# Patient Record
Sex: Female | Born: 1960
Health system: Southern US, Community
[De-identification: ages and names within clinical notes are randomized; demographics above are authoritative.]

## PROBLEM LIST (undated history)

## (undated) DIAGNOSIS — R079 Chest pain, unspecified: Secondary | ICD-10-CM

## (undated) DIAGNOSIS — M329 Systemic lupus erythematosus, unspecified: Secondary | ICD-10-CM

## (undated) DIAGNOSIS — R1310 Dysphagia, unspecified: Secondary | ICD-10-CM

## (undated) DIAGNOSIS — R51 Headache: Secondary | ICD-10-CM

## (undated) DIAGNOSIS — I10 Essential (primary) hypertension: Secondary | ICD-10-CM

## (undated) DIAGNOSIS — F419 Anxiety disorder, unspecified: Secondary | ICD-10-CM

## (undated) DIAGNOSIS — M1990 Unspecified osteoarthritis, unspecified site: Secondary | ICD-10-CM

## (undated) DIAGNOSIS — M7989 Other specified soft tissue disorders: Secondary | ICD-10-CM

## (undated) DIAGNOSIS — G459 Transient cerebral ischemic attack, unspecified: Secondary | ICD-10-CM

## (undated) DIAGNOSIS — R7303 Prediabetes: Secondary | ICD-10-CM

## (undated) DIAGNOSIS — F32A Depression, unspecified: Secondary | ICD-10-CM

## (undated) DIAGNOSIS — R0602 Shortness of breath: Secondary | ICD-10-CM

## (undated) DIAGNOSIS — K5792 Diverticulitis of intestine, part unspecified, without perforation or abscess without bleeding: Secondary | ICD-10-CM

## (undated) DIAGNOSIS — K5900 Constipation, unspecified: Secondary | ICD-10-CM

## (undated) DIAGNOSIS — M797 Fibromyalgia: Secondary | ICD-10-CM

## (undated) DIAGNOSIS — Z8719 Personal history of other diseases of the digestive system: Secondary | ICD-10-CM

## (undated) DIAGNOSIS — K0889 Other specified disorders of teeth and supporting structures: Secondary | ICD-10-CM

## (undated) DIAGNOSIS — R519 Headache, unspecified: Secondary | ICD-10-CM

## (undated) DIAGNOSIS — Z860100 Personal history of colon polyps, unspecified: Secondary | ICD-10-CM

## (undated) DIAGNOSIS — E7401 von Gierke disease: Secondary | ICD-10-CM

## (undated) DIAGNOSIS — M2550 Pain in unspecified joint: Secondary | ICD-10-CM

## (undated) DIAGNOSIS — D75A Glucose-6-phosphate dehydrogenase (G6PD) deficiency without anemia: Secondary | ICD-10-CM

## (undated) DIAGNOSIS — M069 Rheumatoid arthritis, unspecified: Secondary | ICD-10-CM

## (undated) DIAGNOSIS — E7800 Pure hypercholesterolemia, unspecified: Secondary | ICD-10-CM

## (undated) HISTORY — DX: Constipation, unspecified: K59.00

## (undated) HISTORY — DX: Other specified soft tissue disorders: M79.89

## (undated) HISTORY — DX: Systemic lupus erythematosus, unspecified: M32.9

## (undated) HISTORY — DX: Dysphagia, unspecified: R13.10

## (undated) HISTORY — DX: Transient cerebral ischemic attack, unspecified: G45.9

## (undated) HISTORY — DX: Pure hypercholesterolemia, unspecified: E78.00

## (undated) HISTORY — DX: Unspecified osteoarthritis, unspecified site: M19.90

## (undated) HISTORY — PX: COLONOSCOPY: SHX174

## (undated) HISTORY — DX: Prediabetes: R73.03

## (undated) HISTORY — DX: Glucose-6-phosphate dehydrogenase (G6PD) deficiency without anemia: D75.A

## (undated) HISTORY — DX: Personal history of other diseases of the digestive system: Z87.19

## (undated) HISTORY — DX: Diverticulitis of intestine, part unspecified, without perforation or abscess without bleeding: K57.92

## (undated) HISTORY — DX: Depression, unspecified: F32.A

## (undated) HISTORY — DX: Von Gierke disease: E74.01

## (undated) HISTORY — DX: Anxiety disorder, unspecified: F41.9

## (undated) HISTORY — DX: Essential (primary) hypertension: I10

## (undated) HISTORY — DX: Chest pain, unspecified: R07.9

## (undated) HISTORY — DX: Pain in unspecified joint: M25.50

## (undated) HISTORY — DX: Other specified disorders of teeth and supporting structures: K08.89

## (undated) HISTORY — DX: Shortness of breath: R06.02

## (undated) HISTORY — DX: Personal history of colon polyps, unspecified: Z86.0100

## (undated) HISTORY — DX: Personal history of colonic polyps: Z86.010

---

## 1993-04-30 HISTORY — PX: TUBAL LIGATION: SHX77

## 1996-04-30 DIAGNOSIS — M329 Systemic lupus erythematosus, unspecified: Secondary | ICD-10-CM

## 1996-04-30 HISTORY — DX: Systemic lupus erythematosus, unspecified: M32.9

## 1997-09-03 ENCOUNTER — Emergency Department (HOSPITAL_COMMUNITY): Admission: EM | Admit: 1997-09-03 | Discharge: 1997-09-03 | Payer: Self-pay | Admitting: *Deleted

## 1997-12-02 ENCOUNTER — Ambulatory Visit (HOSPITAL_COMMUNITY): Admission: RE | Admit: 1997-12-02 | Discharge: 1997-12-02 | Payer: Self-pay | Admitting: *Deleted

## 1998-06-28 ENCOUNTER — Other Ambulatory Visit: Admission: RE | Admit: 1998-06-28 | Discharge: 1998-06-28 | Payer: Self-pay | Admitting: *Deleted

## 1998-07-13 ENCOUNTER — Ambulatory Visit (HOSPITAL_COMMUNITY): Admission: RE | Admit: 1998-07-13 | Discharge: 1998-07-13 | Payer: Self-pay | Admitting: *Deleted

## 1998-11-17 ENCOUNTER — Ambulatory Visit (HOSPITAL_COMMUNITY): Admission: RE | Admit: 1998-11-17 | Discharge: 1998-11-17 | Payer: Self-pay | Admitting: *Deleted

## 1999-06-21 ENCOUNTER — Other Ambulatory Visit: Admission: RE | Admit: 1999-06-21 | Discharge: 1999-06-21 | Payer: Self-pay | Admitting: *Deleted

## 2004-08-16 ENCOUNTER — Other Ambulatory Visit: Admission: RE | Admit: 2004-08-16 | Discharge: 2004-08-16 | Payer: Self-pay | Admitting: Family Medicine

## 2004-09-03 ENCOUNTER — Emergency Department (HOSPITAL_COMMUNITY): Admission: EM | Admit: 2004-09-03 | Discharge: 2004-09-03 | Payer: Self-pay | Admitting: Emergency Medicine

## 2007-12-24 ENCOUNTER — Ambulatory Visit: Payer: Self-pay | Admitting: Internal Medicine

## 2007-12-24 LAB — CONVERTED CEMR LAB
ALT: 9 units/L (ref 0–35)
AST: 13 units/L (ref 0–37)
Albumin: 4.1 g/dL (ref 3.5–5.2)
Alkaline Phosphatase: 39 units/L (ref 39–117)
BUN: 16 mg/dL (ref 6–23)
Basophils Absolute: 0 10*3/uL (ref 0.0–0.1)
Basophils Relative: 0 % (ref 0–1)
CO2: 23 meq/L (ref 19–32)
Calcium: 9.2 mg/dL (ref 8.4–10.5)
Chloride: 104 meq/L (ref 96–112)
Cholesterol: 170 mg/dL (ref 0–200)
Creatinine, Ser: 0.86 mg/dL (ref 0.40–1.20)
Eosinophils Absolute: 0.1 10*3/uL (ref 0.0–0.7)
Eosinophils Relative: 1 % (ref 0–5)
Glucose, Bld: 88 mg/dL (ref 70–99)
HCT: 35.1 % — ABNORMAL LOW (ref 36.0–46.0)
HDL: 44 mg/dL (ref >39)
Hemoglobin: 10.5 g/dL — ABNORMAL LOW (ref 12.0–15.0)
LDL Cholesterol: 108 mg/dL — ABNORMAL HIGH (ref 0–99)
Lymphocytes Relative: 34 % (ref 12–46)
Lymphs Abs: 1.4 10*3/uL (ref 0.7–4.0)
MCHC: 29.9 g/dL — ABNORMAL LOW (ref 30.0–36.0)
MCV: 82.8 fL (ref 78.0–100.0)
Monocytes Absolute: 0.4 10*3/uL (ref 0.1–1.0)
Monocytes Relative: 8 % (ref 3–12)
Neutro Abs: 2.4 10*3/uL (ref 1.7–7.7)
Neutrophils Relative %: 57 % (ref 43–77)
Platelets: 336 10*3/uL (ref 150–400)
Potassium: 3.6 meq/L (ref 3.5–5.3)
RBC: 4.24 M/uL (ref 3.87–5.11)
RDW: 14.6 % (ref 11.5–15.5)
Sodium: 138 meq/L (ref 135–145)
TSH: 0.92 microintl units/mL (ref 0.350–4.50)
Total Bilirubin: 0.3 mg/dL (ref 0.3–1.2)
Total CHOL/HDL Ratio: 3.9
Total Protein: 8.2 g/dL (ref 6.0–8.3)
Triglycerides: 91 mg/dL (ref <150)
VLDL: 18 mg/dL (ref 0–40)
WBC: 4.2 10*3/uL (ref 4.0–10.5)

## 2008-02-03 ENCOUNTER — Ambulatory Visit: Payer: Self-pay | Admitting: Internal Medicine

## 2008-02-03 LAB — CONVERTED CEMR LAB
Ferritin: 21 ng/mL (ref 10–291)
Folate: 7.8 ng/mL
Iron: 22 ug/dL — ABNORMAL LOW (ref 42–145)
Saturation Ratios: 7 % — ABNORMAL LOW (ref 20–55)
TIBC: 301 ug/dL (ref 250–470)
UIBC: 279 ug/dL
Vitamin B-12: 327 pg/mL (ref 211–911)

## 2008-05-12 ENCOUNTER — Emergency Department (HOSPITAL_COMMUNITY): Admission: EM | Admit: 2008-05-12 | Discharge: 2008-05-12 | Payer: Self-pay | Admitting: Emergency Medicine

## 2008-05-17 ENCOUNTER — Observation Stay (HOSPITAL_COMMUNITY): Admission: EM | Admit: 2008-05-17 | Discharge: 2008-05-19 | Payer: Self-pay | Admitting: Emergency Medicine

## 2008-05-20 ENCOUNTER — Encounter: Payer: Self-pay | Admitting: Internal Medicine

## 2008-05-20 ENCOUNTER — Ambulatory Visit: Payer: Self-pay | Admitting: *Deleted

## 2008-05-20 ENCOUNTER — Ambulatory Visit: Payer: Self-pay | Admitting: Internal Medicine

## 2008-05-20 LAB — CONVERTED CEMR LAB
Chlamydia, DNA Probe: NEGATIVE
GC Probe Amp, Genital: NEGATIVE

## 2008-05-27 ENCOUNTER — Ambulatory Visit: Payer: Self-pay | Admitting: Internal Medicine

## 2008-06-02 ENCOUNTER — Ambulatory Visit: Payer: Self-pay | Admitting: Obstetrics and Gynecology

## 2008-06-11 ENCOUNTER — Ambulatory Visit: Payer: Self-pay | Admitting: Obstetrics and Gynecology

## 2008-06-22 ENCOUNTER — Ambulatory Visit: Payer: Self-pay | Admitting: Internal Medicine

## 2008-06-30 ENCOUNTER — Ambulatory Visit: Payer: Self-pay | Admitting: Obstetrics and Gynecology

## 2008-07-15 ENCOUNTER — Ambulatory Visit: Payer: Self-pay | Admitting: Internal Medicine

## 2008-07-21 ENCOUNTER — Ambulatory Visit (HOSPITAL_COMMUNITY): Admission: RE | Admit: 2008-07-21 | Discharge: 2008-07-21 | Payer: Self-pay | Admitting: Internal Medicine

## 2008-07-22 ENCOUNTER — Ambulatory Visit: Payer: Self-pay | Admitting: Obstetrics and Gynecology

## 2008-09-08 ENCOUNTER — Ambulatory Visit: Payer: Self-pay | Admitting: Family Medicine

## 2008-09-22 ENCOUNTER — Ambulatory Visit: Payer: Self-pay | Admitting: Internal Medicine

## 2008-09-28 HISTORY — PX: ABDOMINAL HYSTERECTOMY: SHX81

## 2008-09-29 ENCOUNTER — Ambulatory Visit: Payer: Self-pay | Admitting: Obstetrics & Gynecology

## 2008-10-06 ENCOUNTER — Ambulatory Visit: Payer: Self-pay | Admitting: Internal Medicine

## 2008-10-12 ENCOUNTER — Ambulatory Visit: Payer: Self-pay | Admitting: Obstetrics & Gynecology

## 2008-10-12 ENCOUNTER — Inpatient Hospital Stay (HOSPITAL_COMMUNITY): Admission: RE | Admit: 2008-10-12 | Discharge: 2008-10-14 | Payer: Self-pay | Admitting: Obstetrics & Gynecology

## 2008-10-12 ENCOUNTER — Encounter: Payer: Self-pay | Admitting: Obstetrics & Gynecology

## 2008-10-20 ENCOUNTER — Ambulatory Visit: Payer: Self-pay | Admitting: Obstetrics & Gynecology

## 2008-11-17 ENCOUNTER — Ambulatory Visit: Payer: Self-pay | Admitting: Obstetrics & Gynecology

## 2009-02-09 ENCOUNTER — Ambulatory Visit: Payer: Self-pay | Admitting: Internal Medicine

## 2009-02-09 LAB — CONVERTED CEMR LAB
BUN: 14 mg/dL (ref 6–23)
Basophils Absolute: 0 10*3/uL (ref 0.0–0.1)
Basophils Relative: 0 % (ref 0–1)
CO2: 24 meq/L (ref 19–32)
Calcium: 9.1 mg/dL (ref 8.4–10.5)
Chloride: 99 meq/L (ref 96–112)
Creatinine, Ser: 0.89 mg/dL (ref 0.40–1.20)
Eosinophils Absolute: 0.1 10*3/uL (ref 0.0–0.7)
Eosinophils Relative: 1 % (ref 0–5)
Glucose, Bld: 110 mg/dL — ABNORMAL HIGH (ref 70–99)
HCT: 39.1 % (ref 36.0–46.0)
Hemoglobin: 12.4 g/dL (ref 12.0–15.0)
Lymphocytes Relative: 32 % (ref 12–46)
Lymphs Abs: 1.9 10*3/uL (ref 0.7–4.0)
MCHC: 31.7 g/dL (ref 30.0–36.0)
MCV: 82.5 fL (ref 78.0–100.0)
Monocytes Absolute: 0.6 10*3/uL (ref 0.1–1.0)
Monocytes Relative: 9 % (ref 3–12)
Neutro Abs: 3.5 10*3/uL (ref 1.7–7.7)
Neutrophils Relative %: 58 % (ref 43–77)
Platelets: 292 10*3/uL (ref 150–400)
Potassium: 3.4 meq/L — ABNORMAL LOW (ref 3.5–5.3)
RBC: 4.74 M/uL (ref 3.87–5.11)
RDW: 14.8 % (ref 11.5–15.5)
Sodium: 137 meq/L (ref 135–145)
WBC: 6.1 10*3/uL (ref 4.0–10.5)

## 2009-02-11 ENCOUNTER — Encounter (INDEPENDENT_AMBULATORY_CARE_PROVIDER_SITE_OTHER): Payer: Self-pay | Admitting: Internal Medicine

## 2009-02-11 LAB — CONVERTED CEMR LAB: Hgb A1c MFr Bld: 6.2 % — ABNORMAL HIGH (ref 4.6–6.1)

## 2009-06-29 ENCOUNTER — Ambulatory Visit: Payer: Self-pay | Admitting: Internal Medicine

## 2009-07-26 ENCOUNTER — Ambulatory Visit: Payer: Self-pay | Admitting: Internal Medicine

## 2009-07-29 ENCOUNTER — Ambulatory Visit: Payer: Self-pay | Admitting: Internal Medicine

## 2009-11-28 ENCOUNTER — Encounter (INDEPENDENT_AMBULATORY_CARE_PROVIDER_SITE_OTHER): Payer: Self-pay | Admitting: Family Medicine

## 2009-11-28 ENCOUNTER — Ambulatory Visit: Payer: Self-pay | Admitting: Internal Medicine

## 2009-11-28 LAB — CONVERTED CEMR LAB: Microalb, Ur: 1.27 mg/dL (ref 0.00–1.89)

## 2010-01-30 ENCOUNTER — Encounter (INDEPENDENT_AMBULATORY_CARE_PROVIDER_SITE_OTHER): Payer: Self-pay | Admitting: Internal Medicine

## 2010-01-30 LAB — CONVERTED CEMR LAB
ALT: 12 units/L (ref 0–35)
AST: 14 units/L (ref 0–37)
Albumin: 4.1 g/dL (ref 3.5–5.2)
Alkaline Phosphatase: 36 units/L — ABNORMAL LOW (ref 39–117)
BUN: 13 mg/dL (ref 6–23)
Basophils Absolute: 0 10*3/uL (ref 0.0–0.1)
Basophils Relative: 1 % (ref 0–1)
CO2: 26 meq/L (ref 19–32)
Calcium: 9.4 mg/dL (ref 8.4–10.5)
Chloride: 101 meq/L (ref 96–112)
Creatinine, Ser: 0.86 mg/dL (ref 0.40–1.20)
Eosinophils Absolute: 0 10*3/uL (ref 0.0–0.7)
Eosinophils Relative: 1 % (ref 0–5)
Glucose, Bld: 91 mg/dL (ref 70–99)
HCT: 40 % (ref 36.0–46.0)
Hemoglobin: 13.2 g/dL (ref 12.0–15.0)
Hgb A1c MFr Bld: 5.9 % — ABNORMAL HIGH (ref <5.7)
Lymphocytes Relative: 32 % (ref 12–46)
Lymphs Abs: 1.4 10*3/uL (ref 0.7–4.0)
MCHC: 33 g/dL (ref 30.0–36.0)
MCV: 86.2 fL (ref 78.0–100.0)
Monocytes Absolute: 0.4 10*3/uL (ref 0.1–1.0)
Monocytes Relative: 8 % (ref 3–12)
Neutro Abs: 2.6 10*3/uL (ref 1.7–7.7)
Neutrophils Relative %: 59 % (ref 43–77)
Platelets: 332 10*3/uL (ref 150–400)
Potassium: 3.9 meq/L (ref 3.5–5.3)
RBC: 4.64 M/uL (ref 3.87–5.11)
RDW: 13.6 % (ref 11.5–15.5)
Sodium: 138 meq/L (ref 135–145)
TSH: 0.866 microintl units/mL (ref 0.350–4.500)
Total Bilirubin: 0.5 mg/dL (ref 0.3–1.2)
Total Protein: 7.6 g/dL (ref 6.0–8.3)
WBC: 4.3 10*3/uL (ref 4.0–10.5)

## 2010-04-26 ENCOUNTER — Encounter (INDEPENDENT_AMBULATORY_CARE_PROVIDER_SITE_OTHER): Payer: Self-pay | Admitting: Family Medicine

## 2010-04-26 LAB — CONVERTED CEMR LAB
HCT: 40.5 % (ref 36.0–46.0)
Hemoglobin: 13.4 g/dL (ref 12.0–15.0)
MCHC: 33.1 g/dL (ref 30.0–36.0)
MCV: 84.2 fL (ref 78.0–100.0)
Platelets: 394 10*3/uL (ref 150–400)
RBC: 4.81 M/uL (ref 3.87–5.11)
RDW: 12.8 % (ref 11.5–15.5)
WBC: 5.7 10*3/uL (ref 4.0–10.5)

## 2010-08-07 LAB — CBC
HCT: 23.3 % — ABNORMAL LOW (ref 36.0–46.0)
HCT: 23.4 % — ABNORMAL LOW (ref 36.0–46.0)
HCT: 25.1 % — ABNORMAL LOW (ref 36.0–46.0)
HCT: 36.5 % (ref 36.0–46.0)
Hemoglobin: 8.2 g/dL — ABNORMAL LOW (ref 12.0–15.0)
Hemoglobin: 12.6 g/dL (ref 12.0–15.0)
Hemoglobin: 8.1 g/dL — ABNORMAL LOW (ref 12.0–15.0)
Hemoglobin: 8.6 g/dL — ABNORMAL LOW (ref 12.0–15.0)
MCHC: 35.2 g/dL (ref 30.0–36.0)
MCHC: 34.3 g/dL (ref 30.0–36.0)
MCHC: 34.5 g/dL (ref 30.0–36.0)
MCHC: 34.5 g/dL (ref 30.0–36.0)
MCV: 85.8 fL (ref 78.0–100.0)
MCV: 85.4 fL (ref 78.0–100.0)
MCV: 86 fL (ref 78.0–100.0)
MCV: 86.6 fL (ref 78.0–100.0)
Platelets: 210 10*3/uL (ref 150–400)
Platelets: 188 10*3/uL (ref 150–400)
Platelets: 221 10*3/uL (ref 150–400)
Platelets: 250 10*3/uL (ref 150–400)
RBC: 2.72 MIL/uL — ABNORMAL LOW (ref 3.87–5.11)
RBC: 2.72 MIL/uL — ABNORMAL LOW (ref 3.87–5.11)
RBC: 2.91 MIL/uL — ABNORMAL LOW (ref 3.87–5.11)
RBC: 4.28 MIL/uL (ref 3.87–5.11)
RDW: 14.8 % (ref 11.5–15.5)
RDW: 14.7 % (ref 11.5–15.5)
RDW: 14.8 % (ref 11.5–15.5)
RDW: 14.9 % (ref 11.5–15.5)
WBC: 14.1 10*3/uL — ABNORMAL HIGH (ref 4.0–10.5)
WBC: 21.4 10*3/uL — ABNORMAL HIGH (ref 4.0–10.5)
WBC: 5.1 10*3/uL (ref 4.0–10.5)
WBC: 8.4 10*3/uL (ref 4.0–10.5)

## 2010-08-07 LAB — CROSSMATCH
ABO/RH(D): A POS
Antibody Screen: NEGATIVE

## 2010-08-07 LAB — BASIC METABOLIC PANEL
BUN: 9 mg/dL (ref 6–23)
CO2: 27 mEq/L (ref 19–32)
Calcium: 9.3 mg/dL (ref 8.4–10.5)
Chloride: 103 mEq/L (ref 96–112)
Creatinine, Ser: 0.9 mg/dL (ref 0.4–1.2)
GFR calc Af Amer: >60 mL/min (ref >60)
GFR calc non Af Amer: >60 mL/min (ref >60)
Glucose, Bld: 119 mg/dL — ABNORMAL HIGH (ref 70–99)
Potassium: 3.3 mEq/L — ABNORMAL LOW (ref 3.5–5.1)
Sodium: 136 mEq/L (ref 135–145)

## 2010-08-07 LAB — ABO/RH: ABO/RH(D): A POS

## 2010-08-07 LAB — PREGNANCY, URINE: Preg Test, Ur: NEGATIVE

## 2010-08-14 LAB — IRON AND TIBC
Iron: 114 ug/dL (ref 42–135)
Iron: 89 ug/dL (ref 42–135)
Saturation Ratios: 29 % (ref 20–55)
Saturation Ratios: 38 % (ref 20–55)
TIBC: 300 ug/dL (ref 250–470)
TIBC: 310 ug/dL (ref 250–470)
UIBC: 186 ug/dL
UIBC: 221 ug/dL

## 2010-08-14 LAB — CROSSMATCH
ABO/RH(D): A POS
Antibody Screen: NEGATIVE

## 2010-08-14 LAB — CBC
HCT: 29.5 % — ABNORMAL LOW (ref 36.0–46.0)
HCT: 23.1 % — ABNORMAL LOW (ref 36.0–46.0)
HCT: 25.9 % — ABNORMAL LOW (ref 36.0–46.0)
HCT: 28.6 % — ABNORMAL LOW (ref 36.0–46.0)
HCT: 30.9 % — ABNORMAL LOW (ref 36.0–46.0)
Hemoglobin: 9.5 g/dL — ABNORMAL LOW (ref 12.0–15.0)
Hemoglobin: 10 g/dL — ABNORMAL LOW (ref 12.0–15.0)
Hemoglobin: 7.4 g/dL — CL (ref 12.0–15.0)
Hemoglobin: 8.6 g/dL — ABNORMAL LOW (ref 12.0–15.0)
Hemoglobin: 9.4 g/dL — ABNORMAL LOW (ref 12.0–15.0)
MCHC: 32.1 g/dL (ref 30.0–36.0)
MCHC: 32.2 g/dL (ref 30.0–36.0)
MCHC: 32.5 g/dL (ref 30.0–36.0)
MCHC: 32.7 g/dL (ref 30.0–36.0)
MCHC: 33.3 g/dL (ref 30.0–36.0)
MCV: 84.1 fL (ref 78.0–100.0)
MCV: 85.5 fL (ref 78.0–100.0)
MCV: 86 fL (ref 78.0–100.0)
MCV: 87.3 fL (ref 78.0–100.0)
MCV: 87.7 fL (ref 78.0–100.0)
Platelets: 318 10*3/uL (ref 150–400)
Platelets: 364 10*3/uL (ref 150–400)
Platelets: 364 10*3/uL (ref 150–400)
Platelets: 382 10*3/uL (ref 150–400)
Platelets: 395 10*3/uL (ref 150–400)
RBC: 3.51 MIL/uL — ABNORMAL LOW (ref 3.87–5.11)
RBC: 2.71 MIL/uL — ABNORMAL LOW (ref 3.87–5.11)
RBC: 3.01 MIL/uL — ABNORMAL LOW (ref 3.87–5.11)
RBC: 3.26 MIL/uL — ABNORMAL LOW (ref 3.87–5.11)
RBC: 3.54 MIL/uL — ABNORMAL LOW (ref 3.87–5.11)
RDW: 14.4 % (ref 11.5–15.5)
RDW: 15 % (ref 11.5–15.5)
RDW: 15 % (ref 11.5–15.5)
RDW: 15.3 % (ref 11.5–15.5)
RDW: 15.4 % (ref 11.5–15.5)
WBC: 7.4 10*3/uL (ref 4.0–10.5)
WBC: 6.3 10*3/uL (ref 4.0–10.5)
WBC: 7.4 10*3/uL (ref 4.0–10.5)
WBC: 7.8 10*3/uL (ref 4.0–10.5)
WBC: 7.9 10*3/uL (ref 4.0–10.5)

## 2010-08-14 LAB — BASIC METABOLIC PANEL
BUN: 7 mg/dL (ref 6–23)
BUN: 9 mg/dL (ref 6–23)
CO2: 23 mEq/L (ref 19–32)
CO2: 26 mEq/L (ref 19–32)
Calcium: 8.6 mg/dL (ref 8.4–10.5)
Calcium: 8.8 mg/dL (ref 8.4–10.5)
Chloride: 106 mEq/L (ref 96–112)
Chloride: 108 mEq/L (ref 96–112)
Creatinine, Ser: 0.83 mg/dL (ref 0.4–1.2)
Creatinine, Ser: 0.9 mg/dL (ref 0.4–1.2)
GFR calc Af Amer: >60 mL/min (ref >60)
GFR calc Af Amer: >60 mL/min (ref >60)
GFR calc non Af Amer: >60 mL/min (ref >60)
GFR calc non Af Amer: >60 mL/min (ref >60)
Glucose, Bld: 92 mg/dL (ref 70–99)
Glucose, Bld: 96 mg/dL (ref 70–99)
Potassium: 3.6 mEq/L (ref 3.5–5.1)
Potassium: 3.7 mEq/L (ref 3.5–5.1)
Sodium: 137 mEq/L (ref 135–145)
Sodium: 137 mEq/L (ref 135–145)

## 2010-08-14 LAB — DIFFERENTIAL
Basophils Absolute: 0 10*3/uL (ref 0.0–0.1)
Basophils Relative: 0 % (ref 0–1)
Eosinophils Absolute: 0 10*3/uL (ref 0.0–0.7)
Eosinophils Relative: 0 % (ref 0–5)
Lymphocytes Relative: 13 % (ref 12–46)
Lymphs Abs: 0.9 10*3/uL (ref 0.7–4.0)
Monocytes Absolute: 0.3 10*3/uL (ref 0.1–1.0)
Monocytes Relative: 4 % (ref 3–12)
Neutro Abs: 6.2 10*3/uL (ref 1.7–7.7)
Neutrophils Relative %: 83 % — ABNORMAL HIGH (ref 43–77)

## 2010-08-14 LAB — COMPREHENSIVE METABOLIC PANEL
ALT: 11 U/L (ref 0–35)
AST: 20 U/L (ref 0–37)
Albumin: 3.1 g/dL — ABNORMAL LOW (ref 3.5–5.2)
Alkaline Phosphatase: 33 U/L — ABNORMAL LOW (ref 39–117)
BUN: 8 mg/dL (ref 6–23)
CO2: 25 mEq/L (ref 19–32)
Calcium: 8.5 mg/dL (ref 8.4–10.5)
Chloride: 105 mEq/L (ref 96–112)
Creatinine, Ser: 0.86 mg/dL (ref 0.4–1.2)
GFR calc Af Amer: >60 mL/min (ref >60)
GFR calc non Af Amer: >60 mL/min (ref >60)
Glucose, Bld: 111 mg/dL — ABNORMAL HIGH (ref 70–99)
Potassium: 2.9 mEq/L — ABNORMAL LOW (ref 3.5–5.1)
Sodium: 138 mEq/L (ref 135–145)
Total Bilirubin: 0.7 mg/dL (ref 0.3–1.2)
Total Protein: 6.7 g/dL (ref 6.0–8.3)

## 2010-08-14 LAB — RETICULOCYTES
RBC.: 2.78 MIL/uL — ABNORMAL LOW (ref 3.87–5.11)
RBC.: 3.17 MIL/uL — ABNORMAL LOW (ref 3.87–5.11)
Retic Count, Absolute: 152.2 10*3/uL (ref 19.0–186.0)
Retic Count, Absolute: 158.5 10*3/uL (ref 19.0–186.0)
Retic Ct Pct: 4.8 % — ABNORMAL HIGH (ref 0.4–3.1)
Retic Ct Pct: 5.7 % — ABNORMAL HIGH (ref 0.4–3.1)

## 2010-08-14 LAB — HEMOCCULT GUIAC POC 1CARD (OFFICE): Fecal Occult Bld: NEGATIVE

## 2010-08-14 LAB — FOLATE
Folate: 7.1 ng/mL
Folate: 7.3 ng/mL

## 2010-08-14 LAB — GLUCOSE, CAPILLARY: Glucose-Capillary: 101 mg/dL — ABNORMAL HIGH (ref 70–99)

## 2010-08-14 LAB — FERRITIN
Ferritin: 19 ng/mL (ref 10–291)
Ferritin: 20 ng/mL (ref 10–291)

## 2010-08-14 LAB — VITAMIN B12
Vitamin B-12: 311 pg/mL (ref 211–911)
Vitamin B-12: 339 pg/mL (ref 211–911)

## 2010-08-14 LAB — SAMPLE TO BLOOD BANK

## 2010-08-14 LAB — PROTIME-INR
INR: 1.1 (ref 0.00–1.49)
Prothrombin Time: 14.1 seconds (ref 11.6–15.2)

## 2010-08-14 LAB — APTT: aPTT: 29 seconds (ref 24–37)

## 2010-08-14 LAB — ABO/RH: ABO/RH(D): A POS

## 2010-08-14 LAB — POCT PREGNANCY, URINE: Preg Test, Ur: NEGATIVE

## 2010-08-14 LAB — MAGNESIUM: Magnesium: 2.2 mg/dL (ref 1.5–2.5)

## 2010-08-15 LAB — POCT PREGNANCY, URINE
Preg Test, Ur: NEGATIVE
Preg Test, Ur: NEGATIVE

## 2010-09-12 NOTE — Discharge Summary (Signed)
NAMEYOLTZIN, RANSOM NO.:  [id number]   MEDICAL RECORD NO.:  [id number]          PATIENT TYPE:  INP   LOCATION:  9302                          [facility]:  [facility]   PHYSICIAN:  Scheryl Darter, MD       DATE OF BIRTH:  January 13, 1961   DATE OF ADMISSION:  10/12/2008  DATE OF DISCHARGE:  10/14/2008                               DISCHARGE SUMMARY   DIAGNOSIS:  Symptomatic fibroid uterus.   PROCEDURE:  On October 12, 2008, was total abdominal hysterectomy.   The patient is a 50 year old black female gravida 2, para 2.  Last  menstrual period in January 2010, and history of heavy periods lasting  up to 10 days.  She had an episode of severe anemia and she was admitted  on  May 18, 2008, at [facility] with heavy  bleeding and low hemoglobin.  She was transfused 3 units of packed red  blood cells and was discharged with a hemoglobin of 9.  Ultrasound  confirmed a fibroid uterus.  There is no history of hypertension.   PAST SURGICAL HISTORY:  Two cesarean sections and tubal ligation.   ALLERGIES:  No known drug allergies.   MEDICATIONS:  Lisinopril 20 mg and hydrochlorothiazide 25 mg 1 p.o.  daily, Toprol-XL 25 mg a day, Tiazac 360 mg a day, Provera 5 mg a day.  The patient received Lupron Depot 11.25 mg in February 2010.   FAMILY HISTORY:  Diabetes and hypertension.   SOCIAL HISTORY:  The patient is married, does not smoke, and denies  alcohol, tobacco, or drug use.   REVIEW OF SYSTEMS:  No vaginal bleeding at admission.   PHYSICAL EXAMINATION:  VITAL SIGNS:  Weight 220 pounds, blood pressure  145/95.  CHEST:  Clear.  HEART:  Regular rate and rhythm.  ABDOMEN:  Soft, nontender, no mass.  PELVIC:  About a 12 to 14 week size uterus.  Ultrasound showed large  uterus 15.9 x 7.4 x 9.2 cm with multiple leiomyoma.   The patient underwent total abdominal hysterectomy on October 12, 2008.  She had estimated blood loss of about 1500 mL.  Her hemoglobin postop  was stable at 8.2 and she required no transfusions.  Vital signs were  stable throughout her recovery.  She was afebrile.  She was advancing  her diet without problems.  The patient was discharged home on second  postoperative day.  She was continued with her medications from prior to  admission.  She will take Slow FE  1 p.o. daily.  She had a prescription for Percocet 5/325 30 tablets 1-2  p.o. q.4-6 hours p.r.n. pain.  She was instructed on pelvic rest, no  heavy lifting.  She will follow up a week after discharge for removal of  her staples at the [facility].  The patient was not to drive for  about 2 weeks.      Scheryl Darter, MD  Electronically Signed     JA/MEDQ  D:  10/14/2008  T:  10/14/2008  Job:  [id number]

## 2010-09-12 NOTE — H&P (Signed)
NAMEVESTAL, DELVAL NO.:  [id number]   MEDICAL RECORD NO.:  [id number]          PATIENT TYPE:  INP   LOCATION:  2008                         FACILITY:  [facility]   PHYSICIAN:  Michiel Cowboy, MDDATE OF BIRTH:  [birth year]   DATE OF ADMISSION:  05/17/2008  DATE OF DISCHARGE:                              HISTORY & PHYSICAL   PRIMARY CARE Yuriana Gaal:  Gretta Arab. Valentina Lucks, M.D.   CHIEF COMPLAINT:  Generalized weakness and tiredness.   HISTORY OF THE PRESENT ILLNESS:  The patient is a 50 year old female who  was recently seen in the emergency department secondary syncope about  five days ago.  At that time her hemoglobin was around 9.5.  She  presents again today with generalized weakness and lightheadedness and  her hemoglobin was down to 7.4.  The patient endorses a recent vaginal  heavy bleeding and states this has slowed down.  In the past she  reported heavy blood clots an d stats she needed to wear up to three  pads at a time to prevent the bleeding from soaking through her clothes.  Now she is just spotting; this has been going on for quite some time, I  think maybe months.  She does not have an Chief Financial Officer doctor.  She has been  seeing Dr. Maurice Small for about a year.  She does take an iron  supplement, but is not on medications.   PAST MEDICAL HISTORY:  The past medical history is unremarkable, except  for recent near syncope, which is reportedly secondary to symptomatic  anemia.   SOCIAL HISTORY:  The patient does not smoke or drink alcohol.  She lives  at home.   FAMILY HISTORY:  No history bleeding disorders in the family.   MEDICATIONS:  Ferrous sulfate once a day 325 mg.   ALLERGIES:  No known drug allergies.   PHYSICAL EXAMINATION:  VITAL SIGNS:  Temperature 97.7, blood pressure  149/124, pulse 57, respirations 18, and satting 100% on room air.  GENERAL APPEARANCE:  The appears to be in no acute distress.  HEENT:  Head is atraumatic, except for  a healing scratch on her  forehead.  Moist mucous membranes, but somewhat pale.  LUNGS:  The lungs are clear to auscultation bilaterally.  Somewhat  distant.  HEART:  The heart has a regular rate and rhythm.  No murmurs, rubs or  gallops.  ABD MEN:  The abdomen is obese, but nontender and nondistended.  EXTREMITIES:  The lower extremities are without clubbing, cyanosis or  edema.  NEUROLOGIC EXAMINATION:  The patient is neurologically intact.   LABORATORY DATA:  White blood cell count 7.4, hemoglobin 7.4 and  platelets 395,000.  Coags and CMP not obtained. Hemoccult negative from  below.  CT scan of the head today showed no intracranial process.   ASSESSMENT AND PLAN:  This is a 50 year old female with symptomatic  anemia likely related to her vaginal bleeding.   1. Anemia.  We will transfuse 2 units of packed red blood cells and      repeat complete blood count thereafter.  We will try to  add on an      anemia panel.  The patient will need an OB-Gyn follow up.  We will      obtain a vaginal ultrasound.  We will obtain coagulation studies      and comprehensive metabolic profile to evaluate for any other      potential causes for underlying bleeding disorders, but, I doubt      and just the main cause is just vaginal bleeding.  She is hemoccult      negative times one, but we will cycle times three.  2. Prophylaxes.  Protonix plus sequential compressive devices.      Michiel Cowboy, MD  Electronically Signed     AVD/MEDQ  D:  05/17/2008  T:  05/18/2008  Job:  [id number]   cc:   Gretta Arab. Valentina Lucks, M.D.

## 2010-09-12 NOTE — H&P (Signed)
NAMELINDALOU, MASLOSKI NO.:  [id number]   MEDICAL RECORD NO.:  [id number]          PATIENT TYPE:  INP   LOCATION:  9302                          [facility]   PHYSICIAN:  Scheryl Darter, MD       DATE OF BIRTH:  11-13-1960   DATE OF ADMISSION:  10/12/2008  DATE OF DISCHARGE:                              HISTORY & PHYSICAL   CHIEF COMPLAINT:  Heavy periods.   DIAGNOSIS:  Fibroid uterus.   PLANNED PROCEDURE:  Total abdominal hysterectomy.   HISTORY OF PRESENT ILLNESS:  The patient is a 50 year old black female  gravida 2, para 2, last menstrual period on January 2010 who has had  heavy periods that last about 10 days and her cycle is about 21 days.  On May 18, 2008, she was admitted to [facility] due to being weak  and having heavy menstrual bleeding.  She was transfused 3 units of  blood secondary to anemia.  When she was discharged, her hemoglobin was  about 9.  It had been as low as 7.2.  She was referred to Gynecologic  Clinic.  Ultrasound confirmed a fibroid uterus.   PAST MEDICAL HISTORY:  Hypertension.   PAST SURGICAL HISTORY:  Two cesarean sections and tubal ligation.   ALLERGIES:  No known drug allergies.   MEDICATIONS:  1. Lisinopril/hydrochlorothiazide 20/25 one p.o. daily.  2. Toprol-XL 25 mg a day.  3. Tiazac 360 mg a day.  4. Provera 5 mg a day.  5. The patient received Lupron Depot 11.25 mg in February 2010.   FAMILY HISTORY:  Diabetes and hypertension.   SOCIAL HISTORY:  The patient is married and she is nonsmoker and she  denies alcohol or drug use.   REVIEW OF SYSTEMS:  No bleeding since January 2010.  She has had some  vasomotor symptoms.   PHYSICAL EXAMINATION:  GENERAL:  The patient in no acute distress.  Mood  and affect is normal.  VITAL SIGNS:  Weight is 220 pounds, blood pressure 145/95, afebrile.  CHEST:  Clear.  HEART:  Regular rate and rhythm.  ABDOMEN:  Soft and nontender.  No mass.  PELVIC:  Done on June 02, 2008, showed normal external genitalia and  vagina.  Uterus about 12-14 week size.   Ultrasound on May 18, 2008, showed a large uterus 15.9 x 7.4 x 9.2  cm, multiple intramural uterine leiomyomata.  Two cysts within the left  ovary measuring up to 1.2 cm, right ovary has cyst measuring 1.8 x 1.5 x  2 cm.   IMPRESSION:  Symptomatic fibroid uterus, history of menorrhagia and  anemia.   PLAN:  The patient is scheduled for total abdominal hysterectomy.  Procedure was explained.  The risks of anesthesia, complications,  bleeding, infection, bowel, urinary tract damage discussed.  We also  discussed preservation of her ovaries versus oophorectomy, and she would  like to have her ovaries preserved unless there is indication for  removal at the time of surgery.  Questions were answered.      Scheryl Darter, MD  Electronically Signed     JA/MEDQ  D:  10/12/2008  T:  10/13/2008  Job:  [id number]

## 2010-09-12 NOTE — Group Therapy Note (Signed)
NAMEMarland [name]  CHIANNE, BYRNS NO.:  [id number]   MEDICAL RECORD NO.:  [id number]          PATIENT TYPE:  WOC   LOCATION:  [facility]                   FACILITY:  [facility]   PHYSICIAN:  Scheryl Darter, MD       DATE OF BIRTH:  1960-08-01   DATE OF SERVICE:  07/22/2008                                  CLINIC NOTE   The patient has done well after receiving Lupron Depo due to fibroid  uterus and anemia.  Her last bleeding was at the beginning of February.  She now also takes Provera 5 mg a day and has helped to the decrease  vasomotor symptoms.  She feels ready to schedule total abdominal  hysterectomy.  Discussed the surgery and the risks of bleeding,  infection, bowel or urinary tract damage, anesthesia complications.  Questions were answered.   CURRENT MEDICATIONS:  1. Lisinopril/hydrochlorothiazide 20/25 one p.o. daily.  2. Toprol-XL 25 mg a day.  3. Hydrochlorothiazide 25 mg a day.  4. Feosol 325 mg a day.  5. Stool softener once a day.  6. __________ 120 mg a day.  7. Provera 5 mg a day.   She has no known drug allergies, and she is not allergic to Latex.   REVIEW OF SYSTEMS:  No bleeding.  No complaints of pain.  She had a cold  starting about 2 weeks ago and still has some chest congestion.  No  fevers.   PHYSICAL EXAMINATION:  CHEST:  Is clear.  HEART:  Regular rhythm.  ABDOMEN:  Is moderately obese, soft, nontender, well healed, transverse  lower abdominal incision below her pannus.  PELVIC EXAM:  Was deferred today.   IMPRESSION:  1. Fibroid uterus.  2. Menorrhagia.  3. Anemia.   PLAN:  Total abdominal hysterectomy.     Scheryl Darter, MD    JA/MEDQ  D:  07/22/2008  T:  07/22/2008  Job:  [id number]

## 2010-09-12 NOTE — Group Therapy Note (Signed)
NAME:  Veronica [name], VINER NO.:  [id number]   MEDICAL RECORD NO.:  [id number]          PATIENT TYPE:  WOC   LOCATION:  [facility]                   FACILITY[facility]  [facility]   PHYSICIAN:  Scheryl Darter, MD       DATE OF BIRTH:  1960/10/19   DATE OF SERVICE:                                  CLINIC NOTE   CHIEF COMPLAINT:  Heavy periods.   Patient is a 50 year old black female, gravida 2, para 2.  Last  menstrual period May 10, 2008 which was heavy and lasted 10 days.  She says she has heavy periods for about 5 days of the cycle and then  her periods usually last about 10 days.  She usually has a period every  month but occasionally she has 21-day cycles.  She was admitted at [facility] on May 18, 2008 when she was brought by EMS due to being weak  and having heavy menstrual bleeding.  She was transfused 3 units of  blood secondary to anemia.  She says when she was discharged her  hemoglobin was about 9.  No bleeding now.  She was told she had a  fibroid uterus.   PAST MEDICAL HISTORY:  Hypertension.   PAST SURGICAL HISTORY:  Two  cesarean sections.  Tubal ligation.'   No known drug allergies.   MEDICATIONS:  1. Lisinopril/hydrochlorothiazide 20/25 one p.o. daily.  2. Toprol XL 25 mg a day.  3. Hydrochlorothiazide 25 mg a day.  4. FerrouSul 325 mg a day.  5. Stool softener 1 a day.  6. Tiazole 120 mg a day.   FAMILY HISTORY:  Diabetes and hypertension.  Patient is a nonsmoker.  She denies alcohol and drug use.   PHYSICAL EXAMINATION:  Patient in no acute distress.  Weight is 220  pounds.  Blood pressure 134/84, pulse 80, temperature 98.7.  ABDOMEN:  Obese, soft, nontender.  PELVIC EXAM:  External genitalia, vagina, cervix normal.  Uterus is  about 12- to 14-weeks size with no discrete adnexal masses.  Ultrasound  done May 18, 2008 showed enlarged uterus 15.9 x 7.4 x 9.2 with  multiple intramural uterine leiomyomata, the largest measuring 4.6 x 5.2  x 4.4  cm.  The endometrial density cannot be assessed because of this.  With 2 cysts within the left ovary measuring up to 1.2 cm.  The right  ovary had a cyst measuring 1.8 x 1.5 x 2.0 cm.   IMPRESSION:  Symptomatic uterine fibroids with menorrhagia and anemia.   PLAN:  I discussed hormonal management versus surgical management.  Because of the size of her uterus and nature of the fibroids, I have  recommended total abdominal hysterectomy.  We discussed possible removal  of her ovaries and she would like to spare her ovaries at the time of  surgery.  Discussed the procedure.  Because of her anemia, she will  receive Lupron Depo 11.25 mg IM preop and she will continue with her  iron supplement.  She will return to clinic in 4 weeks.      Scheryl Darter, MD     JA/MEDQ  D:  06/02/2008  T:  06/02/2008  Job:  [id number]

## 2010-09-12 NOTE — Op Note (Signed)
Veronica [name], Veronica [name] NO.:  [id number]   MEDICAL RECORD NO.:  [id number]          PATIENT TYPE:  INP   LOCATION:  9302                          [facility]ILITY[facility]   PHYSICIAN:  Scheryl Darter, MD       DATE OF BIRTH:  [birth year]   DATE OF PROCEDURE:  10/12/2008  DATE OF DISCHARGE:                               OPERATIVE REPORT   PROCEDURE:  Total abdominal hysterectomy.   PREOPERATIVE DIAGNOSIS:  Symptomatic fibroid uterus.   POSTOPERATIVE DIAGNOSIS:  Symptomatic fibroid uterus.   SURGEON:  Scheryl Darter, MD   ASSISTANT:  Javier Glazier. Rose, MD   ANESTHESIA:  General.   ESTIMATED BLOOD LOSS:  1500 mL.   SPECIMEN:  Uterus.   COMPLICATIONS:  Interrupted blood loss.   DRAINS:  Foley catheter.   COUNTS:  Correct.   OPERATIVE COURSE:  The patient gave written consent for total abdominal  hysterectomy due to symptomatic fibroid uterus.  The patient  identification was confirmed.  She was brought to the OR and general  anesthesia was induced.  She was placed in dorsal supine position.  Exam  revealed about a 10-12 weeks' size uterus.  Abdomen was sterilely  prepped and draped.  Foley catheter was placed.  A #10 blade was used to  make a Pfannenstiel incision inside her previous cesarean section scars.  Incision was carried down to the fascia and fascia was incised.  The  incision was extended transversely with curved Mayo scissors.  There was  considerable amount of scarring in the fascial layer.  This was taken  down with blunt sharp dissection.  The peritoneum was entered and  incision was extended vertically.  There was adhesions of the omentum to  anterior abdominal wall.  This was taken down with sharp dissection with  cautery and some larger adhesions were cross-clamped with Kelly clamps,  cut and ligated with 0 Vicryl.  The adhesions also extended into the  pelvis.  Abdominal cavity was then explored and there were no upper  abdominal masses.  Uterus appeared  to be irregular, consistent with  fibroids and there were two subserosal fibroids seen.  Both ovaries  appeared normal.  The bowel was packed back with moist lap pads and a  self-retaining retractor was placed.  Uterus was elevated by grasping  with towel clips.  The round ligaments were identified and clamped with  Kelly clamps and cut and suture ligated.  The anterior leaf of the broad  ligament was opened across the anterior uterus with Metzenbaum scissors.  Adhesions of the bladder to the anterior uterus were taken down with  blunt and sharp dissection.  The posterior leaf of broad ligament was  entered and Kelly clamps were placed distal to the ovaries across the  adnexal pedicles.  Next, pedicles were cut and double-suture ligatures  were placed.  Good hemostasis seen.  Bladder was further dissected off  the uterus and uterine vessels were skeletonized.  The uterine vessels  were double clamped with Heaney clamps and cut and suture ligated.  There was some bleeding on the left side and this  was controlled by  fully taking down the uterine vessels on the left and suture ligating  them with double ligatures.  There was a fair significant blood loss  during this time.  The ureters were palpated and were out of the  surgical field.  Once hemostasis was controlled, hemoglobin was  obtained, it was 8.1.  Estimated blood loss at that point was about 1200  mL.  The patient was typed and crossmatched for 2 units of blood, but  she did not receive this during the procedure.  She remained  hemodynamically stable with fluid resuscitation during the procedure.  The uterus was amputated above the cervix using the knife and scissors  in order to obtain better visualization of the vessels.  The cardinal  ligaments were clamped, cut, and suture ligated.  The bladder was slowly  reflected away from the cervix.  As her cervix was deep in the pelvis,  the cervix was cored out using scissors.  Clamps  were then placed distal  to the remaining cervix and cervix was removed and suture ligatures were  placed at the vaginal angles.  The vagina was closed with running  locking suture with 0 Vicryl.  Hemostasis was obtained at the cuff with  0 Vicryl.  The hemostasis was assured and pelvis was irrigated.  All  packs and retractors were removed.  Anterior peritoneum was closed with  a running suture of 2-0 Vicryl.  Fascia was closed with running suture  with 0 Vicryl.  Hemostasis was assured.  Interrupted 0-Vicryl sutures  were placed in Scarpa fascia to close the subcutaneous layer.  Skin was  closed with staples.  The skin was irrigated prior to closure.  The  hemostasis was seen in the incision.  Sterile dressing was applied.  The  patient tolerated the procedure well.  She was stable at the end of  procedure and she was brought to the recovery room.  A CBC was ordered  in the recovery room which is currently pending.  Estimated blood loss  was 1500 mL.  She had good urine output about 350 mL.      Scheryl Darter, MD  Electronically Signed     JA/MEDQ  D:  10/12/2008  T:  10/13/2008  Job:  [id number]

## 2010-09-12 NOTE — Group Therapy Note (Signed)
NAMEJERNEY, BAKSH NO.:  [id number]   MEDICAL RECORD NO.:  [id number]          PATIENT TYPE:  WOC   LOCATION:  [facility]                   FACILITY[facility]  [facility]   PHYSICIAN:  Allie Bossier, MD        DATE OF BIRTH:  22-Apr-1961   DATE OF SERVICE:  09/29/2008                                  CLINIC NOTE   Ms. Baptista is a 50 year old lady who has come in to discuss her  upcoming abdominal hysterectomy.  Dr. Debroah Loop has been seeing this  patient for heavy periods and she was recently given a Depo-Lupron  injection on Sep 08, 2008.  Her abdominal hysterectomy is scheduled for  October 12, 2008 and she had several questions.  Her first question is that  of will the incision be vertical or transverse.  Since I am not doing  the surgery I an unable to answer that.  Her next question was that  about her ovaries.  She said that she is currently having hot flashes  but is uncertain as to whether she would like to have her ovaries out.  I have discussed the pros and cons of removing ovaries versus leaving  them in situ and she will give Dr. Debroah Loop her preference on the day of  surgery.  All of the questions were answered.  I did explain the risks  of surgery.  Odetta Pink, MD     MCD/MEDQ  D:  09/29/2008  T:  09/29/2008  Job:  [id number]

## 2010-12-12 ENCOUNTER — Ambulatory Visit (HOSPITAL_COMMUNITY)
Admission: RE | Admit: 2010-12-12 | Discharge: 2010-12-12 | Disposition: A | Discharge status: Home / Self Care | Payer: Self-pay | Source: Ambulatory Visit | Attending: Internal Medicine | Admitting: Internal Medicine

## 2010-12-12 ENCOUNTER — Other Ambulatory Visit (HOSPITAL_COMMUNITY): Payer: Self-pay | Admitting: Internal Medicine

## 2010-12-12 DIAGNOSIS — I10 Essential (primary) hypertension: Secondary | ICD-10-CM | POA: Insufficient documentation

## 2010-12-12 DIAGNOSIS — R42 Dizziness and giddiness: Secondary | ICD-10-CM | POA: Insufficient documentation

## 2010-12-12 DIAGNOSIS — I517 Cardiomegaly: Secondary | ICD-10-CM | POA: Insufficient documentation

## 2010-12-12 DIAGNOSIS — R079 Chest pain, unspecified: Secondary | ICD-10-CM | POA: Insufficient documentation

## 2011-04-03 ENCOUNTER — Ambulatory Visit (HOSPITAL_BASED_OUTPATIENT_CLINIC_OR_DEPARTMENT_OTHER): Payer: Self-pay | Attending: Family Medicine | Admitting: Radiology

## 2011-04-03 VITALS — Ht 64.0 in | Wt 234.0 lb

## 2011-04-03 DIAGNOSIS — I4949 Other premature depolarization: Secondary | ICD-10-CM | POA: Insufficient documentation

## 2011-04-03 DIAGNOSIS — G4710 Hypersomnia, unspecified: Secondary | ICD-10-CM | POA: Insufficient documentation

## 2011-04-03 DIAGNOSIS — G4730 Sleep apnea, unspecified: Secondary | ICD-10-CM

## 2011-04-07 DIAGNOSIS — I4949 Other premature depolarization: Secondary | ICD-10-CM | POA: Diagnosis not present

## 2011-04-07 DIAGNOSIS — G4730 Sleep apnea, unspecified: Secondary | ICD-10-CM | POA: Diagnosis not present

## 2011-04-07 DIAGNOSIS — G4710 Hypersomnia, unspecified: Secondary | ICD-10-CM | POA: Diagnosis not present

## 2011-04-07 NOTE — Procedures (Signed)
Veronica [name], Veronica [name] NO.:  [id number]  MEDICAL RECORD NO.:  [id number]          PATIENT TYPE:  OUT  LOCATION:  SLEEP CENTER                 FACILITY[facility]  [facility]  PHYSICIAN:  Leslyn Monda D. Maple Hudson, MD, FCCP, FACPDATE OF BIRTH:  02-[birth year]-63  DATE OF STUDY:  04/03/2011                           NOCTURNAL POLYSOMNOGRAM  REFERRING PHYSICIAN:  Norberto Sorenson, MD  REFERRING PHYSICIAN:  Norberto Sorenson, MD  INDICATION FOR STUDY:  Hypersomnia with sleep apnea.  EPWORTH SLEEPINESS SCORE:  6/24.  BMI 40.2, weight 234 pounds, height 64 inches, neck 15.5 inches.  MEDICATIONS:  Home medications are charted and reviewed.  SLEEP ARCHITECTURE:  Total sleep time 286 minutes with sleep efficiency 79.2%.  Stage I 11%, stage II 64.7%, stage III absent, REM 24.3% of total sleep time.  Sleep latency 25.5 minutes, REM latency 75 minutes, awake after sleep onset 48.5 minutes, arousal index 11.1.  Bedtime medication:  None.  RESPIRATORY DATA:  Apnea/hypopnea index (AHI) 2.5 per hour.  A total of 12 events were scored, all as hypoxemia.  Events were non-positional. REM AHI 7.8 per hour.  There were insufficient numbers of events to qualify for split protocol CPAP titration on this study night.  OXYGEN DATA:  Mild intermittent snoring with oxygen desaturation to a nadir of 90% and a mean oxygen saturation through the study of 95.9% on room air.  CARDIAC DATA:  Sinus rhythm with occasional PVC.  MOVEMENT-PARASOMNIA:  A few limb jerks were noted with insignificant sleep disturbance.  Bathroom x1.  IMPRESSIONS-RECOMMENDATIONS: 1. Sleep architecture was within normal adult limits in an unfamiliar     environment, with some sleep fragmentation and brief waking noted. 2. Occasional respiratory events with sleep disturbance, within normal     limits.  AHI 2.5 per hour (the normal     range for adults is between 0 and 5 events per hour).  Mild     intermittent snoring with oxygen desaturation to a nadir  of 90% and     a mean oxygen saturation through the study of 95.9% on room air.     Savon Bordonaro D. Maple Hudson, MD, [facility], FACP Diplomate, Biomedical engineer of Sleep Medicine Electronically Signed    CDY/MEDQ  D:  04/07/2011 10:36:51  T:  04/07/2011 11:47:51  Job:  [id number]

## 2011-07-10 ENCOUNTER — Other Ambulatory Visit (HOSPITAL_COMMUNITY): Payer: Self-pay | Admitting: Family Medicine

## 2011-07-10 DIAGNOSIS — R519 Headache, unspecified: Secondary | ICD-10-CM

## 2011-07-12 ENCOUNTER — Ambulatory Visit (HOSPITAL_COMMUNITY)
Admission: RE | Admit: 2011-07-12 | Discharge: 2011-07-12 | Disposition: A | Discharge status: Home / Self Care | Payer: Self-pay | Source: Ambulatory Visit | Attending: Family Medicine | Admitting: Family Medicine

## 2011-07-12 DIAGNOSIS — R51 Headache: Secondary | ICD-10-CM | POA: Insufficient documentation

## 2011-07-12 DIAGNOSIS — R519 Headache, unspecified: Secondary | ICD-10-CM

## 2012-04-10 ENCOUNTER — Encounter: Payer: Self-pay | Admitting: Family Medicine

## 2012-04-10 ENCOUNTER — Ambulatory Visit (INDEPENDENT_AMBULATORY_CARE_PROVIDER_SITE_OTHER): Payer: No Typology Code available for payment source | Admitting: Family Medicine

## 2012-04-10 VITALS — BP 136/79 | HR 69 | Temp 98.8°F | Ht 64.0 in | Wt 219.8 lb

## 2012-04-10 DIAGNOSIS — I10 Essential (primary) hypertension: Secondary | ICD-10-CM | POA: Insufficient documentation

## 2012-04-10 DIAGNOSIS — M329 Systemic lupus erythematosus, unspecified: Secondary | ICD-10-CM | POA: Diagnosis not present

## 2012-04-10 MED ORDER — AMLODIPINE BESYLATE 10 MG PO TABS: 10.0000 mg | ORAL_TABLET | Freq: Every day | ORAL | Status: DC

## 2012-04-10 MED ORDER — LISINOPRIL-HYDROCHLOROTHIAZIDE 20-12.5 MG PO TABS: 1.0000 | ORAL_TABLET | Freq: Every day | ORAL | Status: DC

## 2012-04-10 MED ORDER — VERAPAMIL HCL ER 180 MG PO CP24: 180.0000 mg | ORAL_CAPSULE | Freq: Every day | ORAL | Status: DC

## 2012-04-10 MED ORDER — POTASSIUM CHLORIDE ER 10 MEQ PO TBCR: 20.0000 meq | EXTENDED_RELEASE_TABLET | Freq: Two times a day (BID) | ORAL | Status: DC

## 2012-04-10 MED ORDER — TOPIRAMATE 50 MG PO TABS: 50.0000 mg | ORAL_TABLET | Freq: Two times a day (BID) | ORAL | Status: DC

## 2012-04-10 NOTE — Progress Notes (Addendum)
Subjective:    Patient ID: Veronica [name], female    DOB: 07/08/1960, 51 y.o.   MRN: [id number]  HPI  Patient presents to clinic to establish care.  Former Information systems manager patient.  Awaiting orange card.  Hypertension: patient was diagnosed with HTN several years ago and has been taking combination therapy.  She took all medications today and BP at goal.  She is requesting refills on some medications that are about to run out.  Patient denies any HA, numbness/tingling of extremities, weakness, or LE edema.  Denies any CP.  She admits to not exercising very much, but is motivated to start walking more.  She does not adhere to a low sodium diet.   I have reviewed patient's PMH, SH, FH, surgical Hx, Social Hx, Medications, Allergies, and Problem List today.  Review of Systems  Per HPI    Objective:   Physical Exam  Constitutional: She appears well-nourished. No distress.       obese  Pulmonary/Chest: Effort normal.  Musculoskeletal: She exhibits no edema.      Assessment & Plan:

## 2012-04-10 NOTE — Assessment & Plan Note (Addendum)
Will refill Lisinopril-HCTZ today.  Continue current regimen.  BP at goal.  Return to clinic after patient receives orange card to discuss other chronic issues.  Patient agrees with plan.

## 2012-05-02 ENCOUNTER — Telehealth: Payer: Self-pay | Admitting: *Deleted

## 2012-05-02 NOTE — Telephone Encounter (Signed)
Received a fax from pharmacy regarding refill on Verapamil.  Patient told the pharmacist that the sig should be one tablet BID.  Please clarify.

## 2012-05-06 NOTE — Telephone Encounter (Signed)
Verapamil is a 24 hour capsule so it should be taken once per day.  If patient has a cardiologist, then she should ask them for their opinion.  Otherwise, she can schedule an appointment with me at her earliest convenience.  Thanks.

## 2012-05-06 NOTE — Telephone Encounter (Signed)
Called pharmacist and clarified that medication is be be taken QD.  They will notify patient.

## 2012-05-06 NOTE — Telephone Encounter (Signed)
Called pt to ensure that she got the message from pharmacy about the way that she should be taking her meds. ONCE DAILY.Marland KitchenLoralee Pacas Grabill

## 2012-05-20 ENCOUNTER — Other Ambulatory Visit: Payer: Self-pay | Admitting: *Deleted

## 2012-05-20 MED ORDER — TOPIRAMATE 50 MG PO TABS: 50.0000 mg | ORAL_TABLET | Freq: Two times a day (BID) | ORAL | Status: DC

## 2012-06-02 ENCOUNTER — Encounter (HOSPITAL_COMMUNITY): Payer: Self-pay | Admitting: *Deleted

## 2012-06-02 DIAGNOSIS — Z872 Personal history of diseases of the skin and subcutaneous tissue: Secondary | ICD-10-CM | POA: Insufficient documentation

## 2012-06-02 DIAGNOSIS — K8020 Calculus of gallbladder without cholecystitis without obstruction: Secondary | ICD-10-CM | POA: Insufficient documentation

## 2012-06-02 DIAGNOSIS — Z90710 Acquired absence of both cervix and uterus: Secondary | ICD-10-CM | POA: Insufficient documentation

## 2012-06-02 DIAGNOSIS — R6883 Chills (without fever): Secondary | ICD-10-CM | POA: Insufficient documentation

## 2012-06-02 DIAGNOSIS — I10 Essential (primary) hypertension: Secondary | ICD-10-CM | POA: Insufficient documentation

## 2012-06-02 DIAGNOSIS — Z79899 Other long term (current) drug therapy: Secondary | ICD-10-CM | POA: Insufficient documentation

## 2012-06-02 DIAGNOSIS — R112 Nausea with vomiting, unspecified: Secondary | ICD-10-CM | POA: Insufficient documentation

## 2012-06-02 DIAGNOSIS — R197 Diarrhea, unspecified: Secondary | ICD-10-CM | POA: Insufficient documentation

## 2012-06-02 NOTE — ED Notes (Addendum)
C/o abd pain, also chills & nv. Last emesis PTA. Last BM PTA (loose). Last ate 1630. Tried pepto bismol at ~ 2230 w/o relief, (denies: fever, bleeding, back pain, dizziness or other sx). No aggravating factors. No aleviating factors. Pinpoints pain to RUQ.

## 2012-06-03 ENCOUNTER — Encounter (HOSPITAL_COMMUNITY): Payer: Self-pay | Admitting: Emergency Medicine

## 2012-06-03 ENCOUNTER — Emergency Department (HOSPITAL_COMMUNITY)
Admission: EM | Admit: 2012-06-03 | Discharge: 2012-06-03 | Disposition: A | Discharge status: Home / Self Care | Payer: Medicaid Other | Attending: Emergency Medicine | Admitting: Emergency Medicine

## 2012-06-03 ENCOUNTER — Emergency Department (HOSPITAL_COMMUNITY): Payer: Medicaid Other

## 2012-06-03 DIAGNOSIS — R197 Diarrhea, unspecified: Secondary | ICD-10-CM

## 2012-06-03 DIAGNOSIS — K8050 Calculus of bile duct without cholangitis or cholecystitis without obstruction: Secondary | ICD-10-CM

## 2012-06-03 LAB — CBC WITH DIFFERENTIAL/PLATELET
Basophils Absolute: 0 10*3/uL (ref 0.0–0.1)
Basophils Relative: 0 % (ref 0–1)
Eosinophils Absolute: 0.1 10*3/uL (ref 0.0–0.7)
Eosinophils Relative: 1 % (ref 0–5)
HCT: 38.2 % (ref 36.0–46.0)
Hemoglobin: 12.5 g/dL (ref 12.0–15.0)
Lymphocytes Relative: 18 % (ref 12–46)
Lymphs Abs: 1.5 10*3/uL (ref 0.7–4.0)
MCH: 28 pg (ref 26.0–34.0)
MCHC: 32.7 g/dL (ref 30.0–36.0)
MCV: 85.7 fL (ref 78.0–100.0)
Monocytes Absolute: 0.4 10*3/uL (ref 0.1–1.0)
Monocytes Relative: 5 % (ref 3–12)
Neutro Abs: 6.6 10*3/uL (ref 1.7–7.7)
Neutrophils Relative %: 77 % (ref 43–77)
Platelets: 298 10*3/uL (ref 150–400)
RBC: 4.46 MIL/uL (ref 3.87–5.11)
RDW: 13.5 % (ref 11.5–15.5)
WBC: 8.6 10*3/uL (ref 4.0–10.5)

## 2012-06-03 LAB — URINE MICROSCOPIC-ADD ON

## 2012-06-03 LAB — URINALYSIS, ROUTINE W REFLEX MICROSCOPIC
Bilirubin Urine: NEGATIVE
Glucose, UA: NEGATIVE mg/dL
Hgb urine dipstick: NEGATIVE
Ketones, ur: NEGATIVE mg/dL
Leukocytes, UA: NEGATIVE
Nitrite: NEGATIVE
Protein, ur: 30 mg/dL — AB
Specific Gravity, Urine: 1.027 (ref 1.005–1.030)
Urobilinogen, UA: 0.2 mg/dL (ref 0.0–1.0)
pH: 5.5 (ref 5.0–8.0)

## 2012-06-03 LAB — COMPREHENSIVE METABOLIC PANEL WITH GFR
ALT: 11 U/L (ref 0–35)
AST: 14 U/L (ref 0–37)
Albumin: 3.7 g/dL (ref 3.5–5.2)
Alkaline Phosphatase: 44 U/L (ref 39–117)
BUN: 11 mg/dL (ref 6–23)
CO2: 24 meq/L (ref 19–32)
Calcium: 9.3 mg/dL (ref 8.4–10.5)
Chloride: 105 meq/L (ref 96–112)
Creatinine, Ser: 0.85 mg/dL (ref 0.50–1.10)
GFR calc Af Amer: >90 mL/min
GFR calc non Af Amer: 79 mL/min — ABNORMAL LOW
Glucose, Bld: 126 mg/dL — ABNORMAL HIGH (ref 70–99)
Potassium: 3.4 meq/L — ABNORMAL LOW (ref 3.5–5.1)
Sodium: 140 meq/L (ref 135–145)
Total Bilirubin: 0.2 mg/dL — ABNORMAL LOW (ref 0.3–1.2)
Total Protein: 8.3 g/dL (ref 6.0–8.3)

## 2012-06-03 LAB — LIPASE, BLOOD: Lipase: 33 U/L (ref 11–59)

## 2012-06-03 MED ORDER — FENTANYL CITRATE 0.05 MG/ML IJ SOLN
50.0000 ug | Freq: Once | INTRAMUSCULAR | Status: AC
  Administered 2012-06-03: 50 ug via INTRAMUSCULAR

## 2012-06-03 MED ORDER — ONDANSETRON 4 MG PO TBDP
8.0000 mg | ORAL_TABLET | Freq: Once | ORAL | Status: AC
  Administered 2012-06-03: 8 mg via ORAL
  Filled 2012-06-03: qty 2

## 2012-06-03 MED ORDER — FENTANYL CITRATE 0.05 MG/ML IJ SOLN
50.0000 ug | Freq: Once | INTRAMUSCULAR | Status: DC
  Filled 2012-06-03: qty 2

## 2012-06-03 MED ORDER — OXYCODONE-ACETAMINOPHEN 5-325 MG PO TABS: 1.0000 | ORAL_TABLET | Freq: Four times a day (QID) | ORAL | Status: DC | PRN

## 2012-06-03 MED ORDER — FENTANYL CITRATE 0.05 MG/ML IJ SOLN
50.0000 ug | Freq: Once | INTRAMUSCULAR | Status: AC
  Administered 2012-06-03: 50 ug via INTRAMUSCULAR
  Filled 2012-06-03: qty 2

## 2012-06-03 MED ORDER — CIPROFLOXACIN HCL 500 MG PO TABS: 500.0000 mg | ORAL_TABLET | Freq: Two times a day (BID) | ORAL | Status: DC

## 2012-06-03 NOTE — ED Notes (Signed)
Care transferred and report given to [facility], [place]

## 2012-06-03 NOTE — ED Provider Notes (Signed)
History     CSN: [id number]  Arrival date & time 06/02/12  2314   First MD Initiated Contact with Patient 06/03/12 0115      Chief Complaint  Patient presents with  . Abdominal Pain  . Chills  . Emesis    (Consider location/radiation/quality/duration/timing/severity/associated sxs/prior treatment) Patient is a 52 y.o. female presenting with abdominal pain and vomiting. The history is provided by the patient.  Abdominal Pain The primary symptoms of the illness include abdominal pain, nausea, vomiting and diarrhea. The primary symptoms of the illness do not include fever or shortness of breath. The current episode started more than 2 days ago. The onset of the illness was gradual. The problem has not changed since onset. The abdominal pain began more than 2 days ago. The pain came on gradually. The abdominal pain has been unchanged since its onset. The abdominal pain is located in the RUQ. The abdominal pain does not radiate. The abdominal pain is relieved by nothing. The abdominal pain is exacerbated by fatty foods and eating.  The vomiting began more than 2 days ago. Vomiting occurred once. The emesis contains stomach contents.  The diarrhea began 3 to 5 days ago. The diarrhea is watery. The diarrhea occurs 5 to 10 times per day. Risk factors for illness producing diarrhea include suspect food intake.  The patient states that she believes she is currently not pregnant. Symptoms associated with the illness do not include frequency. Significant associated medical issues do not include PUD.  Emesis  This is a new problem. The current episode started more than 2 days ago. Episode frequency: once per day. The problem has not changed since onset.The emesis has an appearance of stomach contents. There has been no fever. Associated symptoms include abdominal pain and diarrhea. Pertinent negatives include no fever.  After eating at at [employer] shrimp on friday  Past Medical History   Diagnosis Date  . Hypertension   . SLE (systemic lupus erythematosus) 1998    Past Surgical History  Procedure Date  . Abdominal hysterectomy 2010  . Cesarean section     1992, 1995    Family History  Problem Relation Age of Onset  . Diabetes Mother   . Hypertension Mother   . Hypertension Father   . Vision loss Sister     History  Substance Use Topics  . Smoking status: Never Smoker   . Smokeless tobacco: Never Used  . Alcohol Use: No    OB History    Grav Para Term Preterm Abortions TAB SAB Ect Mult Living                  Review of Systems  Constitutional: Negative for fever.  Respiratory: Negative for shortness of breath.   Gastrointestinal: Positive for nausea, vomiting, abdominal pain and diarrhea.  Genitourinary: Negative for frequency.  All other systems reviewed and are negative.    Allergies  Review of patient's allergies indicates no known allergies.  Home Medications   Current Outpatient Rx  Name  Route  Sig  Dispense  Refill  . AMLODIPINE BESYLATE 10 MG PO TABS   Oral   Take 1 tablet (10 mg total) by mouth daily.   30 tablet   2   . LISINOPRIL-HYDROCHLOROTHIAZIDE 20-12.5 MG PO TABS   Oral   Take 1 tablet by mouth daily.   30 tablet   3   . POTASSIUM CHLORIDE ER 10 MEQ PO TBCR   Oral   Take 2 tablets (  20 mEq total) by mouth 2 (two) times daily.   60 tablet   2   . TOPIRAMATE 50 MG PO TABS   Oral   Take 1 tablet (50 mg total) by mouth 2 (two) times daily.   30 tablet   1   . VERAPAMIL HCL ER 180 MG PO CP24   Oral   Take 1 capsule (180 mg total) by mouth at bedtime.   30 capsule   2     BP 114/71  Pulse 59  Temp 99.1 F (37.3 C) (Oral)  Resp 22  SpO2 96%  Physical Exam  Constitutional: She is oriented to person, place, and time. She appears well-developed and well-nourished. No distress.  HENT:  Head: Normocephalic and atraumatic.  Mouth/Throat: Oropharynx is clear and moist.  Eyes: Conjunctivae normal are  normal. Pupils are equal, round, and reactive to light.  Neck: Normal range of motion. Neck supple.  Cardiovascular: Normal rate, regular rhythm and intact distal pulses.   Pulmonary/Chest: Effort normal and breath sounds normal. She has no wheezes. She has no rales.  Abdominal: Soft. Bowel sounds are normal. She exhibits no distension. There is no tenderness. There is no rebound and no guarding.  Musculoskeletal: Normal range of motion.  Neurological: She is alert and oriented to person, place, and time.  Skin: Skin is warm and dry.  Psychiatric: She has a normal mood and affect.    ED Course  Procedures (including critical care time)  Labs Reviewed  COMPREHENSIVE METABOLIC PANEL - Abnormal; Notable for the following:    Potassium 3.4 (*)     Glucose, Bld 126 (*)     Total Bilirubin 0.2 (*)     GFR calc non Af Amer 79 (*)     All other components within normal limits  URINALYSIS, ROUTINE W REFLEX MICROSCOPIC - Abnormal; Notable for the following:    APPearance CLOUDY (*)     Protein, ur 30 (*)     All other components within normal limits  URINE MICROSCOPIC-ADD ON - Abnormal; Notable for the following:    Squamous Epithelial / LPF FEW (*)     Crystals CA OXALATE CRYSTALS (*)     All other components within normal limits  CBC WITH DIFFERENTIAL  LIPASE, BLOOD   US Abdomen Complete  06/03/2012  *RADIOLOGY REPORT*  Clinical Data:  Right upper quadrant pain.  COMPLETE ABDOMINAL ULTRASOUND  Comparison:  None.  Findings:  Gallbladder:  Multiple shadowing gallstones present with diffusely sludge filled gallbladder.  No gallbladder wall thickening. Murphy's sign is positive.  Findings are nonspecific but are consistent with acute cholecystitis in the appropriate clinical setting.  Common bile duct:  Extrahepatic bile duct diameter is just over upper limits of normal 88 mm.  The duct is only segmentally visualized and intraductal stones cannot be excluded.  Liver:  Somewhat limited  visualization of the liver due to rib shadowing.  Liver parenchymal pattern appears to be somewhat inhomogeneous which may represent focal areas of fatty infiltration.  No definite focal mass lesion.  IVC:  Appears normal.  Pancreas:  Visualized portions of the head and body of the pancreas are unremarkable.  Spleen:  Spleen length measures 8.1 cm.  Normal parenchymal echotexture.  Right Kidney:  Right kidney measures 10.4 cm in length.  No hydronephrosis.  14 mm hypoechoic focus in the lower pole likely representing a small cyst.  Left Kidney:  The left kidney measures 10.9 cm length.  No hydronephrosis.  Abdominal aorta:  Visualized portions of the abdominal aorta are normal in caliber.  Distal aorta is not well seen.  IMPRESSION: Stones and sludge in the gallbladder with positive Murphy's sign. Findings are nonspecific but are consistent with acute cholecystitis in the appropriate clinical setting.  Mild dilatation of extrahepatic bile ducts.  Bile duct stones are not excluded.   Original Report Authenticated By: Burman Nieves, M.D.      No diagnosis found.    MDM  Pain free post fentanyl  515 case d/w Dr. Donell Beers without lab abnormalities or gall bladder wall thickening there is no need for surgical intervention at this time especially as patient is pain free.  Cipro and meds to go and close follow up   All lab and US findings reviewed with patient and husband at length.  Patient continues to be pain free and is comfortable with outpatient management.  They are instructed to return immediately for worsening symptoms       Britton Perkinson K Taeko Schaffer-Rasch, MD 06/03/12 [phone]

## 2012-06-03 NOTE — ED Notes (Signed)
Pt states understanding of discharge instructions 

## 2012-06-19 ENCOUNTER — Other Ambulatory Visit: Payer: Self-pay | Admitting: *Deleted

## 2012-06-19 MED ORDER — TOPIRAMATE 50 MG PO TABS: 50.0000 mg | ORAL_TABLET | Freq: Two times a day (BID) | ORAL | Status: DC

## 2012-06-25 ENCOUNTER — Ambulatory Visit (INDEPENDENT_AMBULATORY_CARE_PROVIDER_SITE_OTHER): Payer: Self-pay | Admitting: General Surgery

## 2012-06-26 ENCOUNTER — Other Ambulatory Visit: Payer: Self-pay | Admitting: Family Medicine

## 2012-07-11 ENCOUNTER — Ambulatory Visit (INDEPENDENT_AMBULATORY_CARE_PROVIDER_SITE_OTHER): Payer: PRIVATE HEALTH INSURANCE | Admitting: General Surgery

## 2012-07-11 ENCOUNTER — Encounter (INDEPENDENT_AMBULATORY_CARE_PROVIDER_SITE_OTHER): Payer: Self-pay | Admitting: General Surgery

## 2012-07-11 VITALS — BP 112/70 | HR 72 | Resp 16 | Ht 64.0 in | Wt 204.0 lb

## 2012-07-11 DIAGNOSIS — K8020 Calculus of gallbladder without cholecystitis without obstruction: Secondary | ICD-10-CM

## 2012-07-11 NOTE — Patient Instructions (Signed)

## 2012-07-14 ENCOUNTER — Encounter (HOSPITAL_COMMUNITY): Payer: Self-pay | Admitting: Pharmacy Technician

## 2012-07-14 ENCOUNTER — Other Ambulatory Visit: Payer: Self-pay | Admitting: Family Medicine

## 2012-07-14 NOTE — Progress Notes (Signed)
Patient ID: Veronica [name], female   DOB: 1961-02-02, 52 y.o.   MRN: [id number]  Chief Complaint  Patient presents with  . Cholelithiasis    HPI Veronica [name] is a 52 y.o. female.   HPI 52 yo female referred by Dr Nicanor Alcon For evaluation of gallstone disease. The patient was in the emergency department on February 4 with a 1-2 day history of intermittent right upper quadrant pain. The patient states it was also associated with nausea and vomiting. She had an ultrasound which demonstrated cholelithiasis. Her labs were normal. And she was discharged on. She states that she has had some additional episode since that time. She Has noticed a correlation when eating greasy and fried foods as well as foods that contained mayonnaise. She denies any diarrhea or constipation. She denies any heavy NSAID use. She denies any acholic stools. She denies any fevers or chills. She denies any jaundice. She states that she has lost about 30 pounds over the past 6 months which has not been intentional. Past Medical History  Diagnosis Date  . Hypertension   . SLE (systemic lupus erythematosus) 1998    Past Surgical History  Procedure Laterality Date  . Abdominal hysterectomy  09/2008  . Cesarean section  1992, 1995    Family History  Problem Relation Age of Onset  . Diabetes Mother   . Multiple sclerosis Sister   . Hypertension Father   . Hypertension Brother     Social History History  Substance Use Topics  . Smoking status: Never Smoker   . Smokeless tobacco: Never Used  . Alcohol Use: No    No Known Allergies  Current Outpatient Prescriptions  Medication Sig Dispense Refill  . amLODipine (NORVASC) 10 MG tablet Take 1 tablet (10 mg total) by mouth daily.  30 tablet  2  . potassium chloride (K-DUR) 10 MEQ tablet Take 2 tablets (20 mEq total) by mouth 2 (two) times daily.  60 tablet  2  . topiramate (TOPAMAX) 50 MG tablet Take 1 tablet (50 mg total) by mouth 2 (two) times daily.  30 tablet   1  . verapamil (VERELAN PM) 180 MG 24 hr capsule Take 1 capsule (180 mg total) by mouth at bedtime.  30 capsule  2  . lisinopril-hydrochlorothiazide (PRINZIDE,ZESTORETIC) 20-12.5 MG per tablet Take 1 tablet by mouth daily.  31 tablet  0   No current facility-administered medications for this visit.    Review of Systems Review of Systems  Constitutional: Positive for fatigue and unexpected weight change (30 pounds after 6 mo). Negative for fever and chills.  HENT: Negative for hearing loss, congestion, sore throat, trouble swallowing and voice change.   Eyes: Negative for visual disturbance.  Respiratory: Negative for cough and wheezing.   Cardiovascular: Negative for chest pain, palpitations and leg swelling.  Gastrointestinal: Positive for nausea, vomiting and abdominal pain. Negative for diarrhea, constipation, blood in stool, abdominal distention and anal bleeding.  Genitourinary: Negative for hematuria, vaginal bleeding and difficulty urinating.  Musculoskeletal: Negative for arthralgias.       +jt pain  Skin: Negative for rash and wound.  Neurological: Positive for numbness (in hands and feet) and headaches. Negative for seizures and syncope.  Hematological: Negative for adenopathy. Does not bruise/bleed easily.  Psychiatric/Behavioral: Negative for confusion.    Blood pressure 112/70, pulse 72, resp. rate 16, height 5\' 4"  (1.626 m), weight 204 lb (92.534 kg).  Physical Exam Physical Exam  Vitals reviewed. Constitutional: She is oriented to person, place,  and time. She appears well-developed and well-nourished. No distress.  Obese   HENT:  Head: Normocephalic and atraumatic.  Right Ear: External ear normal.  Left Ear: External ear normal.  Eyes: Conjunctivae are normal. No scleral icterus.  Neck: Neck supple. No tracheal deviation present. No thyromegaly present.  Cardiovascular: Normal rate, regular rhythm and normal heart sounds.   Pulmonary/Chest: Effort normal and  breath sounds normal. No respiratory distress. She has no wheezes.  Abdominal: Soft. She exhibits no distension. There is no tenderness. There is no rebound and no guarding.  Musculoskeletal: She exhibits no edema.  Lymphadenopathy:    She has no cervical adenopathy.  Neurological: She is alert and oriented to person, place, and time.  Skin: Skin is warm and dry. No rash noted. She is not diaphoretic. No erythema.  Psychiatric: She has a normal mood and affect. Her behavior is normal. Judgment and thought content normal.    Data Reviewed ED note from 2/4 nml CMET except for K 3.4 nml CBC  COMPLETE ABDOMINAL ULTRASOUND 06/03/12 Comparison: None.  Findings:  Gallbladder: Multiple shadowing gallstones present with diffusely  sludge filled gallbladder. No gallbladder wall thickening.  Murphy's sign is positive. Findings are nonspecific but are  consistent with acute cholecystitis in the appropriate clinical  setting.  Common bile duct: Extrahepatic bile duct diameter is just over  upper limits of normal 88 mm. The duct is only segmentally  visualized and intraductal stones cannot be excluded.  Liver: Somewhat limited visualization of the liver due to rib  shadowing. Liver parenchymal pattern appears to be somewhat  inhomogeneous which may represent focal areas of fatty  infiltration. No definite focal mass lesion.  IVC: Appears normal.  Pancreas: Visualized portions of the head and body of the pancreas  are unremarkable.  Spleen: Spleen length measures 8.1 cm. Normal parenchymal  echotexture.  Right Kidney: Right kidney measures 10.4 cm in length. No  hydronephrosis. 14 mm hypoechoic focus in the lower pole likely  representing a small cyst.  Left Kidney: The left kidney measures 10.9 cm length. No  hydronephrosis.  Abdominal aorta: Visualized portions of the abdominal aorta are  normal in caliber. Distal aorta is not well seen.  IMPRESSION:  Stones and sludge in the gallbladder  with positive Murphy's sign.  Findings are nonspecific but are consistent with acute  cholecystitis in the appropriate clinical setting. Mild dilatation  of extrahepatic bile ducts. Bile duct stones are not excluded.   Assessment    Symptomatic cholelithiasis     Plan    I believe the patient's symptoms are consistent with gallbladder disease.  We discussed gallbladder disease. The patient was given Agricultural engineer. We discussed non-operative and operative management. We discussed the signs & symptoms of acute cholecystitis  I discussed laparoscopic cholecystectomy with IOC in detail.  The patient was given educational material as well as diagrams detailing the procedure.  We discussed the risks and benefits of a laparoscopic cholecystectomy including, but not limited to bleeding, infection, injury to surrounding structures such as the intestine or liver, bile leak, retained gallstones, need to convert to an open procedure, prolonged diarrhea, blood clots such as  DVT, common bile duct injury, anesthesia risks, and possible need for additional procedures.  We discussed the typical post-operative recovery course. I explained that the likelihood of improvement of their symptoms is good.  The patient will be scheduled for laparoscopic cholecystectomy in the near future  Petro Talent M. Andrey Campanile, MD, FACS General, Bariatric, & Minimally [facility] [place]          [facility] M 07/14/2012, 1:28 PM

## 2012-07-16 ENCOUNTER — Encounter (HOSPITAL_COMMUNITY)
Admission: RE | Admit: 2012-07-16 | Discharge: 2012-07-16 | Disposition: A | Discharge status: Home / Self Care | Payer: No Typology Code available for payment source | Source: Ambulatory Visit | Attending: Anesthesiology | Admitting: Anesthesiology

## 2012-07-16 ENCOUNTER — Encounter (HOSPITAL_COMMUNITY): Payer: Self-pay

## 2012-07-16 ENCOUNTER — Encounter (HOSPITAL_COMMUNITY)
Admission: RE | Admit: 2012-07-16 | Discharge: 2012-07-16 | Disposition: A | Discharge status: Home / Self Care | Payer: No Typology Code available for payment source | Source: Ambulatory Visit | Attending: General Surgery | Admitting: General Surgery

## 2012-07-16 LAB — COMPREHENSIVE METABOLIC PANEL
ALT: 10 U/L (ref 0–35)
AST: 19 U/L (ref 0–37)
Albumin: 3.8 g/dL (ref 3.5–5.2)
Alkaline Phosphatase: 45 U/L (ref 39–117)
BUN: 12 mg/dL (ref 6–23)
CO2: 25 mEq/L (ref 19–32)
Calcium: 9.7 mg/dL (ref 8.4–10.5)
Chloride: 101 mEq/L (ref 96–112)
Creatinine, Ser: 0.89 mg/dL (ref 0.50–1.10)
GFR calc Af Amer: 87 mL/min — ABNORMAL LOW (ref >90)
GFR calc non Af Amer: 75 mL/min — ABNORMAL LOW (ref >90)
Glucose, Bld: 98 mg/dL (ref 70–99)
Potassium: 3.4 mEq/L — ABNORMAL LOW (ref 3.5–5.1)
Sodium: 136 mEq/L (ref 135–145)
Total Bilirubin: 0.2 mg/dL — ABNORMAL LOW (ref 0.3–1.2)
Total Protein: 8.2 g/dL (ref 6.0–8.3)

## 2012-07-16 LAB — CBC
HCT: 37.2 % (ref 36.0–46.0)
Hemoglobin: 12.3 g/dL (ref 12.0–15.0)
MCH: 28.3 pg (ref 26.0–34.0)
MCHC: 33.1 g/dL (ref 30.0–36.0)
MCV: 85.5 fL (ref 78.0–100.0)
Platelets: 301 10*3/uL (ref 150–400)
RBC: 4.35 MIL/uL (ref 3.87–5.11)
RDW: 13.4 % (ref 11.5–15.5)
WBC: 6.9 10*3/uL (ref 4.0–10.5)

## 2012-07-16 LAB — SURGICAL PCR SCREEN
MRSA, PCR: NEGATIVE
Staphylococcus aureus: NEGATIVE

## 2012-07-16 NOTE — Progress Notes (Signed)
Primary Physician - Dr. Sondra Come Does not have a cardiologist No previous cardiac testing

## 2012-07-16 NOTE — Pre-Procedure Instructions (Signed)
Veronica [name]  07/16/2012   Your procedure is scheduled on:  Friday, March 21st  Report to [facility] at 0800 AM.  Call this number if you have problems the morning of surgery: [phone]   Remember:   Do not eat food or drink liquids after midnight.    Take these medicines the morning of surgery with A SIP OF WATER: Norvasc, Topamax   Do not wear jewelry, make-up or nail polish.   Do not wear lotions, powders, or perfumes,deodorant.  Do not shave 48 hours prior to surgery.   Do not bring valuables to the hospital.  Contacts, dentures or bridgework may not be worn into surgery.  Leave suitcase in the car. After surgery it may be brought to your room.  For patients admitted to the hospital, checkout time is 11:00 AM the day of discharge.   Patients discharged the day of surgery will not be allowed to drive home.    Special Instructions: Shower using CHG 2 nights before surgery and the night before surgery.  If you shower the day of surgery use CHG.  Use special wash - you have one bottle of CHG for all showers.  You should use approximately 1/3 of the bottle for each shower.   Please read over the following fact sheets that you were given: Pain Booklet, Coughing and Deep Breathing, MRSA Information and Surgical Site Infection Prevention

## 2012-07-17 MED ORDER — DEXTROSE 5 % IV SOLN
2.0000 g | INTRAVENOUS | Status: AC
  Administered 2012-07-18: 2 g via INTRAVENOUS
  Filled 2012-07-17: qty 2

## 2012-07-17 NOTE — Progress Notes (Signed)
Review: Patient is a 52 year old female scheduled for laparoscopic cholecystectomy by Dr. Andrey Campanile on 07/18/2012. History noted and includes SLE.  EKG on 07/16/2012 showed normal sinus rhythm, minimal voltage criteria for LVH, nonspecific T wave abnormality. The interpreting cardiologist did not feel it would significantly changed since the prior tracing on 05/12/2008.  Preoperative labs and CXR noted.    She will be evaluated by her assigned anesthesiologist on the day of surgery, but if no acute changes then would anticipate she could proceed as planned.  Velna Ochs [facility]/Anesthesiology Phone [phone] 07/17/2012 10:02 AM

## 2012-07-18 ENCOUNTER — Ambulatory Visit (HOSPITAL_COMMUNITY): Payer: No Typology Code available for payment source | Admitting: Anesthesiology

## 2012-07-18 ENCOUNTER — Encounter (HOSPITAL_COMMUNITY): Payer: Self-pay | Admitting: Vascular Surgery

## 2012-07-18 ENCOUNTER — Ambulatory Visit (HOSPITAL_COMMUNITY): Payer: No Typology Code available for payment source

## 2012-07-18 ENCOUNTER — Encounter (HOSPITAL_COMMUNITY): Payer: Self-pay | Admitting: Anesthesiology

## 2012-07-18 ENCOUNTER — Ambulatory Visit (HOSPITAL_COMMUNITY)
Admission: RE | Admit: 2012-07-18 | Discharge: 2012-07-18 | Disposition: A | Discharge status: Home / Self Care | Payer: No Typology Code available for payment source | Source: Ambulatory Visit | Attending: General Surgery | Admitting: General Surgery

## 2012-07-18 ENCOUNTER — Encounter (HOSPITAL_COMMUNITY)
Admission: RE | Disposition: A | Discharge status: Home / Self Care | Payer: Self-pay | Source: Ambulatory Visit | Attending: General Surgery

## 2012-07-18 DIAGNOSIS — K8010 Calculus of gallbladder with chronic cholecystitis without obstruction: Secondary | ICD-10-CM

## 2012-07-18 DIAGNOSIS — R51 Headache: Secondary | ICD-10-CM | POA: Insufficient documentation

## 2012-07-18 DIAGNOSIS — M329 Systemic lupus erythematosus, unspecified: Secondary | ICD-10-CM | POA: Insufficient documentation

## 2012-07-18 DIAGNOSIS — I10 Essential (primary) hypertension: Secondary | ICD-10-CM | POA: Insufficient documentation

## 2012-07-18 DIAGNOSIS — Z79899 Other long term (current) drug therapy: Secondary | ICD-10-CM | POA: Insufficient documentation

## 2012-07-18 DIAGNOSIS — K660 Peritoneal adhesions (postprocedural) (postinfection): Secondary | ICD-10-CM | POA: Insufficient documentation

## 2012-07-18 HISTORY — PX: CHOLECYSTECTOMY: SHX55

## 2012-07-18 SURGERY — LAPAROSCOPIC CHOLECYSTECTOMY WITH INTRAOPERATIVE CHOLANGIOGRAM
Anesthesia: General | Site: Abdomen | Wound class: Contaminated

## 2012-07-18 MED ORDER — LIDOCAINE HCL (CARDIAC) 20 MG/ML IV SOLN
INTRAVENOUS | Status: DC | PRN
  Administered 2012-07-18: 70 mg via INTRAVENOUS

## 2012-07-18 MED ORDER — NEOSTIGMINE METHYLSULFATE 1 MG/ML IJ SOLN
INTRAMUSCULAR | Status: DC | PRN
  Administered 2012-07-18: 5 mg via INTRAVENOUS

## 2012-07-18 MED ORDER — ONDANSETRON HCL 4 MG/2ML IJ SOLN
INTRAMUSCULAR | Status: DC | PRN
  Administered 2012-07-18: 4 mg via INTRAVENOUS

## 2012-07-18 MED ORDER — ROCURONIUM BROMIDE 100 MG/10ML IV SOLN
INTRAVENOUS | Status: DC | PRN
  Administered 2012-07-18: 35 mg via INTRAVENOUS

## 2012-07-18 MED ORDER — OXYCODONE HCL 5 MG PO TABS: 5.0000 mg | ORAL_TABLET | ORAL | Status: DC | PRN

## 2012-07-18 MED ORDER — BUPIVACAINE-EPINEPHRINE 0.25% -1:200000 IJ SOLN
INTRAMUSCULAR | Status: DC | PRN
  Administered 2012-07-18: 20 mL

## 2012-07-18 MED ORDER — FENTANYL CITRATE 0.05 MG/ML IJ SOLN
INTRAMUSCULAR | Status: DC | PRN
  Administered 2012-07-18: 50 ug via INTRAVENOUS
  Administered 2012-07-18: 100 ug via INTRAVENOUS
  Administered 2012-07-18: 50 ug via INTRAVENOUS

## 2012-07-18 MED ORDER — LACTATED RINGERS IV SOLN
INTRAVENOUS | Status: DC | PRN
  Administered 2012-07-18 (×2): via INTRAVENOUS

## 2012-07-18 MED ORDER — 0.9 % SODIUM CHLORIDE (POUR BTL) OPTIME
TOPICAL | Status: DC | PRN
  Administered 2012-07-18 (×2): 1000 mL

## 2012-07-18 MED ORDER — ONDANSETRON HCL 4 MG/2ML IJ SOLN: 4.0000 mg | Freq: Four times a day (QID) | INTRAMUSCULAR | Status: DC | PRN

## 2012-07-18 MED ORDER — GLYCOPYRROLATE 0.2 MG/ML IJ SOLN
INTRAMUSCULAR | Status: DC | PRN
  Administered 2012-07-18: 0.6 mg via INTRAVENOUS

## 2012-07-18 MED ORDER — SODIUM CHLORIDE 0.9 % IV SOLN
INTRAVENOUS | Status: DC | PRN
  Administered 2012-07-18: 10:00:00

## 2012-07-18 MED ORDER — OXYCODONE-ACETAMINOPHEN 5-325 MG PO TABS: 1.0000 | ORAL_TABLET | ORAL | Status: DC | PRN

## 2012-07-18 MED ORDER — PROPOFOL 10 MG/ML IV BOLUS
INTRAVENOUS | Status: DC | PRN
  Administered 2012-07-18: 30 mg via INTRAVENOUS
  Administered 2012-07-18: 200 mg via INTRAVENOUS

## 2012-07-18 MED ORDER — LACTATED RINGERS IV SOLN
INTRAVENOUS | Status: DC
  Administered 2012-07-18: 09:00:00 via INTRAVENOUS

## 2012-07-18 MED ORDER — ACETAMINOPHEN 10 MG/ML IV SOLN
1000.0000 mg | Freq: Once | INTRAVENOUS | Status: AC
  Administered 2012-07-18: 1000 mg via INTRAVENOUS

## 2012-07-18 MED ORDER — DEXAMETHASONE SODIUM PHOSPHATE 4 MG/ML IJ SOLN
INTRAMUSCULAR | Status: DC | PRN
  Administered 2012-07-18: 4 mg via INTRAVENOUS

## 2012-07-18 MED ORDER — SODIUM CHLORIDE 0.9 % IR SOLN
Status: DC | PRN
  Administered 2012-07-18: 1000 mL

## 2012-07-18 MED ORDER — BUPIVACAINE-EPINEPHRINE PF 0.25-1:200000 % IJ SOLN
INTRAMUSCULAR | Status: AC
  Filled 2012-07-18: qty 30

## 2012-07-18 MED ORDER — ACETAMINOPHEN 10 MG/ML IV SOLN
INTRAVENOUS | Status: AC
  Filled 2012-07-18: qty 100

## 2012-07-18 SURGICAL SUPPLY — 46 items
APPLIER CLIP 5 13 M/L LIGAMAX5 (MISCELLANEOUS) ×4
BANDAGE ADHESIVE 1X3 (GAUZE/BANDAGES/DRESSINGS) ×6 IMPLANT
BENZOIN TINCTURE PRP APPL 2/3 (GAUZE/BANDAGES/DRESSINGS) ×2 IMPLANT
BLADE SURG 11 STRL SS (BLADE) ×2 IMPLANT
BLADE SURG ROTATE 9660 (MISCELLANEOUS) IMPLANT
CANISTER SUCTION 2500CC (MISCELLANEOUS) ×2 IMPLANT
CHLORAPREP W/TINT 26ML (MISCELLANEOUS) ×2 IMPLANT
CLIP APPLIE 5 13 M/L LIGAMAX5 (MISCELLANEOUS) ×2 IMPLANT
CLOTH BEACON ORANGE TIMEOUT ST (SAFETY) ×2 IMPLANT
COVER MAYO STAND STRL (DRAPES) ×2 IMPLANT
COVER SURGICAL LIGHT HANDLE (MISCELLANEOUS) ×2 IMPLANT
DECANTER SPIKE VIAL GLASS SM (MISCELLANEOUS) ×2 IMPLANT
DRAPE C-ARM 42X72 X-RAY (DRAPES) ×2 IMPLANT
DRAPE UTILITY 15X26 W/TAPE STR (DRAPE) ×4 IMPLANT
DRSG TEGADERM 4X4.75 (GAUZE/BANDAGES/DRESSINGS) ×2 IMPLANT
ELECT REM PT RETURN 9FT ADLT (ELECTROSURGICAL) ×2
ELECTRODE REM PT RTRN 9FT ADLT (ELECTROSURGICAL) ×1 IMPLANT
GAUZE SPONGE 2X2 8PLY STRL LF (GAUZE/BANDAGES/DRESSINGS) ×1 IMPLANT
GLOVE BIO SURGEON STRL SZ7 (GLOVE) ×2 IMPLANT
GLOVE BIO SURGEON STRL SZ7.5 (GLOVE) ×2 IMPLANT
GLOVE BIOGEL M STRL SZ7.5 (GLOVE) ×4 IMPLANT
GLOVE BIOGEL PI IND STRL 7.5 (GLOVE) ×2 IMPLANT
GLOVE BIOGEL PI IND STRL 8 (GLOVE) ×1 IMPLANT
GLOVE BIOGEL PI INDICATOR 7.5 (GLOVE) ×2
GLOVE BIOGEL PI INDICATOR 8 (GLOVE) ×1
GLOVE ECLIPSE 7.5 STRL STRAW (GLOVE) ×2 IMPLANT
GLOVE SURG SS PI 7.0 STRL IVOR (GLOVE) ×2 IMPLANT
GOWN PREVENTION PLUS XLARGE (GOWN DISPOSABLE) ×2 IMPLANT
GOWN STRL NON-REIN LRG LVL3 (GOWN DISPOSABLE) ×6 IMPLANT
KIT BASIN OR (CUSTOM PROCEDURE TRAY) ×2 IMPLANT
KIT ROOM TURNOVER OR (KITS) ×2 IMPLANT
NS IRRIG 1000ML POUR BTL (IV SOLUTION) ×2 IMPLANT
PAD ARMBOARD 7.5X6 YLW CONV (MISCELLANEOUS) ×2 IMPLANT
POUCH SPECIMEN RETRIEVAL 10MM (ENDOMECHANICALS) ×2 IMPLANT
SCISSORS LAP 5X35 DISP (ENDOMECHANICALS) IMPLANT
SET CHOLANGIOGRAPH 5 50 .035 (SET/KITS/TRAYS/PACK) ×2 IMPLANT
SET IRRIG TUBING LAPAROSCOPIC (IRRIGATION / IRRIGATOR) ×2 IMPLANT
SLEEVE ENDOPATH XCEL 5M (ENDOMECHANICALS) ×4 IMPLANT
SPECIMEN JAR SMALL (MISCELLANEOUS) ×2 IMPLANT
SPONGE GAUZE 2X2 STER 10/PKG (GAUZE/BANDAGES/DRESSINGS) ×1
SUT MNCRL AB 4-0 PS2 18 (SUTURE) ×2 IMPLANT
TOWEL OR 17X24 6PK STRL BLUE (TOWEL DISPOSABLE) ×2 IMPLANT
TOWEL OR 17X26 10 PK STRL BLUE (TOWEL DISPOSABLE) ×2 IMPLANT
TRAY LAPAROSCOPIC (CUSTOM PROCEDURE TRAY) ×2 IMPLANT
TROCAR XCEL BLUNT TIP 100MML (ENDOMECHANICALS) ×2 IMPLANT
TROCAR XCEL NON-BLD 5MMX100MML (ENDOMECHANICALS) ×2 IMPLANT

## 2012-07-18 NOTE — Anesthesia Procedure Notes (Signed)
Procedure Name: Intubation Date/Time: 07/18/2012 10:06 AM Performed by: Lovie Chol Pre-anesthesia Checklist: Patient identified, Emergency Drugs available, Suction available and Patient being monitored Patient Re-evaluated:Patient Re-evaluated prior to inductionOxygen Delivery Method: Circle system utilized Preoxygenation: Pre-oxygenation with 100% oxygen Intubation Type: IV induction Ventilation: Mask ventilation without difficulty and Oral airway inserted - appropriate to patient size Laryngoscope Size: Miller Grade View: Grade I Tube type: Oral Tube size: 7.0 mm Number of attempts: 1 Airway Equipment and Method: Stylet Placement Confirmation: ETT inserted through vocal cords under direct vision,  positive ETCO2,  CO2 detector and breath sounds checked- equal and bilateral Secured at: 21 cm Tube secured with: Tape Dental Injury: Teeth and Oropharynx as per pre-operative assessment

## 2012-07-18 NOTE — Anesthesia Preprocedure Evaluation (Addendum)
Anesthesia Evaluation  Patient identified by MRN, date of birth, ID band Patient awake    Reviewed: Allergy & Precautions, H&P , NPO status , Patient's Chart, lab work & pertinent test results  History of Anesthesia Complications Negative for: history of anesthetic complications  Airway Mallampati: III TM Distance: >3 FB Neck ROM: Full    Dental  (+) Teeth Intact and Dental Advisory Given   Pulmonary neg pulmonary ROS,  breath sounds clear to auscultation        Cardiovascular hypertension, Pt. on medications Rhythm:Regular Rate:Normal     Neuro/Psych  Headaches,    GI/Hepatic negative GI ROS, Neg liver ROS,   Endo/Other  Morbid obesity  Renal/GU negative Renal ROS     Musculoskeletal   Abdominal   Peds  Hematology   Anesthesia Other Findings   Reproductive/Obstetrics negative OB ROS                           Anesthesia Physical Anesthesia Plan  ASA: II  Anesthesia Plan: General   Post-op Pain Management:    Induction: Intravenous  Airway Management Planned: Oral ETT  Additional Equipment:   Intra-op Plan:   Post-operative Plan: Extubation in OR  Informed Consent: I have reviewed the patients History and Physical, chart, labs and discussed the procedure including the risks, benefits and alternatives for the proposed anesthesia with the patient or authorized representative who has indicated his/her understanding and acceptance.     Plan Discussed with: CRNA, Anesthesiologist and Surgeon  Anesthesia Plan Comments:         Anesthesia Quick Evaluation

## 2012-07-18 NOTE — Op Note (Addendum)
Laparoscopic Cholecystectomy with IOC And laparoscopic lysis of adhesions for 15 minutes Procedure Note  Indications: This patient presents with symptomatic gallbladder disease and will undergo laparoscopic cholecystectomy.  Pre-operative Diagnosis: symptomatic cholelithiasis  Post-operative Diagnosis: Calculus of gallbladder with other cholecystitis, without mention of obstruction; Intra-abdominal adhesions  Surgeon: Atilano Ina   Assistants: none  Anesthesia: General endotracheal anesthesia  ASA Class: 2  Procedure Details  The patient was seen again in the Holding Room. The risks, benefits, complications, treatment options, and expected outcomes were discussed with the patient. The possibilities of reaction to medication, pulmonary aspiration, perforation of viscus, bleeding, recurrent infection, finding a normal gallbladder, the need for additional procedures, failure to diagnose a condition, the possible need to convert to an open procedure, and creating a complication requiring transfusion or operation were discussed with the patient. The likelihood of improving the patient's symptoms with return to their baseline status is good.  The patient and/or family concurred with the proposed plan, giving informed consent. The site of surgery properly noted. The patient was taken to Operating Room, identified as Veronica [name] and the procedure verified as Laparoscopic Cholecystectomy with Intraoperative Cholangiogram. A Time Out was held and the above information confirmed.  Prior to the induction of general anesthesia, antibiotic prophylaxis was administered. General endotracheal anesthesia was then administered and tolerated well. After the induction, the abdomen was prepped with Chloraprep and draped in the sterile fashion. The patient was positioned in the supine position.  Local anesthetic agent was injected into the skin near the umbilicus and an incision made thru an old vertical  infraumbilical incision. We dissected down to the abdominal fascia with blunt dissection.  The fascia was incised vertically and we entered the peritoneal cavity bluntly.  A pursestring suture of 0-Vicryl was placed around the fascial opening.  The Hasson cannula was inserted and secured with the stay suture.  Pneumoperitoneum was then created with CO2 and tolerated well without any adverse changes in the patient's vital signs. The laparoscope was advanced and it appeared that we were under some omentum. I was able to navigate the camera up into the right upper quadrant where there were no adhesions. An 5-mm port was placed in the subxiphoid position.  Two 5-mm ports were placed in the right upper quadrant. All skin incisions were infiltrated with a local anesthetic agent before making the incision and placing the trocars.   The laparoscope was placed in the subxiphoid trocar and looked down toward the midline. The patient had omental adhesions in her midline. There did not appear to be any bowel adherent to the abdominal wall We positioned the patient in reverse Trendelenburg, tilted slightly to the patient's left.  The gallbladder was identified, the fundus grasped and retracted cephalad. Adhesions were lysed bluntly and with the electrocautery where indicated, taking care not to injure any adjacent organs or viscus. The infundibulum was grasped and retracted laterally, exposing the peritoneum overlying the triangle of Calot. This was then divided and exposed in a blunt fashion. A critical view of the cystic duct and cystic artery was obtained.  The cystic duct was clearly identified and bluntly dissected circumferentially. The cystic duct was ligated with a clip distally.   An incision was made in the cystic duct and the [facility] cholangiogram catheter introduced. When the catheter was flushed, there was spillage of saline from the duct. The catheter was removed. The cystic duct was manipulated and I extracted  several gallstones from the cystic duct which were discarded from  the abdominal cavity. The catheter was secured using a clip However there were still some spillage of saline. Despite this I proceeded with performing a cholangiogram.. A cholangiogram was then obtained which showed good visualization of the distal and proximal biliary tree with no sign of filling defects or obstruction.  There was extravasation of contrast from the cholangiogram catheter site in the cystic duct; however, Contrast flowed easily into the duodenum. The catheter was then removed.   The cystic duct was then ligated with clips and divided. The cystic artery Both and anterior and posterior branch were identified, dissected free, ligated with clips and divided as well.   The gallbladder was dissected from the liver bed in retrograde fashion with the electrocautery. There was some spillage of bile from the gallbladderThe gallbladder was removed and placed in an Endocatch sac. The liver bed was irrigated and inspected. Hemostasis was achieved with the electrocautery. Copious irrigation was utilized and was repeatedly aspirated until clear.     At this point the camera was placed in the right upper quadrant trocar and using Endo Shears I took down the upper midline omental adhesions down toward the umbilicus. The omentum was freed around the [facility] trocar. There is no evidence of bowel in this area. I left the lower midline adhesions alone.   The gallbladder and Endocatch sac were then removed through the umbilical port site.The pursestring suture was used to close the umbilical fascia.  However there was an air leak at the umbilical fascia; and an additional single interrupted 0 Vicryl suture was used to close the umbilical fascia. There is no air leak at this point.  We again inspected the right upper quadrant for hemostasis.  The umbilical closure was inspected and there was no air leak and nothing trapped within the closure.  Pneumoperitoneum was released as we removed the trocars.  4-0 Monocryl was used to close the skin.   Benzoin, steri-strips, and clean dressings were applied. The patient was then extubated and brought to the recovery room in stable condition. Instrument, sponge, and needle counts were correct at closure and at the conclusion of the case.   Findings: Chronic Cholecystitis with Cholelithiasis Intra-abdominal omental adhesions to midline Although there were stones extracted from the cystic duct and extravasation of contrast from the cystic duct There was no evidence of filling defect in the common bile duct and contrast emptied into the bowel. Therefore I will follow the patient clinically  Estimated Blood Loss: Minimal         Drains: None         Specimens: Gallbladder           Complications: None; patient tolerated the procedure well.         Disposition: PACU - hemodynamically stable.         Condition: stable  Mary Sella. Andrey Campanile, MD, FACS General, Bariatric, & Minimally Invasive Surgery [facility], [place]

## 2012-07-18 NOTE — Anesthesia Postprocedure Evaluation (Signed)
  Anesthesia Post-op Note  Patient: Veronica [name]  Procedure(s) Performed: Procedure(s): LAPAROSCOPIC CHOLECYSTECTOMY WITH INTRAOPERATIVE CHOLANGIOGRAM (N/A)  Patient Location: PACU  Anesthesia Type:General  Level of Consciousness: awake, alert , oriented and patient cooperative  Airway and Oxygen Therapy: Patient Spontanous Breathing  Post-op Pain: mild  Post-op Assessment: Post-op Vital signs reviewed, Patient's Cardiovascular Status Stable, Respiratory Function Stable, Patent Airway, No signs of Nausea or vomiting and Pain level controlled  Post-op Vital Signs: stable  Complications: No apparent anesthesia complications

## 2012-07-18 NOTE — Preoperative (Signed)
Beta Blockers   Reason not to administer Beta Blockers:Not Applicable 

## 2012-07-18 NOTE — Transfer of Care (Signed)
Immediate Anesthesia Transfer of Care Note  Patient: Veronica [name]  Procedure(s) Performed: Procedure(s): LAPAROSCOPIC CHOLECYSTECTOMY WITH INTRAOPERATIVE CHOLANGIOGRAM (N/A)  Patient Location: PACU  Anesthesia Type:General  Level of Consciousness: oriented, sedated and patient cooperative  Airway & Oxygen Therapy: Patient Spontanous Breathing and Patient connected to face mask oxygen  Post-op Assessment: Report given to PACU RN and Post -op Vital signs reviewed and stable  Post vital signs: Reviewed and stable  Complications: No apparent anesthesia complications

## 2012-07-18 NOTE — H&P (View-Only) (Signed)
Patient ID: Veronica [name], female   DOB: 1961-02-02, 52 y.o.   MRN: [id number]  Chief Complaint  Patient presents with  . Cholelithiasis    HPI Veronica [name] is a 52 y.o. female.   HPI 52 yo female referred by Dr Nicanor Alcon For evaluation of gallstone disease. The patient was in the emergency department on February 4 with a 1-2 day history of intermittent right upper quadrant pain. The patient states it was also associated with nausea and vomiting. She had an ultrasound which demonstrated cholelithiasis. Her labs were normal. And she was discharged on. She states that she has had some additional episode since that time. She Has noticed a correlation when eating greasy and fried foods as well as foods that contained mayonnaise. She denies any diarrhea or constipation. She denies any heavy NSAID use. She denies any acholic stools. She denies any fevers or chills. She denies any jaundice. She states that she has lost about 30 pounds over the past 6 months which has not been intentional. Past Medical History  Diagnosis Date  . Hypertension   . SLE (systemic lupus erythematosus) 1998    Past Surgical History  Procedure Laterality Date  . Abdominal hysterectomy  09/2008  . Cesarean section  1992, 1995    Family History  Problem Relation Age of Onset  . Diabetes Mother   . Multiple sclerosis Sister   . Hypertension Father   . Hypertension Brother     Social History History  Substance Use Topics  . Smoking status: Never Smoker   . Smokeless tobacco: Never Used  . Alcohol Use: No    No Known Allergies  Current Outpatient Prescriptions  Medication Sig Dispense Refill  . amLODipine (NORVASC) 10 MG tablet Take 1 tablet (10 mg total) by mouth daily.  30 tablet  2  . potassium chloride (K-DUR) 10 MEQ tablet Take 2 tablets (20 mEq total) by mouth 2 (two) times daily.  60 tablet  2  . topiramate (TOPAMAX) 50 MG tablet Take 1 tablet (50 mg total) by mouth 2 (two) times daily.  30 tablet   1  . verapamil (VERELAN PM) 180 MG 24 hr capsule Take 1 capsule (180 mg total) by mouth at bedtime.  30 capsule  2  . lisinopril-hydrochlorothiazide (PRINZIDE,ZESTORETIC) 20-12.5 MG per tablet Take 1 tablet by mouth daily.  31 tablet  0   No current facility-administered medications for this visit.    Review of Systems Review of Systems  Constitutional: Positive for fatigue and unexpected weight change (30 pounds after 6 mo). Negative for fever and chills.  HENT: Negative for hearing loss, congestion, sore throat, trouble swallowing and voice change.   Eyes: Negative for visual disturbance.  Respiratory: Negative for cough and wheezing.   Cardiovascular: Negative for chest pain, palpitations and leg swelling.  Gastrointestinal: Positive for nausea, vomiting and abdominal pain. Negative for diarrhea, constipation, blood in stool, abdominal distention and anal bleeding.  Genitourinary: Negative for hematuria, vaginal bleeding and difficulty urinating.  Musculoskeletal: Negative for arthralgias.       +jt pain  Skin: Negative for rash and wound.  Neurological: Positive for numbness (in hands and feet) and headaches. Negative for seizures and syncope.  Hematological: Negative for adenopathy. Does not bruise/bleed easily.  Psychiatric/Behavioral: Negative for confusion.    Blood pressure 112/70, pulse 72, resp. rate 16, height 5\' 4"  (1.626 m), weight 204 lb (92.534 kg).  Physical Exam Physical Exam  Vitals reviewed. Constitutional: She is oriented to person, place,  and time. She appears well-developed and well-nourished. No distress.  Obese   HENT:  Head: Normocephalic and atraumatic.  Right Ear: External ear normal.  Left Ear: External ear normal.  Eyes: Conjunctivae are normal. No scleral icterus.  Neck: Neck supple. No tracheal deviation present. No thyromegaly present.  Cardiovascular: Normal rate, regular rhythm and normal heart sounds.   Pulmonary/Chest: Effort normal and  breath sounds normal. No respiratory distress. She has no wheezes.  Abdominal: Soft. She exhibits no distension. There is no tenderness. There is no rebound and no guarding.  Musculoskeletal: She exhibits no edema.  Lymphadenopathy:    She has no cervical adenopathy.  Neurological: She is alert and oriented to person, place, and time.  Skin: Skin is warm and dry. No rash noted. She is not diaphoretic. No erythema.  Psychiatric: She has a normal mood and affect. Her behavior is normal. Judgment and thought content normal.    Data Reviewed ED note from 2/4 nml CMET except for K 3.4 nml CBC  COMPLETE ABDOMINAL ULTRASOUND 06/03/12 Comparison: None.  Findings:  Gallbladder: Multiple shadowing gallstones present with diffusely  sludge filled gallbladder. No gallbladder wall thickening.  Murphy's sign is positive. Findings are nonspecific but are  consistent with acute cholecystitis in the appropriate clinical  setting.  Common bile duct: Extrahepatic bile duct diameter is just over  upper limits of normal 88 mm. The duct is only segmentally  visualized and intraductal stones cannot be excluded.  Liver: Somewhat limited visualization of the liver due to rib  shadowing. Liver parenchymal pattern appears to be somewhat  inhomogeneous which may represent focal areas of fatty  infiltration. No definite focal mass lesion.  IVC: Appears normal.  Pancreas: Visualized portions of the head and body of the pancreas  are unremarkable.  Spleen: Spleen length measures 8.1 cm. Normal parenchymal  echotexture.  Right Kidney: Right kidney measures 10.4 cm in length. No  hydronephrosis. 14 mm hypoechoic focus in the lower pole likely  representing a small cyst.  Left Kidney: The left kidney measures 10.9 cm length. No  hydronephrosis.  Abdominal aorta: Visualized portions of the abdominal aorta are  normal in caliber. Distal aorta is not well seen.  IMPRESSION:  Stones and sludge in the gallbladder  with positive Murphy's sign.  Findings are nonspecific but are consistent with acute  cholecystitis in the appropriate clinical setting. Mild dilatation  of extrahepatic bile ducts. Bile duct stones are not excluded.   Assessment    Symptomatic cholelithiasis     Plan    I believe the patient's symptoms are consistent with gallbladder disease.  We discussed gallbladder disease. The patient was given Agricultural engineer. We discussed non-operative and operative management. We discussed the signs & symptoms of acute cholecystitis  I discussed laparoscopic cholecystectomy with IOC in detail.  The patient was given educational material as well as diagrams detailing the procedure.  We discussed the risks and benefits of a laparoscopic cholecystectomy including, but not limited to bleeding, infection, injury to surrounding structures such as the intestine or liver, bile leak, retained gallstones, need to convert to an open procedure, prolonged diarrhea, blood clots such as  DVT, common bile duct injury, anesthesia risks, and possible need for additional procedures.  We discussed the typical post-operative recovery course. I explained that the likelihood of improvement of their symptoms is good.  The patient will be scheduled for laparoscopic cholecystectomy in the near future  Petro Talent M. Andrey Campanile, MD, FACS General, Bariatric, & Minimally Invasive Surgery [facility] [place]          [facility] M 07/14/2012, 1:28 PM

## 2012-07-18 NOTE — Interval H&P Note (Signed)
History and Physical Interval Note:  07/18/2012 9:24 AM  Veronica [name]  has presented today for surgery, with the diagnosis of symptomatic cholelithiasis  The various methods of treatment have been discussed with the patient and family. After consideration of risks, benefits and other options for treatment, the patient has consented to  Procedure(s): LAPAROSCOPIC CHOLECYSTECTOMY WITH INTRAOPERATIVE CHOLANGIOGRAM (N/A) as a surgical intervention .  The patient's history has been reviewed, patient examined, no change in status, stable for surgery.  I have reviewed the patient's chart and labs.  Questions were answered to the patient's satisfaction.     Mary Sella. Andrey Campanile, MD, FACS General, Bariatric, & Minimally Invasive Surgery [facility], [place]  [facility] M

## 2012-07-21 ENCOUNTER — Encounter (HOSPITAL_COMMUNITY): Payer: Self-pay | Admitting: General Surgery

## 2012-07-23 ENCOUNTER — Telehealth (INDEPENDENT_AMBULATORY_CARE_PROVIDER_SITE_OTHER): Payer: Self-pay | Admitting: General Surgery

## 2012-07-23 ENCOUNTER — Inpatient Hospital Stay (HOSPITAL_COMMUNITY): Payer: Medicaid Other

## 2012-07-23 ENCOUNTER — Inpatient Hospital Stay (HOSPITAL_COMMUNITY)
Admission: EM | Admit: 2012-07-23 | Discharge: 2012-07-25 | DRG: 948 | Disposition: A | Discharge status: Home / Self Care | Payer: Medicaid Other | Attending: General Surgery | Admitting: General Surgery

## 2012-07-23 ENCOUNTER — Ambulatory Visit: Payer: Self-pay | Admitting: Family Medicine

## 2012-07-23 ENCOUNTER — Encounter (INDEPENDENT_AMBULATORY_CARE_PROVIDER_SITE_OTHER): Payer: PRIVATE HEALTH INSURANCE | Admitting: General Surgery

## 2012-07-23 ENCOUNTER — Encounter (HOSPITAL_COMMUNITY): Payer: Self-pay | Admitting: Adult Health

## 2012-07-23 DIAGNOSIS — M129 Arthropathy, unspecified: Secondary | ICD-10-CM | POA: Diagnosis present

## 2012-07-23 DIAGNOSIS — M329 Systemic lupus erythematosus, unspecified: Secondary | ICD-10-CM | POA: Diagnosis present

## 2012-07-23 DIAGNOSIS — R112 Nausea with vomiting, unspecified: Secondary | ICD-10-CM | POA: Diagnosis present

## 2012-07-23 DIAGNOSIS — Z90710 Acquired absence of both cervix and uterus: Secondary | ICD-10-CM

## 2012-07-23 DIAGNOSIS — Y838 Other surgical procedures as the cause of abnormal reaction of the patient, or of later complication, without mention of misadventure at the time of the procedure: Secondary | ICD-10-CM | POA: Diagnosis present

## 2012-07-23 DIAGNOSIS — R1011 Right upper quadrant pain: Secondary | ICD-10-CM

## 2012-07-23 DIAGNOSIS — G8918 Other acute postprocedural pain: Secondary | ICD-10-CM

## 2012-07-23 DIAGNOSIS — Z9049 Acquired absence of other specified parts of digestive tract: Secondary | ICD-10-CM

## 2012-07-23 DIAGNOSIS — I10 Essential (primary) hypertension: Secondary | ICD-10-CM | POA: Diagnosis present

## 2012-07-23 DIAGNOSIS — Z8249 Family history of ischemic heart disease and other diseases of the circulatory system: Secondary | ICD-10-CM

## 2012-07-23 DIAGNOSIS — Z9089 Acquired absence of other organs: Secondary | ICD-10-CM

## 2012-07-23 DIAGNOSIS — T889XXS Complication of surgical and medical care, unspecified, sequela: Secondary | ICD-10-CM

## 2012-07-23 LAB — CBC WITH DIFFERENTIAL/PLATELET
Basophils Absolute: 0 10*3/uL (ref 0.0–0.1)
Basophils Relative: 0 % (ref 0–1)
Eosinophils Absolute: 0 10*3/uL (ref 0.0–0.7)
Eosinophils Relative: 0 % (ref 0–5)
HCT: 45.9 % (ref 36.0–46.0)
HCT: 37.6 % (ref 36.0–46.0)
Hemoglobin: 14.3 g/dL (ref 12.0–15.0)
Hemoglobin: 13 g/dL (ref 12.0–15.0)
Lymphocytes Relative: 11 % — ABNORMAL LOW (ref 12–46)
Lymphocytes Relative: 13 % (ref 12–46)
Lymphs Abs: 1 10*3/uL (ref 0.7–4.0)
Lymphs Abs: 1.4 10*3/uL (ref 0.7–4.0)
MCH: 27.1 pg (ref 26.0–34.0)
MCH: 28.6 pg (ref 26.0–34.0)
MCHC: 31 g/dL (ref 30.0–36.0)
MCHC: 34.6 g/dL (ref 30.0–36.0)
MCV: 87.2 fL (ref 78.0–100.0)
MCV: 82.8 fL (ref 78.0–100.0)
Monocytes Absolute: 0.2 10*3/uL (ref 0.1–1.0)
Monocytes Absolute: 0.7 10*3/uL (ref 0.1–1.0)
Monocytes Relative: 2 % — ABNORMAL LOW (ref 3–12)
Monocytes Relative: 6 % (ref 3–12)
Neutro Abs: 8.4 10*3/uL — ABNORMAL HIGH (ref 1.7–7.7)
Neutro Abs: 8.8 10*3/uL — ABNORMAL HIGH (ref 1.7–7.7)
Neutrophils Relative %: 87 % — ABNORMAL HIGH (ref 43–77)
Neutrophils Relative %: 81 % — ABNORMAL HIGH (ref 43–77)
Platelets: 242 10*3/uL (ref 150–400)
Platelets: 277 10*3/uL (ref 150–400)
RBC: 5.27 MIL/uL — ABNORMAL HIGH (ref 3.87–5.11)
RBC: 4.54 MIL/uL (ref 3.87–5.11)
RDW: 13.7 % (ref 11.5–15.5)
RDW: 13.5 % (ref 11.5–15.5)
WBC: 9.6 10*3/uL (ref 4.0–10.5)
WBC: 10.9 10*3/uL — ABNORMAL HIGH (ref 4.0–10.5)

## 2012-07-23 LAB — COMPREHENSIVE METABOLIC PANEL
ALT: 13 U/L (ref 0–35)
ALT: 186 U/L — ABNORMAL HIGH (ref 0–35)
AST: 22 U/L (ref 0–37)
AST: 440 U/L — ABNORMAL HIGH (ref 0–37)
Albumin: 4.1 g/dL (ref 3.5–5.2)
Albumin: 3.6 g/dL (ref 3.5–5.2)
Alkaline Phosphatase: 52 U/L (ref 39–117)
Alkaline Phosphatase: 192 U/L — ABNORMAL HIGH (ref 39–117)
BUN: 11 mg/dL (ref 6–23)
BUN: 10 mg/dL (ref 6–23)
CO2: 20 mEq/L (ref 19–32)
CO2: 26 mEq/L (ref 19–32)
Calcium: 9.8 mg/dL (ref 8.4–10.5)
Calcium: 9.2 mg/dL (ref 8.4–10.5)
Chloride: 102 mEq/L (ref 96–112)
Chloride: 105 mEq/L (ref 96–112)
Creat: 0.81 mg/dL (ref 0.50–1.10)
Creatinine, Ser: 0.8 mg/dL (ref 0.50–1.10)
GFR calc Af Amer: >90 mL/min (ref >90)
GFR calc non Af Amer: 85 mL/min — ABNORMAL LOW (ref >90)
Glucose, Bld: 123 mg/dL — ABNORMAL HIGH (ref 70–99)
Glucose, Bld: 118 mg/dL — ABNORMAL HIGH (ref 70–99)
Potassium: 3.8 mEq/L (ref 3.5–5.3)
Potassium: 3.4 mEq/L — ABNORMAL LOW (ref 3.5–5.1)
Sodium: 140 mEq/L (ref 135–145)
Sodium: 140 mEq/L (ref 135–145)
Total Bilirubin: 0.2 mg/dL — ABNORMAL LOW (ref 0.3–1.2)
Total Bilirubin: 0.9 mg/dL (ref 0.3–1.2)
Total Protein: 8.6 g/dL — ABNORMAL HIGH (ref 6.0–8.3)
Total Protein: 7.7 g/dL (ref 6.0–8.3)

## 2012-07-23 LAB — LIPASE, BLOOD: Lipase: 19 U/L (ref 11–59)

## 2012-07-23 LAB — URINALYSIS, ROUTINE W REFLEX MICROSCOPIC
Bilirubin Urine: NEGATIVE
Glucose, UA: NEGATIVE mg/dL
Hgb urine dipstick: NEGATIVE
Ketones, ur: NEGATIVE mg/dL
Leukocytes, UA: NEGATIVE
Nitrite: NEGATIVE
Protein, ur: NEGATIVE mg/dL
Specific Gravity, Urine: 1.02 (ref 1.005–1.030)
Urobilinogen, UA: 0.2 mg/dL (ref 0.0–1.0)
pH: 7 (ref 5.0–8.0)

## 2012-07-23 MED ORDER — AMLODIPINE BESYLATE 10 MG PO TABS
10.0000 mg | ORAL_TABLET | Freq: Every day | ORAL | Status: DC
  Administered 2012-07-24: 10 mg via ORAL
  Filled 2012-07-23 (×2): qty 1

## 2012-07-23 MED ORDER — TOPIRAMATE 25 MG PO TABS
50.0000 mg | ORAL_TABLET | Freq: Two times a day (BID) | ORAL | Status: DC
  Administered 2012-07-24 (×3): 50 mg via ORAL
  Filled 2012-07-23 (×5): qty 2

## 2012-07-23 MED ORDER — POTASSIUM CHLORIDE IN NACL 20-0.9 MEQ/L-% IV SOLN
INTRAVENOUS | Status: DC
  Administered 2012-07-24 (×2): via INTRAVENOUS
  Filled 2012-07-23 (×6): qty 1000

## 2012-07-23 MED ORDER — IOHEXOL 300 MG/ML  SOLN
100.0000 mL | Freq: Once | INTRAMUSCULAR | Status: AC | PRN
  Administered 2012-07-23: 100 mL via INTRAVENOUS

## 2012-07-23 MED ORDER — LISINOPRIL-HYDROCHLOROTHIAZIDE 20-12.5 MG PO TABS: 1.0000 | ORAL_TABLET | Freq: Two times a day (BID) | ORAL | Status: DC

## 2012-07-23 MED ORDER — VERAPAMIL HCL ER 180 MG PO TBCR
180.0000 mg | EXTENDED_RELEASE_TABLET | Freq: Every day | ORAL | Status: DC
  Administered 2012-07-24 (×2): 180 mg via ORAL
  Filled 2012-07-23 (×3): qty 1

## 2012-07-23 MED ORDER — IOHEXOL 300 MG/ML  SOLN
50.0000 mL | Freq: Once | INTRAMUSCULAR | Status: AC | PRN
  Administered 2012-07-23: 50 mL via ORAL

## 2012-07-23 MED ORDER — HYDROMORPHONE HCL PF 1 MG/ML IJ SOLN
1.0000 mg | Freq: Once | INTRAMUSCULAR | Status: AC
  Administered 2012-07-23: 1 mg via INTRAVENOUS
  Filled 2012-07-23: qty 1

## 2012-07-23 MED ORDER — LISINOPRIL 20 MG PO TABS
20.0000 mg | ORAL_TABLET | Freq: Every day | ORAL | Status: DC
  Administered 2012-07-24: 20 mg via ORAL
  Filled 2012-07-23 (×2): qty 1

## 2012-07-23 MED ORDER — VERAPAMIL HCL ER 180 MG PO CP24
180.0000 mg | ORAL_CAPSULE | Freq: Every day | ORAL | Status: DC
Start: 2012-07-23 — End: 2012-07-23

## 2012-07-23 MED ORDER — PANTOPRAZOLE SODIUM 40 MG IV SOLR
40.0000 mg | Freq: Every day | INTRAVENOUS | Status: DC
  Administered 2012-07-24 (×2): 40 mg via INTRAVENOUS
  Filled 2012-07-23 (×3): qty 40

## 2012-07-23 MED ORDER — SODIUM CHLORIDE 0.9 % IV BOLUS (SEPSIS)
1000.0000 mL | Freq: Once | INTRAVENOUS | Status: AC
  Administered 2012-07-23: 1000 mL via INTRAVENOUS

## 2012-07-23 MED ORDER — HYDROCHLOROTHIAZIDE 12.5 MG PO CAPS
12.5000 mg | ORAL_CAPSULE | Freq: Every day | ORAL | Status: DC
  Administered 2012-07-24 (×2): 12.5 mg via ORAL
  Filled 2012-07-23 (×4): qty 1

## 2012-07-23 MED ORDER — ONDANSETRON HCL 4 MG/2ML IJ SOLN: 4.0000 mg | Freq: Four times a day (QID) | INTRAMUSCULAR | Status: DC | PRN

## 2012-07-23 MED ORDER — HYDROCHLOROTHIAZIDE 12.5 MG PO CAPS: 12.5000 mg | ORAL_CAPSULE | Freq: Every day | ORAL | Status: DC

## 2012-07-23 MED ORDER — HYDROMORPHONE HCL PF 1 MG/ML IJ SOLN
1.0000 mg | INTRAMUSCULAR | Status: DC | PRN
  Administered 2012-07-24: 1 mg via INTRAVENOUS
  Filled 2012-07-23: qty 1

## 2012-07-23 MED ORDER — LISINOPRIL 20 MG PO TABS
20.0000 mg | ORAL_TABLET | Freq: Every day | ORAL | Status: DC
  Administered 2012-07-24: 20 mg via ORAL
  Filled 2012-07-23: qty 1

## 2012-07-23 MED ORDER — ENOXAPARIN SODIUM 40 MG/0.4ML ~~LOC~~ SOLN
40.0000 mg | SUBCUTANEOUS | Status: DC
  Administered 2012-07-24 (×2): 40 mg via SUBCUTANEOUS
  Filled 2012-07-23 (×3): qty 0.4

## 2012-07-23 MED ORDER — ONDANSETRON 4 MG PO TBDP
8.0000 mg | ORAL_TABLET | Freq: Once | ORAL | Status: AC
  Administered 2012-07-23: 8 mg via ORAL
  Filled 2012-07-23: qty 2

## 2012-07-23 MED ORDER — ONDANSETRON HCL 4 MG/2ML IJ SOLN
4.0000 mg | Freq: Once | INTRAMUSCULAR | Status: AC
  Administered 2012-07-23: 4 mg via INTRAVENOUS
  Filled 2012-07-23: qty 2

## 2012-07-23 NOTE — Telephone Encounter (Addendum)
Diarrhea not unexpected. Is N/v after taking a pain med?  Is n/v isolated or several occurrence?  offer her an urgent office slot today and get stat CMET and CBC prior to urgent.

## 2012-07-23 NOTE — ED Notes (Addendum)
Post op from cholecystectomy Thursday, today at 3 am began having nausea, vomiting and right sided abdominal pain. , bowel sounds hypoactive, last BM at 2 pm this afternoon. Unable to eat and drink.  Dr. Andrey Campanile is Careers adviser.

## 2012-07-23 NOTE — ED Notes (Signed)
Re-drew light green top and sent down to the lab.

## 2012-07-23 NOTE — ED Notes (Signed)
Patient done with contrast, CT called.  Patient is scheduled for exam at 2115.

## 2012-07-23 NOTE — Telephone Encounter (Signed)
Patient called in explaining that she had a lap chole 3/21 and she started having diarrhea, n/v, expected fever and chills (patient claims she does not have a thermometer).  She explains that the pain is constant at the incision and the Percocet is not touching the pain.  There is no redness or swelling.  Informed her that I would get a plan of action from Dr. Andrey Campanile, and that we would give her a call back.

## 2012-07-23 NOTE — ED Provider Notes (Signed)
History     CSN: [id number]  Arrival date & time 07/23/12  1629   First MD Initiated Contact with Patient 07/23/12 1706      Chief Complaint  Patient presents with  . Post-op Problem    (Consider location/radiation/quality/duration/timing/severity/associated sxs/prior treatment) HPI Veronica [name] is a 52 year old female with a past history of HTN who presents to the ED with nausea, vomiting, and abdominal pain. She is s/p cholecystectomy 5 days ago. She denies any complications from surgery and was feeling well outside of some mild pain at the surgical sites. This morning she awoke to severe abdominal pain and nausea around 4 am. She took half a percocet at that time to help with the pain. She reports having fever and chills, but did not take her temperature. She denies any drainage, erythema, edema, and streaking at the surgical sites. She has had 8 episodes of emesis and diarrhea today. She states her vomit has been white colored. Her stool has been yellow. She denies hematochezia, melena, and hematemesis. She had another half pill of percocet around 12:30, which helped relieve the pain since she fell asleep. She called in to her surgeon's office and went in around 4 pm. While in the waiting room she had a couple episodes of emesis, so they told her to come to the ED. Currently she rates the pain 10/10 and it radiates from her RUQ to her LUQ. She states the pain has been worsening throughout the day. She has not been able to eat today. She has had some ginger ale, but most of it has come back up.   Past Medical History  Diagnosis Date  . Hypertension   . SLE (systemic lupus erythematosus) 1998  . Headache   . Hx of dizziness   . Arthritis   . History of blood transfusion     Past Surgical History  Procedure Laterality Date  . Abdominal hysterectomy  09/2008  . Cesarean section  1992, 1995  . Cholecystectomy N/A 07/18/2012    Procedure: LAPAROSCOPIC CHOLECYSTECTOMY WITH INTRAOPERATIVE  CHOLANGIOGRAM;  Surgeon: Atilano Ina, MD;  Location: [facility] OR;  Service: General;  Laterality: N/A;    Family History  Problem Relation Age of Onset  . Diabetes Mother   . Multiple sclerosis Sister   . Hypertension Father   . Hypertension Brother     History  Substance Use Topics  . Smoking status: Never Smoker   . Smokeless tobacco: Never Used  . Alcohol Use: No    OB History   Grav Para Term Preterm Abortions TAB SAB Ect Mult Living                  Review of Systems All other systems negative except as documented in the HPI. All pertinent positives and negatives as reviewed in the HPI.  Allergies  Review of patient's allergies indicates no known allergies.  Home Medications   Current Outpatient Rx  Name  Route  Sig  Dispense  Refill  . amLODipine (NORVASC) 10 MG tablet   Oral   Take 1 tablet (10 mg total) by mouth daily.   30 tablet   2   . lisinopril-hydrochlorothiazide (PRINZIDE,ZESTORETIC) 20-12.5 MG per tablet   Oral   Take 1 tablet by mouth 2 (two) times daily.         . Multiple Vitamin (MULTIVITAMIN WITH MINERALS) TABS   Oral   Take 1 tablet by mouth daily.         Marland Kitchen  oxyCODONE-acetaminophen (ROXICET) 5-325 MG per tablet   Oral   Take 1-2 tablets by mouth every 4 (four) hours as needed for pain.   40 tablet   0   . potassium chloride (K-DUR) 10 MEQ tablet   Oral   Take 2 tablets (20 mEq total) by mouth 2 (two) times daily.   60 tablet   2   . topiramate (TOPAMAX) 50 MG tablet   Oral   Take 1 tablet (50 mg total) by mouth 2 (two) times daily.   30 tablet   1   . verapamil (VERELAN PM) 180 MG 24 hr capsule   Oral   Take 1 capsule (180 mg total) by mouth at bedtime.   30 capsule   2     BP 128/80  Pulse 78  Temp(Src) 98.1 F (36.7 C) (Oral)  Resp 22  SpO2 100%  Physical Exam  Nursing note and vitals reviewed. Constitutional: She is oriented to person, place, and time. She appears well-developed and well-nourished.  Obese   HENT:  Head: Normocephalic and atraumatic.  Mouth/Throat: Oropharynx is clear and moist.  Eyes: Pupils are equal, round, and reactive to light.  Neck: Normal range of motion. Neck supple.  Cardiovascular: Normal rate, regular rhythm and normal heart sounds.  Exam reveals no gallop and no friction rub.   No murmur heard. Pulmonary/Chest: Effort normal and breath sounds normal. No respiratory distress.  Abdominal: Soft. She exhibits no distension and no mass. Bowel sounds are decreased. There is tenderness in the right upper quadrant, epigastric area and left upper quadrant. There is no rebound and no guarding.  Neurological: She is alert and oriented to person, place, and time. She exhibits normal muscle tone. Coordination normal.  Skin: Skin is warm and dry. No rash noted. No erythema.    ED Course  Procedures (including critical care time)  Labs Reviewed  CBC WITH DIFFERENTIAL - Abnormal; Notable for the following:    WBC 10.9 (*)    Neutrophils Relative 81 (*)    Neutro Abs 8.8 (*)    All other components within normal limits  COMPREHENSIVE METABOLIC PANEL - Abnormal; Notable for the following:    Potassium 3.4 (*)    Glucose, Bld 118 (*)    AST 440 (*)    ALT 186 (*)    Alkaline Phosphatase 192 (*)    GFR calc non Af Amer 85 (*)    All other components within normal limits  URINALYSIS, ROUTINE W REFLEX MICROSCOPIC - Abnormal; Notable for the following:    APPearance CLOUDY (*)    All other components within normal limits  LIPASE, BLOOD   The patient will be admitted by Dr. Rayburn Ma MD. The patient has been stable here in the ER.    MDM          Carlyle Dolly, PA-C 07/23/12 2102  Carlyle Dolly, PA-C 07/24/12 1625

## 2012-07-23 NOTE — Telephone Encounter (Signed)
Patient showed up for urgent office appt.  Patient was sitting in the lobby lethargic and vomiting.  Spoke to Jerome MD and he agreed that sending patient to the ED was the best plan for the patient due to the uncontrollable vomiting.  Patient agreeable with POC at this time and going to [facility] ED.

## 2012-07-23 NOTE — H&P (Signed)
Veronica [name] is an 52 y.o. female.   Chief Complaint: Abdominal pain, nausea, vomiting HPI: This is a pleasant female who underwent a laparoscopic cholecystectomy and cholangiogram by Dr. Gaynelle Adu on March 21. She had been doing well until early this morning when she had the sudden onset of abdominal pain and developed nausea and vomiting. She had laboratory data done this morning which showed normal liver function tests. However, after presenting to the emergency department, repeat labs showed an elevated AST, ALT, and alkaline phosphatase. Bilirubin is normal. Originally, she described her pain as sharp and 10 out of 10. She has improved after pain medication and is currently comfortable. Up until this morning she had had no nausea, vomiting, or abdominal pain and her bowel movements were normal.  Past Medical History  Diagnosis Date  . Hypertension   . SLE (systemic lupus erythematosus) 1998  . Headache   . Hx of dizziness   . Arthritis   . History of blood transfusion     Past Surgical History  Procedure Laterality Date  . Abdominal hysterectomy  09/2008  . Cesarean section  1992, 1995  . Cholecystectomy N/A 07/18/2012    Procedure: LAPAROSCOPIC CHOLECYSTECTOMY WITH INTRAOPERATIVE CHOLANGIOGRAM;  Surgeon: Atilano Ina, MD;  Location: [facility] OR;  Service: General;  Laterality: N/A;    Family History  Problem Relation Age of Onset  . Diabetes Mother   . Multiple sclerosis Sister   . Hypertension Father   . Hypertension Brother    Social History:  reports that she has never smoked. She has never used smokeless tobacco. She reports that she does not drink alcohol or use illicit drugs.  Allergies: No Known Allergies   (Not in a hospital admission)  Results for orders placed during the hospital encounter of 07/23/12 (from the past 48 hour(s))  CBC WITH DIFFERENTIAL     Status: Abnormal   Collection Time    07/23/12  5:04 PM      Result Value Range   WBC 10.9 (*) 4.0 - 10.5  K/uL   RBC 4.54  3.87 - 5.11 MIL/uL   Hemoglobin 13.0  12.0 - 15.0 g/dL   HCT 16.1  09.6 - 04.5 %   MCV 82.8  78.0 - 100.0 fL   MCH 28.6  26.0 - 34.0 pg   MCHC 34.6  30.0 - 36.0 g/dL   RDW 40.9  81.1 - 91.4 %   Platelets 277  150 - 400 K/uL   Neutrophils Relative 81 (*) 43 - 77 %   Neutro Abs 8.8 (*) 1.7 - 7.7 K/uL   Lymphocytes Relative 13  12 - 46 %   Lymphs Abs 1.4  0.7 - 4.0 K/uL   Monocytes Relative 6  3 - 12 %   Monocytes Absolute 0.7  0.1 - 1.0 K/uL   Eosinophils Relative 0  0 - 5 %   Eosinophils Absolute 0.0  0.0 - 0.7 K/uL   Basophils Relative 0  0 - 1 %   Basophils Absolute 0.0  0.0 - 0.1 K/uL  COMPREHENSIVE METABOLIC PANEL     Status: Abnormal   Collection Time    07/23/12  5:27 PM      Result Value Range   Sodium 140  135 - 145 mEq/L   Potassium 3.4 (*) 3.5 - 5.1 mEq/L   Chloride 105  96 - 112 mEq/L   CO2 26  19 - 32 mEq/L   Glucose, Bld 118 (*) 70 - 99 mg/dL  BUN 10  6 - 23 mg/dL   Creatinine, Ser 1.61  0.50 - 1.10 mg/dL   Calcium 9.2  8.4 - 09.6 mg/dL   Total Protein 7.7  6.0 - 8.3 g/dL   Albumin 3.6  3.5 - 5.2 g/dL   AST 045 (*) 0 - 37 U/L   ALT 186 (*) 0 - 35 U/L   Alkaline Phosphatase 192 (*) 39 - 117 U/L   Total Bilirubin 0.9  0.3 - 1.2 mg/dL   GFR calc non Af Amer 85 (*) >90 mL/min   GFR calc Af Amer >90  >90 mL/min   Comment:            The eGFR has been calculated     using the CKD EPI equation.     This calculation has not been     validated in all clinical     situations.     eGFR's persistently     <90 mL/min signify     possible Chronic Kidney Disease.  LIPASE, BLOOD     Status: None   Collection Time    07/23/12  5:27 PM      Result Value Range   Lipase 19  11 - 59 U/L  URINALYSIS, ROUTINE W REFLEX MICROSCOPIC     Status: Abnormal   Collection Time    07/23/12  6:03 PM      Result Value Range   Color, Urine YELLOW  YELLOW   APPearance CLOUDY (*) CLEAR   Specific Gravity, Urine 1.020  1.005 - 1.030   pH 7.0  5.0 - 8.0   Glucose,  UA NEGATIVE  NEGATIVE mg/dL   Hgb urine dipstick NEGATIVE  NEGATIVE   Bilirubin Urine NEGATIVE  NEGATIVE   Ketones, ur NEGATIVE  NEGATIVE mg/dL   Protein, ur NEGATIVE  NEGATIVE mg/dL   Urobilinogen, UA 0.2  0.0 - 1.0 mg/dL   Nitrite NEGATIVE  NEGATIVE   Leukocytes, UA NEGATIVE  NEGATIVE   Comment: MICROSCOPIC NOT DONE ON URINES WITH NEGATIVE PROTEIN, BLOOD, LEUKOCYTES, NITRITE, OR GLUCOSE <1000 mg/dL.   No results found.  Review of Systems  All other systems reviewed and are negative.    Blood pressure 128/80, pulse 78, temperature 98.1 F (36.7 C), temperature source Oral, resp. rate 22, SpO2 100.00%. Physical Exam  Constitutional: She is oriented to person, place, and time. She appears well-developed and well-nourished. No distress.  HENT:  Head: Normocephalic and atraumatic.  Right Ear: External ear normal.  Left Ear: External ear normal.  Nose: Nose normal.  Mouth/Throat: Oropharynx is clear and moist.  Eyes: Conjunctivae are normal. Pupils are equal, round, and reactive to light. No scleral icterus.  Neck: Normal range of motion. Neck supple. No tracheal deviation present.  Cardiovascular: Normal rate, regular rhythm, normal heart sounds and intact distal pulses.   Respiratory: Effort normal and breath sounds normal. No respiratory distress. She has no wheezes.  GI: Soft. Bowel sounds are normal. She exhibits no distension. There is no guarding.  There is minimal tenderness in the right upper quadrant on examination.  Musculoskeletal: Normal range of motion.  Neurological: She is alert and oriented to person, place, and time.  Skin: Skin is warm and dry.  Psychiatric: Her behavior is normal. Judgment normal.     Assessment/Plan Postoperative abdominal pain, nausea, and vomiting after laparoscopic cholecystectomy  A CAT scan of the abdomen and pelvis has been ordered. The patient will be admitted to the hospital for IV rehydration. Also order a HIDA  scan for the morning  to evaluate for a bile leak.  Kortez Murtagh A 07/23/2012, 9:04 PM

## 2012-07-23 NOTE — Addendum Note (Signed)
Addended byLiliana Cline on: 07/23/2012 09:35 AM   Modules accepted: Orders

## 2012-07-23 NOTE — Telephone Encounter (Signed)
Spoke with patient. She is having RUQ pain - pain medication not helping. Vomiting has been constant since 3:00 am. Patient to have STAT labs drawn at [facility] today and come into urgent office this pm per Dr Andrey Campanile. If pain gets worse she is to go to the ER and let us know she is doing this. She will be in touch or follow up this afternoon.

## 2012-07-24 ENCOUNTER — Inpatient Hospital Stay (HOSPITAL_COMMUNITY): Payer: Medicaid Other

## 2012-07-24 LAB — COMPREHENSIVE METABOLIC PANEL
ALT: 620 U/L — ABNORMAL HIGH (ref 0–35)
AST: 1011 U/L — ABNORMAL HIGH (ref 0–37)
Albumin: 3.2 g/dL — ABNORMAL LOW (ref 3.5–5.2)
Alkaline Phosphatase: 219 U/L — ABNORMAL HIGH (ref 39–117)
BUN: 8 mg/dL (ref 6–23)
CO2: 26 mEq/L (ref 19–32)
Calcium: 8.8 mg/dL (ref 8.4–10.5)
Chloride: 105 mEq/L (ref 96–112)
Creatinine, Ser: 0.9 mg/dL (ref 0.50–1.10)
GFR calc Af Amer: 86 mL/min — ABNORMAL LOW (ref >90)
GFR calc non Af Amer: 74 mL/min — ABNORMAL LOW (ref >90)
Glucose, Bld: 105 mg/dL — ABNORMAL HIGH (ref 70–99)
Potassium: 3.4 mEq/L — ABNORMAL LOW (ref 3.5–5.1)
Sodium: 139 mEq/L (ref 135–145)
Total Bilirubin: 1.3 mg/dL — ABNORMAL HIGH (ref 0.3–1.2)
Total Protein: 7.3 g/dL (ref 6.0–8.3)

## 2012-07-24 LAB — CBC
HCT: 35.9 % — ABNORMAL LOW (ref 36.0–46.0)
Hemoglobin: 11.7 g/dL — ABNORMAL LOW (ref 12.0–15.0)
MCH: 28.1 pg (ref 26.0–34.0)
MCHC: 32.6 g/dL (ref 30.0–36.0)
MCV: 86.3 fL (ref 78.0–100.0)
Platelets: 268 10*3/uL (ref 150–400)
RBC: 4.16 MIL/uL (ref 3.87–5.11)
RDW: 13.5 % (ref 11.5–15.5)
WBC: 5 10*3/uL (ref 4.0–10.5)

## 2012-07-24 MED ORDER — OXYCODONE-ACETAMINOPHEN 5-325 MG PO TABS: 1.0000 | ORAL_TABLET | ORAL | Status: DC | PRN

## 2012-07-24 MED ORDER — ENSURE COMPLETE PO LIQD
237.0000 mL | Freq: Two times a day (BID) | ORAL | Status: DC
Start: 2012-07-25 — End: 2012-07-25

## 2012-07-24 MED ORDER — TECHNETIUM TC 99M MEBROFENIN IV KIT
5.0000 | PACK | Freq: Once | INTRAVENOUS | Status: AC | PRN
  Administered 2012-07-24: 5 via INTRAVENOUS

## 2012-07-24 MED ORDER — OXYCODONE-ACETAMINOPHEN 5-325 MG PO TABS
1.0000 | ORAL_TABLET | ORAL | Status: DC | PRN
  Administered 2012-07-24 (×2): 2 via ORAL
  Filled 2012-07-24 (×2): qty 2

## 2012-07-24 NOTE — Progress Notes (Signed)
Rn went in to do patient and patient's family was concerned as to why the patient was being dc tonight. Rn explained to patient and family that the MD felt that the patient was stable to go home. Pt kept saying that if her n/v/d started back that it wouldn't be until 0300 in the morning like it happens every night. Rn called Dr. Donell Beers and explained situation to her about concerns. Orders placed to cancel dc for tonight.

## 2012-07-24 NOTE — Discharge Summary (Signed)
Patient ID: Veronica [name] MRN: [id number] DOB/AGE: [birth year] 52 y.o.  Admit date: 07/23/2012 Discharge date: 07/24/2012   Admitting Diagnosis: Abdominal pain, N/V  Discharge Diagnosis Patient Active Problem List   Diagnosis Date Noted  . Symptomatic cholelithiasis 07/11/2012  . Essential hypertension, benign 04/10/2012  . SLE (systemic lupus erythematosus) 04/10/2012  Abdominal pain - resolved Nausea and vomiting - resolved  Consultants None  Imaging: Ct Abdomen Pelvis W Contrast  07/23/2012  *RADIOLOGY REPORT*  Clinical Data: Right upper quadrant pain.  Nausea and vomiting. Recent gallbladder surgery.  CT ABDOMEN AND PELVIS WITH CONTRAST  Technique:  Multidetector CT imaging of the abdomen and pelvis was performed following the standard protocol during bolus administration of intravenous contrast.  Contrast: OMNIPAQUE IOHEXOL 300 MG/ML  SOLN  Comparison: Ultrasound 06/03/2012.  Findings: Lung Bases: Marked dependent atelectasis, greater on the right than left.  No airspace disease.  The heart grossly appears normal allowing for lung volumes.  Liver:  Tiny amount of intrahepatic biliary ductal dilation, commonly seen after cholecystectomy.  No mass lesions.  Spleen:  Normal.  Gallbladder:  Surgically absent.  Mild stranding is present in the gallbladder fossa compatible with recent cholecystectomy.  No fluid collections.  Common bile duct:  Normal.  No calcified common duct stone identified.  Pancreas:  Normal.  Adrenal glands:  Normal.  Kidneys:  Normal enhancement and delayed excretion of contrast.  In the right inferior renal pole, there is a 13 mm nonenhancing renal cysts.  Both ureters appear within normal limits.  Stomach:  Distended with oral contrast.  Small bowel:  Normal duodenum.  Small bowel appears within normal limits.  No mesenteric adenopathy.  No fluid collections.  No inflammatory changes.  Colon:   Normal appendix.  Moderate stool burden in the proximal colon.   Colonic diverticulosis is present distally.  Pelvic Genitourinary:  Small amount of free fluid is present in the anatomic pelvis, likely physiologic.  Uterus is surgically absent. Urinary bladder appears normal.  No adenopathy.  Large fluid collections are present in the labia compatible with Bartholin gland cysts.  Bones:  No aggressive osseous lesions.  Vasculature: Normal.  Stranding is present in the periumbilical region compatible with prior laparoscopic port.  Notably, and no pneumoperitoneum following cholecystectomy.  IMPRESSION:  1.  Postoperative findings compatible with recent cholecystectomy. No fluid collection/abscess.  Mild intrahepatic biliary ductal dilation.  Correlation with bilirubin recommended.  This is commonly seen after cholecystectomy however common duct injury cannot be excluded. 2.  Straightening of the laparoscopic portal site in the expected following cholecystectomy. 3.  Small amount of free fluid in the anatomic pelvis may be postoperative or physiologic. 4.  13 mm right inferior pole renal cyst.   Original Report Authenticated By: Andreas Newport, M.D.     Procedures None  Hospital Course:  52 y/o AAF who presented to [facility] with abdominal pain, nausea, and vomiting.  She is s/p cholecystectomy on 07/18/2012 for chronic cholecystitis with cholelithiasis.  Workup showed elevated AST/ALT and alk.phos., and a normal bilirubin and lipase.  Patient was admitted for additional workup of her pain.  Her CT scan showed normal postoperative findings, no fluid collection/abscess.  Also a HIDA scan was obtained which showed no bile leak.  Diet was advanced as tolerated following HIDA scan.  On HD #2 (POD #6), the patient was voiding well, tolerating diet, ambulating well, pain well controlled, vital signs stable, incisions c/d/i and felt stable for discharge home.  Patient will follow up in our  office on 08/04/2012 at 1:45 pm and knows to call with questions or concerns.  Physical  Exam: General:  Alert, NAD, pleasant, comfortable Abd:  Soft, ND, mild tenderness, incisions C/D/I    Medication List    TAKE these medications       amLODipine 10 MG tablet  Commonly known as:  NORVASC  Take 1 tablet (10 mg total) by mouth daily.     lisinopril-hydrochlorothiazide 20-12.5 MG per tablet  Commonly known as:  PRINZIDE,ZESTORETIC  Take 1 tablet by mouth 2 (two) times daily.     multivitamin with minerals Tabs  Take 1 tablet by mouth daily.     oxyCODONE-acetaminophen 5-325 MG per tablet  Commonly known as:  ROXICET  Take 1-2 tablets by mouth every 4 (four) hours as needed for pain.     oxyCODONE-acetaminophen 5-325 MG per tablet  Commonly known as:  ROXICET  Take 1-2 tablets by mouth every 4 (four) hours as needed.     potassium chloride 10 MEQ tablet  Commonly known as:  K-DUR  Take 2 tablets (20 mEq total) by mouth 2 (two) times daily.     topiramate 50 MG tablet  Commonly known as:  TOPAMAX  Take 1 tablet (50 mg total) by mouth 2 (two) times daily.     verapamil 180 MG 24 hr capsule  Commonly known as:  VERELAN PM  Take 1 capsule (180 mg total) by mouth at bedtime.             Follow-up Information   Follow up with Atilano Ina, MD On 08/04/2012. (Appt is at 1:45 pm. Please arrive at 1:15 for check-in)    Contact information:   [address] [place] [address] [phone]       Signed: Aris Georgia, PA-C  07/24/2012, 7:45 AM

## 2012-07-24 NOTE — Progress Notes (Signed)
Patient examined and I agree with the assessment and plan  Violeta Gelinas, MD, MPH, FACS Pager: [phone]  07/24/2012 10:50 PM

## 2012-07-24 NOTE — Progress Notes (Signed)
UR completed 

## 2012-07-24 NOTE — Discharge Summary (Signed)
Oma Marzan, MD, MPH, FACS Pager: [phone]  

## 2012-07-24 NOTE — Progress Notes (Signed)
INITIAL NUTRITION ASSESSMENT  DOCUMENTATION CODES Per approved criteria  -Obesity Unspecified   INTERVENTION: 1.  Supplements; Ensure Complete po BID, each supplement provides 350 kcal and 13 grams of protein.  NUTRITION DIAGNOSIS: Inadequate oral intake related to pain, poor appetite as evidenced by pt report.   Monitor:  1.  Food/Beverage; improvement in intake sufficient to meet >/=90% estimated needs. 2.  Wt/wt change; deter loss  Reason for Assessment: MST  52 y.o. female  Admitting Dx: N/V, abdominal pain  ASSESSMENT: Pt admitted for nausea, vomiting, and abdominal pain.  Pt with h/o of gall bladder removal in March.  Pt was in her usual state of health prior to onset of acute symptoms. Per chart review, pt has lost 15% of her usual wt over the past year.   Pt not available at time of visit- for HIDA scan.  Note pt may be d/c'd pending results.  Recommend nutrition supplementation at home until wt and pain stablize.  Height: Ht Readings from Last 1 Encounters:  07/23/12 5\' 4"  (1.626 m)    Weight: Wt Readings from Last 1 Encounters:  07/23/12 202 lb 2.6 oz (91.7 kg)    Ideal Body Weight: 120 lbs  % Ideal Body Weight: 168%  Wt Readings from Last 10 Encounters:  07/23/12 202 lb 2.6 oz (91.7 kg)  07/16/12 204 lb 12.9 oz (92.9 kg)  07/11/12 204 lb (92.534 kg)  04/10/12 219 lb 12.8 oz (99.701 kg)  04/03/11 234 lb (106.142 kg)    Usual Body Weight: 230-235 lbs, last weighed 1 year ago  % Usual Body Weight: 85%  BMI:  Body mass index is 34.68 kg/(m^2).  Estimated Nutritional Needs: Kcal: 1820-2090 Protein: 90-109g Fluid: ~2.0 L/day  Skin: abdominal incision, otherwise intact  Diet Order: Dysphagia 3, thin  EDUCATION NEEDS: -Education not appropriate at this time   Intake/Output Summary (Last 24 hours) at 07/24/12 1342 Last data filed at 07/24/12 0604  Gross per 24 hour  Intake    750 ml  Output      0 ml  Net    750 ml    Last BM:  3/26  Labs:   Recent Labs Lab 07/23/12 0934 07/23/12 1727 07/24/12 0525  NA 140 140 139  K 3.8 3.4* 3.4*  CL 102 105 105  CO2 20 26 26   BUN 11 10 8   CREATININE 0.81 0.80 0.90  CALCIUM 9.8 9.2 8.8  GLUCOSE 123* 118* 105*    CBG (last 3)  No results found for this basename: GLUCAP,  in the last 72 hours  Scheduled Meds: . amLODipine  10 mg Oral Daily  . enoxaparin (LOVENOX) injection  40 mg Subcutaneous Q24H  . hydrochlorothiazide  12.5 mg Oral Daily   And  . lisinopril  20 mg Oral Daily  . pantoprazole (PROTONIX) IV  40 mg Intravenous QHS  . topiramate  50 mg Oral BID  . verapamil  180 mg Oral QHS    Continuous Infusions: . 0.9 % NaCl with KCl 20 mEq / L 125 mL/hr at 07/24/12 9562    Past Medical History  Diagnosis Date  . Hypertension   . SLE (systemic lupus erythematosus) 1998  . Headache   . Hx of dizziness   . Arthritis   . History of blood transfusion     Past Surgical History  Procedure Laterality Date  . Abdominal hysterectomy  09/2008  . Cesarean section  1992, 1995  . Cholecystectomy N/A 07/18/2012    Procedure: LAPAROSCOPIC CHOLECYSTECTOMY WITH  INTRAOPERATIVE CHOLANGIOGRAM;  Surgeon: Atilano Ina, MD;  Location: [facility] OR;  Service: General;  Laterality: N/A;    Loyce Dys, MS RD LDN Clinical Inpatient Dietitian Pager: [phone] Weekend/After hours pager: [phone]

## 2012-07-24 NOTE — Progress Notes (Signed)
Patient ID: Veronica [name], female   DOB: 1961/03/27, 52 y.o.   MRN: [id number]  LOS: 1 day   Subjective: Pt. Feeling better since last night. She has not had any N/V this morning. She is still complaining of pain.   Objective: Vital signs in last 24 hours: Temp:  [97.7 F (36.5 C)-98.1 F (36.7 C)] 98 F (36.7 C) (03/27 0606) Pulse Rate:  [70-84] 70 (03/27 0606) Resp:  [16-24] 16 (03/27 0606) BP: (112-148)/(69-83) 112/74 mmHg (03/27 0606) SpO2:  [97 %-100 %] 98 % (03/27 0606) Weight:  [202 lb 2.6 oz (91.7 kg)] 202 lb 2.6 oz (91.7 kg) (03/26 2323)     Laboratory  CBC  Recent Labs  07/23/12 1704 07/24/12 0525  WBC 10.9* 5.0  HGB 13.0 11.7*  HCT 37.6 35.9*  PLT 277 268   BMET  Recent Labs  07/23/12 1727 07/24/12 0525  NA 140 139  K 3.4* 3.4*  CL 105 105  CO2 26 26  GLUCOSE 118* 105*  BUN 10 8  CREATININE 0.80 0.90  CALCIUM 9.2 8.8   CT Abdomen Pelvis W Contrast IMPRESSION:  1. Postoperative findings compatible with recent cholecystectomy.  No fluid collection/abscess. Mild intrahepatic biliary ductal  dilation. Correlation with bilirubin recommended. This is  commonly seen after cholecystectomy however common duct injury  cannot be excluded.  2. Straightening of the laparoscopic portal site in the expected  following cholecystectomy.  3. Small amount of free fluid in the anatomic pelvis may be  postoperative or physiologic.  4. 13 mm right inferior pole renal cyst.   Physical Exam General appearance: alert, cooperative and resting comfortably in bed GI: soft, non-tender; bowel sounds normal; no masses,  no organomegaly and very mild TTP in RUQ, no guarding Incision/Wound:healing with no signs of infection/inflammation   Assessment/Plan: 1. RUQ pain with vomiting  Plan:  1. Waiting for results of HIDA scan 2. Soft diet following HIDA scan 3. If HIDA is negative, pt tolerates diet, and N/pain are controlled will look to D/C    Ralene Muskrat,  PA-S  07/24/2012

## 2012-07-25 ENCOUNTER — Ambulatory Visit: Payer: Self-pay | Admitting: Family Medicine

## 2012-07-25 MED ORDER — PROMETHAZINE HCL 12.5 MG PO TABS: 12.5000 mg | ORAL_TABLET | Freq: Four times a day (QID) | ORAL | Status: DC | PRN

## 2012-07-25 NOTE — Discharge Summary (Signed)
Addendum to D/C summary on 07/24/12:  The patient was hesitant about being discharged yesterday due to intermittent episodes of nausea and abdominal pain. She had a small episode 1 hour after eating yesterday's dinner, but none since.  She feels good about going home now.  The patient is stable to be discharged this morning.  Physical: General appearance: awake, alert, pleasant GI: soft, non-tender; BS normal; no masses Incision/Wound:healing with no signs of infection/inflammation

## 2012-07-25 NOTE — Discharge Summary (Signed)
Santino Kinsella, MD, MPH, FACS Pager: [phone]  

## 2012-07-27 NOTE — ED Provider Notes (Signed)
Medical screening examination/treatment/procedure(s) were conducted as a shared visit with non-physician practitioner(s) and myself.  I personally evaluated the patient during the encounter  Pt seen/examined, here with abdominal pain, s/p cholecystectomy.  After discussion with PA, decision to order CT imaging and consult surgery.  Surgery made aware of patient and admission planned  Joya Gaskins, MD 07/27/12 [phone]

## 2012-07-28 ENCOUNTER — Telehealth (INDEPENDENT_AMBULATORY_CARE_PROVIDER_SITE_OTHER): Payer: Self-pay | Admitting: General Surgery

## 2012-07-28 ENCOUNTER — Encounter (HOSPITAL_COMMUNITY): Payer: Self-pay | Admitting: *Deleted

## 2012-07-28 ENCOUNTER — Inpatient Hospital Stay (HOSPITAL_COMMUNITY)
Admission: AD | Admit: 2012-07-28 | Discharge: 2012-07-30 | DRG: 445 | Disposition: A | Discharge status: Home / Self Care | Payer: Medicaid Other | Source: Ambulatory Visit | Attending: General Surgery | Admitting: General Surgery

## 2012-07-28 ENCOUNTER — Telehealth (INDEPENDENT_AMBULATORY_CARE_PROVIDER_SITE_OTHER): Payer: Self-pay

## 2012-07-28 ENCOUNTER — Other Ambulatory Visit (INDEPENDENT_AMBULATORY_CARE_PROVIDER_SITE_OTHER): Payer: Self-pay | Admitting: General Surgery

## 2012-07-28 DIAGNOSIS — R7989 Other specified abnormal findings of blood chemistry: Secondary | ICD-10-CM

## 2012-07-28 DIAGNOSIS — K831 Obstruction of bile duct: Secondary | ICD-10-CM | POA: Diagnosis present

## 2012-07-28 DIAGNOSIS — Z8249 Family history of ischemic heart disease and other diseases of the circulatory system: Secondary | ICD-10-CM

## 2012-07-28 DIAGNOSIS — N179 Acute kidney failure, unspecified: Secondary | ICD-10-CM | POA: Diagnosis present

## 2012-07-28 DIAGNOSIS — E876 Hypokalemia: Secondary | ICD-10-CM | POA: Diagnosis present

## 2012-07-28 DIAGNOSIS — R112 Nausea with vomiting, unspecified: Secondary | ICD-10-CM | POA: Diagnosis present

## 2012-07-28 DIAGNOSIS — K8051 Calculus of bile duct without cholangitis or cholecystitis with obstruction: Principal | ICD-10-CM | POA: Diagnosis present

## 2012-07-28 DIAGNOSIS — M329 Systemic lupus erythematosus, unspecified: Secondary | ICD-10-CM | POA: Diagnosis present

## 2012-07-28 DIAGNOSIS — Z90710 Acquired absence of both cervix and uterus: Secondary | ICD-10-CM

## 2012-07-28 DIAGNOSIS — Z9089 Acquired absence of other organs: Secondary | ICD-10-CM

## 2012-07-28 DIAGNOSIS — I10 Essential (primary) hypertension: Secondary | ICD-10-CM | POA: Diagnosis present

## 2012-07-28 DIAGNOSIS — E669 Obesity, unspecified: Secondary | ICD-10-CM | POA: Diagnosis present

## 2012-07-28 DIAGNOSIS — Z833 Family history of diabetes mellitus: Secondary | ICD-10-CM

## 2012-07-28 DIAGNOSIS — Z9049 Acquired absence of other specified parts of digestive tract: Secondary | ICD-10-CM

## 2012-07-28 DIAGNOSIS — Z6836 Body mass index (BMI) 36.0-36.9, adult: Secondary | ICD-10-CM

## 2012-07-28 LAB — CBC WITH DIFFERENTIAL/PLATELET
Basophils Absolute: 0 10*3/uL (ref 0.0–0.1)
Basophils Relative: 0 % (ref 0–1)
Eosinophils Absolute: 0 10*3/uL (ref 0.0–0.7)
Eosinophils Relative: 0 % (ref 0–5)
HCT: 38.4 % (ref 36.0–46.0)
Hemoglobin: 12.9 g/dL (ref 12.0–15.0)
Lymphocytes Relative: 23 % (ref 12–46)
Lymphs Abs: 1.8 10*3/uL (ref 0.7–4.0)
MCH: 28.9 pg (ref 26.0–34.0)
MCHC: 33.6 g/dL (ref 30.0–36.0)
MCV: 85.9 fL (ref 78.0–100.0)
Monocytes Absolute: 0.6 10*3/uL (ref 0.1–1.0)
Monocytes Relative: 7 % (ref 3–12)
Neutro Abs: 5.3 10*3/uL (ref 1.7–7.7)
Neutrophils Relative %: 69 % (ref 43–77)
Platelets: 313 10*3/uL (ref 150–400)
RBC: 4.47 MIL/uL (ref 3.87–5.11)
RDW: 13.8 % (ref 11.5–15.5)
WBC: 7.7 10*3/uL (ref 4.0–10.5)

## 2012-07-28 LAB — COMPREHENSIVE METABOLIC PANEL
ALT: 897 U/L — ABNORMAL HIGH (ref 0–35)
AST: 538 U/L — ABNORMAL HIGH (ref 0–37)
Albumin: 4.1 g/dL (ref 3.5–5.2)
Alkaline Phosphatase: 253 U/L — ABNORMAL HIGH (ref 39–117)
BUN: 9 mg/dL (ref 6–23)
CO2: 22 mEq/L (ref 19–32)
Calcium: 10.4 mg/dL (ref 8.4–10.5)
Chloride: 106 mEq/L (ref 96–112)
Creat: 1.1 mg/dL (ref 0.50–1.10)
Glucose, Bld: 116 mg/dL — ABNORMAL HIGH (ref 70–99)
Potassium: 2.8 mEq/L — ABNORMAL LOW (ref 3.5–5.3)
Sodium: 142 mEq/L (ref 135–145)
Total Bilirubin: 6 mg/dL — ABNORMAL HIGH (ref 0.3–1.2)
Total Protein: 8.3 g/dL (ref 6.0–8.3)

## 2012-07-28 LAB — HEPATIC FUNCTION PANEL
ALT: 766 U/L — ABNORMAL HIGH (ref 0–35)
AST: 521 U/L — ABNORMAL HIGH (ref 0–37)
Albumin: 4.2 g/dL (ref 3.5–5.2)
Alkaline Phosphatase: 216 U/L — ABNORMAL HIGH (ref 39–117)
Bilirubin, Direct: 3.7 mg/dL — ABNORMAL HIGH (ref 0.0–0.3)
Indirect Bilirubin: 1.7 mg/dL — ABNORMAL HIGH (ref 0.0–0.9)
Total Bilirubin: 5.4 mg/dL — ABNORMAL HIGH (ref 0.3–1.2)
Total Protein: 8.2 g/dL (ref 6.0–8.3)

## 2012-07-28 LAB — LIPASE, BLOOD: Lipase: 40 U/L (ref 11–59)

## 2012-07-28 MED ORDER — HEPARIN SODIUM (PORCINE) 5000 UNIT/ML IJ SOLN
5000.0000 [IU] | Freq: Once | INTRAMUSCULAR | Status: AC
  Administered 2012-07-28: 5000 [IU] via SUBCUTANEOUS
  Filled 2012-07-28: qty 1

## 2012-07-28 MED ORDER — ONDANSETRON HCL 4 MG/2ML IJ SOLN
4.0000 mg | Freq: Four times a day (QID) | INTRAMUSCULAR | Status: DC | PRN
  Administered 2012-07-28 – 2012-07-30 (×3): 4 mg via INTRAVENOUS
  Filled 2012-07-28 (×3): qty 2

## 2012-07-28 MED ORDER — CIPROFLOXACIN IN D5W 400 MG/200ML IV SOLN
400.0000 mg | Freq: Two times a day (BID) | INTRAVENOUS | Status: DC
  Administered 2012-07-28 – 2012-07-30 (×4): 400 mg via INTRAVENOUS
  Filled 2012-07-28 (×5): qty 200

## 2012-07-28 MED ORDER — POTASSIUM CHLORIDE 10 MEQ/100ML IV SOLN
10.0000 meq | INTRAVENOUS | Status: AC
  Administered 2012-07-28 (×2): 10 meq via INTRAVENOUS
  Filled 2012-07-28 (×3): qty 100

## 2012-07-28 MED ORDER — AMLODIPINE BESYLATE 10 MG PO TABS
10.0000 mg | ORAL_TABLET | Freq: Every day | ORAL | Status: DC
  Administered 2012-07-30: 10 mg via ORAL
  Filled 2012-07-28 (×2): qty 1

## 2012-07-28 MED ORDER — DIPHENHYDRAMINE HCL 12.5 MG/5ML PO ELIX
12.5000 mg | ORAL_SOLUTION | Freq: Four times a day (QID) | ORAL | Status: DC | PRN
  Administered 2012-07-29: 12.5 mg via ORAL
  Filled 2012-07-28: qty 5

## 2012-07-28 MED ORDER — PROMETHAZINE HCL 25 MG/ML IJ SOLN: 12.5000 mg | Freq: Four times a day (QID) | INTRAMUSCULAR | Status: DC | PRN

## 2012-07-28 MED ORDER — TOPIRAMATE 25 MG PO TABS
50.0000 mg | ORAL_TABLET | Freq: Two times a day (BID) | ORAL | Status: DC
  Administered 2012-07-28 – 2012-07-30 (×4): 50 mg via ORAL
  Filled 2012-07-28 (×5): qty 2

## 2012-07-28 MED ORDER — PANTOPRAZOLE SODIUM 40 MG IV SOLR
40.0000 mg | Freq: Every day | INTRAVENOUS | Status: DC
  Administered 2012-07-28 – 2012-07-30 (×3): 40 mg via INTRAVENOUS
  Filled 2012-07-28 (×3): qty 40

## 2012-07-28 MED ORDER — VERAPAMIL HCL ER 180 MG PO TBCR
180.0000 mg | EXTENDED_RELEASE_TABLET | Freq: Every day | ORAL | Status: DC
  Administered 2012-07-28: 180 mg via ORAL
  Filled 2012-07-28 (×3): qty 1

## 2012-07-28 MED ORDER — SODIUM CHLORIDE 0.9 % IV SOLN
INTRAVENOUS | Status: DC
  Administered 2012-07-28: 22:00:00 via INTRAVENOUS

## 2012-07-28 MED ORDER — DIPHENHYDRAMINE HCL 50 MG/ML IJ SOLN: 12.5000 mg | Freq: Four times a day (QID) | INTRAMUSCULAR | Status: DC | PRN

## 2012-07-28 MED ORDER — POTASSIUM CHLORIDE 10 MEQ/100ML IV SOLN
10.0000 meq | Freq: Once | INTRAVENOUS | Status: AC
  Administered 2012-07-28: 10 meq via INTRAVENOUS

## 2012-07-28 MED ORDER — SODIUM CHLORIDE 0.9 % IV SOLN: INTRAVENOUS | Status: DC

## 2012-07-28 MED ORDER — KCL IN DEXTROSE-NACL 20-5-0.45 MEQ/L-%-% IV SOLN
INTRAVENOUS | Status: DC
  Administered 2012-07-28 – 2012-07-30 (×5): via INTRAVENOUS
  Filled 2012-07-28 (×8): qty 1000

## 2012-07-28 MED ORDER — MORPHINE SULFATE 2 MG/ML IJ SOLN
1.0000 mg | INTRAMUSCULAR | Status: DC | PRN
  Administered 2012-07-28 – 2012-07-29 (×4): 2 mg via INTRAVENOUS
  Filled 2012-07-28 (×4): qty 1

## 2012-07-28 NOTE — Telephone Encounter (Signed)
Patient needs labs drawn per Dr Andrey Campanile. Hepatic panel and CMET ordered STAT and patient made aware to have drawn at [facility] lab. We will call with results.

## 2012-07-28 NOTE — Consult Note (Signed)
UNASSIGNED GI Reason for Consult: ?CBD stone-abnormal LFT's. Referring Physician: CCS-Eric Andrey Campanile, MD  Veronica [name] is an 52 y.o. female.  HPI: 52 year old black female, s/p laparoscopic cholecystectomy for chronic cholecystitis on 07/18/12 admitted to the Hospital again on 03/26-3/28 for RUQ pain, nausea and vomiting. Had a essentially unrevealing CT of the abdomne and pelvis and a normal HIDA scan with no evidence of a bile leak. Evalauted today for ongoing symptoms and now admitted with a Bilirubin of 6. GI consultation is requested for an ERCP. When I went to see the patient she was sitting on the commode vomiting and was not able to get me much history. Most of the details are from my review of the chart.    Past Medical History  Diagnosis Date  . Hypertension   . SLE (systemic lupus erythematosus) 1998  . Headache   . Hx of dizziness   . Arthritis   . History of blood transfusion    Past Surgical History  Procedure Laterality Date  . Abdominal hysterectomy  09/2008  . Cesarean section  1992, 1995  . Cholecystectomy N/A 07/18/2012    Procedure: LAPAROSCOPIC CHOLECYSTECTOMY WITH INTRAOPERATIVE CHOLANGIOGRAM;  Surgeon: Atilano Ina, MD;  Location: [facility] OR;  Service: General;  Laterality: N/A;   Family History  Problem Relation Age of Onset  . Diabetes Mother   . Multiple sclerosis Sister   . Hypertension Father   . Hypertension Brother    Social History:  reports that she has never smoked. She has never used smokeless tobacco. She reports that she does not drink alcohol or use illicit drugs.  Allergies: No Known Allergies  Medications: I have reviewed the patient's current medications.  Results for orders placed during the hospital encounter of 07/28/12 (from the past 48 hour(s))  CBC WITH DIFFERENTIAL     Status: None   Collection Time    07/28/12  3:28 PM      Result Value Range   WBC 7.7  4.0 - 10.5 K/uL   RBC 4.47  3.87 - 5.11 MIL/uL   Hemoglobin 12.9  12.0 - 15.0  g/dL   HCT 56.2  13.0 - 86.5 %   MCV 85.9  78.0 - 100.0 fL   MCH 28.9  26.0 - 34.0 pg   MCHC 33.6  30.0 - 36.0 g/dL   RDW 78.4  69.6 - 29.5 %   Platelets 313  150 - 400 K/uL   Neutrophils Relative 69  43 - 77 %   Neutro Abs 5.3  1.7 - 7.7 K/uL   Lymphocytes Relative 23  12 - 46 %   Lymphs Abs 1.8  0.7 - 4.0 K/uL   Monocytes Relative 7  3 - 12 %   Monocytes Absolute 0.6  0.1 - 1.0 K/uL   Eosinophils Relative 0  0 - 5 %   Eosinophils Absolute 0.0  0.0 - 0.7 K/uL   Basophils Relative 0  0 - 1 %   Basophils Absolute 0.0  0.0 - 0.1 K/uL  LIPASE, BLOOD     Status: None   Collection Time    07/28/12  3:28 PM      Result Value Range   Lipase 40  11 - 59 U/L   No results found.  Review of Systems  Constitutional: Negative for fever, chills, weight loss, malaise/fatigue and diaphoresis.  HENT: Negative.   Gastrointestinal: Positive for nausea, vomiting and abdominal pain. Negative for diarrhea, constipation, blood in stool and melena.  Genitourinary: Negative.   Musculoskeletal: Positive for myalgias, back pain and joint pain.  Skin: Negative.   Neurological: Negative.  Negative for weakness.  Psychiatric/Behavioral: Negative.    Blood pressure 130/87, pulse 71, temperature 99.2 F (37.3 C), temperature source Oral, resp. rate 14, height 5\' 4"  (1.626 m), weight 92.534 kg (204 lb), SpO2 100.00%. Physical Exam  Constitutional: She is oriented to person, place, and time. She appears well-developed and well-nourished. She has a sickly appearance. She appears distressed.  HENT:  Head: Normocephalic and atraumatic.  Eyes: EOM are normal. Right conjunctiva is injected. Scleral icterus is present.  Neck: Normal range of motion.  Cardiovascular: Normal rate and regular rhythm.   Respiratory: Effort normal.  GI: Soft. Bowel sounds are normal. She exhibits no mass. There is tenderness. There is guarding. There is no rebound.  Musculoskeletal: Normal range of motion.  Neurological: She is  alert and oriented to person, place, and time.  Skin: Skin is warm and dry.   Assessment/Plan: 1) Recurrent RUQ pain with nausea, vomiting and abnormal LFT's: ERCP planned for tomorrow. Orders for ERCP reviewed.  Agree with coverage with Ciprofloxacin for now.  2) HTN on Verapamil and Norvasc. 3) SLE.   [name] 07/28/2012, 7:12 PM

## 2012-07-28 NOTE — H&P (Signed)
Veronica [name] is an 52 y.o. female.   Chief Complaint: nausea, vomiting, abd pain HPI: 52 year old [place] American female who underwent laparoscopic cholecystectomy with interoperative cholangiogram for chronic cholecystitis on March 21 Called into the office this morning complaining of ongoing nausea, vomiting, and right upper quadrant abdominal pain. I ordered a comprehensive metabolic panel which revealed worsening elevated liver function tests. The patient's bilirubin was now 6. I contacted the patient and advised her to be admitted to the hospital for workup.  Of note, the patient was admitted to the hospital on March 26 through March 28 for same symptoms. She presented with nausea, vomiting, and right upper quadrant pain. She had a CT scan of her abdomen as well as a HIDA scan last week. The CT scan only showed some mild intrahepatic ductal dilatation which was thought to be Post cholecystectomy in nature. Her nuclear medicine scan did not demonstrate any evidence of bile leak and her radiotracer emptied into her small bowel.  Past Medical History  Diagnosis Date  . Hypertension   . SLE (systemic lupus erythematosus) 1998  . Headache   . Hx of dizziness   . Arthritis   . History of blood transfusion     Past Surgical History  Procedure Laterality Date  . Abdominal hysterectomy  09/2008  . Cesarean section  1992, 1995  . Cholecystectomy N/A 07/18/2012    Procedure: LAPAROSCOPIC CHOLECYSTECTOMY WITH INTRAOPERATIVE CHOLANGIOGRAM;  Surgeon: Atilano Ina, MD;  Location: [facility] OR;  Service: General;  Laterality: N/A;    Family History  Problem Relation Age of Onset  . Diabetes Mother   . Multiple sclerosis Sister   . Hypertension Father   . Hypertension Brother    Social History:  reports that she has never smoked. She has never used smokeless tobacco. She reports that she does not drink alcohol or use illicit drugs.  Allergies: No Known Allergies  Medications Prior to Admission    Medication Sig Dispense Refill  . amLODipine (NORVASC) 10 MG tablet Take 1 tablet (10 mg total) by mouth daily.  30 tablet  2  . lisinopril-hydrochlorothiazide (PRINZIDE,ZESTORETIC) 20-12.5 MG per tablet Take 1 tablet by mouth 2 (two) times daily.      . Multiple Vitamin (MULTIVITAMIN WITH MINERALS) TABS Take 1 tablet by mouth daily.      . potassium chloride (K-DUR) 10 MEQ tablet Take 2 tablets (20 mEq total) by mouth 2 (two) times daily.  60 tablet  2  . topiramate (TOPAMAX) 50 MG tablet Take 1 tablet (50 mg total) by mouth 2 (two) times daily.  30 tablet  1  . verapamil (VERELAN PM) 180 MG 24 hr capsule Take 1 capsule (180 mg total) by mouth at bedtime.  30 capsule  2    Results for orders placed in visit on 07/28/12 (from the past 48 hour(s))  COMPREHENSIVE METABOLIC PANEL     Status: Abnormal   Collection Time    07/28/12 12:00 AM      Result Value Range   Sodium 142  135 - 145 mEq/L   Potassium 2.8 (*) 3.5 - 5.3 mEq/L   Chloride 106  96 - 112 mEq/L   CO2 22  19 - 32 mEq/L   Glucose, Bld 116 (*) 70 - 99 mg/dL   BUN 9  6 - 23 mg/dL   Creat 4.09  8.11 - 9.14 mg/dL   Total Bilirubin 6.0 (*) 0.3 - 1.2 mg/dL   Alkaline Phosphatase 253 (*) 39 - 117  U/L   AST 538 (*) 0 - 37 U/L   ALT 897 (*) 0 - 35 U/L   Total Protein 8.3  6.0 - 8.3 g/dL   Albumin 4.1  3.5 - 5.2 g/dL   Calcium 96.0  8.4 - 45.4 mg/dL   No results found.  Review of Systems  Constitutional: Positive for chills and weight loss. Negative for fever.  HENT: Negative for hearing loss, nosebleeds and ear discharge.   Eyes: Negative for blurred vision and double vision.  Respiratory: Negative for shortness of breath.   Cardiovascular: Negative for chest pain, palpitations, leg swelling and PND.  Gastrointestinal: Positive for nausea, vomiting, abdominal pain and constipation.  Genitourinary: Negative for dysuria and urgency.  Musculoskeletal: Positive for back pain. Negative for myalgias.  Neurological: Positive for  weakness. Negative for dizziness, tingling, focal weakness, seizures and loss of consciousness.  Psychiatric/Behavioral: Negative for substance abuse. The patient is not nervous/anxious.     There were no vitals taken for this visit. Physical Exam  Vitals reviewed. Constitutional: She is oriented to person, place, and time. She appears well-developed and well-nourished.  Non-toxic appearance. No distress.  HENT:  Head: Normocephalic and atraumatic.  Right Ear: External ear normal.  Left Ear: External ear normal.  Eyes: Conjunctivae are normal. Scleral icterus is present.  Neck: No tracheal deviation present. No thyromegaly present.  Cardiovascular: Normal rate, regular rhythm and normal heart sounds.   Respiratory: Effort normal and breath sounds normal. No respiratory distress. She has no wheezes.  GI: Soft. Bowel sounds are normal. She exhibits no distension. There is tenderness (very mild RUQ TTP) in the right upper quadrant. There is no rebound and no guarding.    Incisions c/d/i  Musculoskeletal: She exhibits no edema.  Lymphadenopathy:    She has no cervical adenopathy.  Neurological: She is alert and oriented to person, place, and time.  Skin: Skin is warm and dry. No rash noted. She is not diaphoretic. No erythema. No pallor.  Psychiatric: She has a normal mood and affect. Her behavior is normal. Judgment and thought content normal.     Assessment/Plan Elevated LFTs status post laparoscopic cholecystectomy with cholangiogram on March 21 Hypokalemia HTN Lupus  Although radiotracer emptied into her small bowel on her nuclear medicine scan I am concerned the patient has a common bile duct stone. I did have to milk out at least 5 or 6 stones out of her cystic duct in order to thread the cholangiogram catheter So I have a strong suspicion she may have dropped stone.  I will place the patient on antibiotics because the possibility of an obstructed common bile duct We will  replace her potassium and she will be placed on IV fluids. I will also order a CBC and lipase for this afternoon. I spoke with Dr. Loreta Ave who will see her later this afternoon. Tentative plan is for ERCP tomorrow. She will get 1 dose of subcutaneous heparin tonight and stop in anticipation of invasive procedure for tomorrow. I spent time with the patient and her husband this afternoon detailing the workup to date as well as the rest of the plan and drawn diagrams. All of their questions were asked and answered.  Mary Sella. Andrey Campanile, MD, FACS General, Bariatric, & Minimally Invasive Surgery [facility], [place]   [facility] M 07/28/2012, 2:58 PM

## 2012-07-28 NOTE — Telephone Encounter (Signed)
LFTs from today are more elevated. Concerned for biliary obstruction. Discussed LFTs with pt. Advised pt she needs to be directed to [facility] for workup. Informed her my nurse would call her with details. Pt voiced understanding.

## 2012-07-28 NOTE — Telephone Encounter (Signed)
Reviewed chart from recent postop hospitalization, normal HIDA and CT. However, LFTs increased on HD2. Pt still having intermittent emesis several hrs after eating. Drinking/eating liquids. Still with some RUQ pain under breast. Will check repeat CMET today. If LFTs elevated, will need ERCP/MRCP

## 2012-07-28 NOTE — Telephone Encounter (Signed)
The pt called reporting she was discharged on Friday.  She is still having chills, pain on the right side and vomiting.  She is taking Phenergen and Percocet.  The Percocet just knocks her out.  I paged Dr Andrey Campanile.  He recommends stopping the Phenergan and order Zofan 4 mg po q 6 prn #40 no refill.  He thinks the Phenergan may be what is knocking her out.  I notified the pt and told her I will call it in and to stop the Phenergan.  She asked if this is normal and will it get better.  I told her it should get better and the Zofran may help.  I told her the scans were negative in the hospital which was good.  I called the Zofran in to [facility] [phone].

## 2012-07-29 ENCOUNTER — Encounter (HOSPITAL_COMMUNITY)
Admission: AD | Disposition: A | Discharge status: Home / Self Care | Payer: Self-pay | Source: Ambulatory Visit | Attending: General Surgery

## 2012-07-29 ENCOUNTER — Encounter (HOSPITAL_COMMUNITY): Payer: Self-pay | Admitting: *Deleted

## 2012-07-29 ENCOUNTER — Observation Stay (HOSPITAL_COMMUNITY): Payer: Medicaid Other

## 2012-07-29 HISTORY — PX: ERCP: SHX5425

## 2012-07-29 LAB — COMPREHENSIVE METABOLIC PANEL
ALT: 640 U/L — ABNORMAL HIGH (ref 0–35)
AST: 363 U/L — ABNORMAL HIGH (ref 0–37)
Albumin: 3 g/dL — ABNORMAL LOW (ref 3.5–5.2)
Alkaline Phosphatase: 199 U/L — ABNORMAL HIGH (ref 39–117)
BUN: 11 mg/dL (ref 6–23)
CO2: 23 mEq/L (ref 19–32)
Calcium: 9.1 mg/dL (ref 8.4–10.5)
Chloride: 99 mEq/L (ref 96–112)
Creatinine, Ser: 1.43 mg/dL — ABNORMAL HIGH (ref 0.50–1.10)
GFR calc Af Amer: 49 mL/min — ABNORMAL LOW (ref >90)
GFR calc non Af Amer: 42 mL/min — ABNORMAL LOW (ref >90)
Glucose, Bld: 165 mg/dL — ABNORMAL HIGH (ref 70–99)
Potassium: 3.3 mEq/L — ABNORMAL LOW (ref 3.5–5.1)
Sodium: 133 mEq/L — ABNORMAL LOW (ref 135–145)
Total Bilirubin: 4.4 mg/dL — ABNORMAL HIGH (ref 0.3–1.2)
Total Protein: 6.9 g/dL (ref 6.0–8.3)

## 2012-07-29 LAB — CBC
HCT: 36.7 % (ref 36.0–46.0)
Hemoglobin: 12.2 g/dL (ref 12.0–15.0)
MCH: 28.4 pg (ref 26.0–34.0)
MCHC: 33.2 g/dL (ref 30.0–36.0)
MCV: 85.3 fL (ref 78.0–100.0)
Platelets: 292 10*3/uL (ref 150–400)
RBC: 4.3 MIL/uL (ref 3.87–5.11)
RDW: 14 % (ref 11.5–15.5)
WBC: 18.7 10*3/uL — ABNORMAL HIGH (ref 4.0–10.5)

## 2012-07-29 LAB — MAGNESIUM: Magnesium: 1.5 mg/dL (ref 1.5–2.5)

## 2012-07-29 SURGERY — ERCP, WITH INTERVENTION IF INDICATED
Anesthesia: Moderate Sedation

## 2012-07-29 MED ORDER — SODIUM CHLORIDE 0.9 % IV SOLN
INTRAVENOUS | Status: DC | PRN
  Administered 2012-07-29: 14:00:00

## 2012-07-29 MED ORDER — BUTAMBEN-TETRACAINE-BENZOCAINE 2-2-14 % EX AERO
INHALATION_SPRAY | CUTANEOUS | Status: DC | PRN
  Administered 2012-07-29: 2 via TOPICAL

## 2012-07-29 MED ORDER — FENTANYL CITRATE 0.05 MG/ML IJ SOLN
INTRAMUSCULAR | Status: DC | PRN
  Administered 2012-07-29 (×3): 25 ug via INTRAVENOUS

## 2012-07-29 MED ORDER — MIDAZOLAM HCL 10 MG/2ML IJ SOLN
INTRAMUSCULAR | Status: DC | PRN
  Administered 2012-07-29 (×3): 2.5 mg via INTRAVENOUS

## 2012-07-29 MED ORDER — ADULT MULTIVITAMIN W/MINERALS CH
1.0000 | ORAL_TABLET | Freq: Every day | ORAL | Status: DC
Start: 2012-07-29 — End: 2012-07-30
  Administered 2012-07-29 – 2012-07-30 (×2): 1 via ORAL
  Filled 2012-07-29 (×2): qty 1

## 2012-07-29 MED ORDER — DIPHENHYDRAMINE HCL 50 MG/ML IJ SOLN
INTRAMUSCULAR | Status: DC | PRN
  Administered 2012-07-29: 25 mg via INTRAVENOUS

## 2012-07-29 NOTE — Interval H&P Note (Signed)
History and Physical Interval Note:  07/29/2012 12:56 PM  Veronica [name]  has presented today for surgery, with the diagnosis of Choledocholithiasis  The various methods of treatment have been discussed with the patient and family. After consideration of risks, benefits and other options for treatment, the patient has consented to  Procedure(s) with comments: ENDOSCOPIC RETROGRADE CHOLANGIOPANCREATOGRAPHY (ERCP) (N/A) - Dr. Elnoria Howard said he would start this PT arond 1330( AW) as a surgical intervention .  The patient's history has been reviewed, patient examined, no change in status, stable for surgery.  I have reviewed the patient's chart and labs.  Questions were answered to the patient's satisfaction.     [name] D

## 2012-07-29 NOTE — H&P (View-Only) (Signed)
  Subjective: No major issues. Still N. No emesis. Still with mild RUQ pain.  WBC and Cr bumped overnight.   Objective: Vital signs in last 24 hours: Temp:  [99.2 F (37.3 C)-100.8 F (38.2 C)] 100.1 F (37.8 C) (04/01 0525) Pulse Rate:  [71-82] 75 (04/01 0525) Resp:  [14-20] 18 (04/01 0525) BP: (91-130)/(59-87) 91/59 mmHg (04/01 0525) SpO2:  [94 %-100 %] 94 % (04/01 0525) Weight:  [204 lb (92.534 kg)] 204 lb (92.534 kg) (03/31 1450) Last BM Date: 07/28/12  Intake/Output from previous day: 03/31 0701 - 04/01 0700 In: 2094.7 [P.O.:360; I.V.:1334.7; IV Piggyback:400] Out: 200 [Urine:200] Intake/Output this shift:    Asleep, easily arousable, NAD Soft, nd, obese. Very mild RUQ TTP No edema  Lab Results:   Recent Labs  07/28/12 1528 07/29/12 0520  WBC 7.7 18.7*  HGB 12.9 12.2  HCT 38.4 36.7  PLT 313 292   BMET  Recent Labs  07/28/12 07/29/12 0520  NA 142 133*  K 2.8* 3.3*  CL 106 99  CO2 22 23  GLUCOSE 116* 165*  BUN 9 11  CREATININE 1.10 1.43*  CALCIUM 10.4 9.1   PT/INR No results found for this basename: LABPROT, INR,  in the last 72 hours ABG No results found for this basename: PHART, PCO2, PO2, HCO3,  in the last 72 hours  Studies/Results: No results found.  Anti-infectives: Anti-infectives   Start     Dose/Rate Route Frequency Ordered Stop   07/28/12 1600  ciprofloxacin (CIPRO) IVPB 400 mg     400 mg 200 mL/hr over 60 Minutes Intravenous Every 12 hours 07/28/12 1513        Assessment/Plan: s/p Procedure(s) with comments: ENDOSCOPIC RETROGRADE CHOLANGIOPANCREATOGRAPHY (ERCP) (N/A) - Dr. Hung said he would start this PT arond 1330  HTN - blood pressure a little low this am - will hold norvasc. Holding diuretic as well given hypokalemia and bump in Cr SLE Elevated LFTs - a little better today but still concerned for probable retained  CBD stone. For ERCP later today Increased Cr - probable hypovolemia - cont IVF. Repeat Cr in am Elevated  WBC - cont Cipro for now  Zaire Levesque M. Angalena Cousineau, MD, FACS General, Bariatric, & Minimally Invasive Surgery [facility] [place]   LOS: 1 day    Mayling Aber M 07/29/2012  

## 2012-07-29 NOTE — Op Note (Signed)
[facility] [address] [place], [address]   ERCP PROCEDURE REPORT  PATIENT: Veronica [name], Veronica A.  MR# :[id number] BIRTHDATE: [birth year]  GENDER: Female ENDOSCOPIST: Jeani Hawking, MD REFERRED BY: Gaynelle Adu, M.D. PROCEDURE DATE:  07/29/2012 PROCEDURE:   ERCP with removal of calculus/calculi ASA CLASS:   Class II INDICATIONS:suspected or rule out bile duct stones. MEDICATIONS: Versed 7 mg IV, Fentanyl 75 mcg IV, and Benadryl 25 mg IV TOPICAL ANESTHETIC: Cetacaine Spray  DESCRIPTION OF PROCEDURE:   After the risks benefits and alternatives of the procedure were thoroughly explained, informed consent was obtained.  The     endoscope was introduced through the mouth  and advanced to the second portion of the duodenum .  The ampulla was spontaneously draining bile.  The guidewire was breifly advanced into the PD during two attempts.  Subsequently the CBD was cannulated and the guidewire was secured in the right intrahepatic ducts.  Contrast injection revealed a moderaately dilated CBD at one centimeter and a distal CBD filling defect was identified.  A 1 cm sphincterotomy was created and the moderately sized stone was extracted with the first pass of the extraction balloon.  A subsequent sweep of the CBD was negative for any other stones and a final occlussion cholangiogram was performed.  There was no evidence of any retained stones or bile duct leak.     The scope was then completely withdrawn from the patient and the procedure terminated.     COMPLICATIONS: .  There were no complications.  ENDOSCOPIC IMPRESSION: 1) Moderately-sized distal CBD stone s/p successfly extraction.  RECOMMENDATIONS: 1) Clear liquid diet. 2) Routine post-surgical care. 3) Continue with Cipro.     _______________________________ eSignedJeani Hawking, MD 07/29/2012 1:43 PM   XB:JYNWGN, Minerva Areola MD

## 2012-07-29 NOTE — Care Management Note (Signed)
    Page 1 of 1   07/29/2012     11:56:33 AM   CARE MANAGEMENT NOTE 07/29/2012  Patient:  Veronica [name], Veronica [name]   Account Number:  [id number]  Date Initiated:  07/29/2012  Documentation initiated by:  Lorenda Ishihara  Subjective/Objective Assessment:   52 yo female admitted with abd pain, elevated LFT's. PTA lived at home with spouse.     Action/Plan:   Home when stable   Anticipated DC Date:  08/01/2012   Anticipated DC Plan:  HOME/SELF CARE      DC Planning Services  CM consult      Choice offered to / List presented to:             Status of service:  Completed, signed off Medicare Important Message given?   (If response is "NO", the following Medicare IM given date fields will be blank) Date Medicare IM given:   Date Additional Medicare IM given:    Discharge Disposition:  HOME/SELF CARE  Per UR Regulation:  Reviewed for med. necessity/level of care/duration of stay  If discussed at Long Length of Stay Meetings, dates discussed:    Comments:

## 2012-07-29 NOTE — Progress Notes (Signed)
  Subjective: No major issues. Still N. No emesis. Still with mild RUQ pain.  WBC and Cr bumped overnight.   Objective: Vital signs in last 24 hours: Temp:  [99.2 F (37.3 C)-100.8 F (38.2 C)] 100.1 F (37.8 C) (04/01 0525) Pulse Rate:  [71-82] 75 (04/01 0525) Resp:  [14-20] 18 (04/01 0525) BP: (91-130)/(59-87) 91/59 mmHg (04/01 0525) SpO2:  [94 %-100 %] 94 % (04/01 0525) Weight:  [204 lb (92.534 kg)] 204 lb (92.534 kg) (03/31 1450) Last BM Date: 07/28/12  Intake/Output from previous day: 03/31 0701 - 04/01 0700 In: 2094.7 [P.O.:360; I.V.:1334.7; IV Piggyback:400] Out: 200 [Urine:200] Intake/Output this shift:    Asleep, easily arousable, NAD Soft, nd, obese. Very mild RUQ TTP No edema  Lab Results:   Recent Labs  07/28/12 1528 07/29/12 0520  WBC 7.7 18.7*  HGB 12.9 12.2  HCT 38.4 36.7  PLT 313 292   BMET  Recent Labs  07/28/12 07/29/12 0520  NA 142 133*  K 2.8* 3.3*  CL 106 99  CO2 22 23  GLUCOSE 116* 165*  BUN 9 11  CREATININE 1.10 1.43*  CALCIUM 10.4 9.1   PT/INR No results found for this basename: LABPROT, INR,  in the last 72 hours ABG No results found for this basename: PHART, PCO2, PO2, HCO3,  in the last 72 hours  Studies/Results: No results found.  Anti-infectives: Anti-infectives   Start     Dose/Rate Route Frequency Ordered Stop   07/28/12 1600  ciprofloxacin (CIPRO) IVPB 400 mg     400 mg 200 mL/hr over 60 Minutes Intravenous Every 12 hours 07/28/12 1513        Assessment/Plan: s/p Procedure(s) with comments: ENDOSCOPIC RETROGRADE CHOLANGIOPANCREATOGRAPHY (ERCP) (N/A) - Dr. Elnoria Howard said he would start this PT arond 1330  HTN - blood pressure a little low this am - will hold norvasc. Holding diuretic as well given hypokalemia and bump in Cr SLE Elevated LFTs - a little better today but still concerned for probable retained  CBD stone. For ERCP later today Increased Cr - probable hypovolemia - cont IVF. Repeat Cr in am Elevated  WBC - cont Cipro for now  Mary Sella. Andrey Campanile, MD, FACS General, Bariatric, & Minimally Invasive Surgery [facility], [place]   LOS: 1 day    Veronica [name] 07/29/2012

## 2012-07-29 NOTE — Progress Notes (Signed)
INITIAL NUTRITION ASSESSMENT  Pt meets criteria for severe MALNUTRITION in the context of chronic illness as evidenced by <75% estimated energy intake in the past month with 12.8% weight loss in the past 6 months per pt report.  DOCUMENTATION CODES Per approved criteria  -Severe malnutrition in the context of chronic illness -Obesity Unspecified   INTERVENTION: - Diet advancement per MD - Multivitamin 1 tablet PO daily - Anti-emetics per MD - Will continue to monitor   NUTRITION DIAGNOSIS: Inadequate oral intake related to clear liquid diet as evidenced by diet order.   Goal: Advance diet as tolerated to low fat diet.  Monitor:  Weights, labs, diet advancement, nausea/vomiting   Reason for Assessment: Nutrition risk   52 y.o. female  Admitting Dx: Nausea, vomiting, abdominal pain  ASSESSMENT: Pt with history of laparoscopic cholecystectomy with interoperative cholangiogram for chronic cholecystitis on March 21. Pt admitted with ongoing nausea, vomiting, and right upper quadrant abdominal pain. Pt admitted with worsening elevated liver function tests.   Met with pt who reports losing 30 pounds unintentionally in the past 6 months. Pt reports she has had no appetite and was only eating liquids for the past 2 weeks such as chicken noodle soup. Pt reports before then she was eating smaller portions of meals. Pt denies any vomiting or nausea today. Pt had EGD today which noted extraction of moderately-sized distal CBD stone.    Height: Ht Readings from Last 1 Encounters:  07/28/12 5\' 4"  (1.626 m)    Weight: Wt Readings from Last 1 Encounters:  07/28/12 204 lb (92.534 kg)    Ideal Body Weight: 120 lb  % Ideal Body Weight: 170  Wt Readings from Last 10 Encounters:  07/28/12 204 lb (92.534 kg)  07/28/12 204 lb (92.534 kg)  07/23/12 202 lb 2.6 oz (91.7 kg)  07/16/12 204 lb 12.9 oz (92.9 kg)  07/11/12 204 lb (92.534 kg)  04/10/12 219 lb 12.8 oz (99.701 kg)  04/03/11 234  lb (106.142 kg)    Usual Body Weight: 234 lb   % Usual Body Weight: 87  BMI:  Body mass index is 35 kg/(m^2). Class II obesity  Estimated Nutritional Needs: Kcal: 1550-1850 Protein: 55-65g Fluid: 1.5-1.8L/day  Skin: Abdominal incision   Diet Order: Clear Liquid  EDUCATION NEEDS: -No education needs identified at this time   Intake/Output Summary (Last 24 hours) at 07/29/12 1531 Last data filed at 07/29/12 0952  Gross per 24 hour  Intake 2094.67 ml  Output    401 ml  Net 1693.67 ml    Last BM: 3/31  Labs:   Recent Labs Lab 07/24/12 0525 07/28/12 07/29/12 0520  NA 139 142 133*  K 3.4* 2.8* 3.3*  CL 105 106 99  CO2 26 22 23   BUN 8 9 11   CREATININE 0.90 1.10 1.43*  CALCIUM 8.8 10.4 9.1  MG  --   --  1.5  GLUCOSE 105* 116* 165*    CBG (last 3)  No results found for this basename: GLUCAP,  in the last 72 hours  Scheduled Meds: . amLODipine  10 mg Oral Daily  . ciprofloxacin  400 mg Intravenous Q12H  . pantoprazole (PROTONIX) IV  40 mg Intravenous Daily  . topiramate  50 mg Oral BID  . verapamil  180 mg Oral QHS    Continuous Infusions: . dextrose 5 % and 0.45 % NaCl with KCl 20 mEq/L 150 mL/hr at 07/29/12 1610    Past Medical History  Diagnosis Date  . Hypertension   .  SLE (systemic lupus erythematosus) 1998  . Headache   . Hx of dizziness   . Arthritis   . History of blood transfusion     Past Surgical History  Procedure Laterality Date  . Abdominal hysterectomy  09/2008  . Cesarean section  1992, 1995  . Cholecystectomy N/A 07/18/2012    Procedure: LAPAROSCOPIC CHOLECYSTECTOMY WITH INTRAOPERATIVE CHOLANGIOGRAM;  Surgeon: Atilano Ina, MD;  Location: [facility] OR;  Service: General;  Laterality: N/A;     Levon Hedger MS, RD, LDN [phone] Pager [phone] After Hours Pager

## 2012-07-30 ENCOUNTER — Telehealth (INDEPENDENT_AMBULATORY_CARE_PROVIDER_SITE_OTHER): Payer: Self-pay

## 2012-07-30 ENCOUNTER — Encounter (HOSPITAL_COMMUNITY): Payer: Self-pay | Admitting: Gastroenterology

## 2012-07-30 DIAGNOSIS — R7989 Other specified abnormal findings of blood chemistry: Secondary | ICD-10-CM | POA: Diagnosis present

## 2012-07-30 DIAGNOSIS — K831 Obstruction of bile duct: Secondary | ICD-10-CM | POA: Diagnosis present

## 2012-07-30 DIAGNOSIS — E876 Hypokalemia: Secondary | ICD-10-CM | POA: Diagnosis present

## 2012-07-30 DIAGNOSIS — R112 Nausea with vomiting, unspecified: Secondary | ICD-10-CM | POA: Diagnosis present

## 2012-07-30 LAB — COMPREHENSIVE METABOLIC PANEL WITH GFR
ALT: 432 U/L — ABNORMAL HIGH (ref 0–35)
AST: 127 U/L — ABNORMAL HIGH (ref 0–37)
Albumin: 2.7 g/dL — ABNORMAL LOW (ref 3.5–5.2)
Alkaline Phosphatase: 157 U/L — ABNORMAL HIGH (ref 39–117)
BUN: 11 mg/dL (ref 6–23)
CO2: 21 meq/L (ref 19–32)
Calcium: 8.7 mg/dL (ref 8.4–10.5)
Chloride: 102 meq/L (ref 96–112)
Creatinine, Ser: 1.28 mg/dL — ABNORMAL HIGH (ref 0.50–1.10)
GFR calc Af Amer: 56 mL/min — ABNORMAL LOW
GFR calc non Af Amer: 48 mL/min — ABNORMAL LOW
Glucose, Bld: 151 mg/dL — ABNORMAL HIGH (ref 70–99)
Potassium: 3.6 meq/L (ref 3.5–5.1)
Sodium: 133 meq/L — ABNORMAL LOW (ref 135–145)
Total Bilirubin: 1.7 mg/dL — ABNORMAL HIGH (ref 0.3–1.2)
Total Protein: 6.3 g/dL (ref 6.0–8.3)

## 2012-07-30 LAB — CBC WITH DIFFERENTIAL/PLATELET
Basophils Absolute: 0 K/uL (ref 0.0–0.1)
Basophils Relative: 0 % (ref 0–1)
Eosinophils Absolute: 0.4 K/uL (ref 0.0–0.7)
Eosinophils Relative: 3 % (ref 0–5)
HCT: 34.3 % — ABNORMAL LOW (ref 36.0–46.0)
Hemoglobin: 11.3 g/dL — ABNORMAL LOW (ref 12.0–15.0)
Lymphocytes Relative: 9 % — ABNORMAL LOW (ref 12–46)
Lymphs Abs: 1.1 K/uL (ref 0.7–4.0)
MCH: 28.1 pg (ref 26.0–34.0)
MCHC: 32.9 g/dL (ref 30.0–36.0)
MCV: 85.3 fL (ref 78.0–100.0)
Monocytes Absolute: 0.3 K/uL (ref 0.1–1.0)
Monocytes Relative: 2 % — ABNORMAL LOW (ref 3–12)
Neutro Abs: 10.9 K/uL — ABNORMAL HIGH (ref 1.7–7.7)
Neutrophils Relative %: 86 % — ABNORMAL HIGH (ref 43–77)
Platelets: 250 K/uL (ref 150–400)
RBC: 4.02 MIL/uL (ref 3.87–5.11)
RDW: 14.2 % (ref 11.5–15.5)
WBC: 12.7 K/uL — ABNORMAL HIGH (ref 4.0–10.5)

## 2012-07-30 MED ORDER — CIPROFLOXACIN HCL 500 MG PO TABS: 500.0000 mg | ORAL_TABLET | Freq: Two times a day (BID) | ORAL | Status: DC

## 2012-07-30 MED ORDER — ACETAMINOPHEN 325 MG PO TABS: 650.0000 mg | ORAL_TABLET | Freq: Four times a day (QID) | ORAL | Status: DC | PRN

## 2012-07-30 MED ORDER — POLYETHYLENE GLYCOL 3350 17 G PO PACK
17.0000 g | PACK | Freq: Once | ORAL | Status: AC
  Administered 2012-07-30: 17 g via ORAL
  Filled 2012-07-30: qty 1

## 2012-07-30 MED FILL — Glucagon HCl (rDNA) For Inj 1 MG (Base Equiv): INTRAMUSCULAR | Qty: 2 | Status: AC

## 2012-07-30 NOTE — Progress Notes (Signed)
1 Day Post-Op  Subjective: CBD stone extracted yesterday. No post-procedure n/v/abd pain. Tolerated liquids. C/o HA and some edema  Objective: Vital signs in last 24 hours: Temp:  [98.8 F (37.1 C)-99.6 F (37.6 C)] 99.2 F (37.3 C) (04/02 0622) Pulse Rate:  [74-103] 83 (04/02 0622) Resp:  [14-29] 20 (04/02 0622) BP: (86-119)/(53-79) 105/70 mmHg (04/02 0622) SpO2:  [96 %-100 %] 99 % (04/02 0622) Last BM Date: 07/28/12  Intake/Output from previous day: 04/01 0701 - 04/02 0700 In: 5640 [P.O.:840; I.V.:4200; IV Piggyback:600] Out: 2201 [Urine:2200; Stool:1] Intake/Output this shift:    Alert, nad cta Reg Soft, obese, nt, nd No signif edema  Lab Results:   Recent Labs  07/29/12 0520 07/30/12 0425  WBC 18.7* 12.7*  HGB 12.2 11.3*  HCT 36.7 34.3*  PLT 292 250   BMET  Recent Labs  07/29/12 0520 07/30/12 0425  NA 133* 133*  K 3.3* 3.6  CL 99 102  CO2 23 21  GLUCOSE 165* 151*  BUN 11 11  CREATININE 1.43* 1.28*  CALCIUM 9.1 8.7   PT/INR No results found for this basename: LABPROT, INR,  in the last 72 hours ABG No results found for this basename: PHART, PCO2, PO2, HCO3,  in the last 72 hours  Studies/Results: Dg Ercp With Sphincterotomy  07/29/2012  *RADIOLOGY REPORT*  Clinical Data: Choledocholithiasis and prior cholecystectomy.  ERCP  Comparison:  None.  Technique:  Multiple spot images obtained with the fluoroscopic device and submitted for interpretation post-procedure.  ERCP was performed by Dr. Elnoria Howard.  Findings: Sedated imaging during the ERCP procedure demonstrates opacification of the common bile duct with suggestion of a distal CBD filling defect.  Sphincterotomy and balloon extraction were performed.  IMPRESSION: ERCP confirms distal CBD filling defect consistent with choledocholithiasis.  Balloon extraction of the stone was performed.  These images were submitted for radiologic interpretation only. Please see the procedural report for the amount of  contrast and the fluoroscopy time utilized.   Original Report Authenticated By: Irish Lack, M.D.     Anti-infectives: Anti-infectives   Start     Dose/Rate Route Frequency Ordered Stop   07/28/12 1600  ciprofloxacin (CIPRO) IVPB 400 mg     400 mg 200 mL/hr over 60 Minutes Intravenous Every 12 hours 07/28/12 1513        Assessment/Plan: s/p Procedure(s) with comments: ENDOSCOPIC RETROGRADE CHOLANGIOPANCREATOGRAPHY (ERCP) (N/A) - Dr. Elnoria Howard said he would start this PT arond 1330( AW)  Retained CBD stone, elevated LFTs - s/p ERCP/stone extraction, LFTs normalizing, adv diet AKI - elevated Cr improving Hypokalemia - resolved  Adv diet Tylenol for HA If tolerates diet, d/c home late this am D/w d/c instructions  Mary Sella. Andrey Campanile, MD, FACS General, Bariatric, & Minimally Invasive Surgery [facility], [place]   LOS: 2 days    Atilano Ina 07/30/2012

## 2012-07-30 NOTE — Discharge Summary (Addendum)
Physician Discharge Summary  Veronica [name] WUJ:[id number] DOB: Sep 19, 1960 DOA: 07/28/2012  PCP: [name],[name], DO  Admit date: 07/28/2012 Discharge date: 07/30/2012  Recommendations for Outpatient Follow-up:  1.   Follow-up Information   Follow up with Atilano Ina, MD. Schedule an appointment as soon as possible for a visit in 3 weeks. (will cancel your appt on 4/7)    Contact information:   [address] [place] [address] [phone]       Follow up with [name],[name], DO. Schedule an appointment as soon as possible for a visit in 1 week. (to follow up on your blood pressure - keep appt you have wiith your PCP for 08/01/12)    Contact information:   [address] [place] [address] [phone]      Discharge Diagnoses:  Patient Active Problem List  Diagnosis  . Essential hypertension, benign  . SLE (systemic lupus erythematosus)  . Symptomatic cholelithiasis  . Elevated LFTs  . Hypokalemia  . Common bile duct (CBD) obstruction  . Nausea with vomiting    Surgical Procedure: ERCP 07/29/12  Discharge Condition: good Disposition: to home   Diet recommendation: Parke Simmers diet  Filed Weights   07/28/12 1450  Weight: 204 lb (92.534 kg)    Hospital Course:  52 year old African American female was admitted on March 31 for persistent nausea, vomiting, and right upper quadrant pain. She had underwent a laparoscopic cholecystectomy with cholangiogram on March 21. Postoperatively she was readmitted for 2 days for nausea, vomiting, and right upper quadrant pain. She had a CT scan as well as a nuclear medicine scan which did not reveal any evidence of a bile leak. However she still continued to be symptomatic and was readmitted. Her LFTs were elevated and I was concern for common bile duct obstruction due to a retained gallstone. She was admitted and placed on ciprofloxacin and GI was consulted. She was also hypokalemic and her potassium was replaced.  Several of her blood pressure medicines were held because of a low normal blood pressure. She underwent an ERCP on April 1 which demonstrated a distal retained common bile duct stone which was successfully extracted. On post procedure day 1 she was tolerating a diet without any nausea, vomiting, or abdominal pain. She was afebrile. Her white blood cell count was trending down. Her systolic blood pressure was still around 100 so I decided not to send her home on all her blood pressure medications. She has a followup appointment with her primary care physician in the next couple days and I'll leave it to her PCP to address her blood pressure medication. I plan on sending her out with 2 additional days of oral antibiotics To cover her biliary tract   Discharge Instructions      Discharge Orders   Future Appointments Provider Department Dept Phone   08/01/2012 9:00 AM Barnabas Lister, [place] [facility] [phone]   08/04/2012 1:45 PM Atilano Ina, MD [facility], [place] [phone]   Future Orders Complete By Expires     Diet - low sodium heart healthy  As directed     Comments:      Eat a bland diet (low grease) diet for 1 week    Increase activity slowly  As directed         Medication List    STOP taking these medications       verapamil 180 MG 24 hr capsule  Commonly  known as:  VERELAN PM      TAKE these medications       amLODipine 10 MG tablet  Commonly known as:  NORVASC  Take 1 tablet (10 mg total) by mouth daily.     ciprofloxacin 500 MG tablet  Commonly known as:  CIPRO  Take 1 tablet (500 mg total) by mouth 2 (two) times daily.     lisinopril-hydrochlorothiazide 20-12.5 MG per tablet  Commonly known as:  PRINZIDE,ZESTORETIC  Take 1 tablet by mouth 2 (two) times daily.     multivitamin with minerals Tabs  Take 1 tablet by mouth daily.     potassium chloride 10 MEQ tablet  Commonly known as:  K-DUR  Take 2 tablets (20 mEq total) by mouth 2  (two) times daily.     topiramate 50 MG tablet  Commonly known as:  TOPAMAX  Take 1 tablet (50 mg total) by mouth 2 (two) times daily.       Follow-up Information   Follow up with Atilano Ina, MD. Schedule an appointment as soon as possible for a visit in 3 weeks. (will cancel your appt on 4/7)    Contact information:   [address] [place] [address] [phone]       Follow up with [name],[name], DO. Schedule an appointment as soon as possible for a visit in 1 week. (to follow up on your blood pressure - keep appt you have wiith your PCP for 08/01/12)    Contact information:   [address] [place] [address] [phone]        The results of significant diagnostics from this hospitalization (including imaging, microbiology, ancillary and laboratory) are listed below for reference.    Significant Diagnostic Studies: Dg Chest 2 View  07/16/2012  *RADIOLOGY REPORT*  Clinical Data: Preoperative respiratory exam.  Acute cholecystitis.  CHEST - 2 VIEW  Comparison: Chest x-ray dated 12/12/2010  Findings: Slight chronic cardiomegaly, stable.  Pulmonary vascularity is normal.  Lungs are clear.  No acute osseous abnormality.  IMPRESSION: No acute disease.  Mild chronic cardiomegaly.   Original Report Authenticated By: Francene Boyers, M.D.    Dg Cholangiogram Operative  07/18/2012  *RADIOLOGY REPORT*  Intraoperative cholangiogram  History:  Cholecystitis  Findings:  Gallbladder is been removed, the cystic duct has been cannulated.  There is extensive contrast extravasation.  Most of the intrahepatic biliary ductal system is not visualized.  Common hepatic and common bile duct, however, or visualized appear normal. There is no mass or calculus in the extrahepatic biliary ductal system.  There is apparent free flow of contrast via the common bile duct into the duodenum.  Conclusion:  No extrahepatic biliary duct obstruction.  Limited visualization of the  intrahepatic ducts.  Extensive contrast extravasation.   Original Report Authenticated By: Bretta Bang, M.D.    Nm Hepatobiliary  07/24/2012  *RADIOLOGY REPORT*  Clinical Data:  Post cholecystectomy.  Abdominal pain.  NUCLEAR MEDICINE HEPATOBILIARY IMAGING  Technique:  Sequential images of the abdomen were obtained out to 60 minutes following intravenous administration of radiopharmaceutical.  Radiopharmaceutical:  5.78mCi Tc-47m Choletec  Comparison:  None.  Findings: There is prompt uptake of radiotracer within the liver parenchyma and biliary system.  There is absence of the gallbladder compatible with post cholecystectomy state.  Small bowel activity occurs after 15 minutes.  Radiotracer is visualized throughout the expected course of small bowel and biliary tree.  There is some radiotracer to the left of the  left lobe of the liver likely related to reflux into the stomach.  No evidence of extrahepatic radiotracer uptake to suggest bile leak.  IMPRESSION: No evidence of bile leak.   Original Report Authenticated By: Jolaine Click, M.D.    Ct Abdomen Pelvis W Contrast  07/23/2012  *RADIOLOGY REPORT*  Clinical Data: Right upper quadrant pain.  Nausea and vomiting. Recent gallbladder surgery.  CT ABDOMEN AND PELVIS WITH CONTRAST  Technique:  Multidetector CT imaging of the abdomen and pelvis was performed following the standard protocol during bolus administration of intravenous contrast.  Contrast: OMNIPAQUE IOHEXOL 300 MG/ML  SOLN  Comparison: Ultrasound 06/03/2012.  Findings: Lung Bases: Marked dependent atelectasis, greater on the right than left.  No airspace disease.  The heart grossly appears normal allowing for lung volumes.  Liver:  Tiny amount of intrahepatic biliary ductal dilation, commonly seen after cholecystectomy.  No mass lesions.  Spleen:  Normal.  Gallbladder:  Surgically absent.  Mild stranding is present in the gallbladder fossa compatible with recent cholecystectomy.  No fluid  collections.  Common bile duct:  Normal.  No calcified common duct stone identified.  Pancreas:  Normal.  Adrenal glands:  Normal.  Kidneys:  Normal enhancement and delayed excretion of contrast.  In the right inferior renal pole, there is a 13 mm nonenhancing renal cysts.  Both ureters appear within normal limits.  Stomach:  Distended with oral contrast.  Small bowel:  Normal duodenum.  Small bowel appears within normal limits.  No mesenteric adenopathy.  No fluid collections.  No inflammatory changes.  Colon:   Normal appendix.  Moderate stool burden in the proximal colon.  Colonic diverticulosis is present distally.  Pelvic Genitourinary:  Small amount of free fluid is present in the anatomic pelvis, likely physiologic.  Uterus is surgically absent. Urinary bladder appears normal.  No adenopathy.  Large fluid collections are present in the labia compatible with Bartholin gland cysts.  Bones:  No aggressive osseous lesions.  Vasculature: Normal.  Stranding is present in the periumbilical region compatible with prior laparoscopic port.  Notably, and no pneumoperitoneum following cholecystectomy.  IMPRESSION:  1.  Postoperative findings compatible with recent cholecystectomy. No fluid collection/abscess.  Mild intrahepatic biliary ductal dilation.  Correlation with bilirubin recommended.  This is commonly seen after cholecystectomy however common duct injury cannot be excluded. 2.  Straightening of the laparoscopic portal site in the expected following cholecystectomy. 3.  Small amount of free fluid in the anatomic pelvis may be postoperative or physiologic. 4.  13 mm right inferior pole renal cyst.   Original Report Authenticated By: Andreas Newport, M.D.    Dg Ercp With Sphincterotomy  07/29/2012  *RADIOLOGY REPORT*  Clinical Data: Choledocholithiasis and prior cholecystectomy.  ERCP  Comparison:  None.  Technique:  Multiple spot images obtained with the fluoroscopic device and submitted for interpretation  post-procedure.  ERCP was performed by Dr. Elnoria Howard.  Findings: Sedated imaging during the ERCP procedure demonstrates opacification of the common bile duct with suggestion of a distal CBD filling defect.  Sphincterotomy and balloon extraction were performed.  IMPRESSION: ERCP confirms distal CBD filling defect consistent with choledocholithiasis.  Balloon extraction of the stone was performed.  These images were submitted for radiologic interpretation only. Please see the procedural report for the amount of contrast and the fluoroscopy time utilized.   Original Report Authenticated By: Irish Lack, M.D.     Microbiology: No results found for this or any previous visit (from the past 240 hour(s)).   Labs: Basic  Metabolic Panel:  Recent Labs Lab 07/23/12 1727 07/24/12 0525 07/28/12 07/29/12 0520 07/30/12 0425  NA 140 139 142 133* 133*  K 3.4* 3.4* 2.8* 3.3* 3.6  CL 105 105 106 99 102  CO2 26 26 22 23 21   GLUCOSE 118* 105* 116* 165* 151*  BUN 10 8 9 11 11   CREATININE 0.80 0.90 1.10 1.43* 1.28*  CALCIUM 9.2 8.8 10.4 9.1 8.7  MG  --   --   --  1.5  --    Liver Function Tests:  Recent Labs Lab 07/24/12 0525 07/28/12 07/28/12 1200 07/29/12 0520 07/30/12 0425  AST 1011* 538* 521* 363* 127*  ALT 620* 897* 766* 640* 432*  ALKPHOS 219* 253* 216* 199* 157*  BILITOT 1.3* 6.0* 5.4* 4.4* 1.7*  PROT 7.3 8.3 8.2 6.9 6.3  ALBUMIN 3.2* 4.1 4.2 3.0* 2.7*    Recent Labs Lab 07/23/12 1727 07/28/12 1528  LIPASE 19 40   No results found for this basename: AMMONIA,  in the last 168 hours CBC:  Recent Labs Lab 07/23/12 0934 07/23/12 1704 07/24/12 0525 07/28/12 1528 07/29/12 0520 07/30/12 0425  WBC 9.6 10.9* 5.0 7.7 18.7* 12.7*  NEUTROABS 8.4* 8.8*  --  5.3  --  10.9*  HGB 14.3 13.0 11.7* 12.9 12.2 11.3*  HCT 45.9 37.6 35.9* 38.4 36.7 34.3*  MCV 87.2 82.8 86.3 85.9 85.3 85.3  PLT 242 277 268 313 292 250    Principal Problem:   Common bile duct (CBD) obstruction Active  Problems:   Essential hypertension, benign   Elevated LFTs   Hypokalemia   Nausea with vomiting   Time coordinating discharge: 10 min  Signed:  Atilano Ina, MD [facility], [place] [phone] 07/30/2012, 8:19 AM

## 2012-07-30 NOTE — Progress Notes (Signed)
Pt feeling better and wants to go home.  Ate grilled fish for lunch. She is ready for d/c so I will order d/c.

## 2012-07-30 NOTE — Telephone Encounter (Signed)
LM w/pt's husband to notify them that I r/s the appt from 4/7 to 5/16 arrive at 10:00 with Dr Andrey Campanile.

## 2012-07-30 NOTE — Progress Notes (Signed)
Spoke to [employer], [place] informed him patient had episode of nausea after breakfast, but none after lunch and wants to go home, states ok to discharge patient. Iv removed, and patient states understanding of discharge instructions. Rx for cipro given.

## 2012-07-30 NOTE — Progress Notes (Signed)
Subjective: Overall she is well.  Slight amount of pain at this time, but nothing compared to her admission pain.  Objective: Vital signs in last 24 hours: Temp:  [98.8 F (37.1 C)-99.6 F (37.6 C)] 99.2 F (37.3 C) (04/02 0622) Pulse Rate:  [74-103] 83 (04/02 0622) Resp:  [14-29] 20 (04/02 0622) BP: (86-119)/(53-79) 105/70 mmHg (04/02 0622) SpO2:  [96 %-100 %] 99 % (04/02 0622) Last BM Date: 07/28/12  Intake/Output from previous day: 04/01 0701 - 04/02 0700 In: 5640 [P.O.:840; I.V.:4200; IV Piggyback:600] Out: 2201 [Urine:2200; Stool:1] Intake/Output this shift:    General appearance: alert and no distress GI: soft, non-tender; bowel sounds normal; no masses,  no organomegaly  Lab Results:  Recent Labs  07/28/12 1528 07/29/12 0520 07/30/12 0425  WBC 7.7 18.7* 12.7*  HGB 12.9 12.2 11.3*  HCT 38.4 36.7 34.3*  PLT 313 292 250   BMET  Recent Labs  07/28/12 07/29/12 0520 07/30/12 0425  NA 142 133* 133*  K 2.8* 3.3* 3.6  CL 106 99 102  CO2 22 23 21   GLUCOSE 116* 165* 151*  BUN 9 11 11   CREATININE 1.10 1.43* 1.28*  CALCIUM 10.4 9.1 8.7   LFT  Recent Labs  07/28/12 1200  07/30/12 0425  PROT 8.2  < > 6.3  ALBUMIN 4.2  < > 2.7*  AST 521*  < > 127*  ALT 766*  < > 432*  ALKPHOS 216*  < > 157*  BILITOT 5.4*  < > 1.7*  BILIDIR 3.7*  --   --   IBILI 1.7*  --   --   < > = values in this interval not displayed. PT/INR No results found for this basename: LABPROT, INR,  in the last 72 hours Hepatitis Panel No results found for this basename: HEPBSAG, HCVAB, HEPAIGM, HEPBIGM,  in the last 72 hours C-Diff No results found for this basename: CDIFFTOX,  in the last 72 hours Fecal Lactopherrin No results found for this basename: FECLLACTOFRN,  in the last 72 hours  Studies/Results: Dg Ercp With Sphincterotomy  07/29/2012  *RADIOLOGY REPORT*  Clinical Data: Choledocholithiasis and prior cholecystectomy.  ERCP  Comparison:  None.  Technique:  Multiple spot images  obtained with the fluoroscopic device and submitted for interpretation post-procedure.  ERCP was performed by Dr. Elnoria Howard.  Findings: Sedated imaging during the ERCP procedure demonstrates opacification of the common bile duct with suggestion of a distal CBD filling defect.  Sphincterotomy and balloon extraction were performed.  IMPRESSION: ERCP confirms distal CBD filling defect consistent with choledocholithiasis.  Balloon extraction of the stone was performed.  These images were submitted for radiologic interpretation only. Please see the procedural report for the amount of contrast and the fluoroscopy time utilized.   Original Report Authenticated By: Irish Lack, M.D.     Medications:  Scheduled: . amLODipine  10 mg Oral Daily  . ciprofloxacin  400 mg Intravenous Q12H  . multivitamin with minerals  1 tablet Oral Daily  . pantoprazole (PROTONIX) IV  40 mg Intravenous Daily  . polyethylene glycol  17 g Oral Once  . topiramate  50 mg Oral BID  . verapamil  180 mg Oral QHS   Continuous: . dextrose 5 % and 0.45 % NaCl with KCl 20 mEq/L 150 mL/hr at 07/30/12 0630    Assessment/Plan: 1) Choledocholithiasis s/p ERCP. 2) S/p lap chole.   No pain with palpation of there abdomen and her liver enzymes improving.    Plan: 1) Disposition per Dr. Andrey Campanile. 2)  Signing off.   LOS: 2 days   Suda Forbess D 07/30/2012, 8:22 AM

## 2012-08-01 ENCOUNTER — Encounter: Payer: Self-pay | Admitting: Family Medicine

## 2012-08-01 ENCOUNTER — Ambulatory Visit (INDEPENDENT_AMBULATORY_CARE_PROVIDER_SITE_OTHER): Payer: No Typology Code available for payment source | Admitting: Family Medicine

## 2012-08-01 VITALS — BP 117/75 | HR 83 | Temp 98.9°F | Ht 64.0 in | Wt 204.1 lb

## 2012-08-01 DIAGNOSIS — E876 Hypokalemia: Secondary | ICD-10-CM

## 2012-08-01 DIAGNOSIS — M79609 Pain in unspecified limb: Secondary | ICD-10-CM | POA: Diagnosis not present

## 2012-08-01 DIAGNOSIS — I10 Essential (primary) hypertension: Secondary | ICD-10-CM | POA: Diagnosis not present

## 2012-08-01 DIAGNOSIS — M79601 Pain in right arm: Secondary | ICD-10-CM

## 2012-08-01 MED ORDER — TOPIRAMATE 50 MG PO TABS: 50.0000 mg | ORAL_TABLET | Freq: Two times a day (BID) | ORAL | Status: DC

## 2012-08-01 MED ORDER — LISINOPRIL-HYDROCHLOROTHIAZIDE 20-12.5 MG PO TABS: 1.0000 | ORAL_TABLET | Freq: Two times a day (BID) | ORAL | Status: DC

## 2012-08-01 MED ORDER — AMLODIPINE BESYLATE 10 MG PO TABS: 10.0000 mg | ORAL_TABLET | Freq: Every day | ORAL | Status: DC

## 2012-08-01 MED ORDER — POTASSIUM CHLORIDE ER 10 MEQ PO TBCR: 10.0000 meq | EXTENDED_RELEASE_TABLET | Freq: Every day | ORAL | Status: DC

## 2012-08-01 NOTE — Assessment & Plan Note (Signed)
Likely related to strain/sprain neck while being in hospital.  Will treat conservatively with Ibuprofen OTC as needed and heating pads to RT side of neck.  If no improvement in 2-4 weeks, patient to call or schedule follow up appointment.  No red flags on exam today.

## 2012-08-01 NOTE — Assessment & Plan Note (Signed)
Agree with D/C Verapamil.  Continue Lisinopril, HCTZ, and Norvasc.  BP at goal today.

## 2012-08-01 NOTE — Progress Notes (Signed)
Subjective:    Patient ID: Veronica [name], female    DOB: 1960/12/12, 52 y.o.   MRN: [id number]  HPI  Patient presents to clinic status post cholecystectomy with complications.  Now patient feels better and will follow up with Dr. Andrey Campanile.  She needs medication refills today.  She used to go [facility], but it has closed.    For high blood pressure, she takes Lisinopril, Norvasc.  She stopped taking Verapamil since she was discharged from hospital due to low BP after surgery.  BP stable today.   Denies any vomiting, CP, SOB, headache.  She does complain of numbness and pain of RT arm since hospital discharge.  She has not taken anything for pain.  Review of Systems Per HPI    Objective:   Physical Exam  Constitutional: She appears well-nourished. No distress.  Cardiovascular: Normal rate.   Pulmonary/Chest: Effort normal.  Neurological: She has normal strength. No cranial nerve deficit or sensory deficit.  MSK: full ROM of neck with mild pain with rotation to RT, shoulder and upper extremity exam was unremarkable; strength normal, no signs of impingement     Assessment & Plan:

## 2012-08-01 NOTE — Assessment & Plan Note (Signed)
Continue K-dur 10 daily.  Last K was 3.6 on 07/30/12.

## 2012-08-04 ENCOUNTER — Encounter (INDEPENDENT_AMBULATORY_CARE_PROVIDER_SITE_OTHER): Payer: PRIVATE HEALTH INSURANCE | Admitting: General Surgery

## 2012-08-08 ENCOUNTER — Telehealth (INDEPENDENT_AMBULATORY_CARE_PROVIDER_SITE_OTHER): Payer: Self-pay | Admitting: General Surgery

## 2012-08-08 ENCOUNTER — Encounter (INDEPENDENT_AMBULATORY_CARE_PROVIDER_SITE_OTHER): Payer: Self-pay | Admitting: General Surgery

## 2012-08-08 NOTE — Telephone Encounter (Signed)
Patient requesting a return to work note. Note written and at the front for patient pick up per her request.

## 2012-09-02 ENCOUNTER — Ambulatory Visit (INDEPENDENT_AMBULATORY_CARE_PROVIDER_SITE_OTHER): Payer: No Typology Code available for payment source | Admitting: Family Medicine

## 2012-09-02 ENCOUNTER — Encounter: Payer: Self-pay | Admitting: Family Medicine

## 2012-09-02 VITALS — BP 114/79 | HR 74 | Temp 98.3°F | Ht 64.0 in | Wt 208.8 lb

## 2012-09-02 DIAGNOSIS — R600 Localized edema: Secondary | ICD-10-CM

## 2012-09-02 DIAGNOSIS — Z23 Encounter for immunization: Secondary | ICD-10-CM

## 2012-09-02 DIAGNOSIS — R51 Headache: Secondary | ICD-10-CM | POA: Diagnosis not present

## 2012-09-02 DIAGNOSIS — R519 Headache, unspecified: Secondary | ICD-10-CM | POA: Insufficient documentation

## 2012-09-02 DIAGNOSIS — R609 Edema, unspecified: Secondary | ICD-10-CM | POA: Diagnosis not present

## 2012-09-02 DIAGNOSIS — Z111 Encounter for screening for respiratory tuberculosis: Secondary | ICD-10-CM | POA: Diagnosis not present

## 2012-09-02 MED ORDER — TETANUS-DIPHTH-ACELL PERTUSSIS 5-2.5-18.5 LF-MCG/0.5 IM SUSP: 0.5000 mL | Freq: Once | INTRAMUSCULAR | Status: DC

## 2012-09-02 NOTE — Assessment & Plan Note (Signed)
No evidence of migraine at this time.  Neuro exam normal.  Will D/C Topamax and see how patient does without it.  Recommended Aleve or Excedrin as needed for pain relief.  Follow up in 4 weeks or sooner as needed.  Red flags reviewed.

## 2012-09-02 NOTE — Progress Notes (Signed)
Subjective:    Patient ID: Veronica [name], female    DOB: 1960/12/26, 52 y.o.   MRN: [id number]  HPI  Bilateral feet swelling: She noticed symptoms when she went back to work March 14th.  She works for [employer] and is on her feet a lot.  Swelling worsens throughout the day.  She has not tried elevating them.  Denies any foot pain or skin changes.  She does develop numbness/tingling toes bilaterally when feet are swollen. Denies any chest pain or SOB.  BP well controlled.  Headaches:  Patient continues to have headaches.  Patient says Topamax costs $200 and wants a new medication.  She denies any hx of migraines, but was given Topamax from her previous physician.  Today, she has a mild HA about 4-5/10.  She does not take any OTC pain medications.  Located frontal lobe area.  Denies any migraine with aura, floaters, nausea/vomiting.  Denies any neck pain.  Review of Systems Per HPI    Objective:   Physical Exam  Constitutional: No distress.  HENT:  Head: Normocephalic and atraumatic.  Neck: Normal range of motion.  Musculoskeletal:  Trace pitting edema around ankles bilaterally.  No skin weeping.  Strong distal pulses.  Neurological: She is alert. No cranial nerve deficit. Coordination normal.       Assessment & Plan:

## 2012-09-02 NOTE — Patient Instructions (Addendum)
For headaches, you may take over the counter Excedrin or Aleve every 6 hours as needed for pain. If headache is not relieved by this or if pain worsens, please return to clinic.  For leg swelling, purchase over the counter compression hose and wear them during the day or while you work. At bedtime, elevate your legs over your heart every night. If this does not improve after 4 weeks, please return to clinic.

## 2012-09-02 NOTE — Assessment & Plan Note (Signed)
Trace edema likely from being overweight and working on her feet all day.  Will treat with compression hose during the day and elevating legs over heart at night.  She is on a small dose of HCTZ.  Could consider increasing it if conservative management does not work.

## 2012-09-05 ENCOUNTER — Encounter: Payer: Self-pay | Admitting: Family Medicine

## 2012-09-05 ENCOUNTER — Ambulatory Visit (INDEPENDENT_AMBULATORY_CARE_PROVIDER_SITE_OTHER): Payer: No Typology Code available for payment source | Admitting: *Deleted

## 2012-09-05 ENCOUNTER — Encounter: Payer: Self-pay | Admitting: *Deleted

## 2012-09-05 DIAGNOSIS — Z111 Encounter for screening for respiratory tuberculosis: Secondary | ICD-10-CM

## 2012-09-05 LAB — TB SKIN TEST
Induration: 0 mm
TB Skin Test: NEGATIVE

## 2012-09-05 NOTE — Progress Notes (Signed)
.  PPD Reading Note PPD read and results entered in EpicCare. Result: 0 mm induration. Interpretation: negative Letter given to pt with test results and also paper verifing date of last tdap test. Wyatt Haste, RN-BSN

## 2012-09-12 ENCOUNTER — Ambulatory Visit (INDEPENDENT_AMBULATORY_CARE_PROVIDER_SITE_OTHER): Payer: PRIVATE HEALTH INSURANCE | Admitting: General Surgery

## 2012-09-12 ENCOUNTER — Encounter (INDEPENDENT_AMBULATORY_CARE_PROVIDER_SITE_OTHER): Payer: Self-pay | Admitting: General Surgery

## 2012-09-12 VITALS — BP 124/80 | HR 84 | Resp 18 | Ht 64.0 in | Wt 206.0 lb

## 2012-09-12 DIAGNOSIS — R7989 Other specified abnormal findings of blood chemistry: Secondary | ICD-10-CM

## 2012-09-12 DIAGNOSIS — Z09 Encounter for follow-up examination after completed treatment for conditions other than malignant neoplasm: Secondary | ICD-10-CM

## 2012-09-12 NOTE — Patient Instructions (Signed)
The pain should get better. We will check labs just to make sure. If you develop Temp >101, persistent nausea or vomiting, worsening pain. We will call you with the lab results. If they are abnormal we will work it up further

## 2012-09-12 NOTE — Progress Notes (Signed)
Subjective:     Patient ID: Veronica [name], female   DOB: Sep 15, 1960, 52 y.o.   MRN: [id number]  HPI 52 year old [place] American female comes in today for her postoperative appointment. She underwent laparoscopic cholecystectomy with cholangiogram on March 21. She was discharged to home however she was readmitted 2 days later for nausea vomiting and persistent pain. She had elevated liver function tests. She underwent evaluation and workup. There is no evidence of a bile leak. However there was suggestion of a retained common bile duct stone. She underwent ERCPand sphincterotomy with extraction of common bile duct stone. She was discharged home the following day. She states that she has been doing well. She denies any fever, chills, nausea, vomiting, diarrhea or constipation. She reports a decreased appetite. However she states about 2-3 days ago she started to have recurrent abdominal pain mainly on the right side. She describes it as a dull ache. Yesterday she says it was pretty intense. However she was able to eat throughout the day.  Review of Systems     Objective:   Physical Exam BP 124/80  Pulse 84  Resp 18  Ht 5\' 4"  (1.626 m)  Wt 206 lb (93.441 kg)  BMI 35.34 kg/m2  Gen: alert, NAD, non-toxic appearing Pupils: equal, no scleral icterus Pulm: Lungs clear to auscultation, symmetric chest rise CV: regular rate and rhythm Abd: soft, nontender, nondistended. Well-healed trocar sites. No cellulitis. No incisional hernia Ext: no edema, no calf tenderness Skin: no rash, no jaundice     Assessment:     Status post laparoscopic cholecystectomy with cholangiogram Status post ERCP and sphincterotomy with stone extraction Persistent right-sided discomfort     Plan:     We reviewed her pathology report. It showed chronic cholecystitis and cholelithiasis. She is nontoxic appearing. Her abdominal exam is benign. Her vital signs are stable. My suspicion for anything serious is low.  However her LFTs were still elevated prior to her ERCP so therefore we will check a CBC and a comprehensive metabolic panel today. If her labs are still abnormal obviously this will prompt additional workup. If her labs are normal followup will be as needed  Mary Sella. Andrey Campanile, MD, FACS General, Bariatric, & Minimally Invasive Surgery [facility], [place]

## 2012-09-13 LAB — CBC WITH DIFFERENTIAL/PLATELET
Basophils Absolute: 0 10*3/uL (ref 0.0–0.1)
Basophils Relative: 0 % (ref 0–1)
Eosinophils Absolute: 0.2 10*3/uL (ref 0.0–0.7)
Eosinophils Relative: 3 % (ref 0–5)
HCT: 37.5 % (ref 36.0–46.0)
Hemoglobin: 12 g/dL (ref 12.0–15.0)
Lymphocytes Relative: 30 % (ref 12–46)
Lymphs Abs: 1.9 10*3/uL (ref 0.7–4.0)
MCH: 27.3 pg (ref 26.0–34.0)
MCHC: 32 g/dL (ref 30.0–36.0)
MCV: 85.4 fL (ref 78.0–100.0)
Monocytes Absolute: 0.6 10*3/uL (ref 0.1–1.0)
Monocytes Relative: 9 % (ref 3–12)
Neutro Abs: 3.6 10*3/uL (ref 1.7–7.7)
Neutrophils Relative %: 58 % (ref 43–77)
Platelets: 330 10*3/uL (ref 150–400)
RBC: 4.39 MIL/uL (ref 3.87–5.11)
RDW: 13.7 % (ref 11.5–15.5)
WBC: 6.3 10*3/uL (ref 4.0–10.5)

## 2012-09-13 LAB — COMPREHENSIVE METABOLIC PANEL
ALT: 9 U/L (ref 0–35)
AST: 13 U/L (ref 0–37)
Albumin: 4.1 g/dL (ref 3.5–5.2)
Alkaline Phosphatase: 48 U/L (ref 39–117)
BUN: 11 mg/dL (ref 6–23)
CO2: 29 mEq/L (ref 19–32)
Calcium: 9.5 mg/dL (ref 8.4–10.5)
Chloride: 102 mEq/L (ref 96–112)
Creat: 0.79 mg/dL (ref 0.50–1.10)
Glucose, Bld: 96 mg/dL (ref 70–99)
Potassium: 3.7 mEq/L (ref 3.5–5.3)
Sodium: 137 mEq/L (ref 135–145)
Total Bilirubin: 0.5 mg/dL (ref 0.3–1.2)
Total Protein: 7.6 g/dL (ref 6.0–8.3)

## 2012-09-15 ENCOUNTER — Telehealth (INDEPENDENT_AMBULATORY_CARE_PROVIDER_SITE_OTHER): Payer: Self-pay | Admitting: General Surgery

## 2012-09-15 ENCOUNTER — Encounter (INDEPENDENT_AMBULATORY_CARE_PROVIDER_SITE_OTHER): Payer: Self-pay

## 2012-09-15 ENCOUNTER — Other Ambulatory Visit (INDEPENDENT_AMBULATORY_CARE_PROVIDER_SITE_OTHER): Payer: Self-pay

## 2012-09-15 ENCOUNTER — Telehealth (INDEPENDENT_AMBULATORY_CARE_PROVIDER_SITE_OTHER): Payer: Self-pay

## 2012-09-15 DIAGNOSIS — R1011 Right upper quadrant pain: Secondary | ICD-10-CM

## 2012-09-15 DIAGNOSIS — Z9049 Acquired absence of other specified parts of digestive tract: Secondary | ICD-10-CM

## 2012-09-15 NOTE — Telephone Encounter (Signed)
Spoke with pt - persistent RUQ pain under rib, 5/10. No n/v/fc. difft than the pain she had preop. Explained labs were normal. Since this is persistent and not improving explained we would order CT abd-pelvis with IV/oral contrast. Explained my office would contact her with info. Pt prefers an afternoon CT. pls set pt up for Ct a/p with IV & oral contrast; reason - RUQ pain; history - s/p Lap chole and ERCP

## 2012-09-15 NOTE — Telephone Encounter (Signed)
Patient is aware of  Ct scan 09/18/12 at 915am she was instructed to pick up  Contrast at the [facility] office  No solids  4 hours prior

## 2012-09-15 NOTE — Telephone Encounter (Signed)
Patient calling in for lab results.  Patient advised that her lab work was Normal.  Patient states she continues to have abdominal pain.  Patient was advised that per last office visit on 09/12/2012, if her lab results were normal, then she would follow up as needed.  Patient wants to know what to do for her pain.  Patient denies having any fever, nausea or vomiting.  Please advise next step for patient.

## 2012-09-15 NOTE — Telephone Encounter (Signed)
Will call patient in am she was not at home she has appt on 09/18/12 at [phone]  She needs to pick up contrast and  They will give her drinking instructions

## 2012-09-16 ENCOUNTER — Telehealth (INDEPENDENT_AMBULATORY_CARE_PROVIDER_SITE_OTHER): Payer: Self-pay

## 2012-09-16 NOTE — Telephone Encounter (Signed)
Pt called to verify where she is to go to pick up contrast. Pt advised to go to [facility]. Pt given address.

## 2012-09-18 ENCOUNTER — Other Ambulatory Visit: Payer: No Typology Code available for payment source

## 2012-09-23 ENCOUNTER — Ambulatory Visit
Admission: RE | Admit: 2012-09-23 | Discharge: 2012-09-23 | Disposition: A | Discharge status: Home / Self Care | Payer: No Typology Code available for payment source | Source: Ambulatory Visit | Attending: General Surgery | Admitting: General Surgery

## 2012-09-23 DIAGNOSIS — Z9049 Acquired absence of other specified parts of digestive tract: Secondary | ICD-10-CM

## 2012-09-23 DIAGNOSIS — R1011 Right upper quadrant pain: Secondary | ICD-10-CM

## 2012-09-23 MED ORDER — IOHEXOL 300 MG/ML  SOLN
125.0000 mL | Freq: Once | INTRAMUSCULAR | Status: AC | PRN
  Administered 2012-09-23: 125 mL via INTRAVENOUS

## 2012-09-24 ENCOUNTER — Telehealth (INDEPENDENT_AMBULATORY_CARE_PROVIDER_SITE_OTHER): Payer: Self-pay | Admitting: General Surgery

## 2012-09-24 ENCOUNTER — Telehealth: Payer: Self-pay | Admitting: Family Medicine

## 2012-09-24 NOTE — Telephone Encounter (Signed)
Called to discuss CT results - normal except for fatty liver. No fluid collection, no bile leak, no abscess, no obstruction. CMET and CBC normal. Pt still with persistent RUQ pain. No f/c. Able to eat. Not sure of etiology of persistent RUQ pain. Explained we have ruled out complications from GB surgery. Discussed I would let her PCP know - will need f/u with PCP for possible referral to GI. ?lupus flare

## 2012-09-24 NOTE — Telephone Encounter (Signed)
Please call patient to schedule follow up appointment with me in June per general surgery request.  Thank you!

## 2012-10-06 ENCOUNTER — Ambulatory Visit (INDEPENDENT_AMBULATORY_CARE_PROVIDER_SITE_OTHER): Payer: No Typology Code available for payment source | Admitting: Family Medicine

## 2012-10-06 ENCOUNTER — Encounter: Payer: Self-pay | Admitting: Family Medicine

## 2012-10-06 VITALS — BP 135/92 | HR 69 | Temp 98.7°F | Ht 64.0 in | Wt 210.5 lb

## 2012-10-06 DIAGNOSIS — R109 Unspecified abdominal pain: Secondary | ICD-10-CM

## 2012-10-06 MED ORDER — IBUPROFEN 600 MG PO TABS: 600.0000 mg | ORAL_TABLET | Freq: Three times a day (TID) | ORAL | Status: DC | PRN

## 2012-10-06 NOTE — Progress Notes (Signed)
Subjective:    Patient ID: Veronica [name], female    DOB: [birth year], 52 y.o.   MRN: [id number]  HPI  Patient presents to clinic for follow up after surgery cholecystomy March 21 with complications.  Today, she says she feels fine.  Denies any nausea/vomiting, but coughed up phlegm this AM.   Today, patient complains of RT side pain below RT breast.  Pain started about 3 weeks ago.  Denies any injury or trauma to RT side.  She thinks it could be due to lupus flare; she does not have a rheumatologist anymore.  She used to take daily Prednisone but did not like how it made her feel.  For pain, she takes Tylenol which helps.  Pain comes and goes.  It does not bother her that much.  Denies any dysuria, burning with urination, hematuria.  Denies any vaginal discharge or bleeding.    Review of Systems Per HPI    Objective:   Physical Exam  Constitutional: She appears well-nourished. No distress.  Cardiovascular: Normal rate and regular rhythm.   Pulmonary/Chest: Effort normal and breath sounds normal. She has no wheezes. She has no rales.  Abdominal: Soft. She exhibits no distension. There is tenderness.  There is a superficial, palpable knot on RT side of abdomen that is round and immobile; tender on palpation only.  No redness or induration.  Skin: Skin is warm.      Assessment & Plan:

## 2012-10-08 ENCOUNTER — Encounter: Payer: Self-pay | Admitting: Family Medicine

## 2012-10-08 DIAGNOSIS — R109 Unspecified abdominal pain: Secondary | ICD-10-CM | POA: Insufficient documentation

## 2012-10-08 NOTE — Assessment & Plan Note (Signed)
RT flank pain of unknown etiology, but does not seem to bother her much.  Palpable knot is superficial, could be scar tissue from previous surgery.  She denies any dysuria or GU symptoms.  Continue with Tylenol or Motrin PRN for pain.  Return to clinic if symptoms worsen

## 2012-10-20 ENCOUNTER — Ambulatory Visit: Payer: No Typology Code available for payment source | Admitting: Family Medicine

## 2012-10-24 ENCOUNTER — Telehealth: Payer: Self-pay | Admitting: Family Medicine

## 2012-10-24 NOTE — Telephone Encounter (Signed)
Please let patient know that her copy of medical records are at front desk if she wishes to pick it up at next appointment.  Thanks.

## 2012-10-24 NOTE — Telephone Encounter (Signed)
Pt notified.  Taden Witter L, CMA  

## 2012-12-15 ENCOUNTER — Encounter: Payer: Self-pay | Admitting: Family Medicine

## 2012-12-15 ENCOUNTER — Ambulatory Visit (INDEPENDENT_AMBULATORY_CARE_PROVIDER_SITE_OTHER): Payer: No Typology Code available for payment source | Admitting: Family Medicine

## 2012-12-15 VITALS — BP 138/92 | HR 80 | Temp 98.5°F | Wt 216.0 lb

## 2012-12-15 DIAGNOSIS — I10 Essential (primary) hypertension: Secondary | ICD-10-CM | POA: Diagnosis not present

## 2012-12-15 DIAGNOSIS — R635 Abnormal weight gain: Secondary | ICD-10-CM | POA: Diagnosis not present

## 2012-12-15 DIAGNOSIS — G4700 Insomnia, unspecified: Secondary | ICD-10-CM | POA: Insufficient documentation

## 2012-12-15 DIAGNOSIS — Z13220 Encounter for screening for lipoid disorders: Secondary | ICD-10-CM

## 2012-12-15 LAB — LIPID PANEL
Cholesterol: 179 mg/dL (ref 0–200)
HDL: 43 mg/dL
LDL Cholesterol: 119 mg/dL — ABNORMAL HIGH (ref 0–99)
Total CHOL/HDL Ratio: 4.2 ratio
Triglycerides: 87 mg/dL
VLDL: 17 mg/dL (ref 0–40)

## 2012-12-15 LAB — TSH: TSH: 0.611 u[IU]/mL (ref 0.350–4.500)

## 2012-12-15 MED ORDER — AMLODIPINE BESYLATE 10 MG PO TABS: 10.0000 mg | ORAL_TABLET | Freq: Every day | ORAL | Status: DC

## 2012-12-15 MED ORDER — LISINOPRIL-HYDROCHLOROTHIAZIDE 20-12.5 MG PO TABS: 1.0000 | ORAL_TABLET | Freq: Two times a day (BID) | ORAL | Status: DC

## 2012-12-15 NOTE — Assessment & Plan Note (Addendum)
A: BP 136/92. Not taking amlodipine due it causing her gagging and has not been taking it for the last week. This could contribute to her reading today as well as her recent weight gain.  P: Continue current meds. Told her Amlodipine can be chewed if trouble swallowing.  - DASH diet encouraged and provided to her - Follow up in one month, measure BP, possible dosage change if compliant with medication  - TSH and non fasting lipid panel was ordered

## 2012-12-15 NOTE — Patient Instructions (Addendum)
Nice to meet you,   I think that you having a busy schedule but still need to make time for a good diet and exercise. I want you to take your blood pressure medication as directive. You can chew your Amlodipine if you are having trouble swallowing it. I want you to try to exercise at least 30 minutes a day and establish a DASH diet.   I printed you off some material in regards to establishing a good sleep hygiene. I want you to try these different techniques to help you establish a good routine. You can try melatonin if you feel like this routine is not working for you and it can be bought over the counter and take as directed.   I will want you to follow up in a month with me to make sure that your blood pressure is within a good range. We can also refer you to a OB/GYN for a mammogram for screening.   Thank you.

## 2012-12-15 NOTE — Progress Notes (Signed)
Subjective:    Patient ID: Veronica [name], female    DOB: [birth year], 52 y.o.   MRN: [id number]  HPI  1. Trouble sleeping: She has been waking up at night and not being able to get back to sleep. She has been taken Benadryl to help her fall asleep. She has racing thoughts. This has been going on for years. She use to listen to gospel music to help her fall asleep. She has been evaluated previously for sleep apnea but she was found not to have it.   2. HYPERTENSION Disease Monitoring: Blood pressure range-136/92 Chest pain, palpitations- none      Dyspnea- no Medications:Amlodipine, Lisinopril/HCTZ Compliance- hasn't taken amlodipine for the last week due to trouble swallowing it Lightheadedness,Syncope- no   Edema- ankle swelling at the end of the day   She doesn't adhere to a diet and doesn't exercise regularly. When her blood pressure is elevated she has headaches. They are frontal in distribution, 5/10, achy and she has previously had HA with high blood pressure. She drinks sugary beverages and only has one caffienated beverage a week. She has had a recent weight gain of ten pounds. She had a cholecystectomy in March where she lost 35 lbs but seems to be putting the weight back on from that.   Review of Systems WNL other than noted above     Objective:   Physical Exam  Gen: NAD, alert, cooperative with exam CV: RRR, good S1/S2, no murmur Resp: CTABL, no wheezes, non-labored Abd: SNTND, BS present, no guarding or organomegaly Ext: No edema, warm and well perfused  Neuro: Alert and oriented, No gross deficits     Assessment & Plan:  1. Insomnia: does not practice a good sleep hygiene before bed. She wakes up during the night with racing thoughts.  - Encouraged to establish a good sleep hygiene and a hand out was provided to her  - Melatonin could be added if the sleep hygiene is not helping  - follow up in one month if sleeping is not improved with recommendations.

## 2013-01-09 ENCOUNTER — Ambulatory Visit (INDEPENDENT_AMBULATORY_CARE_PROVIDER_SITE_OTHER): Payer: No Typology Code available for payment source | Admitting: Family Medicine

## 2013-01-09 ENCOUNTER — Encounter: Payer: Self-pay | Admitting: Family Medicine

## 2013-01-09 VITALS — BP 137/81 | HR 74 | Ht 64.0 in | Wt 218.0 lb

## 2013-01-09 DIAGNOSIS — Z23 Encounter for immunization: Secondary | ICD-10-CM | POA: Diagnosis not present

## 2013-01-09 DIAGNOSIS — N644 Mastodynia: Secondary | ICD-10-CM | POA: Diagnosis not present

## 2013-01-09 MED ORDER — IBUPROFEN 600 MG PO TABS: 600.0000 mg | ORAL_TABLET | Freq: Three times a day (TID) | ORAL | Status: DC | PRN

## 2013-01-09 NOTE — Progress Notes (Signed)
Subjective:     Patient ID: Veronica [name], female   DOB: April 14, 1961, 52 y.o.   MRN: [id number]  HPI  1. Breast pain: She dates a two-week history of bilateral breast pain. The pain is located laterally and is achy in nature, being a 7/10 at its worst. She has had no trauma, discharge or discoloration that she has noted. She had been taking Motrin for pain but has nothing at the time. The Motrin did help alleviate the pain. The pain is worse when she removes her bra and has been sleeping with a bra. When she has a brassiere on and there is no pain.  She describes both breasts as feeling fuller in nature as of late. She has never had any previous breast pain. She had an abdominal partial hysterectomy in 2010 and is not currently menstruating.  Will turn 52 years old as of next week. Has no family history of breast cancer.   Health maintenance: Recommend a flu shot today  Review of Systems Within normal limits other than noted above    Objective:   Physical Exam BP 137/81  Pulse 74  Ht 5\' 4"  (1.626 m)  Wt 218 lb (98.884 kg)  BMI 37.4 kg/m2 Gen: NAD, alert, cooperative with exam CV: RRR, good S1/S2, no murmur Resp: CTABL, no wheezes, non-labored Breast: symmetrical, no discharge, Lateral aspect of each breast is tender to palpation. No masses, nodules, or pitting noted. No erythema. No supraclavicular, infraclavicular or axillary lymph nodes appreciated  Ext: No edema, warm     Assessment:     1. Breat pain  2. Health Maintenance.      Plan:     2.  Received a flu vaccine today.

## 2013-01-09 NOTE — Patient Instructions (Addendum)
Thank you for coming in,   I sent in Motrin for your breast pain.   An appointment will be made for you today for a mammogram. If you need to change your appointment date or time then you can call and change it.    We gave you a flu shot today so you will be covered for one year.    Please feel free to call with any questions or concerns at any time, at [phone]. --Dr. Jordan Likes

## 2013-01-09 NOTE — Assessment & Plan Note (Signed)
Could be related to onset of menopause. Sent for diagnostic mammogram.  - Motrin for pain  - Pending mammogram results: Encourage a well fitting brassiere, warm compress, ice packs, or gentle massage.

## 2013-01-21 ENCOUNTER — Ambulatory Visit: Payer: No Typology Code available for payment source

## 2013-01-26 ENCOUNTER — Ambulatory Visit
Admission: RE | Admit: 2013-01-26 | Discharge: 2013-01-26 | Disposition: A | Discharge status: Home / Self Care | Payer: No Typology Code available for payment source | Source: Ambulatory Visit | Attending: Family Medicine | Admitting: Family Medicine

## 2013-01-26 DIAGNOSIS — N644 Mastodynia: Secondary | ICD-10-CM

## 2013-03-05 ENCOUNTER — Encounter: Payer: Self-pay | Admitting: Family Medicine

## 2013-03-05 ENCOUNTER — Ambulatory Visit (INDEPENDENT_AMBULATORY_CARE_PROVIDER_SITE_OTHER): Payer: No Typology Code available for payment source | Admitting: Family Medicine

## 2013-03-05 VITALS — BP 128/87 | HR 72 | Temp 98.4°F | Wt 220.0 lb

## 2013-03-05 DIAGNOSIS — N644 Mastodynia: Secondary | ICD-10-CM

## 2013-03-05 NOTE — Progress Notes (Signed)
Subjective:     Patient ID: Veronica [name], female   DOB: 21-Apr-1961, 52 y.o.   MRN: [id number]  HPI  Collie Kittel is a 52 year old F presenting in a follow up for breat pain.   Mastalgia: She reports bilateral lateral breat pain. This is similar to the pain she had had previously. It just restarted about a week ago. She describes it as throbbing and achy. She takes ibuprofen as needed. That seems to help a little but doesn't completely relieve the pain. The pain is better when she is wearing a bra and worse when she is not. The pain first started about a month ago. She has not had a menstrual cycle since 2010. She has recently had a diagnotic mammogram that was normal. The pain doesn't radiate to her flank or into her back. It is worse at the end of the day. She has not tried applying any heat or ice. She has not tried any exercises or stretching to relieve the pain.   Review of Systems All other systems reviewed and otherwise normal.      Objective:   Physical Exam BP 128/87  Pulse 72  Temp(Src) 98.4 F (36.9 C) (Oral)  Wt 220 lb (99.791 kg) Gen: NAD, alert, cooperative with exam CV: RRR, good S1/S2, no murmur Resp: CTABL, no wheezes, non-labored Breast Exam: no dimples note. No discoloration of breast or nipple, no tenderness on exam, no masses palpated, she localized pain to the upper outer quadrant bilaterally. Macromastia. Symmetric breasts.      Assessment:     Mastalgia    Plan:

## 2013-03-05 NOTE — Assessment & Plan Note (Signed)
Re-occurring breat pain. Normal Mammogram. Pain could be attributed to the size of her breasts but question as to why it would start now. No menstrual cycle since 2010.  - continue Ibuprofen as needed  - wear appropriate fitting brassiere's  - try heat and ice in 20 min intervals as needed - f/u in a month if still persistent, consider referral to breat center

## 2013-03-05 NOTE — Patient Instructions (Signed)
Thank you for coming in,   I want you to try using an ice pack and alternating with heat. Only heat or ice for 20 minutes at a time. Also wear appropriate fitting brassiere's.    Continue to take the ibuprofen as needed. Follow up with me in a month if you are still having pain, then we can either refer you to the Breat center.    Please feel free to call with any questions or concerns at any time, at [phone]. --Dr. Jordan Likes

## 2013-05-05 ENCOUNTER — Ambulatory Visit: Payer: No Typology Code available for payment source | Admitting: Family Medicine

## 2013-05-05 ENCOUNTER — Encounter: Payer: Self-pay | Admitting: Family Medicine

## 2013-05-05 VITALS — BP 126/70 | HR 67 | Temp 98.2°F | Ht 64.0 in | Wt 226.0 lb

## 2013-05-05 DIAGNOSIS — R51 Headache: Secondary | ICD-10-CM

## 2013-05-05 DIAGNOSIS — R42 Dizziness and giddiness: Secondary | ICD-10-CM | POA: Diagnosis not present

## 2013-05-05 MED ORDER — MECLIZINE HCL 25 MG PO TABS: 25.0000 mg | ORAL_TABLET | Freq: Three times a day (TID) | ORAL | Status: DC | PRN

## 2013-05-05 NOTE — Patient Instructions (Signed)
I am placing a referral for vestibular rehab.  I am also treating you with meclizine for your dizziness.  In regards to your headache, use OTC Tylenol and NSAID's (excedrin) as needed for headache.  Be cautious to avoid overuse.    Follow up with your PCP as needed.

## 2013-05-06 ENCOUNTER — Telehealth: Payer: Self-pay | Admitting: Family Medicine

## 2013-05-06 DIAGNOSIS — R42 Dizziness and giddiness: Secondary | ICD-10-CM | POA: Insufficient documentation

## 2013-05-06 NOTE — Assessment & Plan Note (Signed)
Symptoms and clinical picture consistent with BPPV.  Will send patient to vestibular rehabilitation.  Also gave meclizine for symptomatic relief.

## 2013-05-06 NOTE — Progress Notes (Signed)
Subjective:     Patient ID: Veronica [name], female   DOB: 06-16-1960, 53 y.o.   MRN: [id number]  HPI 53 year old female with HTN presents for evaluation of dizziness and headache.   1) Dizziness  Patient reports that she's been feeling dizzy for approximately 3 week.  She describes it as periods of feeling as if the room is "spinning". She also states that her eyes "jump".  She denies feeling as if she is going to pass out. He denies any feelings of imbalance or disequilibrium.  She also denies any vision changes, hearing loss, tinnitus, weakness, numbness, tingling, difficulties with speech.  She's been taking Dramamine for her symptoms. Does provide relief. However, once it wears off her symptoms return.   2) Headache  Patient will she's had frequent headaches over the past 3 weeks.  Pain is typically located frontally and his bilateral.  Pain described as throbbing in character.  She denies any associated nausea, vomiting, photophobia, phonophobia.  She's been taking ibuprofen as needed with improvement of her symptoms.  She denies any red flag symptoms: Fevers, vision changes, extremity weakness, speech difficulties, focal neurological deficits.  Review of Systems Per HPI    Objective:   Physical Exam Filed Vitals:   05/05/13 1551  BP: 126/70  Pulse: 67  Temp: 98.2 F (36.8 C)  Exam: General: well appearing female, pleasant, conversant; NAD.  HEENT: NCAT.  Normal TM's bilaterally.  Oropharynx clear.  Cardiovascular: RRR. No murmurs, rubs, or gallops. Respiratory: CTAB. No rales, rhonchi, or wheeze. Abdomen: soft, nontender, nondistended. Neuro: No focal deficits on exam. Negative Dix-Hallpike (no nystagmus noted).  Patient did have reproduction of symptoms with maneuver.     Assessment/Plan:  See Problem List

## 2013-05-06 NOTE — Assessment & Plan Note (Signed)
Consistent with Tension type headache.  Advised continue use of Motrin as needed.  Advised caution as can lead to medication overuse headache if she uses on a  daily/frequent basis.Marland Kitchen

## 2013-05-06 NOTE — Telephone Encounter (Signed)
Due to lack of insurance and affordability, I suggest use of Dramamine (which she has been using) or Benadryl as needed.

## 2013-05-06 NOTE — Telephone Encounter (Signed)
Pt was seen today by Dr. Adriana Simas and prescribed Meclizine. She went to pick it up and it is 39.00 and she would like Dr. Adriana Simas to call something in on the 4.00 list. jw

## 2013-05-07 NOTE — Telephone Encounter (Addendum)
Called [phone] and number is not connected. The [phone] does not have a mailbox that is set up.

## 2013-05-07 NOTE — Telephone Encounter (Signed)
Pt wants to know what the benedryl is for

## 2013-05-07 NOTE — Telephone Encounter (Signed)
Benadryl OR Dramamine is for dizziness.

## 2013-05-26 ENCOUNTER — Other Ambulatory Visit: Payer: Self-pay | Admitting: Family Medicine

## 2013-08-17 ENCOUNTER — Other Ambulatory Visit: Payer: Self-pay | Admitting: Family Medicine

## 2013-08-18 ENCOUNTER — Other Ambulatory Visit: Payer: Self-pay | Admitting: *Deleted

## 2013-08-20 ENCOUNTER — Other Ambulatory Visit: Payer: Self-pay | Admitting: *Deleted

## 2013-08-20 ENCOUNTER — Telehealth: Payer: Self-pay | Admitting: Family Medicine

## 2013-08-20 DIAGNOSIS — I10 Essential (primary) hypertension: Secondary | ICD-10-CM

## 2013-08-20 MED ORDER — AMLODIPINE BESYLATE 10 MG PO TABS: 10.0000 mg | ORAL_TABLET | Freq: Every day | ORAL | Status: DC

## 2013-08-20 MED ORDER — POTASSIUM CHLORIDE ER 10 MEQ PO TBCR: 10.0000 meq | EXTENDED_RELEASE_TABLET | Freq: Every day | ORAL | Status: DC

## 2013-08-20 MED ORDER — LISINOPRIL-HYDROCHLOROTHIAZIDE 20-12.5 MG PO TABS: 1.0000 | ORAL_TABLET | Freq: Two times a day (BID) | ORAL | Status: DC

## 2013-08-20 NOTE — Telephone Encounter (Signed)
Veronica [name] called back to ask also for her Amlodipine refill.  The Lisinopril and Potassium have already been sent from pharmacy electronically

## 2013-08-20 NOTE — Telephone Encounter (Signed)
Left voice message that medication has been refilled and to check with her pharmacy.  Clovis Pu, RN

## 2013-08-20 NOTE — Telephone Encounter (Signed)
Left voice message for to let me know what medications needed to refill.  Per Dr. Jordan Likes, does not recall receiving any refill request for this pt.  Clovis Pu, RN

## 2013-08-20 NOTE — Telephone Encounter (Signed)
Mrs. Doto went to pharmacy to pick up meds and was informed that they had not been refilled yet.  Need to know if we could get another provider to authorize order.  Refills request came thru from pharmacy electronically

## 2013-08-21 ENCOUNTER — Other Ambulatory Visit: Payer: Self-pay | Admitting: Family Medicine

## 2013-08-21 ENCOUNTER — Other Ambulatory Visit: Payer: Self-pay | Admitting: *Deleted

## 2013-08-21 DIAGNOSIS — N644 Mastodynia: Secondary | ICD-10-CM

## 2013-08-21 NOTE — Telephone Encounter (Signed)
Pt called to inquire about medication refill. Which pharmacy medication was sent.  Pt informed that medication was sent to [facility] off [facility].  Pt stated understanding.  Clovis Pu, RN

## 2013-10-29 ENCOUNTER — Ambulatory Visit: Payer: Self-pay | Admitting: Family Medicine

## 2013-11-03 ENCOUNTER — Encounter: Payer: Self-pay | Admitting: Family Medicine

## 2013-11-03 ENCOUNTER — Ambulatory Visit: Payer: No Typology Code available for payment source | Admitting: Family Medicine

## 2013-11-03 VITALS — BP 131/76 | HR 79 | Temp 98.9°F | Ht 64.0 in | Wt 232.0 lb

## 2013-11-03 DIAGNOSIS — Z111 Encounter for screening for respiratory tuberculosis: Secondary | ICD-10-CM | POA: Diagnosis not present

## 2013-11-03 DIAGNOSIS — R110 Nausea: Secondary | ICD-10-CM | POA: Diagnosis not present

## 2013-11-03 DIAGNOSIS — M25569 Pain in unspecified knee: Secondary | ICD-10-CM

## 2013-11-03 DIAGNOSIS — I10 Essential (primary) hypertension: Secondary | ICD-10-CM

## 2013-11-03 DIAGNOSIS — M25561 Pain in right knee: Secondary | ICD-10-CM | POA: Insufficient documentation

## 2013-11-03 DIAGNOSIS — N644 Mastodynia: Secondary | ICD-10-CM

## 2013-11-03 MED ORDER — ONDANSETRON HCL 4 MG PO TABS: 4.0000 mg | ORAL_TABLET | Freq: Three times a day (TID) | ORAL | Status: DC | PRN

## 2013-11-03 MED ORDER — IBUPROFEN 600 MG PO TABS: 600.0000 mg | ORAL_TABLET | Freq: Three times a day (TID) | ORAL | Status: DC | PRN

## 2013-11-03 NOTE — Assessment & Plan Note (Signed)
Most likely prepatellar bursitis. No indication of meniscal injury or ligament injury or strain. No fall or trauma.  - ibuprofen 600 mg daily for two weeks - if not improved after two weeks then f/u  - aspiration if continue pain at 2 week f/u  - compression over knee as needed  - injection if pain persists

## 2013-11-03 NOTE — Progress Notes (Signed)
Subjective:    Patient ID: Veronica [name], female    DOB: 1961-03-02, 53 y.o.   MRN: [id number]  HPI  Veronica [name] is here for BP f/u.  HTN Disease Monitoring:none Home BP Monitoring 130/90 Chest pain- no    Dyspnea- no Medications:amlodipine, lisinopril/HCTZ  Compliance-  yes. Lightheadedness-  no  Edema- yes. Swelling two weeks ago.   Left knee: was cleaning the bathroom on her knees. She couldn't straighten her knee afterwards. It hurts worse than yesterday. She can walk but with a limp today. No history of injury. Didn't feel a pop or locking her knee. Worse with walking and better with sitting. Hasn't tried any medications. No erythema or penetrating trauma.    Current Outpatient Prescriptions on File Prior to Visit  Medication Sig Dispense Refill  . amLODipine (NORVASC) 10 MG tablet Take 1 tablet (10 mg total) by mouth daily.  90 tablet  2  . ibuprofen (ADVIL,MOTRIN) 600 MG tablet Take 1 tablet (600 mg total) by mouth every 8 (eight) hours as needed for pain.  90 tablet  0  . lisinopril-hydrochlorothiazide (PRINZIDE,ZESTORETIC) 20-12.5 MG per tablet Take 1 tablet by mouth 2 (two) times daily.  90 tablet  3  . meclizine (ANTIVERT) 25 MG tablet Take 1 tablet (25 mg total) by mouth 3 (three) times daily as needed for dizziness.  90 tablet  0  . Multiple Vitamin (MULTIVITAMIN WITH MINERALS) TABS Take 1 tablet by mouth daily.      . potassium chloride (K-DUR) 10 MEQ tablet Take 1 tablet (10 mEq total) by mouth daily.  90 tablet  3   No current facility-administered medications on file prior to visit.   Review of Systems See HPI    Objective:   Physical Exam BP 131/76  Pulse 79  Temp(Src) 98.9 F (37.2 C) (Oral)  Ht 5\' 4"  (1.626 m)  Wt 232 lb (105.235 kg)  BMI 39.80 kg/m2 Gen: NAD, alert, cooperative with exam, well-appearing CV: RRR, good S1/S2, no murmur, no edema, capillary refill brisk  Resp: CTABL, no wheezes, non-labored ION:GEXBM knee:  no prepatellar  tenderness, Pain upon flexion, no meniscal pain upon palpation, 5/5 strength in flexion and extension.      Assessment & Plan:

## 2013-11-03 NOTE — Patient Instructions (Signed)
Thank you for coming in,   [place] job on the blood pressure. Please let me know if your swelling comes back and we can change your medication if necessary.   Prepatellar bursitis is what is causing your knee pain. I have sent in ibuprofen 600 mg daily. If you take this for two weeks and your knee doesn't get better then please come back. You can also try ice, compression and elevation.    Please feel free to call with any questions or concerns at any time, at [phone]. --Dr. Jordan Likes

## 2013-11-03 NOTE — Assessment & Plan Note (Signed)
Controlled. Reports having increased feet swelling on her trip to [place]. Swelling has gone away. Will monitor for edema to occur again. Is willing to continue current treatment.  - amlodipine  - lisinopril/hctz.  - f/u in 6 months or sooner as needed.

## 2013-11-06 ENCOUNTER — Encounter: Payer: Self-pay | Admitting: *Deleted

## 2013-11-06 ENCOUNTER — Ambulatory Visit (INDEPENDENT_AMBULATORY_CARE_PROVIDER_SITE_OTHER): Payer: No Typology Code available for payment source | Admitting: *Deleted

## 2013-11-06 DIAGNOSIS — Z111 Encounter for screening for respiratory tuberculosis: Secondary | ICD-10-CM

## 2013-11-06 LAB — TB SKIN TEST
Induration: 0 mm
TB Skin Test: NEGATIVE

## 2014-03-11 ENCOUNTER — Other Ambulatory Visit: Payer: Self-pay | Admitting: Family Medicine

## 2014-03-11 DIAGNOSIS — I10 Essential (primary) hypertension: Secondary | ICD-10-CM

## 2014-03-11 NOTE — Telephone Encounter (Signed)
Pt called and needs a refill on her lisinopril sent in to [facility]. jw

## 2014-03-12 MED ORDER — LISINOPRIL-HYDROCHLOROTHIAZIDE 20-12.5 MG PO TABS: 1.0000 | ORAL_TABLET | Freq: Two times a day (BID) | ORAL | Status: DC

## 2014-03-12 NOTE — Telephone Encounter (Signed)
LVM for patient to call back to inform her that Dr. Jordan Likes has sent in her requested rx

## 2014-03-15 NOTE — Telephone Encounter (Signed)
LVM for patient to call back. After numerous try to reach patient, I am closing chart. Please inform patient if she calls back

## 2014-05-27 ENCOUNTER — Ambulatory Visit (INDEPENDENT_AMBULATORY_CARE_PROVIDER_SITE_OTHER): Payer: No Typology Code available for payment source | Admitting: Family Medicine

## 2014-05-27 ENCOUNTER — Encounter: Payer: Self-pay | Admitting: Family Medicine

## 2014-05-27 VITALS — BP 146/77 | HR 74 | Temp 98.1°F | Ht 64.0 in | Wt 234.4 lb

## 2014-05-27 DIAGNOSIS — I10 Essential (primary) hypertension: Secondary | ICD-10-CM

## 2014-05-27 DIAGNOSIS — J069 Acute upper respiratory infection, unspecified: Secondary | ICD-10-CM | POA: Diagnosis not present

## 2014-05-27 MED ORDER — CETIRIZINE HCL 10 MG PO TABS: 10.0000 mg | ORAL_TABLET | Freq: Every day | ORAL | Status: DC

## 2014-05-27 MED ORDER — AMLODIPINE BESYLATE 10 MG PO TABS: 10.0000 mg | ORAL_TABLET | Freq: Every day | ORAL | Status: DC

## 2014-05-27 MED ORDER — POTASSIUM CHLORIDE ER 10 MEQ PO TBCR: 10.0000 meq | EXTENDED_RELEASE_TABLET | Freq: Every day | ORAL | Status: DC

## 2014-05-27 MED ORDER — GUAIFENESIN-DM 100-10 MG/5ML PO SYRP: 5.0000 mL | ORAL_SOLUTION | ORAL | Status: DC | PRN

## 2014-05-27 MED ORDER — LISINOPRIL-HYDROCHLOROTHIAZIDE 20-12.5 MG PO TABS: 1.0000 | ORAL_TABLET | Freq: Two times a day (BID) | ORAL | Status: DC

## 2014-05-27 NOTE — Patient Instructions (Addendum)
Thank you for coming in,   Most likely you have a URI.   You can purchase Afrin over the counter and use it for three days.   If you don't see any improvement in the next 7-10 days then let me know.    Please feel free to call with any questions or concerns at any time, at [phone]. --Dr. Jordan Likes  Upper Respiratory Infection, Adult An upper respiratory infection (URI) is also sometimes known as the common cold. The upper respiratory tract includes the nose, sinuses, throat, trachea, and bronchi. Bronchi are the airways leading to the lungs. Most people improve within 1 week, but symptoms can last up to 2 weeks. A residual cough may last even longer.  CAUSES Many different viruses can infect the tissues lining the upper respiratory tract. The tissues become irritated and inflamed and often become very moist. Mucus production is also common. A cold is contagious. You can easily spread the virus to others by oral contact. This includes kissing, sharing a glass, coughing, or sneezing. Touching your mouth or nose and then touching a surface, which is then touched by another person, can also spread the virus. SYMPTOMS  Symptoms typically develop 1 to 3 days after you come in contact with a cold virus. Symptoms vary from person to person. They may include:  Runny nose.  Sneezing.  Nasal congestion.  Sinus irritation.  Sore throat.  Loss of voice (laryngitis).  Cough.  Fatigue.  Muscle aches.  Loss of appetite.  Headache.  Low-grade fever. DIAGNOSIS  You might diagnose your own cold based on familiar symptoms, since most people get a cold 2 to 3 times a year. Your caregiver can confirm this based on your exam. Most importantly, your caregiver can check that your symptoms are not due to another disease such as strep throat, sinusitis, pneumonia, asthma, or epiglottitis. Blood tests, throat tests, and X-rays are not necessary to diagnose a common cold, but they may sometimes be  helpful in excluding other more serious diseases. Your caregiver will decide if any further tests are required. RISKS AND COMPLICATIONS  You may be at risk for a more severe case of the common cold if you smoke cigarettes, have chronic heart disease (such as heart failure) or lung disease (such as asthma), or if you have a weakened immune system. The very young and very old are also at risk for more serious infections. Bacterial sinusitis, middle ear infections, and bacterial pneumonia can complicate the common cold. The common cold can worsen asthma and chronic obstructive pulmonary disease (COPD). Sometimes, these complications can require emergency medical care and may be life-threatening. PREVENTION  The best way to protect against getting a cold is to practice good hygiene. Avoid oral or hand contact with people with cold symptoms. Wash your hands often if contact occurs. There is no clear evidence that vitamin C, vitamin E, echinacea, or exercise reduces the chance of developing a cold. However, it is always recommended to get plenty of rest and practice good nutrition. TREATMENT  Treatment is directed at relieving symptoms. There is no cure. Antibiotics are not effective, because the infection is caused by a virus, not by bacteria. Treatment may include:  Increased fluid intake. Sports drinks offer valuable electrolytes, sugars, and fluids.  Breathing heated mist or steam (vaporizer or shower).  Eating chicken soup or other clear broths, and maintaining good nutrition.  Getting plenty of rest.  Using gargles or lozenges for comfort.  Controlling fevers with ibuprofen or acetaminophen  as directed by your caregiver.  Increasing usage of your inhaler if you have asthma. Zinc gel and zinc lozenges, taken in the first 24 hours of the common cold, can shorten the duration and lessen the severity of symptoms. Pain medicines may help with fever, muscle aches, and throat pain. A variety of  non-prescription medicines are available to treat congestion and runny nose. Your caregiver can make recommendations and may suggest nasal or lung inhalers for other symptoms.  HOME CARE INSTRUCTIONS   Only take over-the-counter or prescription medicines for pain, discomfort, or fever as directed by your caregiver.  Use a warm mist humidifier or inhale steam from a shower to increase air moisture. This may keep secretions moist and make it easier to breathe.  Drink enough water and fluids to keep your urine clear or pale yellow.  Rest as needed.  Return to work when your temperature has returned to normal or as your caregiver advises. You may need to stay home longer to avoid infecting others. You can also use a face mask and careful hand washing to prevent spread of the virus. SEEK MEDICAL CARE IF:   After the first few days, you feel you are getting worse rather than better.  You need your caregiver's advice about medicines to control symptoms.  You develop chills, worsening shortness of breath, or brown or red sputum. These may be signs of pneumonia.  You develop yellow or brown nasal discharge or pain in the face, especially when you bend forward. These may be signs of sinusitis.  You develop a fever, swollen neck glands, pain with swallowing, or white areas in the back of your throat. These may be signs of strep throat. SEEK IMMEDIATE MEDICAL CARE IF:   You have a fever.  You develop severe or persistent headache, ear pain, sinus pain, or chest pain.  You develop wheezing, a prolonged cough, cough up blood, or have a change in your usual mucus (if you have chronic lung disease).  You develop sore muscles or a stiff neck. Document Released: 10/10/2000 Document Revised: 07/09/2011 Document Reviewed: 07/22/2013 [facility] Patient Information 2015 [place], [place]. This information is not intended to replace advice given to you by your health care provider. Make sure you discuss any  questions you have with your health care provider.

## 2014-05-28 DIAGNOSIS — J069 Acute upper respiratory infection, unspecified: Secondary | ICD-10-CM | POA: Insufficient documentation

## 2014-05-28 NOTE — Progress Notes (Signed)
    Subjective   Veronica [name] is a 54 y.o. female that presents for a same day visit  URI  Has been sick in Dec then resolved then occurred again.  Nasal discharge: yes Medications tried: Mucinex, cough/cold, congestion sinus Sick contacts: yes  Symptoms Fever: no Headache or face pain: no Tooth pain: no Sneezing: no Scratchy throat: yes Allergies: no Muscle aches: no Severe fatigue Stiff neck: no Shortness of breath: no Rash: no Sore throat or swollen glands: no   ROS see HPI Smoking Status noted    History  Substance Use Topics  . Smoking status: Never Smoker   . Smokeless tobacco: Never Used  . Alcohol Use: No    ROS Per HPI  Objective   BP 146/77 mmHg  Pulse 74  Temp(Src) 98.1 F (36.7 C) (Oral)  Ht 5\' 4"  (1.626 m)  Wt 234 lb 6.4 oz (106.323 kg)  BMI 40.21 kg/m2  General: Well appearing, NAD, alert  HEENT: Tm's clear and intact, EOMI, PERRL, no LAD, Turbinates swollen, oropharynx clear, no tonsillar exudates  Respiratory/Chest: CTAB, no wheezing  Cardiovascular: RRR, S1S2   Assessment and Plan   Please refer to problem based charting of assessment and plan

## 2014-05-28 NOTE — Assessment & Plan Note (Signed)
Symptoms most likely 2/2 to URI.  - zyrtec  - Robitussin DM  - Afrin for three days  - f/u if symptoms persist 10 days from now

## 2014-06-28 ENCOUNTER — Encounter: Payer: Self-pay | Admitting: Family Medicine

## 2014-06-28 ENCOUNTER — Ambulatory Visit: Payer: No Typology Code available for payment source | Admitting: Family Medicine

## 2014-06-28 VITALS — BP 136/93 | HR 76 | Temp 98.2°F | Ht 64.0 in | Wt 233.0 lb

## 2014-06-28 DIAGNOSIS — R42 Dizziness and giddiness: Secondary | ICD-10-CM | POA: Diagnosis not present

## 2014-06-28 MED ORDER — MECLIZINE HCL 25 MG PO TABS: 25.0000 mg | ORAL_TABLET | Freq: Three times a day (TID) | ORAL | Status: DC | PRN

## 2014-06-28 NOTE — Patient Instructions (Signed)
Thank you for coming in, today!  I think you have vertigo, but I'm not 100% sure what's causing it. I want you to try taking meclizine 25 mg up to 3 times per day as needed. I will refer you to the "vestibular rehab" physical therapists. They can help [place] figure out what's causing your vertigo and can help treat it in addition to your medicine. If you haven't heard from [place] or them the next couple of weeks with an appointment, call us back to ask about the referral.  Come back to see Dr. Jordan Likes in about 2 weeks. If you need to be seen sooner, call any time.  Please feel free to call with any questions or concerns at any time, at [phone]. --Dr. Casper Harrison
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
## 2014-07-16 ENCOUNTER — Ambulatory Visit: Payer: No Typology Code available for payment source | Admitting: Family Medicine

## 2014-07-16 ENCOUNTER — Ambulatory Visit
Admission: RE | Admit: 2014-07-16 | Discharge: 2014-07-16 | Disposition: A | Discharge status: Home / Self Care | Payer: No Typology Code available for payment source | Source: Ambulatory Visit | Attending: Family Medicine | Admitting: Family Medicine

## 2014-07-16 ENCOUNTER — Encounter: Payer: Self-pay | Admitting: Family Medicine

## 2014-07-16 ENCOUNTER — Encounter: Payer: Self-pay | Admitting: *Deleted

## 2014-07-16 VITALS — BP 144/82 | HR 94 | Temp 98.2°F | Wt 230.0 lb

## 2014-07-16 DIAGNOSIS — R05 Cough: Secondary | ICD-10-CM

## 2014-07-16 DIAGNOSIS — R058 Other specified cough: Secondary | ICD-10-CM

## 2014-07-16 DIAGNOSIS — J189 Pneumonia, unspecified organism: Secondary | ICD-10-CM | POA: Insufficient documentation

## 2014-07-16 MED ORDER — BENZONATATE 200 MG PO CAPS: 200.0000 mg | ORAL_CAPSULE | Freq: Two times a day (BID) | ORAL | Status: DC | PRN

## 2014-07-16 MED ORDER — LEVOFLOXACIN 750 MG PO TABS: 750.0000 mg | ORAL_TABLET | Freq: Every day | ORAL | Status: DC

## 2014-07-16 NOTE — Progress Notes (Signed)
Patient ID: Veronica [name], female   DOB: October 13, 1960, 54 y.o.   MRN: [id number]  Veronica Alar, MD Phone: [phone]  Veronica [name] is a 54 y.o. female who presents today for same day appointment.   Patient reports one week of productive cough and hoarseness. Some sore throat. She notes also have nasal congestion, though is unsure if she has sinus congestion. She notes chills and sweats though no fevers. Denies dyspnea. Was seen in January and diagnosed with URI per the patient. Was treated with zyrtec and robitussin. This resolved. Now with newly developed symptoms.   Patient is a nonsmoker.   ROS: Per HPI   Physical Exam Filed Vitals:   07/16/14 0930  BP: 144/82  Pulse:   Temp:     Gen: NAD HEENT: PERRL,  MMM, TMs bilaterally with clear fluid behind them, no bulging, mild erythema of posterior OP, no cervical LAD Lungs: CTABL Nl WOB Heart: RRR    Assessment/Plan: Please see individual problem list.  Veronica Alar, MD [facility] PGY-3

## 2014-07-16 NOTE — Assessment & Plan Note (Addendum)
Patient with new symptoms of productive cough, chills, and sweats. Vitals stable. CXR with evidence of PNA. Given history of SLE will treat CAP with levaquin for 5 days. Will treat cough with tessalon. Is to continue zyrtec. Given return precautions.

## 2014-07-16 NOTE — Patient Instructions (Signed)
Nice to meet you. Please go get the chest x-ray and then we will send in an antibiotic based off of what this shows.  If you develop shortness of breath, fever, worsening cough please seek medical attention.

## 2014-07-19 ENCOUNTER — Encounter: Payer: Self-pay | Admitting: Family Medicine

## 2014-07-19 ENCOUNTER — Ambulatory Visit: Payer: No Typology Code available for payment source | Admitting: Family Medicine

## 2014-07-19 ENCOUNTER — Telehealth: Payer: Self-pay | Admitting: Family Medicine

## 2014-07-19 VITALS — BP 142/88 | HR 87 | Temp 98.3°F | Ht 64.0 in | Wt 231.2 lb

## 2014-07-19 DIAGNOSIS — I10 Essential (primary) hypertension: Secondary | ICD-10-CM

## 2014-07-19 DIAGNOSIS — T783XXA Angioneurotic edema, initial encounter: Secondary | ICD-10-CM

## 2014-07-19 MED ORDER — LOSARTAN POTASSIUM 100 MG PO TABS: 100.0000 mg | ORAL_TABLET | Freq: Every day | ORAL | Status: DC

## 2014-07-19 MED ORDER — HYDROCHLOROTHIAZIDE 12.5 MG PO TABS: 12.5000 mg | ORAL_TABLET | Freq: Every day | ORAL | Status: DC

## 2014-07-19 NOTE — Telephone Encounter (Signed)
Pt called and would ike to speak to the nurse because she was seen by Dr. Birdie Sons on Friday and the medication she was given has caused her lips to swell. Please call to discuss. jw

## 2014-07-19 NOTE — Telephone Encounter (Signed)
Will forward to clinic nurse, asha. Jaymie Misch,CMA

## 2014-07-19 NOTE — Progress Notes (Addendum)
Patient ID: Veronica [name], female   DOB: [birth year], 54 y.o.   MRN: [id number]    Subjective: CC: Lip swelling after new medication  HPI: Patient is a 54 y.o. female with a past medical history of SLE presenting to clinic today due to lip swelling and eye swelling after taking Tessalon capsules. The patient was diagnosed with CAP on 3/18 and was prescribed Levaquin and Tessalon. The patient took her 1st dose around 5pm on Friday evening and noted some new-onset chills at bedtime around 7-8pm but otherwise denied any other new symptoms. She woke up on Saturday morning around 7am and noted her face felt swollen. When she looked in the mirror, she had lip swelling with "brown discoloration",  periocular edema, and perioral itching. She stopped taking the Tessalon capsules however she continued to take the Levaquin. She denied SOB, difficulty breathing, itchy throat, rash, hypersenstivity to touch, diarrhea, abdominal pain during this episode. She did not take anything to help with the swelling and it has significantly improved with time. She previously took Robitussin DM without issues.  Currently, her lips are more dry and "hard." She feels her lower lip is still slightly swollen however it is difficult to determine since she naturally has full lips. She continues to endorse cough, difficulty sleeping, and weakness (howeer improved).   Social History: patient denies smoking  ROS: All other systems reviewed and are negative.  Past Medical History Patient Active Problem List   Diagnosis Date Noted  . Angioedema 07/19/2014  . CAP (community acquired pneumonia) 07/16/2014  . Vertigo 06/28/2014  . URI (upper respiratory infection) 05/28/2014  . Insomnia 12/15/2012  . Essential hypertension, benign 04/10/2012  . SLE (systemic lupus erythematosus) 04/10/2012    Medications- reviewed and updated Current Outpatient Prescriptions  Medication Sig Dispense Refill  . amLODipine (NORVASC) 10 MG tablet  Take 1 tablet (10 mg total) by mouth daily. 90 tablet 1  . cetirizine (ZYRTEC) 10 MG tablet Take 1 tablet (10 mg total) by mouth daily. 30 tablet 11  . guaiFENesin-dextromethorphan (ROBITUSSIN DM) 100-10 MG/5ML syrup Take 5 mLs by mouth every 4 (four) hours as needed for cough. 118 mL 0  . hydrochlorothiazide (HYDRODIURIL) 12.5 MG tablet Take 1 tablet (12.5 mg total) by mouth daily. 30 tablet 0  . ibuprofen (ADVIL,MOTRIN) 600 MG tablet Take 1 tablet (600 mg total) by mouth every 8 (eight) hours as needed. 90 tablet 0  . levofloxacin (LEVAQUIN) 750 MG tablet Take 1 tablet (750 mg total) by mouth daily. 5 tablet 0  . losartan (COZAAR) 100 MG tablet Take 1 tablet (100 mg total) by mouth daily. 90 tablet 0  . meclizine (ANTIVERT) 25 MG tablet Take 1 tablet (25 mg total) by mouth 3 (three) times daily as needed for dizziness. 90 tablet 1  . Multiple Vitamin (MULTIVITAMIN WITH MINERALS) TABS Take 1 tablet by mouth daily.    . ondansetron (ZOFRAN) 4 MG tablet Take 1 tablet (4 mg total) by mouth every 8 (eight) hours as needed for nausea or vomiting. 20 tablet 0  . potassium chloride (K-DUR) 10 MEQ tablet Take 1 tablet (10 mEq total) by mouth daily. 90 tablet 1   No current facility-administered medications for this visit.    Objective: Office vital signs reviewed. BP 142/88 mmHg  Pulse 87  Temp(Src) 98.3 F (36.8 C) (Oral)  Ht 5\' 4"  (1.626 m)  Wt 231 lb 3 oz (104.866 kg)  BMI 39.66 kg/m2   Physical Examination:  General: Awake, alert, well- nourished,  NAD ENMT:  TMs intact, normal light reflex, no erythema, no bulging. Nasal turbinates moist. Full lips, will slightly disproportionately larger lower lip. MMM, Oropharynx clear without erythema or tonsillar hypertrophy.  Eyes: Conjunctiva non-injected. PERRL.  Cardio: RRR, no m/r/g noted.  Pulm: No increased WOB.  CTAB, without wheezes, rhonchi or crackles noted.  GI: soft, NT/ND,+BS x4, no hepatomegaly, no splenomegaly Skin: No rashes  noted  Assessment/Plan: Angioedema Patient presenting with signs/symptoms of angioedema, less concerning for anaphylaxis the lip swelling and eye swelling were isolated symptoms. Given she removed Tessalon and her symptoms were improve,d this was most likely the culprit. The patient is on an ACE-inhibitor currently. - Discontinue Tessalon - Discussed reasons to take Benadryl and seek medical assistance: new onset lip swelling, eye swelling, difficult breathing, SOB, throat itching, hives and patient voiced understanding. - Given h/o angioedema, I have discontinued lisinopril-HCTZ and started losartan and HCTZ.   - Discussed with Dr. Deirdre Priest   Essential hypertension, benign Given h/o angioedema, lisinopril-HCTZ was discontinued due to concerns for future angioedema.  - Started losartan 100mg   QD - Started HCTZ 12.5mg  QD - Patient to f/u in 2weeks  - If BPs remain stable, could consider combination pill.      No orders of the defined types were placed in this encounter.    Meds ordered this encounter  Medications  . losartan (COZAAR) 100 MG tablet    Sig: Take 1 tablet (100 mg total) by mouth daily.    Dispense:  90 tablet    Refill:  0  . hydrochlorothiazide (HYDRODIURIL) 12.5 MG tablet    Sig: Take 1 tablet (12.5 mg total) by mouth daily.    Dispense:  30 tablet    Refill:  0    Joanna Puff PGY-1, [facility]

## 2014-07-19 NOTE — Assessment & Plan Note (Signed)
Given h/o angioedema, lisinopril-HCTZ was discontinued due to concerns for future angioedema.  - Started losartan 100mg   QD - Started HCTZ 12.5mg  QD - Patient to f/u in 2weeks  - If BPs remain stable, could consider combination pill.

## 2014-07-19 NOTE — Telephone Encounter (Signed)
Spoke with patient she complains of lip swelling and chills since starting medications on Saturday. Patient states she stopped taking the tessalon with some improvement but has continued with the levaquin, patient states she hasnt had any SOB. Same day appointment scheduled for this morning.

## 2014-07-19 NOTE — Patient Instructions (Addendum)
It was nice to meet you. Please DO NOT continue to take the Tessalon (the cough medication). You can continue to take Robitussin DM for cough, however coughing is good during this time. Please stop taking the lisinopril-hydrochlorothiazide pill. I have replaced this with 2 pills: Losartan 100mg  and hydrochlorothiazide 12.5mg : if you do well with this, then this comes in a combination pill. Please follow up in 2 weeks so we can check your blood pressure given this change.  Angioedema Angioedema is a sudden swelling of tissues, often of the skin. It can occur on the face or genitals or in the abdomen or other body parts. The swelling usually develops over a short period and gets better in 24 to 48 hours. It often begins during the night and is found when the person wakes up. The person may also get red, itchy patches of skin (hives). Angioedema can be dangerous if it involves swelling of the air passages.  Depending on the cause, episodes of angioedema may only happen once, come back in unpredictable patterns, or repeat for several years and then gradually fade away.  CAUSES  Angioedema can be caused by an allergic reaction to various triggers. It can also result from nonallergic causes, including reactions to drugs, immune system disorders, viral infections, or an abnormal gene that is passed to you from your parents (hereditary). For some people with angioedema, the cause is unknown.  Some things that can trigger angioedema include:   Foods.   Medicines, such as ACE inhibitors, ARBs, nonsteroidal anti-inflammatory agents, or estrogen.   Latex.   Animal saliva.   Insect stings.   Dyes used in X-rays.   Mild injury.   Dental work.  Surgery.  Stress.   Sudden changes in temperature.   Exercise. SIGNS AND SYMPTOMS   Swelling of the skin.  Hives. If these are present, there is also intense itching.  Redness in the affected area.   Pain in the affected  area.  Swollen lips or tongue.  Breathing problems. This may happen if the air passages swell.  Wheezing. If internal organs are involved, there may be:   Nausea.   Abdominal pain.   Vomiting.   Difficulty swallowing.   Difficulty passing urine. DIAGNOSIS   Your health care provider will examine the affected area and take a medical and family history.  Various tests may be done to help determine the cause. Tests may include:  Allergy skin tests to see if the problem is an allergic reaction.   Blood tests to check for hereditary angioedema.   Tests to check for underlying diseases that could cause the condition.   A review of your medicines, including over-the-counter medicines, may be done. TREATMENT  Treatment will depend on the cause of the angioedema. Possible treatments include:   Removal of anything that triggered the condition (such as stopping certain medicines).   Medicines to treat symptoms or prevent attacks. Medicines given may include:   Antihistamines.   Epinephrine injection.   Steroids.   Hospitalization may be required for severe attacks. If the air passages are affected, it can be an emergency. Tubes may need to be placed to keep the airway open. HOME CARE INSTRUCTIONS   Take all medicines as directed by your health care provider.  If you were given medicines for emergency allergy treatment, always carry them with you.  Wear a medical bracelet as directed by your health care provider.   Avoid known triggers. SEEK MEDICAL CARE IF:   You have  repeat attacks of angioedema.   Your attacks are more frequent or more severe despite preventive measures.   You have hereditary angioedema and are considering having children. It is important to discuss with your health care provider the risks of passing the condition on to your children. SEEK IMMEDIATE MEDICAL CARE IF:   You have severe swelling of the mouth, tongue, or lips.  You  have difficulty breathing.   You have difficulty swallowing.   You faint. MAKE SURE YOU:  Understand these instructions.  Will watch your condition.  Will get help right away if you are not doing well or get worse. Document Released: 06/25/2001 Document Revised: 08/31/2013 Document Reviewed: 12/08/2012 [facility] Patient Information 2015 [place], [place]. This information is not intended to replace advice given to you by your health care provider. Make sure you discuss any questions you have with your health care provider.

## 2014-07-19 NOTE — Assessment & Plan Note (Addendum)
Patient presenting with signs/symptoms of angioedema, less concerning for anaphylaxis the lip swelling and eye swelling were isolated symptoms. Given she removed Tessalon and her symptoms were improve,d this was most likely the culprit. The patient is on an ACE-inhibitor currently. - Discontinue Tessalon - Discussed reasons to take Benadryl and seek medical assistance: new onset lip swelling, eye swelling, difficult breathing, SOB, throat itching, hives and patient voiced understanding. - Given h/o angioedema, I have discontinued lisinopril-HCTZ and started losartan and HCTZ.   - Discussed with Dr. Deirdre Priest

## 2014-08-03 ENCOUNTER — Ambulatory Visit (HOSPITAL_COMMUNITY)
Admission: RE | Admit: 2014-08-03 | Discharge: 2014-08-03 | Disposition: A | Discharge status: Home / Self Care | Payer: No Typology Code available for payment source | Source: Ambulatory Visit | Attending: Family Medicine | Admitting: Family Medicine

## 2014-08-03 ENCOUNTER — Ambulatory Visit
Admission: RE | Admit: 2014-08-03 | Discharge: 2014-08-03 | Disposition: A | Discharge status: Home / Self Care | Payer: No Typology Code available for payment source | Source: Ambulatory Visit | Attending: Family Medicine | Admitting: Family Medicine

## 2014-08-03 ENCOUNTER — Ambulatory Visit: Payer: No Typology Code available for payment source | Admitting: Family Medicine

## 2014-08-03 ENCOUNTER — Encounter: Payer: Self-pay | Admitting: Family Medicine

## 2014-08-03 VITALS — BP 135/84 | HR 73 | Temp 98.0°F | Ht 64.0 in | Wt 237.3 lb

## 2014-08-03 DIAGNOSIS — R079 Chest pain, unspecified: Secondary | ICD-10-CM

## 2014-08-03 DIAGNOSIS — R9431 Abnormal electrocardiogram [ECG] [EKG]: Secondary | ICD-10-CM | POA: Diagnosis not present

## 2014-08-03 DIAGNOSIS — J189 Pneumonia, unspecified organism: Secondary | ICD-10-CM

## 2014-08-03 MED ORDER — PREDNISONE 50 MG PO TABS: 50.0000 mg | ORAL_TABLET | Freq: Every day | ORAL | Status: DC

## 2014-08-03 NOTE — Progress Notes (Signed)
Patient ID: Veronica [name], female   DOB: May 19, 1960, 54 y.o.   MRN: [id number]  HPI:  Pt presents for a same day appointment to discuss recheck of pneumonia.   Reports she took a course of Levaquin. Still is coughing. No recurrent fever. She had angioedema after starting Tessalon and so stopped this. Denies any leg swelling. Still is quite hoarse from coughing. Has newly developed chest discomfort on the left side since Thursday. It is worse with inspiration and lying down. It does not radiate to her arm or neck. Not associated with nausea or sweating. Not worse with exertion. Denies shortness of breath. Has not noticed tenderness over this area. Tylenol has not helped. Concerned she could be developing lupus flare, which usually results in her developing fluid in her lungs.    History of lupus, followed by Dr. Kellie Simmering. Last saw 2-3 months ago. Not on any medications for this. Denies having any flare since 2009.  ROS: See HPI  PMFSH: HTN, SLE, recent angioedema from tessalon  PHYSICAL EXAM: BP 135/84 mmHg  Pulse 73  Temp(Src) 98 F (36.7 C) (Oral)  Ht 5\' 4"  (1.626 m)  Wt 237 lb 4.8 oz (107.639 kg)  BMI 40.71 kg/m2  O2 sat remained over 96% while ambulating on room air Gen: NAD HEENT: NCAT Heart: RRR no murmur Lungs: CTAB NWOB, no focal crackles or wheezes Neuro: grossly nonfocal speech normal Ext: No appreciable lower extremity edema bilaterally Chest: L chest wall nontender  ASSESSMENT/PLAN:  1. Recheck pneumonia: Persistent cough but no fever. Her pulmonary exam is clear today. No hypoxia with ambulation. Suspect she just has persistent cough. I will recheck a chest x-ray to evaluate for resolution of her pneumonia, also to check for pulmonary edema or pleural effusion given her history of lupus. I suspect her left-sided chest pain is noncardiac in nature. It is certainly atypical in history. Her EKG is without any signs of ischemia today. I will treat her with prednisone 50 mg  for 5 days. Suspect this will help her cough, left-sided chest pain, and may also stay off any developing lupus flare. She is instructed to follow-up with her PCP if she is not improving after the prednisone.  FOLLOW UP: F/u as needed if symptoms worsen or do not improve.   J. [place], MD [facility]

## 2014-08-03 NOTE — Patient Instructions (Signed)
Take prednisone 50 mg daily for 5 days. Go get a new chest x-ray I will call or send you a letter with results  If not improving with prednisone please come in and follow up with your primary doctor Dr. Jordan Likes.  Be well, Dr. Pollie Meyer

## 2014-08-04 ENCOUNTER — Telehealth: Payer: Self-pay | Admitting: Family Medicine

## 2014-08-04 ENCOUNTER — Encounter: Payer: Self-pay | Admitting: Family Medicine

## 2014-08-04 NOTE — Telephone Encounter (Signed)
Please leave result msg on home phone number left.  Will retrieve from work.

## 2014-08-05 NOTE — Telephone Encounter (Signed)
Babs, just to clarify because I wasn't sure. This patient wants me to leave detailed VM on her home phone number with results?  Thanks, [place]

## 2014-08-06 NOTE — Telephone Encounter (Signed)
Called back about results °

## 2014-08-11 ENCOUNTER — Encounter: Payer: Self-pay | Admitting: Family Medicine

## 2014-08-11 ENCOUNTER — Ambulatory Visit: Payer: No Typology Code available for payment source | Admitting: Family Medicine

## 2014-08-11 VITALS — BP 147/81 | HR 75 | Temp 98.4°F | Ht 64.0 in | Wt 234.4 lb

## 2014-08-11 DIAGNOSIS — R0789 Other chest pain: Secondary | ICD-10-CM | POA: Diagnosis not present

## 2014-08-11 MED ORDER — MELOXICAM 15 MG PO TABS: ORAL_TABLET | ORAL | Status: DC

## 2014-08-11 NOTE — Patient Instructions (Signed)
Thank you for coming in, today!  Your chest pain sounds like musculoskeletal pain (pain from muscles, bone, and cartilage). The popping you heard / felt was probably the cartilage in your ribs. I doubt very strongly you have a broken rib or anything like that.  I would recommend you take Mobic (meloxicam) once a day, every day, for 1 week. After that, you can take it once a day as needed. DO NOT take other "NSAID" medications with Mobic (ibuprofen, Aleve, etc). It's okay to take Tylenol with it, if you want. You should take all your other regular medicines, as well.  Come back to see Dr. Jordan Likes as you need. Please feel free to call with any questions or concerns at any time, at [phone]. --Dr. Casper Harrison

## 2014-08-11 NOTE — Progress Notes (Signed)
Subjective:    Patient ID: Veronica [name], female    DOB: [birth year], 54 y.o.   MRN: [id number]  HPI: Pt presents to clinic for SDA for left-sided chest pain. She reports the pain has been present for about 2 weeks; she felt a "pop" with bending over two days ago and today, under her breast, on the left side, which made the pain. The pain is described vaguely, but is not aching or sore, "like something is pushing on it." The pain is worse with coughing, sneezing, or moving. Tylenol has not helped. She denies radiation of pain or neurologic symptoms. She has some chronic SOB with walking, but also has some worse SOB when moving due to increased pain.  Of note pt was recently treated for pneumonia, and had a persistent cough for almost a month. She recently completed a course of Levaquin. She has also completed a course of prednisone as well (pt has hx of lupus). These current symptoms do not feel like her previous lupus flares, and she has not had one for several years. 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  Review of Systems: As above. Denies fevers / chills, N/V, abdominal pain. Cough is improved. Denies coryza-type symptoms.     Objective:   Physical Exam BP 147/81 mmHg  Pulse 75  Temp(Src) 98.4 F (36.9 C) (Oral)  Ht 5\' 4"  (1.626 m)  Wt 234 lb 6.4 oz (106.323 kg)  BMI 40.21 kg/m2 Gen: adult female in NAD HEENT: [place]/AT, EOMI, PERRLA, MMM, TM's clear bilaterally  Nasal mucosae and posterior oropharynx clear Neck: supple, normal ROM, no lymphadenopathy Cardio: RRR, no murmur appreciated Chest wall: tender to palpation along bilateral sternal borders, worse on the left, reproducing pain  Also tender along left inferior costal margin  No flail chest or frank instability to suggest compromise of bony structure of rib cage Pulm: CTAB, no wheezes, normal WOB Abd:  soft, nontender, BS+ Ext: warm, well-perfused, no LE edema     Assessment & Plan:  54yo female with likely MSK-related chest wall pain, exacerbated by recent PNA and persistent cough - defer CXR for now, given low suspicion for frank fracture and full resolution of PNA-related symptoms - Rx for Mobic for 1 week scheduled, then daily PRN (advised not to use other NSAIDs at the same time) - note for work given excusing her for the next week to help let pain / inflammation resolve; light-duty recommended for 1 week after that if any pain is persistent - reviewed red flags that would prompt presentation to the ED (frank substernal pressure, SOB, diaphoresis, etc) - f/u with PCP Dr. Jordan Likes otherwise as needed  Note FYI to Dr. Rayburn Felt, MD PGY-3, [facility] 08/11/2014, 1:23 PM

## 2014-08-11 NOTE — Telephone Encounter (Signed)
Called patient. Evidently she got a copy of the results in the mail.

## 2014-08-12 ENCOUNTER — Ambulatory Visit: Payer: No Typology Code available for payment source | Admitting: Family Medicine

## 2014-08-27 ENCOUNTER — Ambulatory Visit: Payer: No Typology Code available for payment source | Admitting: Family Medicine

## 2014-09-12 ENCOUNTER — Other Ambulatory Visit: Payer: Self-pay | Admitting: Family Medicine

## 2014-09-14 ENCOUNTER — Other Ambulatory Visit: Payer: Self-pay | Admitting: Family Medicine

## 2014-09-25 ENCOUNTER — Other Ambulatory Visit: Payer: Self-pay | Admitting: Family Medicine

## 2014-09-28 ENCOUNTER — Telehealth: Payer: Self-pay | Admitting: Family Medicine

## 2014-09-28 DIAGNOSIS — I10 Essential (primary) hypertension: Secondary | ICD-10-CM

## 2014-09-28 NOTE — Telephone Encounter (Signed)
Veronica [name] is calling because she is concerned about this refill as she is currently out. She said that the request "should have been sent two weeks ago". Please let the patient know once it is refilled, thank you, Dorothey Baseman, ASA

## 2014-09-28 NOTE — Telephone Encounter (Signed)
Spoke with pt.  She states that "the reaction I had was from the combination of Lisinopril and the tessalon, not the lisinopril alone".  Pt states that she has never started the cozaar.  Will forward to MD. Milas Gain, Maryjo Rochester

## 2014-09-28 NOTE — Telephone Encounter (Signed)
Left VM. If she calls back please have her ask for a nurse/CMA. She is asking for a refill of her lisinopril but this was stopped as it was thought that she had an allergic reaction. She was started on losartan.  We will not continue the lisinopril.   If she has any questions then please take a phone number and best time and I will try to call her back.   Myra Rude, MD PGY-2, [facility] 09/28/2014, 1:31 PM

## 2014-09-28 NOTE — Telephone Encounter (Signed)
Pt called back. She is availabe by phone ASAP

## 2014-09-29 NOTE — Telephone Encounter (Signed)
Mrs. Honold's lisinopril was discont'd and need new rx for losartan and hctz-  Do not send these as a combo.  See Dr. Derek Mound note for visit of 07/19/14.

## 2014-09-29 NOTE — Telephone Encounter (Signed)
Patient needs to speak with RN, says she never has taken cozaar, doesn't know exactly what it is for. Needs to know something today b/c this medication is for her bp and she is worried since she doesn't have anymore left. Is at work until 3 pm, pt wants [place] to call [phone].

## 2014-09-30 MED ORDER — HYDROCHLOROTHIAZIDE 12.5 MG PO TABS: 12.5000 mg | ORAL_TABLET | Freq: Every day | ORAL | Status: DC

## 2014-09-30 MED ORDER — LOSARTAN POTASSIUM 100 MG PO TABS: 100.0000 mg | ORAL_TABLET | Freq: Every day | ORAL | Status: DC

## 2014-09-30 NOTE — Telephone Encounter (Signed)
Script sent for losartan and hctz.   Myra Rude, MD PGY-2, [facility] 09/30/2014, 8:45 AM

## 2014-10-08 ENCOUNTER — Encounter: Payer: Self-pay | Admitting: Family Medicine

## 2014-10-08 ENCOUNTER — Ambulatory Visit (INDEPENDENT_AMBULATORY_CARE_PROVIDER_SITE_OTHER): Payer: No Typology Code available for payment source | Admitting: Family Medicine

## 2014-10-08 VITALS — BP 148/95 | HR 86 | Temp 98.2°F | Ht 64.0 in | Wt 235.0 lb

## 2014-10-08 DIAGNOSIS — I10 Essential (primary) hypertension: Secondary | ICD-10-CM

## 2014-10-08 DIAGNOSIS — M7989 Other specified soft tissue disorders: Secondary | ICD-10-CM

## 2014-10-08 DIAGNOSIS — Z111 Encounter for screening for respiratory tuberculosis: Secondary | ICD-10-CM

## 2014-10-08 MED ORDER — HYDROCHLOROTHIAZIDE 25 MG PO TABS: 25.0000 mg | ORAL_TABLET | Freq: Every day | ORAL | Status: DC

## 2014-10-08 NOTE — Patient Instructions (Signed)
Thank you for coming in,   We will increase your HCTZ and stop your amlodipine.   Follow up with me in three months and we'll see how your blood pressure is doing.   Please bring all of your medications with you to each visit.    Please feel free to call with any questions or concerns at any time, at [phone]. --Dr. Jordan Likes

## 2014-10-08 NOTE — Progress Notes (Signed)
    Subjective   Veronica [name] is a 54 y.o. female that presents for a same day visit  Leg swelling: starte since taking the amlodipine. She has leg swelling in both legs. She denies any shortness of breath. She has had recent changes in her blood pressure medication as she was having some reaction to lisinopril so it was stopped.   History  Substance Use Topics  . Smoking status: Never Smoker   . Smokeless tobacco: Never Used  . Alcohol Use: No    ROS Per HPI  Objective   BP 148/95 mmHg  Pulse 86  Temp(Src) 98.2 F (36.8 C) (Oral)  Ht 5\' 4"  (1.626 m)  Wt 235 lb (106.595 kg)  BMI 40.32 kg/m2  General: NAD, alert, cooperative with exam, well-appearing Respiratory/Chest: CTABL, no wheezes, non-labored Cardiovascular: RRR, good S1/S2, no murmur, +1 pitting edema, capillary refill brisk    Assessment and Plan   Please refer to problem based charting of assessment and plan

## 2014-10-09 LAB — BASIC METABOLIC PANEL WITH GFR
BUN: 14 mg/dL (ref 6–23)
CO2: 26 mEq/L (ref 19–32)
Calcium: 9.4 mg/dL (ref 8.4–10.5)
Chloride: 100 mEq/L (ref 96–112)
Creat: 0.81 mg/dL (ref 0.50–1.10)
GFR, Est African American: >89 mL/min
GFR, Est Non African American: 84 mL/min
Glucose, Bld: 127 mg/dL — ABNORMAL HIGH (ref 70–99)
Potassium: 3.5 mEq/L (ref 3.5–5.3)
Sodium: 139 mEq/L (ref 135–145)

## 2014-10-10 ENCOUNTER — Encounter: Payer: Self-pay | Admitting: Family Medicine

## 2014-10-10 DIAGNOSIS — M7989 Other specified soft tissue disorders: Secondary | ICD-10-CM | POA: Insufficient documentation

## 2014-10-10 NOTE — Assessment & Plan Note (Signed)
Having leg swelling since the amlodipine was started  Will stop amlodipine  - increase HCTZ  - BMP today  - f/u in one month. May need to add BB if BP doesn't improve.

## 2014-10-11 ENCOUNTER — Ambulatory Visit (INDEPENDENT_AMBULATORY_CARE_PROVIDER_SITE_OTHER): Payer: No Typology Code available for payment source | Admitting: *Deleted

## 2014-10-11 ENCOUNTER — Encounter: Payer: Self-pay | Admitting: Family Medicine

## 2014-10-11 ENCOUNTER — Encounter: Payer: Self-pay | Admitting: *Deleted

## 2014-10-11 DIAGNOSIS — Z7689 Persons encountering health services in other specified circumstances: Secondary | ICD-10-CM

## 2014-10-11 DIAGNOSIS — Z111 Encounter for screening for respiratory tuberculosis: Secondary | ICD-10-CM

## 2014-10-11 LAB — TB SKIN TEST
Induration: 0 mm
TB Skin Test: NEGATIVE

## 2014-10-11 NOTE — Progress Notes (Signed)
   PPD Reading Note PPD read and results entered in EpicCare. Result: 0 mm induration. Interpretation: Negative If test not read within 48-72 hours of initial placement, patient advised to repeat in other arm 1-3 weeks after this test. Allergic reaction: no  Iasha Mccalister L, RN  

## 2014-11-09 ENCOUNTER — Encounter: Payer: Self-pay | Admitting: Family Medicine

## 2014-11-09 ENCOUNTER — Ambulatory Visit (INDEPENDENT_AMBULATORY_CARE_PROVIDER_SITE_OTHER): Payer: No Typology Code available for payment source | Admitting: Family Medicine

## 2014-11-09 VITALS — BP 140/88 | HR 91 | Temp 98.2°F | Wt 238.0 lb

## 2014-11-09 DIAGNOSIS — R519 Headache, unspecified: Secondary | ICD-10-CM

## 2014-11-09 DIAGNOSIS — R51 Headache: Secondary | ICD-10-CM

## 2014-11-12 ENCOUNTER — Encounter: Payer: Self-pay | Admitting: Family Medicine

## 2014-11-12 DIAGNOSIS — R51 Headache: Secondary | ICD-10-CM

## 2014-11-12 DIAGNOSIS — R519 Headache, unspecified: Secondary | ICD-10-CM | POA: Insufficient documentation

## 2014-11-12 NOTE — Progress Notes (Signed)
   Subjective:    Patient ID: Veronica [name], female    DOB: 10/25/1962, 54 y.o.   MRN: [id number]  HPI  Veronica [name] is here for headache.  Headache started 14 days ago Pain is throbbing  Location: back of neck/head and radiates to the front  Medications tried: none  Head trauma: no Sudden onset: no Taking blood thinners: no History of cancer: no  Symptoms Nose congestion stuffiness:  no Nausea vomiting: no Photophobia: no Noise sensitivity: no Double vision or loss of vision: no Fever: no Neck Stiffness: no Trouble walking or speaking: no  PMH - Smoking status noted.      Health Maintenance:  Health Maintenance Due  Topic Date Due  . HIV Screening  01/17/1978  . PAP SMEAR  01/17/1981  . COLONOSCOPY  01/17/2013  . MAMMOGRAM  01/26/2014    -  reports that she has never smoked. She has never used smokeless tobacco. - Review of Systems: Per HPI. All other systems reviewed and are negative. - Past Medical History: Patient Active Problem List   Diagnosis Date Noted  . Leg swelling 10/10/2014  . Insomnia 12/15/2012  . Essential hypertension, benign 04/10/2012  . SLE (systemic lupus erythematosus) 04/10/2012   - Medications: reviewed and updated Current Outpatient Prescriptions  Medication Sig Dispense Refill  . amLODipine (NORVASC) 10 MG tablet Take 1 tablet (10 mg total) by mouth daily. 90 tablet 1  . cetirizine (ZYRTEC) 10 MG tablet Take 1 tablet (10 mg total) by mouth daily. 30 tablet 11  . guaiFENesin-dextromethorphan (ROBITUSSIN DM) 100-10 MG/5ML syrup Take 5 mLs by mouth every 4 (four) hours as needed for cough. 118 mL 0  . hydrochlorothiazide (HYDRODIURIL) 25 MG tablet Take 1 tablet (25 mg total) by mouth daily. 90 tablet 3  . ibuprofen (ADVIL,MOTRIN) 600 MG tablet Take 1 tablet (600 mg total) by mouth every 8 (eight) hours as needed. 90 tablet 0  . losartan (COZAAR) 100 MG tablet Take 1 tablet (100 mg total) by mouth daily. 30 tablet 3  . meclizine  (ANTIVERT) 25 MG tablet Take 1 tablet (25 mg total) by mouth 3 (three) times daily as needed for dizziness. 90 tablet 1  . meloxicam (MOBIC) 15 MG tablet Take one tablet every day for 1 week starting 08/11/14, then once daily as needed. 30 tablet 0  . Multiple Vitamin (MULTIVITAMIN WITH MINERALS) TABS Take 1 tablet by mouth daily.    . ondansetron (ZOFRAN) 4 MG tablet Take 1 tablet (4 mg total) by mouth every 8 (eight) hours as needed for nausea or vomiting. 20 tablet 0  . potassium chloride (K-DUR) 10 MEQ tablet Take 1 tablet (10 mEq total) by mouth daily. 90 tablet 1   No current facility-administered medications for this visit.     Review of Systems See HPI     Objective:   Physical Exam BP 140/88 mmHg  Pulse 91  Temp(Src) 98.2 F (36.8 C) (Oral)  Wt 238 lb (107.956 kg) Gen: NAD, alert, cooperative with exam, well-appearing HEENT: NCAT, clear conjunctiva, supple neck Skin: no rashes, normal turgor  Neuro: no gross deficits.  MSK: Trapezius is in spasms upon palpation    Assessment & Plan:

## 2014-11-12 NOTE — Assessment & Plan Note (Signed)
HA most likely tension type in nature. She has a large bosom complicating the tension in her trapezius.  - advised to roll a ball on her trapezius  - relaxation techniques  - wear a supportive brassiere

## 2014-11-29 ENCOUNTER — Ambulatory Visit (INDEPENDENT_AMBULATORY_CARE_PROVIDER_SITE_OTHER): Payer: No Typology Code available for payment source | Admitting: Family Medicine

## 2014-11-29 VITALS — BP 142/96 | HR 77 | Temp 98.2°F | Ht 64.0 in | Wt 237.7 lb

## 2014-11-29 DIAGNOSIS — R079 Chest pain, unspecified: Secondary | ICD-10-CM | POA: Insufficient documentation

## 2014-11-29 DIAGNOSIS — R0789 Other chest pain: Secondary | ICD-10-CM | POA: Diagnosis not present

## 2014-11-29 NOTE — Patient Instructions (Signed)
I highly recommend that you seek immediate medical attention if chest pain gets worse.  You develop shortness of breath, vision changes, nausea or vomiting.  Chest xray has been ordered.  Chest Pain (Nonspecific) It is often hard to give a specific diagnosis for the cause of chest pain. There is always a chance that your pain could be related to something serious, such as a heart attack or a blood clot in the lungs. You need to follow up with your health care provider for further evaluation. CAUSES   Heartburn.  Pneumonia or bronchitis.  Anxiety or stress.  Inflammation around your heart (pericarditis) or lung (pleuritis or pleurisy).  A blood clot in the lung.  A collapsed lung (pneumothorax). It can develop suddenly on its own (spontaneous pneumothorax) or from trauma to the chest.  Shingles infection (herpes zoster virus). The chest wall is composed of bones, muscles, and cartilage. Any of these can be the source of the pain.  The bones can be bruised by injury.  The muscles or cartilage can be strained by coughing or overwork.  The cartilage can be affected by inflammation and become sore (costochondritis). DIAGNOSIS  Lab tests or other studies may be needed to find the cause of your pain. Your health care provider may have you take a test called an ambulatory electrocardiogram (ECG). An ECG records your heartbeat patterns over a 24-hour period. You may also have other tests, such as:  Transthoracic echocardiogram (TTE). During echocardiography, sound waves are used to evaluate how blood flows through your heart.  Transesophageal echocardiogram (TEE).  Cardiac monitoring. This allows your health care provider to monitor your heart rate and rhythm in real time.  Holter monitor. This is a portable device that records your heartbeat and can help diagnose heart arrhythmias. It allows your health care provider to track your heart activity for several days, if needed.  Stress tests  by exercise or by giving medicine that makes the heart beat faster. TREATMENT   Treatment depends on what may be causing your chest pain. Treatment may include:  Acid blockers for heartburn.  Anti-inflammatory medicine.  Pain medicine for inflammatory conditions.  Antibiotics if an infection is present.  You may be advised to change lifestyle habits. This includes stopping smoking and avoiding alcohol, caffeine, and chocolate.  You may be advised to keep your head raised (elevated) when sleeping. This reduces the chance of acid going backward from your stomach into your esophagus. Most of the time, nonspecific chest pain will improve within 2-3 days with rest and mild pain medicine.  HOME CARE INSTRUCTIONS   If antibiotics were prescribed, take them as directed. Finish them even if you start to feel better.  For the next few days, avoid physical activities that bring on chest pain. Continue physical activities as directed.  Do not use any tobacco products, including cigarettes, chewing tobacco, or electronic cigarettes.  Avoid drinking alcohol.  Only take medicine as directed by your health care provider.  Follow your health care provider's suggestions for further testing if your chest pain does not go away.  Keep any follow-up appointments you made. If you do not go to an appointment, you could develop lasting (chronic) problems with pain. If there is any problem keeping an appointment, call to reschedule. SEEK MEDICAL CARE IF:   Your chest pain does not go away, even after treatment.  You have a rash with blisters on your chest.  You have a fever. SEEK IMMEDIATE MEDICAL CARE IF:  You have increased chest pain or pain that spreads to your arm, neck, jaw, back, or abdomen.  You have shortness of breath.  You have an increasing cough, or you cough up blood.  You have severe back or abdominal pain.  You feel nauseous or vomit.  You have severe weakness.  You  faint.  You have chills. This is an emergency. Do not wait to see if the pain will go away. Get medical help at once. Call your local emergency services (911 in [place] Do not drive yourself to the hospital. MAKE SURE YOU:   Understand these instructions.  Will watch your condition.  Will get help right away if you are not doing well or get worse. Document Released: 01/24/2005 Document Revised: 04/21/2013 Document Reviewed: 11/20/2007 [facility] Patient Information 2015 [place], [place]. This information is not intended to replace advice given to you by your health care provider. Make sure you discuss any questions you have with your health care provider.

## 2014-11-29 NOTE — Progress Notes (Signed)
Patient ID: Veronica [name], female   DOB: 1960/05/15, 54 y.o.   MRN: [id number]    Subjective: CC: L sided chest pain HPI: Patient is a 54 y.o. female presenting to clinic today for same day appointment. Concerns today include:  1. L sided chest pain Started about 2 weeks ago.  Patient reports that she was lying in the bed when it started.  Pain is described as pressure that is constant.  It does not radiate.  It stays underneath her L breast.  Feels like when had pneumonia.  Denies SOB.  Endorses cough.  Cough has been productive.  Started 3 weeks ago.  Denies fevers.  Reports discomfort when eating.  No belching or h/o acid reflux.  Chest pain is relieved by hot showers.  Tylenol and Motrin also help pain.  Pain is worse when bending over.  Upper body activity also makes it worse.  Pain is 5/10.  Worse with deep breath.  Denies palpitations, nausea, vomiting.  Occ dizziness.  Social History Reviewed: non smoker. FamHx and MedHx updated.  Please see EMR.  ROS: All other systems reviewed and are negative.  Objective: Office vital signs reviewed. BP 142/96 mmHg  Pulse 77  Temp(Src) 98.2 F (36.8 C) (Oral)  Ht 5\' 4"  (1.626 m)  Wt 237 lb 11.2 oz (107.82 kg)  BMI 40.78 kg/m2  Physical Examination:  General: Awake, alert, obese, non-diaphoretic, NAD HEENT: Normal, EOMI Cardio: RRR, S1S2 heard, no murmurs appreciated, +2 radial pulses Pulm: CTAB, no wheezes, rhonchi or rales, no increased WOB MSK: Normal gait and station Skin: dry, intact, no rashes or lesions Neuro: follows commands, no focal deficits  Assessment: 55 y.o. female with L sided chest pain  Plan: See Problem List and After Visit Summary   46, DO PGY-2, [facility]

## 2014-11-29 NOTE — Assessment & Plan Note (Signed)
Considered cardiac etiology.  EKG ordered but patient refused.  Patient does not appear infected.  No focal findings on exam.  Requested CXR.  This was ordered.  Patient asking for work note to excuse her from work for the next week.  We discussed that I did not think this was appropriate, as we were not currently treating anything.   -Work note for today's visit given.  Excuse for the next week deferred to employer's discretion.  -Follow up CXR.  Plan pending findings -Discussed that patient is to seek immediate medical attention if CP progresses, she develops SOB, diaphoresis, nausea, vomiting, vision changes.  Patient voiced good understanding. -Return PRN  -Discussed patient's case with Dr Deirdre Priest.

## 2014-11-30 ENCOUNTER — Telehealth: Payer: Self-pay | Admitting: Family Medicine

## 2014-11-30 ENCOUNTER — Ambulatory Visit
Admission: RE | Admit: 2014-11-30 | Discharge: 2014-11-30 | Disposition: A | Discharge status: Home / Self Care | Payer: No Typology Code available for payment source | Source: Ambulatory Visit | Attending: Family Medicine | Admitting: Family Medicine

## 2014-11-30 DIAGNOSIS — R0789 Other chest pain: Secondary | ICD-10-CM

## 2014-11-30 DIAGNOSIS — R079 Chest pain, unspecified: Secondary | ICD-10-CM

## 2014-11-30 NOTE — Telephone Encounter (Signed)
Called and LVM to return call re: CXR.  Looks like patient has a questionable mild infiltrate in the RML.  Patient's complaint was L sided.  No infectious symptoms or focal exam findings at her appt yesterday.  Since this was seen on prior Xray, would advise patient to follow up with PCP if symptoms are worsening/develops fevers/ SOB/worsening cough.  Her PCP might consider a CT chest if no resolution of symptoms.  Syretta Kochel M. Nadine Counts, DO PGY-2, [facility]

## 2014-11-30 NOTE — Progress Notes (Signed)
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   

## 2014-12-01 ENCOUNTER — Ambulatory Visit (INDEPENDENT_AMBULATORY_CARE_PROVIDER_SITE_OTHER): Payer: No Typology Code available for payment source | Admitting: Family Medicine

## 2014-12-01 VITALS — BP 168/110 | HR 69 | Temp 98.4°F | Ht 64.0 in | Wt 236.6 lb

## 2014-12-01 DIAGNOSIS — G44209 Tension-type headache, unspecified, not intractable: Secondary | ICD-10-CM | POA: Diagnosis not present

## 2014-12-01 DIAGNOSIS — I10 Essential (primary) hypertension: Secondary | ICD-10-CM

## 2014-12-01 DIAGNOSIS — R938 Abnormal findings on diagnostic imaging of other specified body structures: Secondary | ICD-10-CM

## 2014-12-01 DIAGNOSIS — R079 Chest pain, unspecified: Secondary | ICD-10-CM | POA: Diagnosis not present

## 2014-12-01 DIAGNOSIS — R9389 Abnormal findings on diagnostic imaging of other specified body structures: Secondary | ICD-10-CM

## 2014-12-01 MED ORDER — TRAMADOL HCL 50 MG PO TABS: 50.0000 mg | ORAL_TABLET | Freq: Three times a day (TID) | ORAL | Status: DC | PRN

## 2014-12-01 MED ORDER — CARVEDILOL 6.25 MG PO TABS: 6.2500 mg | ORAL_TABLET | Freq: Two times a day (BID) | ORAL | Status: DC

## 2014-12-01 NOTE — Progress Notes (Signed)
Subjective:    Veronica [name] - 54 y.o. female MRN [id number]  Date of birth: 12/05/1960  HPI  Veronica [name] is here for HA and HTN.   Headache started years ago but getting worse recently.  Pain is throbbing and excruciating  Severity:  10/10  Location: fronto temporal  Medications tried: tyneol and ibuprofen  Head trauma: syncope in 2010 and hit head on sink. Normal CT scan in 2010 Sudden onset: no Previous similar headaches: yes  Taking blood thinners: no History of cancer: no  Symptoms Nose congestion stuffiness:  no Nausea vomiting: no Photophobia: no Noise sensitivity: no Double vision or loss of vision: no Fever: no Neck Stiffness: no Trouble walking or speaking: no  HTN Disease Monitoring: Home BP Monitoring no  Medications: amlodipine, HCTZ, Losartan  Chest pain- yes,  Occuring on left side of chest. Worse with lying onit. Hot shower improves pain. 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  Dyspnea- no Compliance-  Yes .  Lightheadedness-  no   Edema- no CXR showing middle lobe infiltrate. CXR in April showing middle lobe and recommended f/u scan if no resolution.   Chest pain: Is currently on a left-sided of her chest and worse when she is lying on it. 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 She denies any injury but has to do this repetitive motion on a daily basis while at work. She denies any shortness of breath on deep breathing. Pain is worse with palpation. She denies any nausea, vomiting, diaphoresis, radiation to her left shoulder or neck. Was just evaluated 11/29/14 and refused an EKG. Reports the pain is behind her breast.  CXR on 8/2 showing a right middle lobe infiltrate. She is treated for pneumonia in April 2016 with a chest x-ray showing at that time a right middle lobe infiltrate. The radiology recommendations at that time suggested a repeat x-ray and if continue to have  infiltrate then follow-up chest CT.  PMH - Smoking status noted.   Health Maintenance:  Health Maintenance Due  Topic Date Due  . HIV Screening  01/17/1978  . PAP SMEAR  01/17/1981  . COLONOSCOPY  01/17/2013  . MAMMOGRAM  01/26/2014  . INFLUENZA VACCINE  11/29/2014    -  reports that she has never smoked. She has never used smokeless tobacco. - Review of Systems: Per HPI. All other systems reviewed and are negative. - Past Medical History: Patient Active Problem List   Diagnosis Date Noted  . Chest pain 11/29/2014  . Headache 11/12/2014  . Leg swelling 10/10/2014  . Insomnia 12/15/2012  . Essential hypertension, benign 04/10/2012  . SLE (systemic lupus erythematosus) 04/10/2012   - Medications: reviewed and updated Current Outpatient Prescriptions  Medication Sig Dispense Refill  . amLODipine (NORVASC) 10 MG tablet Take 1 tablet (10 mg total) by mouth daily. 90 tablet 1  . cetirizine (ZYRTEC) 10 MG tablet Take 1 tablet (10 mg total) by mouth daily. 30 tablet 11  . guaiFENesin-dextromethorphan (ROBITUSSIN DM) 100-10 MG/5ML syrup Take 5 mLs by mouth every 4 (four) hours as needed for cough. 118 mL 0  . hydrochlorothiazide (HYDRODIURIL) 25 MG tablet Take 1 tablet (25 mg total) by mouth daily. 90 tablet 3  . ibuprofen (ADVIL,MOTRIN) 600 MG tablet Take 1 tablet (600 mg total) by mouth every 8 (eight) hours as needed. 90 tablet 0  . losartan (COZAAR) 100 MG tablet Take 1 tablet (  100 mg total) by mouth daily. 30 tablet 3  . meclizine (ANTIVERT) 25 MG tablet Take 1 tablet (25 mg total) by mouth 3 (three) times daily as needed for dizziness. 90 tablet 1  . meloxicam (MOBIC) 15 MG tablet Take one tablet every day for 1 week starting 08/11/14, then once daily as needed. 30 tablet 0  . Multiple Vitamin (MULTIVITAMIN WITH MINERALS) TABS Take 1 tablet by mouth daily.    . ondansetron (ZOFRAN) 4 MG tablet Take 1 tablet (4 mg total) by mouth every 8 (eight) hours as needed for nausea or vomiting.  20 tablet 0  . potassium chloride (K-DUR) 10 MEQ tablet Take 1 tablet (10 mEq total) by mouth daily. 90 tablet 1   No current facility-administered medications for this visit.   Review of Systems See HPI     Objective:   Physical Exam BP 168/100 mmHg  Pulse 69  Temp(Src) 98.4 F (36.9 C) (Oral)  Ht 5\' 4"  (1.626 m)  Wt 236 lb 9.6 oz (107.321 kg)  BMI 40.59 kg/m2 Gen: NAD, alert, cooperative with exam CV: RRR, good S1/S2, no murmur, no edema Chest: TTP midclavicular line of roughly rib 3-6 or 7, macromastia  Resp: CTABL, no wheezes, non-labored Skin: no rashes, normal turgor  Neuro: Cranial nerves II through XII intact, normal gait Psych: alert and oriented     Assessment & Plan:

## 2014-12-01 NOTE — Assessment & Plan Note (Signed)
Chest pain most likely MSK in nature. Doubtful for cardiac with reproduction on exam and worsening with movement and lifting.  CXR showing infiltrate from 8/02. She reports feeling the same way today as she did when she was treated for PNA in April. Does report some phlegm production.  CXR reading in April recommended a f/u CT scan is continued infiltrate on f/u CXR  - Chest CT w/ and w/out  - tramadol #30 for pain. Notified that this is not a long term medication.   - note given to be out of work until Monday and light duty for the next month. No lifting greater than 10 pounds.

## 2014-12-01 NOTE — Assessment & Plan Note (Signed)
Most likely it's tension in nature. Maybe migrainous versus rebound. Doubtful for [facility] as no thunderclap and has hx of. Denies any visual changes and no CN deficits.  Possible to be related to uncontrolled BP  Advised to f/u with Dr. Raymondo Band for 24 hour bp monitoring and journal when HA occur.  - tramadol given to help with pain  - she will f/u in two weeks.

## 2014-12-01 NOTE — Patient Instructions (Signed)
Thank you for coming in,   Please let me know how you are feeling with the new medication.  Start Coreg  Continue losartan and hydrochlorothiazide  STOP amlodipine   I will call with the results of the CT scan.   Please follow up with me in two weeks for your headache and blood pressure.   Please make an appointment with Dr. Raymondo Band 24 hr blood pressure monitoring.   Please bring all of your medications with you to each visit.    Please feel free to call with any questions or concerns at any time, at [phone]. --Dr. Jordan Likes

## 2014-12-01 NOTE — Assessment & Plan Note (Addendum)
Uncontrolled. Continues to be elevated despite treatment Unsure if causing her headaches - Referral to Dr. Raymondo Band for 24-hour blood pressure monitoring. She will make note of when headaches are occurring [place] if related to blood pressure - Added Coreg 6.25 mg twice a day today. If HR doesn't tolerate may need to try hydralazine or clonidine. Propranolol may work but again if HR tolerates and may help with HA. Cleda Daub may be another choice.  - Continue losartan hydrochlorothiazide. She is not taking amlodipine - she will follow-up 2 weeks

## 2014-12-03 ENCOUNTER — Ambulatory Visit (HOSPITAL_COMMUNITY): Payer: No Typology Code available for payment source

## 2014-12-03 ENCOUNTER — Telehealth: Payer: Self-pay | Admitting: Family Medicine

## 2014-12-03 NOTE — Telephone Encounter (Signed)
Pam calling from pre-service center to obtain a prior authorization for pt's appt this morning. She initiialy asked for [facility]. Please contact at earliest convenience or the pt will be unable to proceed with appt. [phone]. Sadie Reynolds, ASA

## 2014-12-03 NOTE — Telephone Encounter (Signed)
Spoke to [facility]. Appt had to be rescheduled for Aug 11th. Aldona Lento

## 2014-12-09 ENCOUNTER — Ambulatory Visit (HOSPITAL_COMMUNITY)
Admission: RE | Admit: 2014-12-09 | Discharge: 2014-12-09 | Disposition: A | Discharge status: Home / Self Care | Payer: No Typology Code available for payment source | Source: Ambulatory Visit | Attending: Family Medicine | Admitting: Family Medicine

## 2014-12-09 ENCOUNTER — Encounter (HOSPITAL_COMMUNITY): Payer: Self-pay

## 2014-12-09 DIAGNOSIS — R938 Abnormal findings on diagnostic imaging of other specified body structures: Secondary | ICD-10-CM | POA: Insufficient documentation

## 2014-12-09 DIAGNOSIS — R079 Chest pain, unspecified: Secondary | ICD-10-CM

## 2014-12-09 DIAGNOSIS — R9389 Abnormal findings on diagnostic imaging of other specified body structures: Secondary | ICD-10-CM

## 2014-12-09 MED ORDER — IOHEXOL 300 MG/ML  SOLN
75.0000 mL | Freq: Once | INTRAMUSCULAR | Status: AC | PRN
  Administered 2014-12-09: 75 mL via INTRAVENOUS

## 2014-12-13 ENCOUNTER — Telehealth: Payer: Self-pay | Admitting: Family Medicine

## 2014-12-13 NOTE — Telephone Encounter (Signed)
Spoke with patient about her CT results.   Myra Rude, MD PGY-3, [facility] 12/13/2014, 8:17 PM

## 2014-12-30 ENCOUNTER — Ambulatory Visit: Payer: No Typology Code available for payment source | Admitting: Family Medicine

## 2015-01-04 ENCOUNTER — Encounter (HOSPITAL_COMMUNITY): Payer: Self-pay | Admitting: Emergency Medicine

## 2015-01-04 ENCOUNTER — Emergency Department (INDEPENDENT_AMBULATORY_CARE_PROVIDER_SITE_OTHER)
Admission: EM | Admit: 2015-01-04 | Discharge: 2015-01-04 | Disposition: A | Discharge status: Home / Self Care | Payer: No Typology Code available for payment source | Source: Home / Self Care | Attending: Family Medicine | Admitting: Family Medicine

## 2015-01-04 DIAGNOSIS — R42 Dizziness and giddiness: Secondary | ICD-10-CM

## 2015-01-04 DIAGNOSIS — Z658 Other specified problems related to psychosocial circumstances: Secondary | ICD-10-CM

## 2015-01-04 DIAGNOSIS — F439 Reaction to severe stress, unspecified: Secondary | ICD-10-CM

## 2015-01-04 MED ORDER — CITALOPRAM HYDROBROMIDE 10 MG PO TABS: 10.0000 mg | ORAL_TABLET | Freq: Every day | ORAL | Status: DC

## 2015-01-04 NOTE — Discharge Instructions (Signed)
Please make an appointment with the family practice center in the next 3 weeks to review your progress. I will you to stop taking cyclobenzaprine and stopped taking meclizine. Instead we've added one other medicine which should help with stress and blood pressure control.

## 2015-01-04 NOTE — ED Provider Notes (Signed)
CSN: [id number]     Arrival date & time 01/04/15  1849 History   First MD Initiated Contact with Patient 01/04/15 1952     Chief Complaint  Patient presents with  . Dizziness   (Consider location/radiation/quality/duration/timing/severity/associated sxs/prior Treatment) HPI Comments: This is a 54 year old married woman with 2 boys age 20 and 55 he's had 3 weeks of dizziness, becoming constant over the last 2 weeks. There is no orthostatic nature to this dizziness. She's had no change in medication other than adding carvedilol over the last month because her blood pressures been high.  Patient also takes a muscle relaxant which she says makes her drowsy.  Patient's been under quite a bit of stress over the last month because she has one car to get to work, her husband work, and get her 2 boys to work. She's not sleeping well.  Patient is a 54 y.o. female presenting with dizziness. The history is provided by the patient.  Dizziness Associated symptoms: headaches     Past Medical History  Diagnosis Date  . Hypertension   . SLE (systemic lupus erythematosus) 1998  . Headache(784.0)   . Hx of dizziness   . Arthritis   . History of blood transfusion    Past Surgical History  Procedure Laterality Date  . Abdominal hysterectomy  09/2008  . Cesarean section  1992, 1995  . Cholecystectomy N/A 07/18/2012    Procedure: LAPAROSCOPIC CHOLECYSTECTOMY WITH INTRAOPERATIVE CHOLANGIOGRAM;  Surgeon: Atilano Ina, MD;  Location: [facility] OR;  Service: General;  Laterality: N/A;  . Ercp N/A 07/29/2012    Procedure: ENDOSCOPIC RETROGRADE CHOLANGIOPANCREATOGRAPHY (ERCP);  Surgeon: Theda Belfast, MD;  Location: [facility];  Service: Endoscopy;  Laterality: N/A;  Dr. Elnoria Howard said he would start this PT arond 1330( AW)   Family History  Problem Relation Age of Onset  . Diabetes Mother   . Multiple sclerosis Sister   . Hypertension Father   . Hypertension Brother    Social History  Substance Use Topics  .  Smoking status: Never Smoker   . Smokeless tobacco: Never Used  . Alcohol Use: No   OB History    No data available     Review of Systems  Constitutional: Negative.   Eyes: Negative.   Respiratory: Negative.   Cardiovascular: Negative.   Neurological: Positive for dizziness, light-headedness and headaches.    Allergies  Tessalon  Home Medications   Prior to Admission medications   Medication Sig Start Date End Date Taking? Authorizing Provider  carvedilol (COREG) 6.25 MG tablet Take 1 tablet (6.25 mg total) by mouth 2 (two) times daily with a meal. 12/01/14   Myra Rude, MD  cetirizine (ZYRTEC) 10 MG tablet Take 1 tablet (10 mg total) by mouth daily. 05/27/14   Myra Rude, MD  guaiFENesin-dextromethorphan (ROBITUSSIN DM) 100-10 MG/5ML syrup Take 5 mLs by mouth every 4 (four) hours as needed for cough. 05/27/14   Myra Rude, MD  hydrochlorothiazide (HYDRODIURIL) 25 MG tablet Take 1 tablet (25 mg total) by mouth daily. 10/08/14   Myra Rude, MD  ibuprofen (ADVIL,MOTRIN) 600 MG tablet Take 1 tablet (600 mg total) by mouth every 8 (eight) hours as needed. 11/03/13   Myra Rude, MD  losartan (COZAAR) 100 MG tablet Take 1 tablet (100 mg total) by mouth daily. 09/30/14   Myra Rude, MD  meclizine (ANTIVERT) 25 MG tablet Take 1 tablet (25 mg total) by mouth 3 (three) times daily as needed for dizziness.  06/28/14   Stephanie Coup Street, MD  meloxicam [facility]) 15 MG tablet Take one tablet every day for 1 week starting 08/11/14, then once daily as needed. 08/11/14   Stephanie Coup Street, MD  Multiple Vitamin (MULTIVITAMIN WITH MINERALS) TABS Take 1 tablet by mouth daily.    Historical Provider, MD  ondansetron (ZOFRAN) 4 MG tablet Take 1 tablet (4 mg total) by mouth every 8 (eight) hours as needed for nausea or vomiting. 11/03/13   Myra Rude, MD  potassium chloride (K-DUR) 10 MEQ tablet Take 1 tablet (10 mEq total) by mouth daily. 05/27/14   Myra Rude, MD   traMADol (ULTRAM) 50 MG tablet Take 1 tablet (50 mg total) by mouth every 8 (eight) hours as needed. 12/01/14   Myra Rude, MD   Meds Ordered and Administered this Visit  Medications - No data to display  BP 145/93 mmHg  Pulse 77  Temp(Src) 98.1 F (36.7 C) (Oral)  Resp 18  SpO2 96% No data found.   Physical Exam  Constitutional: She is oriented to person, place, and time. She appears well-developed and well-nourished.  Obese  HENT:  Head: Normocephalic and atraumatic.  Right Ear: External ear normal.  Left Ear: External ear normal.  Mouth/Throat: Oropharynx is clear and moist.  Eyes: EOM are normal. Pupils are equal, round, and reactive to light. Right eye exhibits no discharge. Left eye exhibits no discharge. No scleral icterus.  Fundi normal, both eyes are mildly injected-worse on the left  Neck: Normal range of motion. Neck supple. No thyromegaly present.  Cardiovascular: Normal rate, regular rhythm and normal heart sounds.   Pulmonary/Chest: Effort normal and breath sounds normal.  Musculoskeletal: Normal range of motion.  Lymphadenopathy:    She has no cervical adenopathy.  Neurological: She is alert and oriented to person, place, and time. No cranial nerve deficit. Coordination normal.  Patient has a stable gait  Skin: Skin is warm and dry.  Psychiatric: Her behavior is normal.  Patient appears mildly depressed.    ED Course  Procedures (including critical care time)    MDM  Assessment: Hypertensive woman with significant stressors over the last month associated with a feeling of lightheadedness which has become constant. She's not sleeping well.  Plan: I've asked patient to stop the cyclobenzaprine and we will start her on site kilograms 10 mg daily in hopes that I relieving some of her stress, her blood pressure gets better and her feelings of dizziness start to resolve. I've asked her to follow-up with her personal doctor next door at the family practice  center in the next 3 weeks. I've also asked to stop taking meclizine  Signed, Elvina Sidle M.D.    Elvina Sidle, MD 01/04/15 2008

## 2015-01-06 ENCOUNTER — Ambulatory Visit: Payer: No Typology Code available for payment source | Admitting: Family Medicine

## 2015-01-12 ENCOUNTER — Ambulatory Visit (INDEPENDENT_AMBULATORY_CARE_PROVIDER_SITE_OTHER): Payer: No Typology Code available for payment source | Admitting: Family Medicine

## 2015-01-12 VITALS — BP 158/98 | Temp 98.6°F | Ht 64.0 in | Wt 237.4 lb

## 2015-01-12 DIAGNOSIS — R51 Headache: Secondary | ICD-10-CM

## 2015-01-12 DIAGNOSIS — I10 Essential (primary) hypertension: Secondary | ICD-10-CM

## 2015-01-12 DIAGNOSIS — R519 Headache, unspecified: Secondary | ICD-10-CM

## 2015-01-12 NOTE — Progress Notes (Signed)
Subjective:    Veronica [name] - 54 y.o. female MRN [id number]  Date of birth: 1960/10/24  HPI  Veronica [name] is here for HA and HTN.  HEADACHE  Headache started 3 weeks ago Pain is throbbing  Severity:  8/10 Location: frontal and top of her of her head  Medications tried: tramadol helps some  Head trauma: syncope in 2010 and hit head  Sudden onset: no Previous similar headaches: no Taking blood thinners: no History of cancer: no  Symptoms Nose congestion stuffiness:  no Nausea vomiting: nausea but no vomiting  Photophobia: no Noise sensitivity: no Double vision or loss of vision: no Fever: no Neck Stiffness: no Trouble walking or speaking: no No family hx of migraines  No caffeine  Goes to bed at 11 pm and wakes up at 5 -6 am.  Wakes up several times throughout the night  Diet: brk: nothing lunch: potatoes soup supper: baked potatoes snack: grapes  Water: several water bottles per day  3-4  She was given celexa by the urgent care.   HTN Disease Monitoring: Home BP Monitoring none   Medications: Chest pain-  no Dyspnea- no Compliance-  yes.  Lightheadedness-  no   Edema- no  Health Maintenance:  Health Maintenance Due  Topic Date Due  . Hepatitis C Screening  1960/10/22  . HIV Screening  01/17/1978  . PAP SMEAR  01/18/1984  . COLONOSCOPY  01/17/2013  . MAMMOGRAM  01/26/2014  . INFLUENZA VACCINE  11/29/2014    -  reports that she has never smoked. She has never used smokeless tobacco. - Review of Systems: Per HPI. - Past Medical History: Patient Active Problem List   Diagnosis Date Noted  . Chest pain 11/29/2014  . Headache 11/12/2014  . Leg swelling 10/10/2014  . Insomnia 12/15/2012  . Essential hypertension, benign 04/10/2012  . SLE (systemic lupus erythematosus) 04/10/2012   - Medications: reviewed and updated Current Outpatient Prescriptions  Medication Sig Dispense Refill  . carvedilol (COREG) 6.25 MG tablet Take 1 tablet (6.25 mg  total) by mouth 2 (two) times daily with a meal. 60 tablet 3  . citalopram (CELEXA) 10 MG tablet Take 1 tablet (10 mg total) by mouth daily. 30 tablet 2  . hydrochlorothiazide (HYDRODIURIL) 25 MG tablet Take 1 tablet (25 mg total) by mouth daily. 90 tablet 3  . ibuprofen (ADVIL,MOTRIN) 600 MG tablet Take 1 tablet (600 mg total) by mouth every 8 (eight) hours as needed. 90 tablet 0  . losartan (COZAAR) 100 MG tablet Take 1 tablet (100 mg total) by mouth daily. 30 tablet 3  . meloxicam (MOBIC) 15 MG tablet Take one tablet every day for 1 week starting 08/11/14, then once daily as needed. 30 tablet 0  . Multiple Vitamin (MULTIVITAMIN WITH MINERALS) TABS Take 1 tablet by mouth daily.    . ondansetron (ZOFRAN) 4 MG tablet Take 1 tablet (4 mg total) by mouth every 8 (eight) hours as needed for nausea or vomiting. 20 tablet 0  . potassium chloride (K-DUR) 10 MEQ tablet Take 1 tablet (10 mEq total) by mouth daily. 90 tablet 1   No current facility-administered medications for this visit.     Review of Systems See HPI     Objective:   Physical Exam BP 158/98 mmHg  Temp(Src) 98.6 F (37 C) (Oral)  Ht 5\' 4"  (1.626 m)  Wt 237 lb 6.4 oz (107.684 kg)  BMI 40.73 kg/m2 Gen: NAD, alert, cooperative with exam, well-appearing CV: RRR, good  S1/S2, no murmur, no edema, capillary refill brisk  Resp: CTABL, no wheezes, non-labored Skin: no rashes, normal turgor  Neuro: no gross deficits.      Assessment & Plan:   Headache She was evaluated in the Urgent care and thought that her HA may be contributing from stress  She was started on celexa  Doubtful for [facility] and more likely tension in nature. Doubtful for migraine She denies any depressive type symptoms today  - she agreed to try this medication for at least one month and see if there are any benefits.  - advised that she could try massage and stress coping techniques.     Essential hypertension, benign Continue to be uncontrolled  Compliant  with medication  - make appt with Dr. Raymondo Band for 24 hour BP monitoring - considering to increase her coreg

## 2015-01-12 NOTE — Patient Instructions (Signed)
Thank you for coming in,   Please make an appointment with Dr. Raymondo Band when you're leaving today at the front desk. This appointment will be for 24-hour blood pressure monitoring.  Please make the appointment during a normal work day for you in one urine off work.  We will continue the Celexa for another 3 weeks to see if that has helped with your headache. He can try other alternatives such as massage and/or yoga to help relieve some stress.  Please bring all of your medications with you to each visit.   Sign up for My Chart to have easy access to your labs results, and communication with your Primary care physician   Please feel free to call with any questions or concerns at any time, at [phone]. --Dr. Jordan Likes

## 2015-01-13 NOTE — Assessment & Plan Note (Signed)
Continue to be uncontrolled  Compliant with medication  - make appt with Dr. Raymondo Band for 24 hour BP monitoring - considering to increase her coreg

## 2015-01-13 NOTE — Assessment & Plan Note (Signed)
She was evaluated in the Urgent care and thought that her HA may be contributing from stress  She was started on celexa  Doubtful for [facility] and more likely tension in nature. Doubtful for migraine She denies any depressive type symptoms today  - she agreed to try this medication for at least one month and see if there are any benefits.  - advised that she could try massage and stress coping techniques.

## 2015-01-24 ENCOUNTER — Ambulatory Visit: Payer: No Typology Code available for payment source | Admitting: Pharmacist

## 2015-01-31 ENCOUNTER — Ambulatory Visit (INDEPENDENT_AMBULATORY_CARE_PROVIDER_SITE_OTHER): Payer: No Typology Code available for payment source | Admitting: Family Medicine

## 2015-01-31 ENCOUNTER — Encounter: Payer: Self-pay | Admitting: Family Medicine

## 2015-01-31 VITALS — BP 176/96 | HR 77 | Temp 97.8°F | Ht 64.0 in | Wt 239.4 lb

## 2015-01-31 DIAGNOSIS — I10 Essential (primary) hypertension: Secondary | ICD-10-CM

## 2015-01-31 MED ORDER — LOSARTAN POTASSIUM 100 MG PO TABS: 100.0000 mg | ORAL_TABLET | Freq: Every day | ORAL | Status: DC

## 2015-01-31 MED ORDER — HYDROCHLOROTHIAZIDE 50 MG PO TABS: 50.0000 mg | ORAL_TABLET | Freq: Every day | ORAL | Status: DC

## 2015-01-31 NOTE — Progress Notes (Signed)
Subjective: Veronica [name] is a 54 y.o. female here for hypertension follow up.   She endorses periodic dizziness recently described as "light-headedness" unrelated to body/head positioning or rising from sitting/laying positions. No illusory movement perception, N/V, aural fullness, hearing changes. She often checks BP at home and reports most have diastolics > . Is 100% compliant with medications and even started taking amlodipine again on Friday. This had been discontinued due to foot swelling but she still had some left over. EMR reports she was prescribed 10mg . She walks as much as possible and is on her feet most of each working day but does eat fast food frequently (hushpuppies today) and is not heavy on fruit intake.   - ROS: Denies CP, SOB, palpitations, syncope, orthopnea, PND, recent headaches, vision changes, claudication, leg swelling. - PMFSH: Non-smoker, no EtOH, no illicit drugs. - Medications: reviewed and updated  Objective: BP 176/96 mmHg  Pulse 77  Temp(Src) 97.8 F (36.6 C) (Oral)  Ht 5\' 4"  (1.626 m)  Wt 239 lb 6.4 oz (108.591 kg)  BMI 41.07 kg/m2 Gen: Frustrated but well-appearing obese 54 y.o.female in no distress HEENT: Sclerae clear, conjunctivae normal, PERRL, no papilledema noted, poor dentition Neck: Normal carotid upstroke, no bruits; thyroid not enlarged  Pulm: Non-labored breathing room air; CTAB CV: Regular rate with normal S1/S2, no murmur; no LE edema, no JVD; DP and radial pulses symmetric and 2+. Homan's sign absent. cap refill < 2 sec. GI: Nontender, nondistended, no HSM     Chemistry      Component Value Date/Time   NA 139 10/08/2014 1604   K 3.5 10/08/2014 1604   CL 100 10/08/2014 1604   CO2 26 10/08/2014 1604   BUN 14 10/08/2014 1604   CREATININE 0.81 10/08/2014 1604   CREATININE 1.28* 07/30/2012 0425      Component Value Date/Time   CALCIUM 9.4 10/08/2014 1604   ALKPHOS 48 09/12/2012 1048   AST 13 09/12/2012 1048   ALT 9  09/12/2012 1048   BILITOT 0.5 09/12/2012 1048     Lab Results  Component Value Date   TSH 0.611 12/15/2012   Assessment & Plan: Veronica [name] is a 54 y.o. female here for uncontrolled resistant HTN.  Resistant hypertension Resistant hypertension, uncontrolled on 4 agents though not all at maxed doses. Has been taking ARB at max dose, coreg at lower dose, HCTZ at 25mg  which we doubled today. She had foot swelling with amlodipine in the past so this was discontinued, though it seems dizziness is worse than foot swelling so she restarted it (10mg ) 3 days ago and remains well above goal. Work up has included negative sleep study. - Emphasized the need to limit salt (especially in frequent fast food), increase fruits, exercise, etc. in addition to continued medication compliance.  - Increase HCTZ to 50mg  as above, will need renal function evaluation soon. Renal function/electrolytes normal at last check 3 mo ago.  - Discussed spironolactone stack therapy though would like to a) defer to PCP and pharmacy clinic and b) not sign her up for such frequent monitoring without verifying failure of therapeutic doses of other meds. - Primary elements of DASH diet and weight loss strategies reviewed in detail. Handout given.  - May benefit from home monitoring, which I believe is the point of the visit on 10/6 with Dr. 44.  - Consider further work up of secondary causes: TSH, 24-hr urine aldosterone, renal doppler (?SLE-related accelerated atherosclerosis)

## 2015-01-31 NOTE — Patient Instructions (Signed)
We made the following medication changes today:  - Change hydrochlorothiazide to 50 MG instead of 25MG  daily.  - Keep taking amlodipine for now.  - High blood pressure is best treated by losing weight, taking medications as directed, avoiding salt in your diet and increasing potassium from fruits and vegetables (called the DASH diet). - Follow up on Thursday and bring your blood pressure cuff to that appointment.  If you feel faint, experience new/worsening chest pain or shortness of breath, or notice rapid leg swelling and/or weight gain you should call the clinic at [phone] OR go directly to the ER.  Take care,  - Dr. [phone]  DASH Eating Plan DASH stands for "Dietary Approaches to Stop Hypertension." The DASH eating plan is a healthy eating plan that has been shown to reduce high blood pressure (hypertension). Additional health benefits may include reducing the risk of type 2 diabetes mellitus, heart disease, and stroke. The DASH eating plan may also help with weight loss. WHAT DO I NEED TO KNOW ABOUT THE DASH EATING PLAN? For the DASH eating plan, you will follow these general guidelines:  Choose foods with a percent daily value for sodium of less than 5% (as listed on the food label).  Use salt-free seasonings or herbs instead of table salt or sea salt.  Check with your health care provider or pharmacist before using salt substitutes.  Eat lower-sodium products, often labeled as "lower sodium" or "no salt added."  Eat fresh foods.  Eat more vegetables, fruits, and low-fat dairy products.  Choose whole grains. Look for the word "whole" as the first word in the ingredient list.  Choose fish and skinless chicken or Jarvis Newcomer more often than red meat. Limit fish, poultry, and meat to 6 oz (170 g) each day.  Limit sweets, desserts, sugars, and sugary drinks.  Choose heart-healthy fats.  Limit cheese to 1 oz (28 g) per day.  Eat more home-cooked food and less restaurant, buffet, and  fast food.  Limit fried foods.  Cook foods using methods other than frying.  Limit canned vegetables. If you do use them, rinse them well to decrease the sodium.  When eating at a restaurant, ask that your food be prepared with less salt, or no salt if possible. WHAT FOODS CAN I EAT? Seek help from a dietitian for individual calorie needs. Grains Whole grain or whole wheat bread. Brown rice. Whole grain or whole wheat pasta. Quinoa, bulgur, and whole grain cereals. Low-sodium cereals. Corn or whole wheat flour tortillas. Whole grain cornbread. Whole grain crackers. Low-sodium crackers. Vegetables Fresh or frozen vegetables (raw, steamed, roasted, or grilled). Low-sodium or reduced-sodium tomato and vegetable juices. Low-sodium or reduced-sodium tomato sauce and paste. Low-sodium or reduced-sodium canned vegetables.  Fruits All fresh, canned (in natural juice), or frozen fruits. Meat and Other Protein Products Ground beef (85% or leaner), grass-fed beef, or beef trimmed of fat. Skinless chicken or [place]. Ground chicken or [place]. Pork trimmed of fat. All fish and seafood. Eggs. Dried beans, peas, or lentils. Unsalted nuts and seeds. Unsalted canned beans. Dairy Low-fat dairy products, such as skim or 1% milk, 2% or reduced-fat cheeses, low-fat ricotta or cottage cheese, or plain low-fat yogurt. Low-sodium or reduced-sodium cheeses. Fats and Oils Tub margarines without trans fats. Light or reduced-fat mayonnaise and salad dressings (reduced sodium). Avocado. Safflower, olive, or canola oils. Natural peanut or almond butter. Other Unsalted popcorn and pretzels.  WHAT FOODS ARE NOT RECOMMENDED? Grains White bread. White pasta. White rice. Refined cornbread.  Bagels and croissants. Crackers that contain trans fat. Vegetables Creamed or fried vegetables. Vegetables in a cheese sauce. Regular canned vegetables. Regular canned tomato sauce and paste. Regular tomato and vegetable  juices. Fruits Dried fruits. Canned fruit in light or heavy syrup. Fruit juice. Meat and Other Protein Products Fatty cuts of meat. Ribs, chicken wings, bacon, sausage, bologna, salami, chitterlings, fatback, hot dogs, bratwurst, and packaged luncheon meats. Salted nuts and seeds. Canned beans with salt. Dairy Whole or 2% milk, cream, half-and-half, and cream cheese. Whole-fat or sweetened yogurt. Full-fat cheeses or blue cheese. Nondairy creamers and whipped toppings. Processed cheese, cheese spreads, or cheese curds. Condiments Onion and garlic salt, seasoned salt, table salt, and sea salt. Canned and packaged gravies. Worcestershire sauce. Tartar sauce. Barbecue sauce. Teriyaki sauce. Soy sauce, including reduced sodium. Steak sauce. Fish sauce. Oyster sauce. Cocktail sauce. Horseradish. Ketchup and mustard. Meat flavorings and tenderizers. Bouillon cubes. Hot sauce. Tabasco sauce. Marinades. Taco seasonings. Relishes. Fats and Oils Butter, stick margarine, lard, shortening, ghee, and bacon fat. Coconut, palm kernel, or palm oils. Regular salad dressings. Other Pickles and olives. Salted popcorn and pretzels. The items listed above may not be a complete list of foods and beverages to avoid. Contact your dietitian for more information. WHERE CAN I FIND MORE INFORMATION? National [employer], [employer], [employer] Institute: [employer]

## 2015-01-31 NOTE — Assessment & Plan Note (Signed)
Resistant hypertension, uncontrolled on 4 agents though not all at maxed doses. Has been taking ARB at max dose, coreg at lower dose, HCTZ at 25mg  which we doubled today. She had foot swelling with amlodipine in the past so this was discontinued, though it seems dizziness is worse than foot swelling so she restarted it (10mg ) 3 days ago and remains well above goal. Work up has included negative sleep study. - Emphasized the need to limit salt (especially in frequent fast food), increase fruits, exercise, etc. in addition to continued medication compliance.  - Increase HCTZ to 50mg  as above, will need renal function evaluation soon. Renal function/electrolytes normal at last check 3 mo ago.  - Discussed spironolactone stack therapy though would like to a) defer to PCP and pharmacy clinic and b) not sign her up for such frequent monitoring without verifying failure of therapeutic doses of other meds. - Primary elements of DASH diet and weight loss strategies reviewed in detail. Handout given.  - May benefit from home monitoring, which I believe is the point of the visit on 10/6 with Dr. .  - Consider further work up of secondary causes: TSH, 24-hr urine aldosterone, renal doppler (?SLE-related accelerated atherosclerosis)

## 2015-02-03 ENCOUNTER — Encounter: Payer: Self-pay | Admitting: Pharmacist

## 2015-02-03 ENCOUNTER — Ambulatory Visit (INDEPENDENT_AMBULATORY_CARE_PROVIDER_SITE_OTHER): Payer: No Typology Code available for payment source | Admitting: Pharmacist

## 2015-02-03 DIAGNOSIS — I10 Essential (primary) hypertension: Secondary | ICD-10-CM | POA: Diagnosis not present

## 2015-02-03 DIAGNOSIS — G4459 Other complicated headache syndrome: Secondary | ICD-10-CM

## 2015-02-03 NOTE — Progress Notes (Signed)
S:    Patient arrives in good spirits ambulating without assistance.    Presents to the clinic for ambulatory blood pressure evaluation.  Diagnosed with Hypertension in 2010.  Patient has had chronic headaches since 2009. Has been taking generic Excedrin daily for the past month. Additionally, she also takes ibuprofen 2 tabs (400mg ) on most days of the week. Was prescribed citalopram in September 2016, unable to tolerate and patient self discontinued due to dysphoria.  Medication compliance is reported to be excellent.  Discussed procedure for wearing the monitor and gave patient written instructions. Monitor was placed on non-dominant arm with instructions to return in the morning.   Current BP Medications include:  Amlodipine 10mg  daily, Coreg 6.25mg  BID, HCTZ 50mg , Losartan 100mg   Antihypertensives tried in the past include: Previously reports verapamil was effective in past.  Patient returned to the clinic and reported no significant complications with blood pressure monitor. She did remove the cuff two times during her 24-hour monitoring. Her husband assisted with placement of the cuff after showering.  Dietary habits include:  O:   Last 3 Office BP readings: BP Readings from Last 3 Encounters:  02/03/15 173/95  01/31/15 176/96  01/12/15 158/98     Today's Office BP reading: 168/95 mmHg (automatic reading)  ABPM Study Data: Arm Placement left arm   Overall Mean 24hr BP:   135/80 mmHg HR: 80   Daytime Mean BP:  138/85 mmHg HR: 76   Nighttime Mean BP:  125/64 mmHg HR: 70   Dipping Pattern: Yes.    Sys:   9.8%   Dia: 24.4%   [normal dipping ~10-20%] Patient reports she wakes in the middle of the night 0000-0300 to bring son to work. The cuff was repositioned twice during the capture period. Two-thirds of the readings were captured by the machine.  Non-hypertensive ABPM thresholds: daytime BP <135/85 mmHg, sleeptime BP <120/70 mmHg NICE Hypertension Guidelines ( ) using  ABPM: Stage I: >135/85 mmHg, Stage 2: >150/95 mmHg)   BMET    Component Value Date/Time   NA 139 10/08/2014 1604   K 3.5 10/08/2014 1604   CL 100 10/08/2014 1604   CO2 26 10/08/2014 1604   GLUCOSE 127* 10/08/2014 1604   BUN 14 10/08/2014 1604   CREATININE 0.81 10/08/2014 1604   CREATININE 1.28* 07/30/2012 0425   CALCIUM 9.4 10/08/2014 1604   GFRNONAA 84 10/08/2014 1604   GFRNONAA 48* 07/30/2012 0425   GFRAA >89 10/08/2014 1604   GFRAA 56* 07/30/2012 0425    A/P: History of hypertension for >5 years found to have periods of elevated as well as near normal blood pressure. Given variable blood pressure and chronic history of analgesia use (NSAIDS) and caffeine for headaches, pain may be a significant driver of erratic blood pressure. Given 24-hour ambulatory blood pressure demonstrates near goal when taking 4 drug regimen with an average blood pressure of 135/80  mmHg, and a nocturnal dipping pattern that is near normal.  Changes to medications reduced amlodipine to 5 mg due to history of lower extremity edema with 10 mg. Re-evaluate headache and pain control in 2-3 weeks. Re-evaluate lower extremity swelling with continued use of amlodipine. Consider the use of losartan/HCTZ 100-25 combination for patient convenience and possible up titration of carvedilol.  Chronic headache over past few years utilizing analgesics on a daily basis. Given diagnosis of fibromyalgia by Dr. 12/08/2014 in January 2016 consideration of additional therapy was discussed with Dr. 12/08/2014. He agreed to start low dose duloxetine at this time.  Educated patient on purpose, proper use and potential adverse effects.  Results reviewed and written information provided.  Total time in face-to-face counseling 40 minutes.   F/U Clinic Visit with Dr. Jordan Likes.  Patient seen with Kathlynn Grate, PharmD Candidate, and Casilda Carls, PharmD Resident.

## 2015-02-03 NOTE — Patient Instructions (Addendum)
Remember to take your blood pressure medicines everyday.  Reduce amlodipine to 5 mg daily. You can finish up your 10 mg tablets by cutting them in half, after that you can pick up a new prescription for amlodipine 5 mg. Continue your other blood pressure medications as you were previously taking them.  Try to reduce your use of Excedrin and ibuprofen. You will begin a new medicine, duloxetine 30 mg, today. Take this medicine every day.  Follow up with Dr. Jordan Likes in 2-3 weeks. It was nice seeing you today.

## 2015-02-07 MED ORDER — DULOXETINE HCL 30 MG PO CPEP: 30.0000 mg | ORAL_CAPSULE | Freq: Every day | ORAL | Status: DC

## 2015-02-07 MED ORDER — AMLODIPINE BESYLATE 5 MG PO TABS: 5.0000 mg | ORAL_TABLET | Freq: Every evening | ORAL | Status: DC

## 2015-02-07 NOTE — Assessment & Plan Note (Addendum)
Chronic headache over past few years utilizing analgesics on a daily basis. Given diagnosis of fibromyalgia by Dr. Netta Cedars in January 2016 consideration of additional therapy was discussed with Dr. Jordan Likes. He agreed to start low dose duloxetine at this time. Educated patient on purpose, proper use and potential adverse effects.

## 2015-02-07 NOTE — Assessment & Plan Note (Signed)
History of hypertension for >5 years found to have periods of elevated as well as near normal blood pressure. Given variable blood pressure and chronic history of analgesia use (NSAIDS) and caffeine for headaches, pain may be a significant driver of erratic blood pressure. Given 24-hour ambulatory blood pressure demonstrates near goal when taking 4 drug regimen with an average blood pressure of 135/80  mmHg, and a nocturnal dipping pattern that is near normal.  Changes to medications reduced amlodipine to 5 mg due to history of lower extremity edema with 10 mg. Re-evaluate headache and pain control in 2-3 weeks. Re-evaluate lower extremity swelling with continued use of amlodipine. Consider the use of losartan/HCTZ 100-25 combination for patient convenience and possible up titration of carvedilol.

## 2015-02-07 NOTE — Progress Notes (Signed)
Patient ID: Veronica [name], female   DOB: May 16, 1960, 54 y.o.   MRN: [id number] Reviewed: Agree with Dr. Macky Lower documentation and management.

## 2015-02-21 ENCOUNTER — Ambulatory Visit (INDEPENDENT_AMBULATORY_CARE_PROVIDER_SITE_OTHER): Payer: No Typology Code available for payment source | Admitting: Family Medicine

## 2015-02-21 ENCOUNTER — Encounter: Payer: Self-pay | Admitting: Family Medicine

## 2015-02-21 VITALS — BP 138/84 | HR 63 | Temp 98.3°F | Ht 64.0 in | Wt 235.0 lb

## 2015-02-21 DIAGNOSIS — Z114 Encounter for screening for human immunodeficiency virus [HIV]: Secondary | ICD-10-CM | POA: Diagnosis not present

## 2015-02-21 DIAGNOSIS — Z0000 Encounter for general adult medical examination without abnormal findings: Secondary | ICD-10-CM

## 2015-02-21 DIAGNOSIS — I10 Essential (primary) hypertension: Secondary | ICD-10-CM

## 2015-02-21 DIAGNOSIS — Z1159 Encounter for screening for other viral diseases: Secondary | ICD-10-CM

## 2015-02-21 DIAGNOSIS — Z23 Encounter for immunization: Secondary | ICD-10-CM | POA: Diagnosis not present

## 2015-02-21 DIAGNOSIS — Z1211 Encounter for screening for malignant neoplasm of colon: Secondary | ICD-10-CM

## 2015-02-21 DIAGNOSIS — G4459 Other complicated headache syndrome: Secondary | ICD-10-CM

## 2015-02-21 MED ORDER — CARVEDILOL 6.25 MG PO TABS: 6.2500 mg | ORAL_TABLET | Freq: Two times a day (BID) | ORAL | Status: DC

## 2015-02-21 MED ORDER — POTASSIUM CHLORIDE ER 10 MEQ PO TBCR: 10.0000 meq | EXTENDED_RELEASE_TABLET | Freq: Every day | ORAL | Status: DC

## 2015-02-21 MED ORDER — LOSARTAN POTASSIUM 100 MG PO TABS: 100.0000 mg | ORAL_TABLET | Freq: Every day | ORAL | Status: DC

## 2015-02-21 NOTE — Progress Notes (Signed)
Subjective:    Veronica [name] - 54 y.o. female MRN [id number]  Date of birth: 03/18/1961  HPI  Veronica [name] is here for HTN and HA.  HTN Disease Monitoring: Home BP Monitoring yes  Medications:losartain, HCTZ, amlodipine, coreg  Chest pain- no     Dyspnea- no Compliance-  yes.  Lightheadedness-  no   Edema- no   HEADACHE  Pain is resolved  She was seen by Dr. Raymondo Band and started on cymbalta.  She has some right sided facial pain but having tooth pain.   History of partial hysterectomy in unsure of last Pap smear Has not had colonoscopy up to this point. Mammogram reported normal that was performed last year Needs flu vaccine   Health Maintenance:  Health Maintenance Due  Topic Date Due  . Hepatitis C Screening  09/27/1962  . HIV Screening  01/17/1978  . PAP SMEAR  01/18/1984  . COLONOSCOPY  01/17/2013  . INFLUENZA VACCINE  11/29/2014  . MAMMOGRAM  12/30/2014    -  reports that she has never smoked. She has never used smokeless tobacco. - Review of Systems: Per HPI. - Past Medical History: Patient Active Problem List   Diagnosis Date Noted  . Health care maintenance 02/21/2015  . Headache 11/12/2014  . Leg swelling 10/10/2014  . Insomnia 12/15/2012  . Resistant hypertension 04/10/2012  . SLE (systemic lupus erythematosus) (HCC) 04/10/2012   - Medications: reviewed and updated Current Outpatient Prescriptions  Medication Sig Dispense Refill  . amLODipine (NORVASC) 5 MG tablet Take 1 tablet (5 mg total) by mouth every evening. 30 tablet 11  . aspirin-acetaminophen-caffeine (EXCEDRIN MIGRAINE) 250-250-65 MG tablet Take 1 tablet by mouth every 6 (six) hours as needed for headache.    . carvedilol (COREG) 6.25 MG tablet Take 1 tablet (6.25 mg total) by mouth 2 (two) times daily with a meal. 60 tablet 3  . cetirizine (ZYRTEC) 10 MG tablet Take 10 mg by mouth as needed for allergies.    . cyclobenzaprine (FLEXERIL) 10 MG tablet Take 10 mg by mouth 3 (three)  times daily as needed for muscle spasms.    Marland Kitchen dimenhyDRINATE (DRAMAMINE) 50 MG tablet Take 25 mg by mouth every 8 (eight) hours as needed for dizziness.    . DULoxetine (CYMBALTA) 30 MG capsule Take 1 capsule (30 mg total) by mouth daily. 30 capsule 3  . hydrochlorothiazide (HYDRODIURIL) 50 MG tablet Take 1 tablet (50 mg total) by mouth daily. 30 tablet 0  . ibuprofen (ADVIL,MOTRIN) 200 MG tablet Take 200 mg by mouth every 6 (six) hours as needed for headache.    . losartan (COZAAR) 100 MG tablet Take 1 tablet (100 mg total) by mouth daily. 90 tablet 1  . Multiple Vitamin (MULTIVITAMIN WITH MINERALS) TABS Take 1 tablet by mouth daily.    . potassium chloride (K-DUR) 10 MEQ tablet Take 1 tablet (10 mEq total) by mouth daily. 90 tablet 1   No current facility-administered medications for this visit.     Review of Systems See HPI     Objective:   Physical Exam BP 138/84 mmHg  Pulse 63  Temp(Src) 98.3 F (36.8 C) (Oral)  Ht 5\' 4"  (1.626 m)  Wt 235 lb (106.595 kg)  BMI 40.32 kg/m2 Gen: NAD, alert, cooperative with exam, well-appearing CV: RRR, good S1/S2, no murmur,  Resp: CTABL, no wheezes, non-labored Skin: no rashes, normal turgor  Neuro: no gross deficits.      Assessment & Plan:   Resistant hypertension  Controlled on current regimen - Continue medications - Refilled losartan, Coreg, and K-Dur  Headache Headache has resolved since starting therapy with duloxetine - Continue current medication - Follow-up when necessary  Health care maintenance Referral placed to gastroenterology for colon cancer screening. Flu vaccine today Hepatitis C and HIV labs to be drawn. She will schedule follow-up for Pap smear

## 2015-02-21 NOTE — Assessment & Plan Note (Signed)
Controlled on current regimen - Continue medications - Refilled losartan, Coreg, and K-Dur

## 2015-02-21 NOTE — Patient Instructions (Signed)
Thank you for coming in,   I will call you or send a letter with the results from today.    Please bring all of your medications with you to each visit.   Sign up for My Chart to have easy access to your labs results, and communication with your Primary care physician   Please feel free to call with any questions or concerns at any time, at [phone]. --Dr. Schmitz  

## 2015-02-21 NOTE — Assessment & Plan Note (Signed)
Headache has resolved since starting therapy with duloxetine - Continue current medication - Follow-up when necessary

## 2015-02-21 NOTE — Assessment & Plan Note (Signed)
Referral placed to gastroenterology for colon cancer screening. Flu vaccine today Hepatitis C and HIV labs to be drawn. She will schedule follow-up for Pap smear

## 2015-02-22 LAB — HIV ANTIBODY (ROUTINE TESTING W REFLEX): HIV 1&2 Ab, 4th Generation: NONREACTIVE

## 2015-02-22 LAB — HEPATITIS C ANTIBODY: HCV Ab: NEGATIVE

## 2015-02-24 ENCOUNTER — Encounter: Payer: Self-pay | Admitting: Family Medicine

## 2015-03-04 ENCOUNTER — Encounter (HOSPITAL_COMMUNITY): Payer: Self-pay | Admitting: *Deleted

## 2015-03-04 ENCOUNTER — Emergency Department (HOSPITAL_COMMUNITY)
Admission: EM | Admit: 2015-03-04 | Discharge: 2015-03-04 | Disposition: A | Discharge status: Home / Self Care | Payer: No Typology Code available for payment source | Attending: Emergency Medicine | Admitting: Emergency Medicine

## 2015-03-04 DIAGNOSIS — G8929 Other chronic pain: Secondary | ICD-10-CM | POA: Diagnosis not present

## 2015-03-04 DIAGNOSIS — M109 Gout, unspecified: Secondary | ICD-10-CM | POA: Insufficient documentation

## 2015-03-04 DIAGNOSIS — J3489 Other specified disorders of nose and nasal sinuses: Secondary | ICD-10-CM | POA: Insufficient documentation

## 2015-03-04 DIAGNOSIS — R51 Headache: Secondary | ICD-10-CM | POA: Insufficient documentation

## 2015-03-04 DIAGNOSIS — Z79899 Other long term (current) drug therapy: Secondary | ICD-10-CM | POA: Insufficient documentation

## 2015-03-04 DIAGNOSIS — R42 Dizziness and giddiness: Secondary | ICD-10-CM | POA: Insufficient documentation

## 2015-03-04 DIAGNOSIS — I10 Essential (primary) hypertension: Secondary | ICD-10-CM | POA: Insufficient documentation

## 2015-03-04 DIAGNOSIS — H538 Other visual disturbances: Secondary | ICD-10-CM | POA: Insufficient documentation

## 2015-03-04 DIAGNOSIS — R0981 Nasal congestion: Secondary | ICD-10-CM | POA: Diagnosis not present

## 2015-03-04 DIAGNOSIS — R519 Headache, unspecified: Secondary | ICD-10-CM

## 2015-03-04 DIAGNOSIS — H539 Unspecified visual disturbance: Secondary | ICD-10-CM

## 2015-03-04 LAB — URINALYSIS, ROUTINE W REFLEX MICROSCOPIC
Bilirubin Urine: NEGATIVE
Glucose, UA: NEGATIVE mg/dL
Hgb urine dipstick: NEGATIVE
Ketones, ur: NEGATIVE mg/dL
Leukocytes, UA: NEGATIVE
Nitrite: NEGATIVE
Protein, ur: NEGATIVE mg/dL
Specific Gravity, Urine: 1.006 (ref 1.005–1.030)
Urobilinogen, UA: 0.2 mg/dL (ref 0.0–1.0)
pH: 7.5 (ref 5.0–8.0)

## 2015-03-04 LAB — CBC WITH DIFFERENTIAL/PLATELET
Basophils Absolute: 0 10*3/uL (ref 0.0–0.1)
Basophils Relative: 0 %
Eosinophils Absolute: 0.1 10*3/uL (ref 0.0–0.7)
Eosinophils Relative: 1 %
HCT: 37.4 % (ref 36.0–46.0)
Hemoglobin: 11.8 g/dL — ABNORMAL LOW (ref 12.0–15.0)
Lymphocytes Relative: 30 %
Lymphs Abs: 1.9 10*3/uL (ref 0.7–4.0)
MCH: 26.8 pg (ref 26.0–34.0)
MCHC: 31.6 g/dL (ref 30.0–36.0)
MCV: 84.8 fL (ref 78.0–100.0)
Monocytes Absolute: 0.4 10*3/uL (ref 0.1–1.0)
Monocytes Relative: 7 %
Neutro Abs: 3.9 10*3/uL (ref 1.7–7.7)
Neutrophils Relative %: 62 %
Platelets: 350 10*3/uL (ref 150–400)
RBC: 4.41 MIL/uL (ref 3.87–5.11)
RDW: 13.4 % (ref 11.5–15.5)
WBC: 6.3 10*3/uL (ref 4.0–10.5)

## 2015-03-04 LAB — BASIC METABOLIC PANEL
Anion gap: 7 (ref 5–15)
BUN: 7 mg/dL (ref 6–20)
CO2: 27 mmol/L (ref 22–32)
Calcium: 9.3 mg/dL (ref 8.9–10.3)
Chloride: 102 mmol/L (ref 101–111)
Creatinine, Ser: 0.89 mg/dL (ref 0.44–1.00)
GFR calc Af Amer: >60 mL/min (ref >60)
GFR calc non Af Amer: >60 mL/min (ref >60)
Glucose, Bld: 99 mg/dL (ref 65–99)
Potassium: 3.5 mmol/L (ref 3.5–5.1)
Sodium: 136 mmol/L (ref 135–145)

## 2015-03-04 LAB — I-STAT TROPONIN, ED: Troponin i, poc: 0 ng/mL (ref 0.00–0.08)

## 2015-03-04 MED ORDER — FLUTICASONE PROPIONATE 50 MCG/ACT NA SUSP: 2.0000 | Freq: Every day | NASAL | Status: DC

## 2015-03-04 MED ORDER — KETOROLAC TROMETHAMINE 30 MG/ML IJ SOLN
30.0000 mg | Freq: Once | INTRAMUSCULAR | Status: AC
  Administered 2015-03-04: 30 mg via INTRAVENOUS
  Filled 2015-03-04: qty 1

## 2015-03-04 MED ORDER — SODIUM CHLORIDE 0.9 % IV SOLN
Freq: Once | INTRAVENOUS | Status: AC
  Administered 2015-03-04: 12:00:00 via INTRAVENOUS

## 2015-03-04 NOTE — Discharge Instructions (Signed)
Your work up today was unremarkable. Stay well hydrated, and use tylenol or motrin for your pain. Use flonase as directed for sinus congestion. May consider over the counter antihistamine use for your symptoms. Follow up with your regular doctor in 1 week for recheck of ongoing symptoms and ongoing management. Return to the ER for changes or worsening symptoms.   Dizziness Dizziness is a common problem. It makes you feel unsteady or lightheaded. You may feel like you are about to pass out (faint). Dizziness can lead to injury if you stumble or fall. Anyone can get dizzy, but dizziness is more common in older adults. This condition can be caused by a number of things, including:  Medicines.  Dehydration.  Illness. HOME CARE Following these instructions may help with your condition: Eating and Drinking  Drink enough fluid to keep your pee (urine) clear or pale yellow. This helps to keep you from getting dehydrated. Try to drink more clear fluids, such as water.  Do not drink alcohol.  Limit how much caffeine you drink or eat if told by your doctor.  Limit how much salt you drink or eat if told by your doctor. Activity  Avoid making quick movements.  When you stand up from sitting in a chair, steady yourself until you feel okay.  In the morning, first sit up on the side of the bed. When you feel okay, stand slowly while you hold onto something. Do this until you know that your balance is fine.  Move your legs often if you need to stand in one place for a long time. Tighten and relax your muscles in your legs while you are standing.  Do not drive or use heavy machinery if you feel dizzy.  Avoid bending down if you feel dizzy. Place items in your home so that they are easy for you to reach without leaning over. Lifestyle  Do not use any tobacco products, including cigarettes, chewing tobacco, or electronic cigarettes. If you need help quitting, ask your doctor.  Try to lower your  stress level, such as with yoga or meditation. Talk with your doctor if you need help. General Instructions  Watch your dizziness for any changes.  Take medicines only as told by your doctor. Talk with your doctor if you think that your dizziness is caused by a medicine that you are taking.  Tell a friend or a family member that you are feeling dizzy. If he or she notices any changes in your behavior, have this person call your doctor.  Keep all follow-up visits as told by your doctor. This is important. GET HELP IF:  Your dizziness does not go away.  Your dizziness or light-headedness gets worse.  You feel sick to your stomach (nauseous).  You have trouble hearing.  You have new symptoms.  You are unsteady on your feet or you feel like the room is spinning. GET HELP RIGHT AWAY IF:  You throw up (vomit) or have diarrhea and are unable to eat or drink anything.  You have trouble:  Talking.  Walking.  Swallowing.  Using your arms, hands, or legs.  You feel generally weak.  You are not thinking clearly or you have trouble forming sentences. It may take a friend or family member to notice this.  You have:  Chest pain.  Pain in your belly (abdomen).  Shortness of breath.  Sweating.  Your vision changes.  You are bleeding.  You have a headache.  You have neck pain or  a stiff neck.  You have a fever.   This information is not intended to replace advice given to you by your health care provider. Make sure you discuss any questions you have with your health care provider.   Document Released: 04/05/2011 Document Revised: 08/31/2014 Document Reviewed: 04/12/2014 Elsevier Interactive Patient Education 2016 [employer]  Sinus Headache A sinus headache occurs when the paranasal sinuses become clogged or swollen. Paranasal sinuses are air pockets within the bones of the face. Sinus headaches can range from mild to severe. CAUSES A sinus headache can result  from various conditions that affect the sinuses, such as:  Colds.  Sinus infections.  Allergies. SYMPTOMS The main symptom of this condition is a headache that may feel like pain or pressure in the face, forehead, ears, or upper teeth. People who have a sinus headache often have other symptoms, such as:  Congested or runny nose.  Fever.  Inability to smell. Weather changes can make symptoms worse. DIAGNOSIS This condition may be diagnosed based on:  A physical exam and medical history.  Imaging tests, such as a CT scan and MRI, to check for problems with the sinuses.  A specialist may look into the sinuses with a tool that has a camera (endoscopy). TREATMENT Treatment for this condition depends on the cause.  Sinus pain that is caused by a sinus infection may be treated with antibiotic medicine.  Sinus pain that is caused by allergies may be helped by allergy medicines (antihistamines) and medicated nasal sprays.  Sinus pain that is caused by congestion may be helped by flushing the nose and sinuses with saline solution. HOME CARE INSTRUCTIONS  Take medicines only as directed by your health care provider.  If you were prescribed an antibiotic medicine, finish all of it even if you start to feel better.  If you have congestion, use a nasal spray to help reduce pressure.  If directed, apply a warm, moist washcloth to your face to help relieve pain. SEEK MEDICAL CARE IF:  You have headaches more than one time each week.  You have sensitivity to light or sound.  You have a fever.  You feel sick to your stomach (nauseous) or you throw up (vomit).  Your headaches do not get better with treatment. Many people think that they have a sinus headache when they actually have migraines or tension headaches. SEEK IMMEDIATE MEDICAL CARE IF:  You have vision problems.  You have sudden, severe pain in your face or head.  You have a seizure.  You are confused.  You have a  stiff neck.   This information is not intended to replace advice given to you by your health care provider. Make sure you discuss any questions you have with your health care provider.   Document Released: 05/24/2004 Document Revised: 08/31/2014 Document Reviewed: 04/12/2014 Elsevier Interactive Patient Education [employer]

## 2015-03-04 NOTE — ED Notes (Signed)
Pt states she has been lightheaded since August. Pt has been started on 2 new BP meds that have been fluctuating doses. Pt also endorses bilateral blurred vision with left sided cheek pain. Pt states she had a nose bleed today and also has ringing in her left ear.

## 2015-03-04 NOTE — ED Provider Notes (Signed)
CSN: [id number]     Arrival date & time 03/04/15  1053 History   First MD Initiated Contact with Patient 03/04/15 1057     Chief Complaint  Patient presents with  . Dizziness  . Blurred Vision     (Consider location/radiation/quality/duration/timing/severity/associated sxs/prior Treatment) HPI Comments: Veronica [name] is a 54 y.o. female with a PMHx of chronic dizziness, vertigo, HTN, SLE, chronic headaches, and arthritis, who presents to the ED with complaints of ongoing lightheadedness 3 months gradually worsening since increasing her HCTZ and starting Cymbalta 1 month ago. She reports the lightheadedness is worse with head movements, and in the past she has tried meclizine without any relief. She reports she has had a mild 1/10 intermittent headaches for months, and today she is left-sided facial pain which she describes as 5/10 constant left cheek dull nonradiating pain with no known aggravating or alleviating factors. She also endorses sinus congestion and blurry vision in both eyes without diplopia. She denies any fevers, chills, neck pain or stiffness, chest pain, shortness breath, abdominal pain, nausea, vomiting, diarrhea, constipation, sore throat, eye pain or drainage, tinnitus, hearing loss, dysuria, hematuria, numbness, tingling, or weakness. No recent head injuries. She increased her HCTZ, and added cymbalta ~1 month ago, and in August she was started on Coreg.  Patient is a 54 y.o. female presenting with dizziness. The history is provided by the patient. No language interpreter was used.  Dizziness Quality:  Lightheadedness Severity:  Moderate Onset quality:  Gradual Duration:  3 months Timing:  Intermittent Progression:  Worsening Chronicity:  Recurrent Context: head movement   Relieved by:  Nothing Worsened by:  Turning head Ineffective treatments:  Medication (meclizine in the past) Associated symptoms: headaches and vision changes (blurry)   Associated symptoms: no  chest pain, no diarrhea, no hearing loss, no nausea, no shortness of breath, no tinnitus, no vomiting and no weakness   Risk factors: hx of vertigo, multiple medications and new medications     Past Medical History  Diagnosis Date  . Hypertension   . SLE (systemic lupus erythematosus) (HCC) 1998  . Headache(784.0)   . Hx of dizziness   . Arthritis   . History of blood transfusion    Past Surgical History  Procedure Laterality Date  . Abdominal hysterectomy  09/2008  . Cesarean section  1992, 1995  . Cholecystectomy N/A 07/18/2012    Procedure: LAPAROSCOPIC CHOLECYSTECTOMY WITH INTRAOPERATIVE CHOLANGIOGRAM;  Surgeon: Atilano Ina, MD;  Location: [facility] OR;  Service: General;  Laterality: N/A;  . Ercp N/A 07/29/2012    Procedure: ENDOSCOPIC RETROGRADE CHOLANGIOPANCREATOGRAPHY (ERCP);  Surgeon: Theda Belfast, MD;  Location: [facility];  Service: Endoscopy;  Laterality: N/A;  Dr. Elnoria Howard said he would start this PT arond 1330( AW)   Family History  Problem Relation Age of Onset  . Diabetes Mother   . Multiple sclerosis Sister   . Hypertension Father   . Hypertension Brother    Social History  Substance Use Topics  . Smoking status: Never Smoker   . Smokeless tobacco: Never Used  . Alcohol Use: No   OB History    No data available     Review of Systems  Constitutional: Negative for fever and chills.  HENT: Positive for sinus pressure. Negative for ear discharge, ear pain, hearing loss, rhinorrhea, sore throat and tinnitus.   Eyes: Positive for visual disturbance (blurry). Negative for pain and discharge.  Respiratory: Negative for shortness of breath.   Cardiovascular: Negative for chest pain.  Gastrointestinal: Negative for nausea, vomiting, abdominal pain, diarrhea and constipation.  Genitourinary: Negative for dysuria and hematuria.  Musculoskeletal: Negative for myalgias, arthralgias and neck stiffness.  Skin: Negative for color change.  Allergic/Immunologic: Negative for  immunocompromised state.  Neurological: Positive for dizziness, light-headedness and headaches. Negative for syncope, weakness and numbness.  Psychiatric/Behavioral: Negative for confusion.   10 Systems reviewed and are negative for acute change except as noted in the HPI.    Allergies  Tessalon  Home Medications   Prior to Admission medications   Medication Sig Start Date End Date Taking? Authorizing Provider  amLODipine (NORVASC) 5 MG tablet Take 1 tablet (5 mg total) by mouth every evening. 02/07/15   Moses Manners, MD  aspirin-acetaminophen-caffeine (EXCEDRIN MIGRAINE) [phone] MG tablet Take 1 tablet by mouth every 6 (six) hours as needed for headache.    Historical Provider, MD  carvedilol (COREG) 6.25 MG tablet Take 1 tablet (6.25 mg total) by mouth 2 (two) times daily with a meal. 02/21/15   Myra Rude, MD  cetirizine (ZYRTEC) 10 MG tablet Take 10 mg by mouth as needed for allergies.    Historical Provider, MD  cyclobenzaprine (FLEXERIL) 10 MG tablet Take 10 mg by mouth 3 (three) times daily as needed for muscle spasms.    Historical Provider, MD  dimenhyDRINATE (DRAMAMINE) 50 MG tablet Take 25 mg by mouth every 8 (eight) hours as needed for dizziness.    Historical Provider, MD  DULoxetine (CYMBALTA) 30 MG capsule Take 1 capsule (30 mg total) by mouth daily. 02/07/15   Moses Manners, MD  hydrochlorothiazide (HYDRODIURIL) 50 MG tablet Take 1 tablet (50 mg total) by mouth daily. 01/31/15   Tyrone Nine, MD  ibuprofen (ADVIL,MOTRIN) 200 MG tablet Take 200 mg by mouth every 6 (six) hours as needed for headache.    Historical Provider, MD  losartan (COZAAR) 100 MG tablet Take 1 tablet (100 mg total) by mouth daily. 02/21/15   Myra Rude, MD  Multiple Vitamin (MULTIVITAMIN WITH MINERALS) TABS Take 1 tablet by mouth daily.    Historical Provider, MD  potassium chloride (K-DUR) 10 MEQ tablet Take 1 tablet (10 mEq total) by mouth daily. 02/21/15   Myra Rude, MD    BP 146/92 mmHg  Pulse 63  Temp(Src) 98.1 F (36.7 C) (Oral)  Resp 15  Ht 5\' 4"  (1.626 m)  Wt 240 lb (108.863 kg)  BMI 41.18 kg/m2  SpO2 99% Physical Exam  Constitutional: She is oriented to person, place, and time. Vital signs are normal. She appears well-developed and well-nourished.  Non-toxic appearance. No distress.  Afebrile, nontoxic, NAD  HENT:  Head: Normocephalic and atraumatic.  Right Ear: Hearing, tympanic membrane, external ear and ear canal normal.  Left Ear: Hearing, tympanic membrane, external ear and ear canal normal.  Nose: Mucosal edema and rhinorrhea present. Left sinus exhibits maxillary sinus tenderness.  Mouth/Throat: Uvula is midline, oropharynx is clear and moist and mucous membranes are normal. No trismus in the jaw. No uvula swelling.  Ears are clear bilaterally. Nose with nasal mucosal edema and clear rhinorrhea, mild L sided maxillary tenderness. Oropharynx clear and moist, without uvular swelling or deviation, no trismus or drooling, no tonsillar swelling or erythema, no exudates.    Eyes: Conjunctivae and EOM are normal. Pupils are equal, round, and reactive to light. Right eye exhibits no discharge. Left eye exhibits no discharge.  PERRL, EOMI, no nystagmus, no visual field deficits  Visual Acuity - Bilateral Distance: 20/13 ;  R Distance: 20/13 ; L Distance: 20/15  Neck: Normal range of motion. Neck supple.  FROM intact without spinous process TTP, no bony stepoffs or deformities, no paraspinous muscle TTP or muscle spasms. No rigidity or meningeal signs. No bruising or swelling.   Cardiovascular: Normal rate, regular rhythm, normal heart sounds and intact distal pulses.  Exam reveals no gallop and no friction rub.   No murmur heard. Pulmonary/Chest: Effort normal and breath sounds normal. No respiratory distress. She has no decreased breath sounds. She has no wheezes. She has no rhonchi. She has no rales.  Abdominal: Soft. Normal appearance and bowel  sounds are normal. She exhibits no distension. There is no tenderness. There is no rigidity, no rebound, no guarding, no CVA tenderness, no tenderness at McBurney's point and negative Murphy's sign.  Musculoskeletal: Normal range of motion.  MAE x4 Strength and sensation grossly intact Distal pulses intact Gait steady  Neurological: She is alert and oriented to person, place, and time. She has normal strength. No cranial nerve deficit or sensory deficit. She displays a negative Romberg sign. Coordination and gait normal. GCS eye subscore is 4. GCS verbal subscore is 5. GCS motor subscore is 6.  CN 2-12 grossly intact A&O x4 GCS 15 Sensation and strength intact Gait nonataxic including with tandem walking Coordination with finger-to-nose WNL Neg romberg, neg pronator drift   Skin: Skin is warm, dry and intact. No rash noted.  Psychiatric: She has a normal mood and affect.  Nursing note and vitals reviewed.   ED Course  Procedures (including critical care time) Labs Review Labs Reviewed  CBC WITH DIFFERENTIAL/PLATELET - Abnormal; Notable for the following:    Hemoglobin 11.8 (*)    All other components within normal limits  BASIC METABOLIC PANEL  URINALYSIS, ROUTINE W REFLEX MICROSCOPIC (NOT AT [facility])  I-STAT TROPOININ, ED    Imaging Review No results found. I have personally reviewed and evaluated these images and lab results as part of my medical decision-making.   EKG Interpretation   Date/Time:  Friday March 04 2015 11:06:58 EDT Ventricular Rate:  64 PR Interval:  141 QRS Duration: 92 QT Interval:  421 QTC Calculation: 434 R Axis:   56 Text Interpretation:  Sinus rhythm Confirmed by ZAVITZ  MD, JOSHUA ([id number])  on 03/04/2015 1:12:41 PM      MDM   Final diagnoses:  Intermittent lightheadedness  Chronic nonintractable headache, unspecified headache type  Sinus congestion  Vision changes    54 y.o. female here with ongoing lightheadedness x3 months, worsening  since starting HCTZ and cymbalta last month. Some change with head movements. States she has had an intermittent HA for months as well, and today has had L sided facial pain and sinus congestion. Also has blurry vision without diplopia. On exam, no focal neuro deficits, mild L sided sinus tenderness, likely sinus pressure related. Pt was previously on meclizine but stated it didn't help, doesn't want to try this here. Will get basic labs, EKG, and trop and check visual acuity. Doubt need for head CT at this time, could benefit from outpt MRI. Will give fluids and toradol for pain. Will reassess shortly.   12:54 PM Visual Acuity preserved (Bilateral Distance: 20/13 ; R Distance: 20/13 ; L Distance: 20/15). Trop neg. EKG unremarkable. CBC w/diff unremarkable. BMP in process. U/A clear. Will await U/A.  1:20 PM U/A clear. Pt feeling improved with respect to HA/facial pain. Likely early sinusitis, will treat with flonase which could help if she has some  eustachian tube dysfunction. I question whether some of her symptoms are related to either BPPV or possibly medication side effects. Pt hadn't take her BP meds this morning and her BP has been stable in the 120-130s/70-80s. Could potentially come off some of her antihypertensives. Will have pt use tylenol/motrin for pain, and flonase, and f/up with her PCP in 1wk to discuss coming off meds vs outpt MRI vs possible neuro/ENT referral. I explained the diagnosis and have given explicit precautions to return to the ER including for any other new or worsening symptoms. The patient understands and accepts the medical plan as it's been dictated and I have answered their questions. Discharge instructions concerning home care and prescriptions have been given. The patient is STABLE and is discharged to home in good condition.  BP 128/75 mmHg  Pulse 61  Temp(Src) 98.1 F (36.7 C) (Oral)  Resp 21  Ht 5\' 4"  (1.626 m)  Wt 240 lb (108.863 kg)  BMI 41.18 kg/m2  SpO2  100%  Meds ordered this encounter  Medications  . 0.9 %  sodium chloride infusion    Sig:   . ketorolac (TORADOL) 30 MG/ML injection 30 mg    Sig:   . fluticasone (FLONASE) 50 MCG/ACT nasal spray    Sig: Place 2 sprays into both nostrils daily.    Dispense:  16 g    Refill:  0    Order Specific Question:  Supervising Provider    Answer:  Eber Hong [[id number]]     Daeshon Grammatico Camprubi-Soms, PA-C 03/04/15 1322  Blane Ohara, MD 03/04/15 1350

## 2015-03-04 NOTE — ED Notes (Signed)
PT comfortable with discharge and follow up instructions. Prescriptions x1. 

## 2015-03-04 NOTE — ED Notes (Signed)
Patient states she always wears glasses

## 2015-03-11 ENCOUNTER — Encounter (INDEPENDENT_AMBULATORY_CARE_PROVIDER_SITE_OTHER): Payer: No Typology Code available for payment source | Admitting: Family Medicine

## 2015-03-11 ENCOUNTER — Ambulatory Visit (INDEPENDENT_AMBULATORY_CARE_PROVIDER_SITE_OTHER): Payer: No Typology Code available for payment source | Admitting: Family Medicine

## 2015-03-11 ENCOUNTER — Encounter: Payer: Self-pay | Admitting: Family Medicine

## 2015-03-11 VITALS — BP 138/88 | HR 77 | Temp 98.7°F | Wt 237.2 lb

## 2015-03-11 DIAGNOSIS — R42 Dizziness and giddiness: Secondary | ICD-10-CM | POA: Diagnosis not present

## 2015-03-11 NOTE — Progress Notes (Signed)
Subjective:    JACYLN PETHTEL - 54 y.o. female MRN [id number]  Date of birth: 05-22-1960  HPI  DALONDA CEDOTAL is here for dizziness.   She was seen in the ED on 11/4 due being unable to walk. Her symptoms didn't improve while in the ED.  Her symptoms fluctuate throughout the course of the day.  Started on cymbalta about 30 days ago.  There are no days that she goes without dizziness.  Feeling dizzy for months.  Dizziness is occuring when she is moving. She is asymptomatic when she sitting.  Feels like room spins: yes when she is walking  Lightheadedness when stands: yes Palpitations or heart racing: no Prior dizziness: ongoing  Medications tried: meclizine with some improvement  Taking blood thinners: no  Symptoms Hearing Loss: no Ear Pain or fullness: no Nausea or vomiting: no Vision difficulty or double vision: yes Falls: no Head trauma: no Weakness in arm or leg: no Speaking problems: no Headache: no  Health Maintenance:  Health Maintenance Due  Topic Date Due  . PAP SMEAR  01/18/1984  . COLONOSCOPY  01/17/2013  . MAMMOGRAM  12/30/2014    -  reports that she has never smoked. She has never used smokeless tobacco. - Review of Systems: Per HPI. - Past Medical History: Patient Active Problem List   Diagnosis Date Noted  . Dizziness 03/11/2015  . Health care maintenance 02/21/2015  . Headache 11/12/2014  . Leg swelling 10/10/2014  . Insomnia 12/15/2012  . Resistant hypertension 04/10/2012  . SLE (systemic lupus erythematosus) (HCC) 04/10/2012   - Medications: reviewed and updated Current Outpatient Prescriptions  Medication Sig Dispense Refill  . amLODipine (NORVASC) 5 MG tablet Take 1 tablet (5 mg total) by mouth every evening. 30 tablet 11  . aspirin-acetaminophen-caffeine (EXCEDRIN MIGRAINE) 250-250-65 MG tablet Take 1 tablet by mouth every 6 (six) hours as needed for headache.    . carvedilol (COREG) 6.25 MG tablet Take 1 tablet (6.25 mg total) by  mouth 2 (two) times daily with a meal. 60 tablet 3  . cetirizine (ZYRTEC) 10 MG tablet Take 10 mg by mouth as needed for allergies.    Marland Kitchen dimenhyDRINATE (DRAMAMINE) 50 MG tablet Take 25 mg by mouth every 8 (eight) hours as needed for dizziness.    . DULoxetine (CYMBALTA) 30 MG capsule Take 1 capsule (30 mg total) by mouth daily. 30 capsule 3  . fluticasone (FLONASE) 50 MCG/ACT nasal spray Place 2 sprays into both nostrils daily. 16 g 0  . hydrochlorothiazide (HYDRODIURIL) 50 MG tablet Take 1 tablet (50 mg total) by mouth daily. 30 tablet 0  . ibuprofen (ADVIL,MOTRIN) 200 MG tablet Take 200 mg by mouth every 6 (six) hours as needed for headache.    . losartan (COZAAR) 100 MG tablet Take 1 tablet (100 mg total) by mouth daily. 90 tablet 1  . Multiple Vitamin (MULTIVITAMIN WITH MINERALS) TABS Take 1 tablet by mouth daily.    . potassium chloride (K-DUR) 10 MEQ tablet Take 1 tablet (10 mEq total) by mouth daily. 90 tablet 1   No current facility-administered medications for this visit.     Review of Systems See HPI     Objective:   Physical Exam BP 138/88 mmHg  Pulse 77  Temp(Src) 98.7 F (37.1 C) (Oral)  Wt 237 lb 3.2 oz (107.593 kg) Gen: NAD, alert, cooperative with exam, well-appearing HEENT: NCAT, clear conjunctiva, supple neck, EOMI CV: Regular rate,  no edema, Skin: no rashes, normal turgor  Neuro: normal gait, Neurovascularly intact  Psych: alert and oriented  Visual acuity:  Corrected  R 20/20  L 20/20  B/L 20/20    Assessment & Plan:   Dizziness Has a previous diagnosis of BPPV  Evaluated in ED with normal neuro exam  Possible for Meniere's Disease vs BPPV vs medication related (cymbalta)  - referral to ENT  - she will continue meclizine and dramamine PRN

## 2015-03-11 NOTE — Patient Instructions (Signed)
Thank you for coming in,   I have made a referral to Ear, nose and throat doctor.   Please bring all of your medications with you to each visit.   Sign up for My Chart to have easy access to your labs results, and communication with your Primary care physician   Please feel free to call with any questions or concerns at any time, at [phone]. --Dr. Jordan Likes

## 2015-03-11 NOTE — Assessment & Plan Note (Signed)
Has a previous diagnosis of BPPV  Evaluated in ED with normal neuro exam  Possible for Meniere's Disease vs BPPV vs medication related (cymbalta)  - referral to ENT  - she will continue meclizine and dramamine PRN

## 2015-03-15 ENCOUNTER — Telehealth: Payer: Self-pay | Admitting: Family Medicine

## 2015-03-15 NOTE — Telephone Encounter (Signed)
Pt called back to speak to the doctor jw

## 2015-03-21 NOTE — Telephone Encounter (Signed)
Left VM for patient.   Take the best time and phone number to call and I will try to call her back.   Myra Rude, MD PGY-3, [facility] 03/21/2015, 3:19 PM

## 2015-04-01 ENCOUNTER — Ambulatory Visit (INDEPENDENT_AMBULATORY_CARE_PROVIDER_SITE_OTHER): Payer: No Typology Code available for payment source | Admitting: Family Medicine

## 2015-04-01 ENCOUNTER — Encounter: Payer: Self-pay | Admitting: Family Medicine

## 2015-04-01 VITALS — BP 148/88 | HR 76 | Temp 98.2°F | Ht 64.0 in | Wt 236.7 lb

## 2015-04-01 DIAGNOSIS — R51 Headache: Secondary | ICD-10-CM | POA: Diagnosis not present

## 2015-04-01 DIAGNOSIS — R200 Anesthesia of skin: Secondary | ICD-10-CM | POA: Diagnosis not present

## 2015-04-01 DIAGNOSIS — R079 Chest pain, unspecified: Secondary | ICD-10-CM

## 2015-04-01 DIAGNOSIS — R42 Dizziness and giddiness: Secondary | ICD-10-CM

## 2015-04-01 NOTE — Patient Instructions (Signed)
Referring to neurology and physical therapy to do vestibular rehab Getting MRI scan of your head  We'll call you with these appointments.   Be well, Dr. Pollie Meyer

## 2015-04-03 NOTE — Assessment & Plan Note (Signed)
Likely BPPV, but posterior intracranial lesion also a possibility. Has had ongoing symptoms for months now, interfering with her day to day life. No significant improvement in symptoms with meclizine. Warrants further evaluation.  -  given persistence of symptoms as well as risk factors for CVA (lupus) will proceed with neuro imaging with MRI brain w & w/o contrast.  - refer to physical therapy vestibular rehab - also refer to neurology per patient's request

## 2015-04-03 NOTE — Progress Notes (Signed)
Date of Visit: 04/01/2015   HPI:  Patient presents to follow up on dizziness. Has been seen several times for this. Has had ongoing sense of being off balance for several months. Notices it especially when she turns her head to the right, or when she's moving. Associated with mild blurry vision on occasion. L ear pops some. Was referred to ENT for possible menieres disease, but reports ENT doctor told her everything was fine. Records from that visit are not available.  Has history of lupus but is not on any medications for lupus present. Diagnosed in 1998, no flares since 2009. No history of blood clots in the past.   Denies associated nausea/vomting, lower extremity swelling. On ROS does say she's had occasional L sided chest discomfort for about 2 weeks, which is not worse with exertion but does notice it more when she takes a deep breath. Pain does not radiate anywhere. No shortness of breath.   ROS: See HPI.  PMFSH: history of SLE, resistant hypertension, otherwise healthy  PHYSICAL EXAM: BP 148/88 mmHg  Pulse 76  Temp(Src) 98.2 F (36.8 C) (Oral)  Ht 5\' 4"  (1.626 m)  Wt 236 lb 11.2 oz (107.366 kg)  BMI 40.61 kg/m2 Gen: NAD, pleasant, cooperative HEENT: normocephalic, atraumatic. moist mucous membranes. tympanic membrane's normal bilaterally Heart: regular rate and rhythm no murmur Lungs: clear to auscultation bilaterally, normal work of breathing  Neuro: cranial nerves II-XII tested and intact. Speech normal. Full strength bilat upper and lower ext. Gait normal.  Ext: minimal swelling bilateral lower extremities  ASSESSMENT/PLAN:  Dizziness Likely BPPV, but posterior intracranial lesion also a possibility. Has had ongoing symptoms for months now, interfering with her day to day life. No significant improvement in symptoms with meclizine. Warrants further evaluation.  -  given persistence of symptoms as well as risk factors for CVA (lupus) will proceed with neuro imaging with MRI  brain w & w/o contrast.  - refer to physical therapy vestibular rehab - also refer to neurology per patient's request  Chest discomfort - noted on review of systems, not a presenting complaint. Patient not worried about this pain. Not typical for cardiac etiology by history. EKG performed today and was consistent with prior EKG's. Will hold on further workup for now.  FOLLOW UP: Referring to vestibular rehab & neurology  J. [place], MD [facility]

## 2015-04-04 ENCOUNTER — Encounter: Payer: Self-pay | Admitting: Neurology

## 2015-04-04 ENCOUNTER — Ambulatory Visit (INDEPENDENT_AMBULATORY_CARE_PROVIDER_SITE_OTHER): Payer: No Typology Code available for payment source | Admitting: Neurology

## 2015-04-04 VITALS — BP 152/88 | HR 71 | Ht 64.0 in | Wt 238.0 lb

## 2015-04-04 DIAGNOSIS — I10 Essential (primary) hypertension: Secondary | ICD-10-CM | POA: Diagnosis not present

## 2015-04-04 DIAGNOSIS — H8111 Benign paroxysmal vertigo, right ear: Secondary | ICD-10-CM

## 2015-04-04 NOTE — Progress Notes (Signed)
NEUROLOGY CONSULTATION NOTE  Veronica [name] MRN: [id number] DOB: [birth year]  Referring provider: Dr. Pollie Meyer Primary care provider: Dr. Jordan Likes  Reason for consult:  dizziness  HISTORY OF PRESENT ILLNESS: Veronica [name] is a 54 year old right-handed female with SLE and hypertension who presents for dizziness.  History obtained by patient and PCP note.  Labs reviewed.  For the past 4 months, she reports episodes of dizziness.  With any movement, she notes a sensation that she is moving.  It resolves when she is still.  It can last all day.  There is no associated nausea, slurred speech, focal numbness or weakness.  She notes occasional blurred vision but no diplopia.  She has a mild frontal non-throbbing headache.  She also reports ringing in the left ear but no hearing loss.  Meclizine was ineffective.  She denies prior history of dizziness.  She denies history of headaches.  She was referred to ENT for evaluation of possible Meniere's disease, and was told her that her exam was normal.  She also reports occasional episodes of lightheadedness, like she is going to pass out.  Prior orthostatics have been negative.  Blood pressure has been elevated and her BP meds have recently been adjusted.  She is scheduled to have an MRI of the brain with and without contrast for later this week.  Recent labs demonstrated unremarkable CBC and BMP and nonreactive HIV antibody titer.  PAST MEDICAL HISTORY: Past Medical History  Diagnosis Date  . Hypertension   . SLE (systemic lupus erythematosus) (HCC) 1998  . Headache(784.0)   . Hx of dizziness   . Arthritis   . History of blood transfusion     PAST SURGICAL HISTORY: Past Surgical History  Procedure Laterality Date  . Abdominal hysterectomy  09/2008  . Cesarean section  1992, 1995  . Cholecystectomy N/A 07/18/2012    Procedure: LAPAROSCOPIC CHOLECYSTECTOMY WITH INTRAOPERATIVE CHOLANGIOGRAM;  Surgeon: Atilano Ina, MD;  Location: [facility] OR;   Service: General;  Laterality: N/A;  . Ercp N/A 07/29/2012    Procedure: ENDOSCOPIC RETROGRADE CHOLANGIOPANCREATOGRAPHY (ERCP);  Surgeon: Theda Belfast, MD;  Location: [facility];  Service: Endoscopy;  Laterality: N/A;  Dr. Elnoria Howard said he would start this PT arond 1330( AW)    MEDICATIONS: Current Outpatient Prescriptions on File Prior to Visit  Medication Sig Dispense Refill  . amLODipine (NORVASC) 5 MG tablet Take 1 tablet (5 mg total) by mouth every evening. 30 tablet 11  . carvedilol (COREG) 6.25 MG tablet Take 1 tablet (6.25 mg total) by mouth 2 (two) times daily with a meal. 60 tablet 3  . cetirizine (ZYRTEC) 10 MG tablet Take 10 mg by mouth as needed for allergies.    Marland Kitchen dimenhyDRINATE (DRAMAMINE) 50 MG tablet Take 25 mg by mouth every 8 (eight) hours as needed for dizziness.    . DULoxetine (CYMBALTA) 30 MG capsule Take 1 capsule (30 mg total) by mouth daily. 30 capsule 3  . fluticasone (FLONASE) 50 MCG/ACT nasal spray Place 2 sprays into both nostrils daily. 16 g 0  . hydrochlorothiazide (HYDRODIURIL) 50 MG tablet Take 1 tablet (50 mg total) by mouth daily. (Patient taking differently: Take 25 mg by mouth daily. ) 30 tablet 0  . ibuprofen (ADVIL,MOTRIN) 200 MG tablet Take 200 mg by mouth every 6 (six) hours as needed for headache.    . losartan (COZAAR) 100 MG tablet Take 1 tablet (100 mg total) by mouth daily. 90 tablet 1  . Multiple Vitamin (MULTIVITAMIN WITH  MINERALS) TABS Take 1 tablet by mouth daily.    . potassium chloride (K-DUR) 10 MEQ tablet Take 1 tablet (10 mEq total) by mouth daily. 90 tablet 1  . aspirin-acetaminophen-caffeine (EXCEDRIN MIGRAINE) 250-250-65 MG tablet Take 1 tablet by mouth every 6 (six) hours as needed for headache.     No current facility-administered medications on file prior to visit.    ALLERGIES: Allergies  Allergen Reactions  . Tessalon [Benzonatate] Other (See Comments)    Lip and eye swelling    FAMILY HISTORY: Family History  Problem  Relation Age of Onset  . Diabetes Mother   . Multiple sclerosis Sister   . Hypertension Father   . Hypertension Brother     SOCIAL HISTORY: Social History   Social History  . Marital Status: Legally Separated    Spouse Name: N/A  . Number of Children: N/A  . Years of Education: N/A   Occupational History  . Not on file.   Social History Main Topics  . Smoking status: Never Smoker   . Smokeless tobacco: Never Used  . Alcohol Use: No  . Drug Use: No  . Sexual Activity: Yes   Other Topics Concern  . Not on file   Social History Narrative   Pt lives with spouse. Some college.     REVIEW OF SYSTEMS: Constitutional: No fevers, chills, or sweats, no generalized fatigue, change in appetite Eyes: No visual changes, double vision, eye pain Ear, nose and throat: No hearing loss, ear pain, nasal congestion, sore throat Cardiovascular: No chest pain, palpitations Respiratory:  No shortness of breath at rest or with exertion, wheezes GastrointestinaI: No nausea, vomiting, diarrhea, abdominal pain, fecal incontinence Genitourinary:  No dysuria, urinary retention or frequency Musculoskeletal:  No neck pain, back pain Integumentary: No rash, pruritus, skin lesions Neurological: as above Psychiatric: No depression, insomnia, anxiety Endocrine: No palpitations, fatigue, diaphoresis, mood swings, change in appetite, change in weight, increased thirst Hematologic/Lymphatic:  No anemia, purpura, petechiae. Allergic/Immunologic: no itchy/runny eyes, nasal congestion, recent allergic reactions, rashes  PHYSICAL EXAM: Filed Vitals:   04/04/15 0838  BP: 144/86  Pulse: 71  Orthostatics negative today General: No acute distress.  Patient appears well-groomed.  Head:  Normocephalic/atraumatic Eyes:  fundi unremarkable, without vessel changes, exudates, hemorrhages or papilledema. Neck: supple, no paraspinal tenderness, full range of motion Back: No paraspinal tenderness Heart: regular  rate and rhythm Lungs: Clear to auscultation bilaterally. Vascular: No carotid bruits. Neurological Exam: Mental status: alert and oriented to person, place, and time, recent and remote memory intact, fund of knowledge intact, attention and concentration intact, speech fluent and not dysarthric, language intact. Cranial nerves: CN I: not tested CN II: pupils equal, round and reactive to light, visual fields intact, fundi unremarkable, without vessel changes, exudates, hemorrhages or papilledema. CN III, IV, VI:  full range of motion, no nystagmus, no ptosis CN V: facial sensation intact CN VII: upper and lower face symmetric CN VIII: hearing intact CN IX, X: gag intact, uvula midline CN XI: sternocleidomastoid and trapezius muscles intact CN XII: tongue midline Bulk & Tone: normal, no fasciculations. Motor:  5/5 throughout  Sensation: temperature and vibration sensation intact. Deep Tendon Reflexes:  2+ throughout, toes downgoing.  Finger to nose testing:  Without dysmetria.  Heel to shin:  Without dysmetria.  Gait:  Normal station and stride.  Able to turn and tandem walk. Romberg negative. Dix-Hallpike positive in the right ear.  IMPRESSION: Probable BPPV HTN  PLAN: 1.  I agree with plan for  vestibular rehab 2.  She has an MRI scheduled for Friday.  If unremarkable, no follow up warranted. 3.  BP mildly elevated today.  Should be rechecked with PCP.  Thank you for allowing me to take part in the care of this patient.  Shon Millet, DO  CC:  Levert Feinstein, MD  Clare Gandy, MD

## 2015-04-04 NOTE — Patient Instructions (Signed)
I do think it is inner ear issue. We will see what the MRI shows I agree with seeing the physical therapist for vestibular rehab

## 2015-04-08 ENCOUNTER — Telehealth: Payer: Self-pay | Admitting: *Deleted

## 2015-04-08 ENCOUNTER — Ambulatory Visit (HOSPITAL_COMMUNITY)
Admission: RE | Admit: 2015-04-08 | Discharge: 2015-04-08 | Disposition: A | Discharge status: Home / Self Care | Payer: No Typology Code available for payment source | Source: Ambulatory Visit | Attending: Family Medicine | Admitting: Family Medicine

## 2015-04-08 DIAGNOSIS — R42 Dizziness and giddiness: Secondary | ICD-10-CM | POA: Insufficient documentation

## 2015-04-08 DIAGNOSIS — R079 Chest pain, unspecified: Secondary | ICD-10-CM | POA: Insufficient documentation

## 2015-04-08 DIAGNOSIS — R200 Anesthesia of skin: Secondary | ICD-10-CM | POA: Insufficient documentation

## 2015-04-08 DIAGNOSIS — R51 Headache: Secondary | ICD-10-CM | POA: Insufficient documentation

## 2015-04-08 MED ORDER — GADOBENATE DIMEGLUMINE 529 MG/ML IV SOLN
20.0000 mL | Freq: Once | INTRAVENOUS | Status: AC | PRN
  Administered 2015-04-08: 20 mL via INTRAVENOUS

## 2015-04-08 NOTE — Telephone Encounter (Signed)
Patient verified DOB Patient informed of referral to Physical Therapy has been placed and authorized. Patient should receive an appointment with date and time next week. Patient stated she would wait for phone call and had no further questions.

## 2015-04-19 ENCOUNTER — Ambulatory Visit: Payer: No Typology Code available for payment source | Attending: Family Medicine

## 2015-04-19 ENCOUNTER — Telehealth: Payer: Self-pay | Admitting: Family Medicine

## 2015-04-19 ENCOUNTER — Telehealth: Payer: Self-pay

## 2015-04-19 DIAGNOSIS — R269 Unspecified abnormalities of gait and mobility: Secondary | ICD-10-CM | POA: Diagnosis present

## 2015-04-19 DIAGNOSIS — R42 Dizziness and giddiness: Secondary | ICD-10-CM | POA: Diagnosis present

## 2015-04-19 NOTE — Therapy (Signed)
[facility] [address] [place], [place], [address] Phone: [phone]   Fax:  [phone]  Physical Therapy Evaluation  Patient Details  Name: Veronica [name] MRN: [id number] Date of Birth: 19-Apr-1961 Referring Provider: Dr. Pollie Meyer  Encounter Date: 04/19/2015      PT End of Session - 04/19/15 1226    Visit Number 1   Number of Visits 9   Date for PT Re-Evaluation 05/19/15   Authorization Type [employer]: requires prior auth, waiting for approval   PT Start Time 0800   PT Stop Time 0845   PT Time Calculation (min) 45 min   Equipment Utilized During Treatment Gait belt   Activity Tolerance Patient tolerated treatment well   Behavior During Therapy [facility] for tasks assessed/performed      Past Medical History  Diagnosis Date  . Hypertension   . SLE (systemic lupus erythematosus) (HCC) 1998  . Headache(784.0)   . Hx of dizziness   . Arthritis   . History of blood transfusion     Past Surgical History  Procedure Laterality Date  . Abdominal hysterectomy  09/2008  . Cesarean section  1992, 1995  . Cholecystectomy N/A 07/18/2012    Procedure: LAPAROSCOPIC CHOLECYSTECTOMY WITH INTRAOPERATIVE CHOLANGIOGRAM;  Surgeon: Atilano Ina, MD;  Location: [facility] OR;  Service: General;  Laterality: N/A;  . Ercp N/A 07/29/2012    Procedure: ENDOSCOPIC RETROGRADE CHOLANGIOPANCREATOGRAPHY (ERCP);  Surgeon: Theda Belfast, MD;  Location: [facility];  Service: Endoscopy;  Laterality: N/A;  Dr. Elnoria Howard said he would start this PT arond 1330( AW)    There were no vitals filed for this visit.  Visit Diagnosis:  Dizziness and giddiness - Plan: PT plan of care cert/re-cert  Abnormality of gait - Plan: PT plan of care cert/re-cert      Subjective Assessment - 04/19/15 0805    Subjective Pt reports she has been experiencing dizziness for last 4 months. Pt reports MD told pt dizziness was caused by elevated BP, but she stills has HA's and  dizziness now that BP is under controlled. Pt reports she feels like she's moving during amb. Pt describes the dizziness, as she feels like she's moving. Pt reports dizziness is constant, at worst: 5/10, at best: 5/10.  Pt reports she ENT MD told pt "everthing seems to be normal".  Pt reported HA are intermittent.    Pertinent History Lupus, hx of headaches and dizziness, HTN   Patient Stated Goals Get dizziness resolved   Currently in Pain? No/denies            [facility] PT Assessment - 04/19/15 0809    Assessment   Medical Diagnosis Vertigo   Referring Provider Dr. Pollie Meyer   Onset Date/Surgical Date 12/18/14   Prior Therapy none   Precautions   Precautions Fall  based on FGA score   Restrictions   Weight Bearing Restrictions No   Balance Screen   Has the patient fallen in the past 6 months No   Has the patient had a decrease in activity level because of a fear of falling?  No   Is the patient reluctant to leave their home because of a fear of falling?  No   Home Nurse, mental health Private residence   Living Arrangements Spouse/significant other;Children   Available Help at Discharge Family   Type of Home House   Home Access Stairs to enter   Entrance Stairs-Number of Steps 4   Entrance Stairs-Rails Right  [facility] [address] [place], [place], [address] Phone: [phone]   Fax:  [phone]  Physical Therapy Evaluation  Patient Details  Name: Veronica [name] MRN: [id number] Date of Birth: 19-Apr-1961 Referring Provider: Dr. Pollie Meyer  Encounter Date: 04/19/2015      PT End of Session - 04/19/15 1226    Visit Number 1   Number of Visits 9   Date for PT Re-Evaluation 05/19/15   Authorization Type [employer]: requires prior auth, waiting for approval   PT Start Time 0800   PT Stop Time 0845   PT Time Calculation (min) 45 min   Equipment Utilized During Treatment Gait belt   Activity Tolerance Patient tolerated treatment well   Behavior During Therapy [facility] for tasks assessed/performed      Past Medical History  Diagnosis Date  . Hypertension   . SLE (systemic lupus erythematosus) (HCC) 1998  . Headache(784.0)   . Hx of dizziness   . Arthritis   . History of blood transfusion     Past Surgical History  Procedure Laterality Date  . Abdominal hysterectomy  09/2008  . Cesarean section  1992, 1995  . Cholecystectomy N/A 07/18/2012    Procedure: LAPAROSCOPIC CHOLECYSTECTOMY WITH INTRAOPERATIVE CHOLANGIOGRAM;  Surgeon: Atilano Ina, MD;  Location: [facility] OR;  Service: General;  Laterality: N/A;  . Ercp N/A 07/29/2012    Procedure: ENDOSCOPIC RETROGRADE CHOLANGIOPANCREATOGRAPHY (ERCP);  Surgeon: Theda Belfast, MD;  Location: [facility];  Service: Endoscopy;  Laterality: N/A;  Dr. Elnoria Howard said he would start this PT arond 1330( AW)    There were no vitals filed for this visit.  Visit Diagnosis:  Dizziness and giddiness - Plan: PT plan of care cert/re-cert  Abnormality of gait - Plan: PT plan of care cert/re-cert      Subjective Assessment - 04/19/15 0805    Subjective Pt reports she has been experiencing dizziness for last 4 months. Pt reports MD told pt dizziness was caused by elevated BP, but she stills has HA's and  dizziness now that BP is under controlled. Pt reports she feels like she's moving during amb. Pt describes the dizziness, as she feels like she's moving. Pt reports dizziness is constant, at worst: 5/10, at best: 5/10.  Pt reports she ENT MD told pt "everthing seems to be normal".  Pt reported HA are intermittent.    Pertinent History Lupus, hx of headaches and dizziness, HTN   Patient Stated Goals Get dizziness resolved   Currently in Pain? No/denies            [facility] PT Assessment - 04/19/15 0809    Assessment   Medical Diagnosis Vertigo   Referring Provider Dr. Pollie Meyer   Onset Date/Surgical Date 12/18/14   Prior Therapy none   Precautions   Precautions Fall  based on FGA score   Restrictions   Weight Bearing Restrictions No   Balance Screen   Has the patient fallen in the past 6 months No   Has the patient had a decrease in activity level because of a fear of falling?  No   Is the patient reluctant to leave their home because of a fear of falling?  No   Home Nurse, mental health Private residence   Living Arrangements Spouse/significant other;Children   Available Help at Discharge Family   Type of Home House   Home Access Stairs to enter   Entrance Stairs-Number of Steps 4   Entrance Stairs-Rails Right  SOT and provide balance HEP and gaze stabilization HEP to increase vestibular input.   Consulted and Agree with Plan of Care Patient         Problem List Patient Active Problem List   Diagnosis Date Noted  . Dizziness 03/11/2015  . Health care maintenance 02/21/2015  . Headache 11/12/2014  . Leg swelling 10/10/2014  . Insomnia 12/15/2012  . Resistant hypertension 04/10/2012  . SLE (systemic lupus erythematosus) (HCC) 04/10/2012    Kace Hartje L 04/19/2015, 12:43 PM  [facility] [address] [place], [place], [address] Phone: [phone]   Fax:  [phone]  Name: Veronica [name] MRN: [id number] Date of Birth: [birth year]   Zerita Boers, PT,DPT 04/19/2015 12:43 PM Phone: [phone] Fax: [phone]  [facility] [address] [place], [place], [address] Phone: [phone]   Fax:  [phone]  Physical Therapy Evaluation  Patient Details  Name: Veronica [name] MRN: [id number] Date of Birth: 19-Apr-1961 Referring Provider: Dr. Pollie Meyer  Encounter Date: 04/19/2015      PT End of Session - 04/19/15 1226    Visit Number 1   Number of Visits 9   Date for PT Re-Evaluation 05/19/15   Authorization Type [employer]: requires prior auth, waiting for approval   PT Start Time 0800   PT Stop Time 0845   PT Time Calculation (min) 45 min   Equipment Utilized During Treatment Gait belt   Activity Tolerance Patient tolerated treatment well   Behavior During Therapy [facility] for tasks assessed/performed      Past Medical History  Diagnosis Date  . Hypertension   . SLE (systemic lupus erythematosus) (HCC) 1998  . Headache(784.0)   . Hx of dizziness   . Arthritis   . History of blood transfusion     Past Surgical History  Procedure Laterality Date  . Abdominal hysterectomy  09/2008  . Cesarean section  1992, 1995  . Cholecystectomy N/A 07/18/2012    Procedure: LAPAROSCOPIC CHOLECYSTECTOMY WITH INTRAOPERATIVE CHOLANGIOGRAM;  Surgeon: Atilano Ina, MD;  Location: [facility] OR;  Service: General;  Laterality: N/A;  . Ercp N/A 07/29/2012    Procedure: ENDOSCOPIC RETROGRADE CHOLANGIOPANCREATOGRAPHY (ERCP);  Surgeon: Theda Belfast, MD;  Location: [facility];  Service: Endoscopy;  Laterality: N/A;  Dr. Elnoria Howard said he would start this PT arond 1330( AW)    There were no vitals filed for this visit.  Visit Diagnosis:  Dizziness and giddiness - Plan: PT plan of care cert/re-cert  Abnormality of gait - Plan: PT plan of care cert/re-cert      Subjective Assessment - 04/19/15 0805    Subjective Pt reports she has been experiencing dizziness for last 4 months. Pt reports MD told pt dizziness was caused by elevated BP, but she stills has HA's and  dizziness now that BP is under controlled. Pt reports she feels like she's moving during amb. Pt describes the dizziness, as she feels like she's moving. Pt reports dizziness is constant, at worst: 5/10, at best: 5/10.  Pt reports she ENT MD told pt "everthing seems to be normal".  Pt reported HA are intermittent.    Pertinent History Lupus, hx of headaches and dizziness, HTN   Patient Stated Goals Get dizziness resolved   Currently in Pain? No/denies            [facility] PT Assessment - 04/19/15 0809    Assessment   Medical Diagnosis Vertigo   Referring Provider Dr. Pollie Meyer   Onset Date/Surgical Date 12/18/14   Prior Therapy none   Precautions   Precautions Fall  based on FGA score   Restrictions   Weight Bearing Restrictions No   Balance Screen   Has the patient fallen in the past 6 months No   Has the patient had a decrease in activity level because of a fear of falling?  No   Is the patient reluctant to leave their home because of a fear of falling?  No   Home Nurse, mental health Private residence   Living Arrangements Spouse/significant other;Children   Available Help at Discharge Family   Type of Home House   Home Access Stairs to enter   Entrance Stairs-Number of Steps 4   Entrance Stairs-Rails Right

## 2015-04-19 NOTE — Telephone Encounter (Signed)
Left message on machine for pt to return call to the office.  

## 2015-04-19 NOTE — Telephone Encounter (Signed)
Reviewed MRI.  No LP needed.  The partially empty sella is often of no clinical significance in most people.

## 2015-04-19 NOTE — Telephone Encounter (Signed)
Called patient to discuss MRI results. MRI normal but did show partial empty sella, possibly associated with pseudotumor. Radiologist recommended considering CSF opening pressure. Discussed these results & recommendations with patient. She will contact her neurology office to discuss whether they recommend undergoing LP for opening pressure. Patient appreciative.  Latrelle Dodrill, MD

## 2015-04-19 NOTE — Telephone Encounter (Signed)
Patient had MRI done recently. Ordered by Loman Brooklyn. Dr. Ardeth Sportsman would like you to look at results, and would like to know if you would recommend her having a lumbar puncture. Please advise.     Callback [phone]

## 2015-05-03 ENCOUNTER — Ambulatory Visit: Payer: No Typology Code available for payment source | Attending: Family Medicine

## 2015-05-03 DIAGNOSIS — R269 Unspecified abnormalities of gait and mobility: Secondary | ICD-10-CM | POA: Insufficient documentation

## 2015-05-03 DIAGNOSIS — R42 Dizziness and giddiness: Secondary | ICD-10-CM | POA: Diagnosis not present

## 2015-05-03 NOTE — Patient Instructions (Signed)
Perform in corner with chair in front of you for safety OR at kitchen sink with chair behind you for safety:  Feet Apart (Compliant Surface) Varied Arm Positions - Eyes Closed    Stand on compliant surface: __pillow______ with feet shoulder width apart and arms at your side. Close eyes and visualize upright position. Hold_30___ seconds. Repeat __3__ times per session. Do __1__ sessions per day.  Copyright  VHI. All rights reserved.  Feet Apart (Compliant Surface) Head Motion - Eyes Open    With eyes open, standing on compliant surface: ___pillow_____, feet shoulder width apart, move head slowly: up and down 5 times and side to side 5 times. Repeat _3___ times per session. Do __1__ sessions per day.  Copyright  VHI. All rights reserved.  Feet Together, Varied Arm Positions - Eyes Closed    Stand with feet together and arms at your side. Close eyes and visualize upright position. Hold __30__ seconds. Repeat __3__ times per session. Do __1__ sessions per day.  Copyright  VHI. All rights reserved.

## 2015-05-03 NOTE — Therapy (Signed)
[facility] [address] [place], [place], [address] Phone: [phone]   Fax:  [phone]  Physical Therapy Treatment  Patient Details  Name: Veronica [name] MRN: [id number] Date of Birth: November 02, 1960 Referring Provider: Dr. Pollie Meyer  Encounter Date: 05/03/2015      PT End of Session - 05/03/15 0852    Visit Number 2   Number of Visits 9   Date for PT Re-Evaluation 05/19/15   Authorization Type [employer]: requires prior auth, waiting for approval-schedule states checked by LL   PT Start Time 0807  pt arrived late   PT Stop Time 0846   PT Time Calculation (min) 39 min   Equipment Utilized During Treatment --  SOT harness   Activity Tolerance Patient tolerated treatment well   Behavior During Therapy [facility] for tasks assessed/performed      Past Medical History  Diagnosis Date  . Hypertension   . SLE (systemic lupus erythematosus) (HCC) 1998  . Headache(784.0)   . Hx of dizziness   . Arthritis   . History of blood transfusion     Past Surgical History  Procedure Laterality Date  . Abdominal hysterectomy  09/2008  . Cesarean section  1992, 1995  . Cholecystectomy N/A 07/18/2012    Procedure: LAPAROSCOPIC CHOLECYSTECTOMY WITH INTRAOPERATIVE CHOLANGIOGRAM;  Surgeon: Atilano Ina, MD;  Location: [facility] OR;  Service: General;  Laterality: N/A;  . Ercp N/A 07/29/2012    Procedure: ENDOSCOPIC RETROGRADE CHOLANGIOPANCREATOGRAPHY (ERCP);  Surgeon: Theda Belfast, MD;  Location: [facility];  Service: Endoscopy;  Laterality: N/A;  Dr. Elnoria Howard said he would start this PT arond 1330( AW)    There were no vitals filed for this visit.  Visit Diagnosis:  Dizziness and giddiness  Abnormality of gait      Subjective Assessment - 05/03/15 0808    Subjective (p) Pt reported dizziness was worse for 3 days around Christmas, and reports she had to hold onto shopping cart to maintain balance. Pt describes dizziness as if "I was moving  inside". Dizziness ranged between 5-9/10 at that time.   Pertinent History (p) Lupus, hx of headaches and dizziness, HTN   Patient Stated Goals (p) Get dizziness resolved   Currently in Pain? (p) No/denies      Neuro re-ed: Neuro re-ed: sensory organization test performed with following results: Conditions: 1:  WNL 2:  3 trials below normal limits 3:  2 trials below normal and 1 trial WNL 4: 3 trials below normal limits 5: 3 "falls" 6: 3 "falls" Composite score:  43 Sensory Analysis Som: Below normal, approx. 71 Vis: Below normal, approx. 60 Vest: Below normal, approx. 0 Pref: WNL, however, vestibular input approx. 0, which ratio does not take into account. Strategy analysis:    Decreased hip strategy during conditions 5 and 6.   COG alignment:      WNL, with slight lateral wt. Shift to the Left.  Pt performed HEP in corner with chair and supervision for safety. Cues for technique. Please see pt instructions for details.                           PT Education - 05/03/15 0851    Education provided Yes   Education Details PT discussed SOT findings and educated pt on balance HEP to improve vestibular input   Person(s) Educated Patient   Methods Explanation;Demonstration;Verbal cues;Handout   Comprehension Returned demonstration;Verbalized understanding  PT Short Term Goals - 04/19/15 1231    PT SHORT TERM GOAL #1   Title same as LTGs           PT Long Term Goals - 05/03/15 1052    PT LONG TERM GOAL #1   Title Pt will report 0/10 dizziness during functional activities, including amb., to improve safety during functional mobility. Target date: 05/16/14   Status On-going   PT LONG TERM GOAL #2   Title Pt will improve FGA score to >/=25/30 to decr. falls risk. Target date: 05/16/14   Status On-going   PT LONG TERM GOAL #3   Title Pt will be IND in HEP to improve balance and decrease dizziness. Target date: 05/16/14   Status On-going   PT  LONG TERM GOAL #4   Title Pt will amb. 1000' over even/uneven terrain, while performing head turns without dizziness, IND to improve functional mobility. Target date: 05/16/14   Status On-going   PT LONG TERM GOAL #5   Title Pt will improve DHI score from 56 to 38 to improve quality of life. Target date: 05/16/14   Status On-going   Additional Long Term Goals   Additional Long Term Goals Yes   PT LONG TERM GOAL #6   Title Pt will improve SOT score to >/=70 to improve balance and input from vestibular, visual and somatosensory systems to decr. dizziness. Target date: 05/16/17   Status New               Plan - 05/03/15 0854    Clinical Impression Statement Pt's SOT indicates pt has decreased input from all 3 balance systems: somatosensory, visual, and vestibular compared to her age group. Pt's vestibular system input is approx. 0, which indicates vestibular hypofunctioning and to incr. hip strategy during external pertubations. Pt would benefit from skilled PT to improve input from all 3 balance systems, especially vestibular to decrease falls risk and dizziness.    Pt will benefit from skilled therapeutic intervention in order to improve on the following deficits Abnormal gait;Dizziness;Decreased balance;Decreased knowledge of use of DME   Rehab Potential Good   PT Frequency 2x / week   PT Duration 4 weeks   PT Treatment/Interventions ADLs/Self Care Home Management;Biofeedback;Canalith Repostioning;Balance training;Therapeutic exercise;Therapeutic activities;Manual techniques;Vestibular;Functional mobility training;Stair training;Gait training;DME Instruction;Patient/family education;Neuromuscular re-education   PT Next Visit Plan Dynamic gait and balance training.   Consulted and Agree with Plan of Care Patient        Problem List Patient Active Problem List   Diagnosis Date Noted  . Dizziness 03/11/2015  . Health care maintenance 02/21/2015  . Headache 11/12/2014  . Leg  swelling 10/10/2014  . Insomnia 12/15/2012  . Resistant hypertension 04/10/2012  . SLE (systemic lupus erythematosus) (HCC) 04/10/2012    Miller,Jennifer L 05/03/2015, 10:55 AM  [facility] [address] [place], [place], [address] Phone: [phone]   Fax:  [phone]  Name: LISANIA MANTEGNA MRN: [id number] Date of Birth: 20-Mar-1961   Zerita Boers, PT,DPT 05/03/2015 10:55 AM Phone: [phone] Fax: [phone]  Zerita Boers, PT,DPT 05/03/2015 10:55 AM Phone: [phone] Fax: [phone]

## 2015-05-06 ENCOUNTER — Telehealth: Payer: Self-pay | Admitting: Family Medicine

## 2015-05-06 DIAGNOSIS — I10 Essential (primary) hypertension: Secondary | ICD-10-CM

## 2015-05-06 NOTE — Telephone Encounter (Signed)
Pt called and needs a refill on her [employer] called in . jw

## 2015-05-07 MED ORDER — CARVEDILOL 6.25 MG PO TABS: 6.2500 mg | ORAL_TABLET | Freq: Two times a day (BID) | ORAL | Status: DC

## 2015-05-10 ENCOUNTER — Ambulatory Visit: Payer: No Typology Code available for payment source

## 2015-05-10 DIAGNOSIS — R42 Dizziness and giddiness: Secondary | ICD-10-CM | POA: Diagnosis not present

## 2015-05-10 DIAGNOSIS — R269 Unspecified abnormalities of gait and mobility: Secondary | ICD-10-CM

## 2015-05-10 NOTE — Therapy (Signed)
[facility] [address] [place], [place], [address] Phone: [phone]   Fax:  [phone]  Physical Therapy Treatment  Patient Details  Name: Veronica [name] MRN: [id number] Date of Birth: 1961-01-14 Referring Provider: Dr. Pollie Meyer  Encounter Date: 05/10/2015      PT End of Session - 05/10/15 0855    Visit Number 3   Number of Visits 9  approved for 6 visits-emailed Lisa to confirm if eval in included in total   Date for PT Re-Evaluation 05/19/15   Authorization Type [employer]: per Royetta Crochet approved 6 visits   PT Start Time 0815  pt late 2/2 weather   PT Stop Time 0842   PT Time Calculation (min) 27 min   Equipment Utilized During Treatment Gait belt   Activity Tolerance Patient tolerated treatment well   Behavior During Therapy [facility] for tasks assessed/performed      Past Medical History  Diagnosis Date  . Hypertension   . SLE (systemic lupus erythematosus) (HCC) 1998  . Headache(784.0)   . Hx of dizziness   . Arthritis   . History of blood transfusion     Past Surgical History  Procedure Laterality Date  . Abdominal hysterectomy  09/2008  . Cesarean section  1992, 1995  . Cholecystectomy N/A 07/18/2012    Procedure: LAPAROSCOPIC CHOLECYSTECTOMY WITH INTRAOPERATIVE CHOLANGIOGRAM;  Surgeon: Atilano Ina, MD;  Location: [facility] OR;  Service: General;  Laterality: N/A;  . Ercp N/A 07/29/2012    Procedure: ENDOSCOPIC RETROGRADE CHOLANGIOPANCREATOGRAPHY (ERCP);  Surgeon: Theda Belfast, MD;  Location: [facility];  Service: Endoscopy;  Laterality: N/A;  Dr. Elnoria Howard said he would start this PT arond 1330( AW)    There were no vitals filed for this visit.  Visit Diagnosis:  Dizziness and giddiness  Abnormality of gait      Subjective Assessment - 05/10/15 0816    Subjective Pt reported she was able to play with grand dtr in the snow over the weekend but dizziness still present. Pt reports dizziness currently  2/10.    Pertinent History Lupus, hx of headaches and dizziness, HTN   Patient Stated Goals Get dizziness resolved   Currently in Pain? No/denies                              Balance Exercises - 05/10/15 0852    Balance Exercises: Standing   Rockerboard Anterior/posterior;Lateral;Head turns;EO;EC;30 seconds;10 reps;Intermittent UE support   Gait with Head Turns Forward;Intermittent upper extremity support;Other reps (comment)  4-6 reps (not on beam, non-complaint surface only).   Tandem Gait Forward;Intermittent upper extremity support;4 reps   Retro Gait 4 reps  intermittent UE support   Other Standing Exercises Pt performed at counter (gait) and in // bars (rockerboard) with cues for technique and intermittent UE support for safety. Pt experienced 7 LOB episodes with UE support and min A to correct balance.  Pt also amb. 117'x2 while performing Head turns prior to and after balance training with no LOB.           PT Education - 05/10/15 0855    Education provided Yes   Education Details PT discussed the importance of notifying PT when HEP becomes easy, so PT can progress HEP to improve balance and reduce dizziness.   Person(s) Educated Patient   Methods Explanation   Comprehension Verbalized understanding          PT Short Term Goals -  04/19/15 1231    PT SHORT TERM GOAL #1   Title same as LTGs           PT Long Term Goals - 05/10/15 0900    PT LONG TERM GOAL #1   Title Pt will report 0/10 dizziness during functional activities, including amb., to improve safety during functional mobility. Target date: 05/16/14   Status On-going   PT LONG TERM GOAL #2   Title Pt will improve FGA score to >/=25/30 to decr. falls risk. Target date: 05/16/14   Status On-going   PT LONG TERM GOAL #3   Title Pt will be IND in HEP to improve balance and decrease dizziness. Target date: 05/16/14   Status On-going   PT LONG TERM GOAL #4   Title Pt will amb. 1000' over  even/uneven terrain, while performing head turns without dizziness, IND to improve functional mobility. Target date: 05/16/14   Status On-going   PT LONG TERM GOAL #5   Title Pt will improve DHI score from 56 to 38 to improve quality of life. Target date: 05/16/14   Status On-going   PT LONG TERM GOAL #6   Title Pt will improve SOT score to >/=70 to improve balance and input from vestibular, visual and somatosensory systems to decr. dizziness. Target date: 05/16/17   Status On-going               Plan - 05/10/15 0856    Clinical Impression Statement Pt demonstrated progress as she was able to amb. while performing head turns after balance activities without gait deviations. Pt continues to experience LOB during high level  balance activties which require incr. vestibular input. Continue with POC.   Pt will benefit from skilled therapeutic intervention in order to improve on the following deficits Abnormal gait;Dizziness;Decreased balance;Decreased knowledge of use of DME   Rehab Potential Good   PT Frequency 2x / week   PT Duration 4 weeks   PT Treatment/Interventions ADLs/Self Care Home Management;Biofeedback;Canalith Repostioning;Balance training;Therapeutic exercise;Therapeutic activities;Manual techniques;Vestibular;Functional mobility training;Stair training;Gait training;DME Instruction;Patient/family education;Neuromuscular re-education   PT Next Visit Plan Dynamic gait and balance training on compliant surfaces with focus on incr. vestibular input   PT Home Exercise Plan Balance HEP   Consulted and Agree with Plan of Care Patient        Problem List Patient Active Problem List   Diagnosis Date Noted  . Dizziness 03/11/2015  . Health care maintenance 02/21/2015  . Headache 11/12/2014  . Leg swelling 10/10/2014  . Insomnia 12/15/2012  . Resistant hypertension 04/10/2012  . SLE (systemic lupus erythematosus) (HCC) 04/10/2012    Veronica [name] 05/10/2015, 9:01  AM  [facility] [address] [place], [place], [address] Phone: [phone]   Fax:  [phone]  Name: Veronica [name] MRN: [id number] Date of Birth: 22-May-1960    Zerita Boers, PT,DPT 05/10/2015 9:01 AM Phone: [phone] Fax: [phone]

## 2015-05-13 ENCOUNTER — Ambulatory Visit: Payer: No Typology Code available for payment source

## 2015-05-17 ENCOUNTER — Ambulatory Visit: Payer: No Typology Code available for payment source | Admitting: Physical Therapy

## 2015-05-17 ENCOUNTER — Encounter: Payer: Self-pay | Admitting: Physical Therapy

## 2015-05-17 DIAGNOSIS — R42 Dizziness and giddiness: Secondary | ICD-10-CM

## 2015-05-17 DIAGNOSIS — R269 Unspecified abnormalities of gait and mobility: Secondary | ICD-10-CM

## 2015-05-17 NOTE — Patient Instructions (Signed)
Feet Together (Compliant Surface) Head Motion - Eyes Closed    Stand on compliant surface: _pillow_ with feet together. Close eyes and move head: 1. Slowly, up and down. 2. Slowly, left and right 3. Slowly, diagonals both ways Repeat _10_ times per session. Do _1-2___ sessions per day.  Copyright  VHI. All rights reserved.  Feet Together (Compliant Surface) Head Motion - Eyes Closed    Stand on compliant surface: ________ with feet together. Close eyes and move head slowly, up and down. Repeat ____ times per session. Do ____ sessions per day.  Copyright  VHI. All rights reserved.  Carpeted Surface With Up / Down Head Motion    Perform without assistive device. Walking on carpet, move head and eyes toward ceiling for __3__ steps.  Then, move head and eyes toward floor for __3__ steps. Repeat sequence _4__ times per session. Do __1-2__ sessions per day.   Copyright  VHI. All rights reserved.  Carpeted Surface With Side to Side Head Motion    Perform without assistive device. Walking on carpet, turn head and eyes left for _3_ steps. Then, turn head and eyes to opposite side for __3__ steps. Repeat sequence _4___ times per session. Do _1-2___ sessions per day.  Copyright  VHI. All rights reserved.  Carpeted Surface With Side to Side Head Motion    Perform without assistive device. Walking on carpet, turn head and eyes left for ____ steps. Then, turn head and eyes straight ahead for ____ steps. Then, turn head and eyes to opposite side for ____ steps. Repeat sequence ____ times per session. Do ____ sessions per day. Repeat in dimly lit room.  Copyright  VHI. All rights reserved.  Carpeted Surface With Diagonal Head Motion    Perform without assistive device. Walking on carpet, move head and eyes up to left for __3__ steps.  Then, move head and eyes down to opposite side for _3___ steps. Repeat sequence __4__ times each combination of directions. Do _1-2___ sessions  per day.   Copyright  VHI. All rights reserved.

## 2015-05-18 NOTE — Therapy (Signed)
[facility] [address] [place], [place], [address] Phone: [phone]   Fax:  [phone]  Physical Therapy Treatment  Patient Details  Name: Veronica [name] MRN: [id number] Date of Birth: 09-10-1960 Referring Provider: Dr. Pollie Meyer  Encounter Date: 05/17/2015      PT End of Session - 05/17/15 0811    Visit Number 4   Number of Visits 9  approved for 6 visits-emailed Lisa to confirm if eval in included in total   Date for PT Re-Evaluation 05/19/15   Authorization Type [employer]: per Royetta Crochet approved 6 visits   PT Start Time 0807   PT Stop Time 0845   PT Time Calculation (min) 38 min   Equipment Utilized During Treatment Gait belt   Activity Tolerance Patient tolerated treatment well   Behavior During Therapy [facility] for tasks assessed/performed      Past Medical History  Diagnosis Date  . Hypertension   . SLE (systemic lupus erythematosus) (HCC) 1998  . Headache(784.0)   . Hx of dizziness   . Arthritis   . History of blood transfusion     Past Surgical History  Procedure Laterality Date  . Abdominal hysterectomy  09/2008  . Cesarean section  1992, 1995  . Cholecystectomy N/A 07/18/2012    Procedure: LAPAROSCOPIC CHOLECYSTECTOMY WITH INTRAOPERATIVE CHOLANGIOGRAM;  Surgeon: Atilano Ina, MD;  Location: [facility] OR;  Service: General;  Laterality: N/A;  . Ercp N/A 07/29/2012    Procedure: ENDOSCOPIC RETROGRADE CHOLANGIOPANCREATOGRAPHY (ERCP);  Surgeon: Theda Belfast, MD;  Location: [facility];  Service: Endoscopy;  Laterality: N/A;  Dr. Elnoria Howard said he would start this PT arond 1330( AW)    There were no vitals filed for this visit.  Visit Diagnosis:  Dizziness and giddiness  Abnormality of gait      Subjective Assessment - 05/17/15 0809    Subjective Does not feel the dizziness is getting any better, some days are better than others. Had an episode in grocery store the other day where she felt like she was  going to pass out,    Pertinent History Lupus, hx of headaches and dizziness, HTN   Patient Stated Goals Get dizziness resolved   Currently in Pain? No/denies             Balance Exercises - 05/17/15 0830    Balance Exercises: Standing   Standing Eyes Closed Narrow base of support (BOS);Foam/compliant surface;Other reps (comment);Limitations;Head turns   Tandem Stance Eyes closed;Foam/compliant surface;2 reps;15 secs   Rockerboard Anterior/posterior;Lateral;Head turns;EC;10 reps;10 seconds;Other (comment)   Gait with Head Turns Forward;4 reps;Limitations   Tandem Gait Forward;Foam/compliant surface;Other reps (comment);Limitations   Other Standing Exercises in long hallway: forward gait with head turns, head nods and head diagonals. added to HEP.                            Balance Exercises: Standing   Standing Eyes Closed Limitations airex: narrow base of support with eyes closed for head nods/shakes/ diagonals both ways; tandem stance: eyes closed no head movements, x 2 reps each foot forward. min to min guard assist for balance.                                      Rebounder Limitations balance board both ways: eyes closed no head movements, eyes open head nods/turns/diagonals both ways. min to  min guard assist for balance.                                     Tandem Gait Limitations on blue mat, min guard to min assist with occasional UE touch to counter top for balance.           PT Education - 05/17/15 0829    Education provided Yes   Education Details HEP: advanced corner balance and added dynamic gait (head movements) in hallway   Person(s) Educated Patient   Methods Explanation;Demonstration;Handout;Verbal cues   Comprehension Verbalized understanding;Returned demonstration          PT Short Term Goals - 04/19/15 1231    PT SHORT TERM GOAL #1   Title same as LTGs           PT Long Term Goals - 05/10/15 0900    PT LONG TERM GOAL #1   Title Pt will report  0/10 dizziness during functional activities, including amb., to improve safety during functional mobility. Target date: 05/16/14   Status On-going   PT LONG TERM GOAL #2   Title Pt will improve FGA score to >/=25/30 to decr. falls risk. Target date: 05/16/14   Status On-going   PT LONG TERM GOAL #3   Title Pt will be IND in HEP to improve balance and decrease dizziness. Target date: 05/16/14   Status On-going   PT LONG TERM GOAL #4   Title Pt will amb. 1000' over even/uneven terrain, while performing head turns without dizziness, IND to improve functional mobility. Target date: 05/16/14   Status On-going   PT LONG TERM GOAL #5   Title Pt will improve DHI score from 56 to 38 to improve quality of life. Target date: 05/16/14   Status On-going   PT LONG TERM GOAL #6   Title Pt will improve SOT score to >/=70 to improve balance and input from vestibular, visual and somatosensory systems to decr. dizziness. Target date: 05/16/17   Status On-going               Plan - 05/17/15 0811    Clinical Impression Statement Advanced pt's HEP today without any issues reported. Continued to work on high level/dynamic balance activiites without any issues reported. Pt is making steady progress toward goals.   Pt will benefit from skilled therapeutic intervention in order to improve on the following deficits Abnormal gait;Dizziness;Decreased balance;Decreased knowledge of use of DME   Rehab Potential Good   PT Frequency 2x / week   PT Duration 4 weeks   PT Treatment/Interventions ADLs/Self Care Home Management;Biofeedback;Canalith Repostioning;Balance training;Therapeutic exercise;Therapeutic activities;Manual techniques;Vestibular;Functional mobility training;Stair training;Gait training;DME Instruction;Patient/family education;Neuromuscular re-education   PT Next Visit Plan assess LTGs   PT Home Exercise Plan Balance HEP   Consulted and Agree with Plan of Care Patient        Problem List Patient  Active Problem List   Diagnosis Date Noted  . Dizziness 03/11/2015  . Health care maintenance 02/21/2015  . Headache 11/12/2014  . Leg swelling 10/10/2014  . Insomnia 12/15/2012  . Resistant hypertension 04/10/2012  . SLE (systemic lupus erythematosus) (HCC) 04/10/2012    Sallyanne Kuster 05/18/2015, 2:04 PM  Sallyanne Kuster, PTA, [facility] [address] [place], [place] [address] [phone] 05/18/2015, 2:04 PM   Name: Veronica [name] MRN: [id number] Date of Birth: 1960/10/21

## 2015-05-19 ENCOUNTER — Ambulatory Visit: Payer: No Typology Code available for payment source

## 2015-05-19 DIAGNOSIS — R42 Dizziness and giddiness: Secondary | ICD-10-CM

## 2015-05-19 DIAGNOSIS — R269 Unspecified abnormalities of gait and mobility: Secondary | ICD-10-CM

## 2015-05-19 NOTE — Therapy (Signed)
[facility] [address] [place], [place], [address] Phone: [phone]   Fax:  [phone]  Physical Therapy Treatment  Patient Details  Name: Veronica [name] MRN: [id number] Date of Birth: Nov 03, 1960 Referring Provider: Dr. Pollie Meyer  Encounter Date: 05/19/2015      PT End of Session - 05/19/15 1041    Visit Number 5   Number of Visits 9  approved for 8 visits per Lisa's updated email   Authorization Type [employer]: per Royetta Crochet approved 8 visits (per Lisa's updated email)   Authorization - Visit Number 5   Authorization - Number of Visits 8   PT Start Time 0806   PT Stop Time 0848   PT Time Calculation (min) 42 min   Equipment Utilized During Treatment Gait belt  SOT harness   Activity Tolerance Patient tolerated treatment well   Behavior During Therapy [facility] for tasks assessed/performed      Past Medical History  Diagnosis Date  . Hypertension   . SLE (systemic lupus erythematosus) (HCC) 1998  . Headache(784.0)   . Hx of dizziness   . Arthritis   . History of blood transfusion     Past Surgical History  Procedure Laterality Date  . Abdominal hysterectomy  09/2008  . Cesarean section  1992, 1995  . Cholecystectomy N/A 07/18/2012    Procedure: LAPAROSCOPIC CHOLECYSTECTOMY WITH INTRAOPERATIVE CHOLANGIOGRAM;  Surgeon: Atilano Ina, MD;  Location: [facility] OR;  Service: General;  Laterality: N/A;  . Ercp N/A 07/29/2012    Procedure: ENDOSCOPIC RETROGRADE CHOLANGIOPANCREATOGRAPHY (ERCP);  Surgeon: Theda Belfast, MD;  Location: [facility];  Service: Endoscopy;  Laterality: N/A;  Dr. Elnoria Howard said he would start this PT arond 1330( AW)    There were no vitals filed for this visit.  Visit Diagnosis:  Dizziness and giddiness  Abnormality of gait      Subjective Assessment - 05/19/15 0812    Subjective Pt reports she feels better on some days, she reports dizziness is 5/10 on average, at best: 3/10, at worst:  5/10. Pt reported 0/10 dizziness while at work yesterday. Pt has not gone to the grocery store since last visit. Pt reports she occasionally feels L ear pain, pt is not sure what brings on the pain.   Pertinent History Lupus, hx of headaches and dizziness, HTN   Patient Stated Goals Get dizziness resolved   Currently in Pain? No/denies            [facility] PT Assessment - 05/19/15 0837    Functional Gait  Assessment   Gait assessed  Yes   Gait Level Surface Walks 20 ft in less than 5.5 sec, no assistive devices, good speed, no evidence for imbalance, normal gait pattern, deviates no more than 6 in outside of the 12 in walkway width.   Change in Gait Speed Able to smoothly change walking speed without loss of balance or gait deviation. Deviate no more than 6 in outside of the 12 in walkway width.   Gait with Horizontal Head Turns Performs head turns smoothly with slight change in gait velocity (eg, minor disruption to smooth gait path), deviates 6-10 in outside 12 in walkway width, or uses an assistive device.   Gait with Vertical Head Turns Performs task with slight change in gait velocity (eg, minor disruption to smooth gait path), deviates 6 - 10 in outside 12 in walkway width or uses assistive device   Gait and Pivot Turn Pivot turns safely within 3 sec  and stops quickly with no loss of balance.   Step Over Obstacle Is able to step over 2 stacked shoe boxes taped together (9 in total height) without changing gait speed. No evidence of imbalance.   Gait with Narrow Base of Support Is able to ambulate for 10 steps heel to toe with no staggering.   Gait with Eyes Closed Walks 20 ft, uses assistive device, slower speed, mild gait deviations, deviates 6-10 in outside 12 in walkway width. Ambulates 20 ft in less than 9 sec but greater than 7 sec.   Ambulating Backwards Walks 20 ft, uses assistive device, slower speed, mild gait deviations, deviates 6-10 in outside 12 in walkway width.   Steps  Alternating feet, no rail.   Total Score 26      Neuro re-ed: sensory organization test performed with following results: Conditions: 1:  WNL 2:  WNL 3:  WNL 4: Two trials slightly below normal limits, and one trial WNL 5: Two trials below normal limits and one fall 6: Two falls and one trial below normal limits. Composite score:  57 Sensory Analysis Som: WNL Vis: WNL Vest: Below normal, aapprox. 35 Pref: WNL Strategy analysis:      WNL, except for decreased hip strategy during conditions five and six. COG alignment:      WNL, and slightly anterior.                          PT Education - 05/19/15 1040    Education provided Yes   Education Details PT discussed goal progress, FGA and SOT results. PT discussed adding add'l visits to improve vestibular input.   Person(s) Educated Patient   Methods Explanation   Comprehension Verbalized understanding          PT Short Term Goals - 04/19/15 1231    PT SHORT TERM GOAL #1   Title same as LTGs           PT Long Term Goals - 05/19/15 0817    PT LONG TERM GOAL #1   Title Pt will report 0/10 dizziness during functional activities, including amb., to improve safety during functional mobility. Target date: 05/16/14   Baseline All unmet goals will be carrived to new POC: 06/18/15   Status Partially Met   PT LONG TERM GOAL #2   Title Pt will improve FGA score to >/=25/30 to decr. falls risk. Target date: 05/16/14   Baseline 26/30   Status Achieved   PT LONG TERM GOAL #3   Title Pt will be IND in HEP to improve balance and decrease dizziness. Target date: 05/16/14   Status On-going   PT LONG TERM GOAL #4   Title Pt will amb. 1000' over even/uneven terrain, while performing head turns without dizziness, IND to improve functional mobility. Target date: 05/16/14   Status On-going   PT LONG TERM GOAL #5   Title Pt will improve DHI score from 56 to 38 to improve quality of life. Target date: 05/16/14   Status  Deferred  will assess at d/c   PT LONG TERM GOAL #6   Title Pt will improve SOT score to >/=70 to improve balance and input from vestibular, visual and somatosensory systems to decr. dizziness. Target date: 05/16/17   Baseline 57   Status Partially Met               Plan - 05/19/15 1044    Clinical Impression Statement Pt partially met LTG  1, 3, and 6. Pt met LTG 2 and did not meet LTG 4. Pt continues to make progress, as she demonstrated improvements on FGA and SOT scores. SOT findings continue to be consistent with decr. vestibular input. Therefore, pt would continue to benefit from skilled PT to improve safety during functional mobility. PT now requesting add'l visits 1x/week for 4 weeks.   Pt will benefit from skilled therapeutic intervention in order to improve on the following deficits Abnormal gait;Dizziness;Decreased balance;Decreased knowledge of use of DME   Rehab Potential Good   PT Frequency 2x / week   PT Duration 4 weeks   PT Treatment/Interventions ADLs/Self Care Home Management;Biofeedback;Canalith Repostioning;Balance training;Therapeutic exercise;Therapeutic activities;Manual techniques;Vestibular;Functional mobility training;Stair training;Gait training;DME Instruction;Patient/family education;Neuromuscular re-education   PT Next Visit Plan Dyanmic gait and balance training over uneven surfaces.   PT Home Exercise Plan Balance HEP   Consulted and Agree with Plan of Care Patient        Problem List Patient Active Problem List   Diagnosis Date Noted  . Dizziness 03/11/2015  . Health care maintenance 02/21/2015  . Headache 11/12/2014  . Leg swelling 10/10/2014  . Insomnia 12/15/2012  . Resistant hypertension 04/10/2012  . SLE (systemic lupus erythematosus) (HCC) 04/10/2012    Shala Baumbach L 05/19/2015, 10:47 AM  [facility] [address] [place], [place], [address] Phone: [phone]   Fax:   [phone]  Name: Veronica [name] MRN: [id number] Date of Birth: 30-Jan-1961    Zerita Boers, PT,DPT 05/19/2015 10:47 AM Phone: [phone] Fax: [phone]

## 2015-05-20 ENCOUNTER — Ambulatory Visit: Payer: No Typology Code available for payment source

## 2015-05-23 ENCOUNTER — Encounter: Payer: No Typology Code available for payment source | Admitting: Physical Therapy

## 2015-05-24 ENCOUNTER — Ambulatory Visit: Payer: No Typology Code available for payment source

## 2015-05-24 DIAGNOSIS — R269 Unspecified abnormalities of gait and mobility: Secondary | ICD-10-CM

## 2015-05-24 DIAGNOSIS — R42 Dizziness and giddiness: Secondary | ICD-10-CM

## 2015-05-24 NOTE — Therapy (Signed)
[facility] [address] [place], [place], [address] Phone: [phone]   Fax:  [phone]  Physical Therapy Treatment  Patient Details  Name: Veronica [name] MRN: [id number] Date of Birth: [birth year] Referring Provider: Dr. Pollie Meyer  Encounter Date: 05/24/2015      PT End of Session - 05/24/15 1438    Visit Number 6   Number of Visits 9   Date for PT Re-Evaluation 06/18/15   Authorization Type [employer]: per Royetta Crochet approved 8 visits (per Lisa's updated email)   Authorization - Visit Number 6   Authorization - Number of Visits 8   PT Start Time [phone]  pt arrived late   PT Stop Time 0844   PT Time Calculation (min) 38 min   Equipment Utilized During Treatment Gait belt   Activity Tolerance Patient tolerated treatment well   Behavior During Therapy [facility] for tasks assessed/performed      Past Medical History  Diagnosis Date  . Hypertension   . SLE (systemic lupus erythematosus) (HCC) 1998  . Headache(784.0)   . Hx of dizziness   . Arthritis   . History of blood transfusion     Past Surgical History  Procedure Laterality Date  . Abdominal hysterectomy  09/2008  . Cesarean section  1992, 1995  . Cholecystectomy N/A 07/18/2012    Procedure: LAPAROSCOPIC CHOLECYSTECTOMY WITH INTRAOPERATIVE CHOLANGIOGRAM;  Surgeon: Atilano Ina, MD;  Location: [facility] OR;  Service: General;  Laterality: N/A;  . Ercp N/A 07/29/2012    Procedure: ENDOSCOPIC RETROGRADE CHOLANGIOPANCREATOGRAPHY (ERCP);  Surgeon: Theda Belfast, MD;  Location: [facility];  Service: Endoscopy;  Laterality: N/A;  Dr. Elnoria Howard said he would start this PT arond 1330( AW)    There were no vitals filed for this visit.  Visit Diagnosis:  Abnormality of gait  Dizziness and giddiness      Subjective Assessment - 05/24/15 0807    Subjective Pt denied falls or changes since last visit. Pt reported she feels "fine". Pt reported dizziness is about the same.    Pertinent History Lupus, hx of headaches and dizziness, HTN   Patient Stated Goals Get dizziness resolved   Currently in Pain? No/denies                         [facility] Adult PT Treatment/Exercise - 05/24/15 0809           High Level Balance   High Level Balance Activities Side stepping;Backward walking;Head turns;Marching forwards;Other (comment)  forward with eyes closed   High Level Balance Comments On blue and red mats, pt performed x4 reps/activity (15' length). Pt also amb. with bean bags placed under mats (forward amb. only). Pt performed cone taps/knock downs x6 cones with B LEs (x2 reps/activity). Cues to improve lateral wt. shifting.             Balance Exercises - 05/24/15 1436    Balance Exercises: Standing   Rockerboard Anterior/posterior;Lateral;Head turns;EO;EC;30 seconds;10 reps  no UE support with min guard for safety.    Other Standing Exercises In // bars: rockerboard and foam mat, pt performed the activities listed in rockerboard section plus tossing bean bas into basket after squatting to retrieve bags with feet apart and together x20 reps in each stance. pt also performed ball/toss standing on rockerboard with min guard to min A. Pt reported 1-2/10 dizziness after tossing ball at end of session.  PT Short Term Goals - 04/19/15 1231    PT SHORT TERM GOAL #1   Title same as LTGs           PT Long Term Goals - 05/24/15 1440    PT LONG TERM GOAL #1   Title Pt will report 0/10 dizziness during functional activities, including amb., to improve safety during functional mobility. Target date: 06/18/15   Baseline All unmet goals will be carrived to new POC: 06/18/15   Status On-going   PT LONG TERM GOAL #2   Title Pt will improve FGA score to >/=25/30 to decr. falls risk. Target date: 06/18/15   Baseline 26/30   Status Achieved   PT LONG TERM GOAL #3   Title Pt will be IND in HEP to improve balance and decrease dizziness. Target  date: 06/18/15   Status On-going   PT LONG TERM GOAL #4   Title Pt will amb. 1000' over even/uneven terrain, while performing head turns without dizziness, IND to improve functional mobility. Target date: 06/18/15   Status On-going   PT LONG TERM GOAL #5   Title Pt will improve DHI score from 56 to 38 to improve quality of life. Target date: 06/18/15   Status Deferred  will assess at d/c   PT LONG TERM GOAL #6   Title Pt will improve SOT score to >/=70 to improve balance and input from vestibular, visual and somatosensory systems to decr. dizziness. Target date: 05/16/17   Baseline 57   Status Partially Met               Plan - 05/24/15 1439    Clinical Impression Statement Pt demonstrated progress as she was able to perform high level balance activities over compliant surfaces. Pt continues to demonstrate incr. postural sway and intermittent LOB during activities with eye closed on compliant surfaces, indicating decr. vestibular input. Continue with POC.   Pt will benefit from skilled therapeutic intervention in order to improve on the following deficits Abnormal gait;Dizziness;Decreased balance;Decreased knowledge of use of DME   Rehab Potential Good   PT Frequency 2x / week   PT Duration 4 weeks   PT Treatment/Interventions ADLs/Self Care Home Management;Biofeedback;Canalith Repostioning;Balance training;Therapeutic exercise;Therapeutic activities;Manual techniques;Vestibular;Functional mobility training;Stair training;Gait training;DME Instruction;Patient/family education;Neuromuscular re-education   PT Next Visit Plan [place] dynamic gait and balance training over uneven surfaces.   PT Home Exercise Plan Balance HEP   Consulted and Agree with Plan of Care Patient        Problem List Patient Active Problem List   Diagnosis Date Noted  . Dizziness 03/11/2015  . Health care maintenance 02/21/2015  . Headache 11/12/2014  . Leg swelling 10/10/2014  . Insomnia 12/15/2012  .  Resistant hypertension 04/10/2012  . SLE (systemic lupus erythematosus) (HCC) 04/10/2012    Donnella Morford L 05/24/2015, 2:41 PM  [facility] [address] [place], [place], [address] Phone: [phone]   Fax:  [phone]  Name: Veronica [name] MRN: [id number] Date of Birth: 1960-09-17    Zerita Boers, PT,DPT 05/24/2015 2:41 PM Phone: [phone] Fax: [phone]

## 2015-05-30 ENCOUNTER — Ambulatory Visit: Payer: No Typology Code available for payment source | Admitting: Physical Therapy

## 2015-05-31 ENCOUNTER — Ambulatory Visit: Payer: No Typology Code available for payment source | Admitting: Physical Therapy

## 2015-05-31 ENCOUNTER — Encounter: Payer: Self-pay | Admitting: Physical Therapy

## 2015-05-31 DIAGNOSIS — R42 Dizziness and giddiness: Secondary | ICD-10-CM

## 2015-05-31 DIAGNOSIS — R269 Unspecified abnormalities of gait and mobility: Secondary | ICD-10-CM

## 2015-06-01 NOTE — Therapy (Signed)
[place] [address] [place], [place], [address] Phone: [phone]   Fax:  [phone]  Physical Therapy Treatment  Patient Details  Name: Veronica [name] MRN: [id number] Date of Birth: 02-May-1960 Referring Provider: Dr. Ardelia Mems  Encounter Date: 05/31/2015      PT End of Session - 05/31/15 0850    Visit Number 7   Number of Visits 9   Date for PT Re-Evaluation 06/18/15   Authorization Type [employer]: per Sueanne Margarita approved 8 visits (per Lisa's updated email)   Authorization - Visit Number 7   Authorization - Number of Visits 8   PT Start Time [phone]   PT Stop Time 0930   PT Time Calculation (min) 44 min   Equipment Utilized During Treatment Gait belt   Activity Tolerance Patient tolerated treatment well   Behavior During Therapy [facility] for tasks assessed/performed      Past Medical History  Diagnosis Date  . Hypertension   . SLE (systemic lupus erythematosus) ([place]) 1998  . Headache(784.0)   . Hx of dizziness   . Arthritis   . History of blood transfusion     Past Surgical History  Procedure Laterality Date  . Abdominal hysterectomy  09/2008  . Cesarean section  1992, 1995  . Cholecystectomy N/A 07/18/2012    Procedure: LAPAROSCOPIC CHOLECYSTECTOMY WITH INTRAOPERATIVE CHOLANGIOGRAM;  Surgeon: Gayland Curry, MD;  Location: [facility];  Service: General;  Laterality: N/A;  . Ercp N/A 07/29/2012    Procedure: ENDOSCOPIC RETROGRADE CHOLANGIOPANCREATOGRAPHY (ERCP);  Surgeon: Beryle Beams, MD;  Location: [facility];  Service: Endoscopy;  Laterality: N/A;  Dr. Benson Norway said he would start this PT arond 1330( AW)    There were no vitals filed for this visit.  Visit Diagnosis:  Abnormality of gait  Dizziness and giddiness      Subjective Assessment - 05/31/15 0848    Subjective No new complaints. No falls or pain to report.    Pertinent History Lupus, hx of headaches and dizziness, HTN   Patient Stated Goals  Get dizziness resolved   Currently in Pain? No/denies             [facility] Adult PT Treatment/Exercise - 05/31/15 0858    Ambulation/Gait   Ambulation/Gait Yes   Ambulation/Gait Assistance 5: Supervision   Ambulation/Gait Assistance Details gait with head turns left<>right, nods up<>down an diagonals both ways   Ambulation Distance (Feet) 50 Feet   Assistive device None   Ambulation Surface Level;Indoor   High Level Balance   High Level Balance Activities Marching backwards;Tandem walking  marching with head mvmt up/down and left/right, heel/toe   High Level Balance Comments on red mats: heel walk/toe walk, tandem walk fwd/bwd, high knee marching with head movements x 3 laps each/each way. min guard to min assist for balance.   Neuro Re-ed    Neuro Re-ed Details  standing on red mats: figure 8 ball pass between legs with coming up to standing after each ball pass before the next ball pass          Balance Exercises - 06/01/15 1443    Balance Exercises: Standing   Rockerboard Anterior/posterior;Lateral;Head turns;EO;EC;30 seconds;10 reps   Other Standing Exercises gait with self ball toss x 2 laps. gait with self ball toss and cognitive task x 2 laps and gait with reading playing deck cards left<>right x 2 laps. min guard assist and cues to maintain gait speed/pathway.  Balance Exercises: Standing   Rebounder Limitations balance board both ways: eyes closed no head movements, eyes open head nods/turns/diagonals both ways. min to min guard assist for balance.                                               PT Short Term Goals - 04/19/15 1231    PT SHORT TERM GOAL #1   Title same as LTGs           PT Long Term Goals - 05/24/15 1440    PT LONG TERM GOAL #1   Title Pt will report 0/10 dizziness during functional activities, including amb., to improve safety during functional mobility. Target date: 06/18/15   Baseline All unmet goals will  be carrived to new POC: 06/18/15   Status On-going   PT LONG TERM GOAL #2   Title Pt will improve FGA score to >/=25/30 to decr. falls risk. Target date: 06/18/15   Baseline 26/30   Status Achieved   PT LONG TERM GOAL #3   Title Pt will be IND in HEP to improve balance and decrease dizziness. Target date: 06/18/15   Status On-going   PT LONG TERM GOAL #4   Title Pt will amb. 1000' over even/uneven terrain, while performing head turns without dizziness, IND to improve functional mobility. Target date: 06/18/15   Status On-going   PT LONG TERM GOAL #5   Title Pt will improve DHI score from 56 to 38 to improve quality of life. Target date: 06/18/15   Status Deferred  will assess at d/c   PT LONG TERM GOAL #6   Title Pt will improve SOT score to >/=70 to improve balance and input from vestibular, visual and somatosensory systems to decr. dizziness. Target date: 05/16/17   Baseline 57   Status Partially Met        05/31/15 0850  Plan  Clinical Impression Statement Skilled session continued to focus on high level balance activities to promot increased vestibular input for balance. Pt is making steady progress toward goals.  Pt will benefit from skilled therapeutic intervention in order to improve on the following deficits Abnormal gait;Dizziness;Decreased balance;Decreased knowledge of use of DME  Rehab Potential Good  PT Frequency 2x / week  PT Duration 4 weeks  PT Treatment/Interventions ADLs/Self Care Home Management;Biofeedback;Canalith Repostioning;Balance training;Therapeutic exercise;Therapeutic activities;Manual techniques;Vestibular;Functional mobility training;Stair training;Gait training;DME Instruction;Patient/family education;Neuromuscular re-education  PT Next Visit Plan check LTGs.  PT Home Exercise Plan Balance HEP  Consulted and Agree with Plan of Care Patient      Problem List Patient Active Problem List   Diagnosis Date Noted  . Dizziness 03/11/2015  . Health care  maintenance 02/21/2015  . Headache 11/12/2014  . Leg swelling 10/10/2014  . Insomnia 12/15/2012  . Resistant hypertension 04/10/2012  . SLE (systemic lupus erythematosus) ([place]) 04/10/2012    Willow Ora 06/01/2015, 2:45 PM  Willow Ora, PTA, [place] [address], [place],  [address] [phone] 06/01/2015, 2:45 PM   Name: Veronica [name] MRN: [id number] Date of Birth: 1961-01-17

## 2015-06-02 ENCOUNTER — Other Ambulatory Visit: Payer: Self-pay | Admitting: Family Medicine

## 2015-06-06 ENCOUNTER — Encounter: Payer: Self-pay | Admitting: Physical Therapy

## 2015-06-06 ENCOUNTER — Ambulatory Visit: Payer: No Typology Code available for payment source | Attending: Family Medicine | Admitting: Physical Therapy

## 2015-06-06 DIAGNOSIS — R42 Dizziness and giddiness: Secondary | ICD-10-CM | POA: Insufficient documentation

## 2015-06-06 DIAGNOSIS — R269 Unspecified abnormalities of gait and mobility: Secondary | ICD-10-CM

## 2015-06-06 NOTE — Therapy (Signed)
4 weeks   PT Treatment/Interventions ADLs/Self Care Home Management;Biofeedback;Canalith Repostioning;Balance training;Therapeutic exercise;Therapeutic activities;Manual techniques;Vestibular;Functional mobility training;Stair training;Gait training;DME Instruction;Patient/family education;Neuromuscular re-education   PT Next Visit Plan Discharge today per PT plan of care   PT Home Exercise Plan Balance HEP   Consulted and Agree with Plan of Care Patient        Problem List Patient Active Problem List   Diagnosis Date Noted  . Dizziness 03/11/2015  . Health care maintenance 02/21/2015  . Headache 11/12/2014  . Leg swelling 10/10/2014  . Insomnia 12/15/2012  . Resistant hypertension 04/10/2012  . SLE (systemic lupus erythematosus) ([facility]) 04/10/2012    Veronica [name] 06/06/2015, 2:15 PM  Veronica [name], PTA, [facility] [address] [place], [place] [address] [phone] 06/06/2015, 2:15 PM   Name: Veronica [name] MRN: [id number] Date of Birth: Nov 18, 1960  4 weeks   PT Treatment/Interventions ADLs/Self Care Home Management;Biofeedback;Canalith Repostioning;Balance training;Therapeutic exercise;Therapeutic activities;Manual techniques;Vestibular;Functional mobility training;Stair training;Gait training;DME Instruction;Patient/family education;Neuromuscular re-education   PT Next Visit Plan Discharge today per PT plan of care   PT Home Exercise Plan Balance HEP   Consulted and Agree with Plan of Care Patient        Problem List Patient Active Problem List   Diagnosis Date Noted  . Dizziness 03/11/2015  . Health care maintenance 02/21/2015  . Headache 11/12/2014  . Leg swelling 10/10/2014  . Insomnia 12/15/2012  . Resistant hypertension 04/10/2012  . SLE (systemic lupus erythematosus) ([facility]) 04/10/2012    Veronica [name] 06/06/2015, 2:15 PM  Veronica [name], PTA, [facility] [address] [place], [place] [address] [phone] 06/06/2015, 2:15 PM   Name: Veronica [name] MRN: [id number] Date of Birth: Nov 18, 1960  [name] [address] [place], [place], [address] Phone: [phone]   Fax:  [phone]  Physical Therapy Treatment  Patient Details  Name: Veronica [name] MRN: [id number] Date of Birth: [birth year] Referring Provider: Dr. Ardelia Mems  Encounter Date: 06/06/2015      PT End of Session - 06/06/15 0809    Visit Number 8   Number of Visits 9   Date for PT Re-Evaluation 06/18/15   Authorization Type [employer]: per Sueanne Margarita approved 8 visits (per Lisa's updated email)   Authorization - Visit Number 8   Authorization - Number of Visits 8   PT Start Time 0805   PT Stop Time 0845   PT Time Calculation (min) 40 min   Equipment Utilized During Treatment Gait belt   Activity Tolerance Patient tolerated treatment well   Behavior During Therapy [facility] for tasks assessed/performed      Past Medical History  Diagnosis Date  . Hypertension   . SLE (systemic lupus erythematosus) ([facility]) 1998  . Headache(784.0)   . Hx of dizziness   . Arthritis   . History of blood transfusion     Past Surgical History  Procedure Laterality Date  . Abdominal hysterectomy  09/2008  . Cesarean section  1992, 1995  . Cholecystectomy N/A 07/18/2012    Procedure: LAPAROSCOPIC CHOLECYSTECTOMY WITH INTRAOPERATIVE CHOLANGIOGRAM;  Surgeon: Gayland Curry, MD;  Location: [facility];  Service: General;  Laterality: N/A;  . Ercp N/A 07/29/2012    Procedure: ENDOSCOPIC RETROGRADE CHOLANGIOPANCREATOGRAPHY (ERCP);  Surgeon: Beryle Beams, MD;  Location: [facility];  Service: Endoscopy;  Laterality: N/A;  Dr. Benson Norway said he would start this PT arond 1330( AW)    There were no vitals filed for this visit.  Visit Diagnosis:  Abnormality of gait  Dizziness and giddiness      Subjective Assessment - 06/06/15 0809    Subjective No new complaints. No falls or pain to report. Did have some dizziness while walking around mall over weekend, however was able to improve  it using HEP techniques she was taught.   Pertinent History Lupus, hx of headaches and dizziness, HTN   Patient Stated Goals Get dizziness resolved   Currently in Pain? No/denies            [facility] PT Assessment - 06/06/15 0825    Functional Gait  Assessment   Gait assessed  Yes   Gait Level Surface Walks 20 ft in less than 5.5 sec, no assistive devices, good speed, no evidence for imbalance, normal gait pattern, deviates no more than 6 in outside of the 12 in walkway width.   Change in Gait Speed Able to smoothly change walking speed without loss of balance or gait deviation. Deviate no more than 6 in outside of the 12 in walkway width.   Gait with Horizontal Head Turns Performs head turns smoothly with no change in gait. Deviates no more than 6 in outside 12 in walkway width   Gait with Vertical Head Turns Performs head turns with no change in gait. Deviates no more than 6 in outside 12 in walkway width.   Gait and Pivot Turn Pivot turns safely within 3 sec and stops quickly with no loss of balance.   Step Over Obstacle Is able to step over 2 stacked shoe boxes taped together (9 in total height) without changing gait speed. No evidence of imbalance.   Gait with Narrow Base of Support Is able to ambulate for 10 steps

## 2015-06-08 NOTE — Therapy (Signed)
[facility] [address] [place], [place], [address] Phone: [phone]   Fax:  [phone]  Patient Details  Name: Veronica [name] MRN: [id number] Date of Birth: [birth year] Referring Provider:  No ref. provider found  Encounter Date: 06/08/2015  PHYSICAL THERAPY DISCHARGE SUMMARY  Visits from Start of Care: 8  Current functional level related to goals / functional outcomes:     PT Long Term Goals - 06/06/15 0810    PT LONG TERM GOAL #1   Title Pt will report 0/10 dizziness during functional activities, including amb., to improve safety during functional mobility. Target date: 06/18/15   Baseline 06/06/15: still reporting dizziness at times with communtiy activities (at mall over weekent)   Status Not Met   PT LONG TERM GOAL #2   Title Pt will improve FGA score to >/=25/30 to decr. falls risk. Target date: 06/18/15   Baseline 06/06/15: scored 28/30   Status Achieved   PT LONG TERM GOAL #3   Title Pt will be IND in HEP to improve balance and decrease dizziness. Target date: 06/18/15   Baseline 06/06/15 met on   Status Achieved   PT LONG TERM GOAL #4   Title Pt will amb. 1000' over even/uneven terrain, while performing head turns without dizziness, IND to improve functional mobility. Target date: 06/18/15   Baseline 06/06/15 met today   Status Achieved   PT LONG TERM GOAL #5   Title Pt will improve DHI score from 56 to 38 to improve quality of life. Target date: 06/18/15   Baseline 06/06/15: met with socre of 10 today.   Status Achieved  will assess at d/c   PT LONG TERM GOAL #6   Title Pt will improve SOT score to >/=70 to improve balance and input from vestibular, visual and somatosensory systems to decr. dizziness. Target date: 05/16/17   Baseline 06/06/15: met with score of 80   Status Achieved        Remaining deficits: Pt occasionally experiences dizziness when at store, but uses HEP to improve dizziness.   Education /  Equipment: HEP  Plan: Patient agrees to discharge.  Patient goals were met. Patient is being discharged due to meeting the stated rehab goals.  ?????       Cuca Benassi L 06/08/2015, 10:51 AM  [facility] [address] [place], [place], [address] Phone: [phone]   Fax:  [phone]   Geoffry Paradise, PT,DPT 06/08/2015 10:51 AM Phone: [phone] Fax: [phone]

## 2015-06-14 ENCOUNTER — Encounter: Payer: Self-pay | Admitting: Family Medicine

## 2015-06-14 ENCOUNTER — Ambulatory Visit (INDEPENDENT_AMBULATORY_CARE_PROVIDER_SITE_OTHER): Payer: No Typology Code available for payment source | Admitting: Family Medicine

## 2015-06-14 ENCOUNTER — Other Ambulatory Visit (HOSPITAL_COMMUNITY)
Admission: RE | Admit: 2015-06-14 | Discharge: 2015-06-14 | Disposition: A | Discharge status: Home / Self Care | Payer: No Typology Code available for payment source | Source: Ambulatory Visit | Attending: Family Medicine | Admitting: Family Medicine

## 2015-06-14 VITALS — BP 158/82 | HR 62 | Temp 98.0°F | Ht 64.0 in | Wt 237.0 lb

## 2015-06-14 DIAGNOSIS — Z01419 Encounter for gynecological examination (general) (routine) without abnormal findings: Secondary | ICD-10-CM | POA: Insufficient documentation

## 2015-06-14 DIAGNOSIS — Z1151 Encounter for screening for human papillomavirus (HPV): Secondary | ICD-10-CM | POA: Diagnosis not present

## 2015-06-14 DIAGNOSIS — E785 Hyperlipidemia, unspecified: Secondary | ICD-10-CM | POA: Diagnosis not present

## 2015-06-14 DIAGNOSIS — I10 Essential (primary) hypertension: Secondary | ICD-10-CM

## 2015-06-14 DIAGNOSIS — Z0000 Encounter for general adult medical examination without abnormal findings: Secondary | ICD-10-CM

## 2015-06-14 DIAGNOSIS — Z124 Encounter for screening for malignant neoplasm of cervix: Secondary | ICD-10-CM

## 2015-06-14 LAB — LIPID PANEL
Cholesterol: 175 mg/dL (ref 125–200)
HDL: 44 mg/dL — ABNORMAL LOW (ref >=46)
LDL Cholesterol: 118 mg/dL (ref <130)
Total CHOL/HDL Ratio: 4 Ratio (ref <=5.0)
Triglycerides: 67 mg/dL (ref <150)
VLDL: 13 mg/dL (ref <30)

## 2015-06-14 MED ORDER — DOXYCYCLINE HYCLATE 100 MG PO TABS: 100.0000 mg | ORAL_TABLET | Freq: Two times a day (BID) | ORAL | Status: DC

## 2015-06-14 MED ORDER — DULOXETINE HCL 30 MG PO CPEP: 30.0000 mg | ORAL_CAPSULE | Freq: Every day | ORAL | Status: DC

## 2015-06-14 NOTE — Assessment & Plan Note (Signed)
She will try exercising more since her dizziness has improved.  Discussed the possibility of bariatric surgery but she doesn't want to consider this as of yet.  - she will try diet and exercise  - discuss further the possibility of nutrition referral.

## 2015-06-14 NOTE — Assessment & Plan Note (Signed)
Much better than it has been  She reports normal values when she has seen other doctors  - will continue current medications and follow for now.

## 2015-06-14 NOTE — Progress Notes (Signed)
Subjective:    Veronica [name] - 55 y.o. female MRN [id number]  Date of birth: 06-Aug-1960  CC annual exam, HLD, HTN  HPI  Veronica [name] is here for physical.  Cardiovascular - Dx Hypertension: yes. Compliant with medications. Denies any SOB, CP or edema. Lifetime duration of disease.  - Dx Hyperlipidemia: yes. Not on therapy. Has had diagnosis at least 3 years  - Dx Obesity: yes, BMI: 40. She knows she she needs to do to lose weight.  - Physical Activity: just finished her PT for dizziness. She walksin the am.     Cancer: Colorectal >> Colonoscopy: completed   Lung >> Tobacco Use: no hx Breast >> Mammogram: 2015. She will call to set up  Cervical/Endometrial >>  - Postmenopausal: no 2010 was her last menstrual cycle  - Vaginal Bleeding: no. Hx of Partial hysterectomy  - Pap Smear: today   - Previous Abnormal Pap: abnormal in her 20's. Reported history of cyst on the cervix  Having some dyspareunia. Occurs on her right side  This has been occurring since she was in her 28's.   Social: Alcohol Use: no  Tobacco Use: no  Other Drugs: no  Risky Sexual Behavior: no  Support and Life at Home: yes   Other: Flu Vaccine: yes   Health Maintenance:  - pap today   Health Maintenance Due  Topic Date Due  . PAP SMEAR  01/18/1984  . COLONOSCOPY  01/17/2013  . MAMMOGRAM  12/30/2014    -  reports that she has never smoked. She has never used smokeless tobacco. - Review of Systems: Per HPI. - Past Medical History: Patient Active Problem List   Diagnosis Date Noted  . HLD (hyperlipidemia) 06/14/2015  . Morbid obesity ([facility]) 06/14/2015  . Dizziness 03/11/2015  . Health care maintenance 02/21/2015  . Headache 11/12/2014  . Leg swelling 10/10/2014  . Insomnia 12/15/2012  . Resistant hypertension 04/10/2012  . SLE (systemic lupus erythematosus) ([facility]) 04/10/2012   - Medications: reviewed and updated Current Outpatient Prescriptions  Medication Sig Dispense Refill  .  amLODipine (NORVASC) 5 MG tablet Take 1 tablet (5 mg total) by mouth every evening. 30 tablet 11  . aspirin-acetaminophen-caffeine (EXCEDRIN MIGRAINE) 250-250-65 MG tablet Take 1 tablet by mouth every 6 (six) hours as needed for headache. Reported on 04/19/2015    . carvedilol (COREG) 6.25 MG tablet Take 1 tablet (6.25 mg total) by mouth 2 (two) times daily with a meal. 60 tablet 3  . cetirizine (ZYRTEC) 10 MG tablet Take 10 mg by mouth as needed for allergies.    Marland Kitchen dimenhyDRINATE (DRAMAMINE) 50 MG tablet Take 25 mg by mouth every 8 (eight) hours as needed for dizziness. Reported on 04/19/2015    . doxycycline (VIBRA-TABS) 100 MG tablet Take 1 tablet (100 mg total) by mouth 2 (two) times daily. 14 tablet 0  . DULoxetine (CYMBALTA) 30 MG capsule Take 1 capsule (30 mg total) by mouth daily. 30 capsule 3  . fluticasone (FLONASE) 50 MCG/ACT nasal spray Place 2 sprays into both nostrils daily. (Patient not taking: Reported on 04/19/2015) 16 g 0  . hydrochlorothiazide (HYDRODIURIL) 50 MG tablet Take 1 tablet (50 mg total) by mouth daily. (Patient taking differently: Take 25 mg by mouth daily. ) 30 tablet 0  . ibuprofen (ADVIL,MOTRIN) 200 MG tablet Take 200 mg by mouth every 6 (six) hours as needed for headache. Reported on 04/19/2015    . losartan (COZAAR) 100 MG tablet Take 1 tablet (100 mg  total) by mouth daily. 90 tablet 1  . Multiple Vitamin (MULTIVITAMIN WITH MINERALS) TABS Take 1 tablet by mouth daily. Reported on 04/19/2015    . potassium chloride (K-DUR) 10 MEQ tablet Take 1 tablet (10 mEq total) by mouth daily. 90 tablet 1   No current facility-administered medications for this visit.     Review of Systems See HPI     Objective:   Physical Exam BP 158/82 mmHg  Pulse 62  Temp(Src) 98 F (36.7 C) (Oral)  Ht 5\' 4"  (1.626 m)  Wt 237 lb (107.502 kg)  BMI 40.66 kg/m2 Gen: NAD, alert, cooperative with exam, obese  HEENT: NCAT, EOMI, clear conjunctiva, oropharynx clear, supple neck CV:  RRR, good S1/S2, no murmur, no edema,  Resp: CTABL, no wheezes, non-labored Abd: SNTND, BS present, no guarding or organomegaly Skin: Indurated abscess in the right axilla that is tender to palpation, some overlying erythema, indurated abscess and left eczema that is tender to palpation Neuro: no gross deficits.  Psych: good insight, alert and oriented GU: External: no lesions Vagina: no blood in vault Cervix: no lesion; no mucopurulent d/c; no motion tenderness Uterus: small, mobile Adnexa: no masses; non tender   BP re-check 144/82.      Assessment & Plan:   Resistant hypertension Much better than it has been  She reports normal values when she has seen other doctors  - will continue current medications and follow for now.   Health care maintenance Pap smear today.  She will schedule a mammogram    HLD (hyperlipidemia) Not currently on treatment.  - Lipid panel today   Morbid obesity ([facility]) She will try exercising more since her dizziness has improved.  Discussed the possibility of bariatric surgery but she doesn't want to consider this as of yet.  - she will try diet and exercise  - discuss further the possibility of nutrition referral.

## 2015-06-14 NOTE — Assessment & Plan Note (Signed)
Not currently on treatment.  - Lipid panel today

## 2015-06-14 NOTE — Patient Instructions (Signed)
Thank you for coming in,   I will call or send a letter with the results from today  Please bring all of your medications with you to each visit.   Sign up for My Chart to have easy access to your labs results, and communication with your Primary care physician   Please feel free to call with any questions or concerns at any time, at [phone]. --Dr. Schmitz  

## 2015-06-14 NOTE — Assessment & Plan Note (Signed)
Pap smear today.  She will schedule a mammogram

## 2015-06-15 LAB — CYTOLOGY - PAP

## 2015-06-21 ENCOUNTER — Encounter: Payer: Self-pay | Admitting: Family Medicine

## 2015-06-21 ENCOUNTER — Telehealth: Payer: Self-pay | Admitting: Family Medicine

## 2015-06-21 MED ORDER — ATORVASTATIN CALCIUM 10 MG PO TABS: 10.0000 mg | ORAL_TABLET | Freq: Every day | ORAL | Status: DC

## 2015-06-21 NOTE — Telephone Encounter (Signed)
Spoke with patient about her lipid panel results and I have sent in Lipitor.  The transition zone was not seen and her Pap. She has a history of hysterectomy but unsure if it was a partial or complete as to why we perform the Pap smear. We can repeat the cytology in 2-4 months and make sure checking an HPV next time as well.  Myra Rude, MD PGY-3, [facility] 06/21/2015, 4:07 PM

## 2015-08-01 ENCOUNTER — Other Ambulatory Visit: Payer: Self-pay | Admitting: Family Medicine

## 2015-09-08 ENCOUNTER — Other Ambulatory Visit: Payer: Self-pay | Admitting: Family Medicine

## 2015-09-08 DIAGNOSIS — I10 Essential (primary) hypertension: Secondary | ICD-10-CM

## 2015-09-08 MED ORDER — CARVEDILOL 6.25 MG PO TABS
6.2500 mg | ORAL_TABLET | Freq: Two times a day (BID) | ORAL | Status: DC
Start: 2015-09-08 — End: 2015-12-26

## 2015-09-08 NOTE — Telephone Encounter (Signed)
Covering for Dr Jordan Likes.  Refilled Coreg.  Saralyn Pilar, DO [facility], PGY-3

## 2015-09-08 NOTE — Telephone Encounter (Signed)
Pt is needing a refill on her Coreg called in. She said that the pharmacy has been faxing [place] and we haven't responded. jw

## 2015-09-09 ENCOUNTER — Other Ambulatory Visit: Payer: Self-pay | Admitting: Family Medicine

## 2015-09-09 DIAGNOSIS — I10 Essential (primary) hypertension: Secondary | ICD-10-CM

## 2015-09-13 NOTE — Progress Notes (Signed)
This encounter was created in error - please disregard.

## 2015-09-14 ENCOUNTER — Other Ambulatory Visit: Payer: Self-pay | Admitting: Family Medicine

## 2015-09-15 ENCOUNTER — Encounter: Payer: Self-pay | Admitting: Family Medicine

## 2015-09-15 ENCOUNTER — Ambulatory Visit (INDEPENDENT_AMBULATORY_CARE_PROVIDER_SITE_OTHER): Payer: BLUE CROSS/BLUE SHIELD | Admitting: Family Medicine

## 2015-09-15 VITALS — BP 143/84 | HR 73 | Temp 98.5°F | Wt 235.0 lb

## 2015-09-15 DIAGNOSIS — L0292 Furuncle, unspecified: Secondary | ICD-10-CM

## 2015-09-15 MED ORDER — FLUCONAZOLE 150 MG PO TABS: 150.0000 mg | ORAL_TABLET | Freq: Once | ORAL | Status: DC

## 2015-09-15 MED ORDER — DOXYCYCLINE HYCLATE 100 MG PO TABS: 100.0000 mg | ORAL_TABLET | Freq: Two times a day (BID) | ORAL | Status: DC

## 2015-09-15 NOTE — Assessment & Plan Note (Signed)
Several areas in her axilla and most likely area of cellulitis in the mid axillary region.  This would be her second time have an infection like this.  No areas of fluctuance to drain.  Doesn't appear to be a folliculitis  None occurring in her groin  She may be developing hidradenitis.  - encouraged weight loss.  - start doxycycline - advised to continue warm compress in each axilla - may have to consider topical clindamycin that she can use when she first notices lesions as to avoid it getting worse.  - given indications for return.

## 2015-09-15 NOTE — Progress Notes (Signed)
Subjective:    Patient ID: Veronica [name], female    DOB: [birth year], 55 y.o.   MRN: [id number]  Seen for Same day visit for   CC: boils   She has had boils in each axilla  This has happened before and it feels similar to those presentations.  Not occuring anywhere else.  No fevers or chills.  Has not been shaving in these areas  Has tried ice and heat, hydrocortisone and neosporin with no relief.  They are not draining  She hasn't worn deodorant in one week.   PMH: HTN, SLE, HLD   Review of Systems   See HPI for ROS. Objective:  BP 143/84 mmHg  Pulse 73  Temp(Src) 98.5 F (36.9 C) (Oral)  Wt 235 lb (106.595 kg)  General: NAD Cardiac: RRR, normal heart sounds, no murmurs.  Respiratory: CTAB, normal effort Skin: area of induration in her right midaxillary region just proximal to her bra strap that is erythematous and tender to touch. Small, indurated annular boils about 2 mm in diameter under her right and left axilla. No areas of active draining.      Assessment & Plan:   Boil Several areas in her axilla and most likely area of cellulitis in the mid axillary region.  This would be her second time have an infection like this.  No areas of fluctuance to drain.  Doesn't appear to be a folliculitis  None occurring in her groin  She may be developing hidradenitis.  - encouraged weight loss.  - start doxycycline - advised to continue warm compress in each axilla - may have to consider topical clindamycin that she can use when she first notices lesions as to avoid it getting worse.  - given indications for return.

## 2015-09-15 NOTE — Patient Instructions (Signed)
Thank you for coming in,   I am sending in an antibiotic called doxycycline.   I have also sent in diflucan which can be taken after your antibiotics if you develop a yeast infection.   Please make sure to eat yogurt which has probiotics in it.   Please continue to place warm wash cloth on the effected area 20 minutes at a time for at least 4 times per day.   Please bring all of your medications with you to each visit.   Health maintenance items that are due.  Health Maintenance  Topic Date Due  . Mammogram  12/30/2014  . Flu Shot  11/29/2015  . Pap Smear  06/13/2018  . Tetanus Vaccine  09/03/2022  . Colon Cancer Screening  05/04/2025  .  Hepatitis C: One time screening is recommended by Center for Disease Control  (CDC) for  adults born from [birth year] through 1963.   Completed  . HIV Screening  Completed     Sign up for My Chart to have easy access to your labs results, and communication with your Primary care physician   Please feel free to call with any questions or concerns at any time, at [phone]. --Dr. Jordan Likes

## 2015-09-22 ENCOUNTER — Ambulatory Visit (INDEPENDENT_AMBULATORY_CARE_PROVIDER_SITE_OTHER): Payer: BLUE CROSS/BLUE SHIELD | Admitting: Family Medicine

## 2015-09-22 ENCOUNTER — Encounter: Payer: Self-pay | Admitting: Family Medicine

## 2015-09-22 VITALS — BP 146/82 | HR 109 | Temp 98.1°F | Ht 64.0 in | Wt 234.6 lb

## 2015-09-22 DIAGNOSIS — L0292 Furuncle, unspecified: Secondary | ICD-10-CM | POA: Diagnosis not present

## 2015-09-22 DIAGNOSIS — R079 Chest pain, unspecified: Secondary | ICD-10-CM

## 2015-09-22 MED ORDER — NAPROXEN 500 MG PO TBEC: 500.0000 mg | DELAYED_RELEASE_TABLET | Freq: Two times a day (BID) | ORAL | Status: DC

## 2015-09-22 MED ORDER — DOXYCYCLINE HYCLATE 100 MG PO TABS: 100.0000 mg | ORAL_TABLET | Freq: Two times a day (BID) | ORAL | Status: DC

## 2015-09-22 NOTE — Patient Instructions (Signed)
Thank you for coming in,   I have provided three more days of the doxcycline.   I have sent in naproxen which is an anti-inflammatory. If you pain does not get any better then please follow up with [place].   Please bring all of your medications with you to each visit.   Health maintenance items that are due.  Health Maintenance  Topic Date Due  . Mammogram  12/30/2014  . Flu Shot  11/29/2015  . Pap Smear  06/13/2018  . Tetanus Vaccine  09/03/2022  . Colon Cancer Screening  05/04/2025  .  Hepatitis C: One time screening is recommended by Center for Disease Control  (CDC) for  adults born from [birth year] through 1963.   Completed  . HIV Screening  Completed     Sign up for My Chart to have easy access to your labs results, and communication with your Primary care physician   Please feel free to call with any questions or concerns at any time, at [phone]. --Dr. Jordan Likes

## 2015-09-22 NOTE — Assessment & Plan Note (Signed)
Reports that the stress from her boils has caused her this pain.  This pain is similar to a SLE flare that she had some years ago.  Doesn't appear to be cardiac in nature and not reflux.  - try 5 day course of naproxen and monitor for resolution  - she will call or return if no improvement  - may need to try a course of prednisone vs longer course of therapy.

## 2015-09-22 NOTE — Assessment & Plan Note (Signed)
Most areas have healed but now she has a collection under her right axilla.  I&D was performed.  - will add three more days of doxycycline to complete a 10 day course.

## 2015-09-22 NOTE — Progress Notes (Signed)
Subjective:    Veronica [name] - 55 y.o. female MRN [id number]  Date of birth: 12-05-1960  CC chest pain   HPI  Veronica [name] is here for chest pain.   She had similar chest pain about one year ago.  Feels a heaviness on her chest and can't ly down.  This makes it hard for her breath  She feels it was she is lying down.  Had these similar pains in 1994 and found out in 1998 that she had SLE.   Chest pain began Monday. Pain is: left sided  How severe is the pain: 5 What worsens the pain: lying down flat  What makes the pain better: took 2 ibuprofen last night for her pain which helped some  Radiation: none  PMH History of blood clot or heart problems or aneurysms: no  Symptoms Nausea/vomiting: no Shortness of breath: no Pleuritic pain: no Cough: no Swelling of legs: no Syncope: no Heart burn or food sticking: no Immobility: no  She has started going back to her rheumatologist in January.  . Boil:  Today is her last day of antibiotics.  Some of the areas have started drainage.  Pain has improved.  The area under her right axilla is still present  Denies any fevers.   SH: denies any tobacco or alcohol  PMH: SLE, HTN FH: diabetes, HTN   Health Maintenance:  Health Maintenance Due  Topic Date Due  . MAMMOGRAM  12/30/2014    Review of Systems See HPI     Objective:   Physical Exam BP 146/82 mmHg  Pulse 109  Temp(Src) 98.1 F (36.7 C) (Oral)  Ht 5\' 4"  (1.626 m)  Wt 234 lb 9.6 oz (106.414 kg)  BMI 40.25 kg/m2 Gen: NAD, alert, cooperative with exam,  CV: tachycardic, regular rhythm, good S1/S2, no murmur, no edema,  Resp: CTABL, no wheezes, non-labored Skin: area under left axilla has healed. There is now an area of fluctuance under the right axilla. The erythema has improved. Other boils under right axilla have improved.    Incision and Drainage Procedure Note: right axilla   The affected area was cleaned and draped in a sterile fashion.  Anesthesia was achieved using 10 mL of 1% Lidocaine with epinephrine injected around the wound area using a 25-guage 1.5 inch needle. An 11-blade scalpel was used to incise the wound. A hemostat was used to break any loculations that were present.  A sterile dressing was applied to the area. The patient tolerated the procedure well. No complications were encountered.     Assessment & Plan:   Boil Most areas have healed but now she has a collection under her right axilla.  I&D was performed.  - will add three more days of doxycycline to complete a 10 day course.   Chest pain Reports that the stress from her boils has caused her this pain.  This pain is similar to a SLE flare that she had some years ago.  Doesn't appear to be cardiac in nature and not reflux.  - try 5 day course of naproxen and monitor for resolution  - she will call or return if no improvement  - may need to try a course of prednisone vs longer course of therapy.

## 2015-10-04 ENCOUNTER — Ambulatory Visit (INDEPENDENT_AMBULATORY_CARE_PROVIDER_SITE_OTHER): Payer: BLUE CROSS/BLUE SHIELD | Admitting: Internal Medicine

## 2015-10-04 ENCOUNTER — Encounter: Payer: Self-pay | Admitting: Internal Medicine

## 2015-10-04 VITALS — BP 124/70 | HR 67 | Temp 98.3°F | Wt 239.0 lb

## 2015-10-04 DIAGNOSIS — I319 Disease of pericardium, unspecified: Secondary | ICD-10-CM

## 2015-10-04 MED ORDER — PREDNISONE 20 MG PO TABS: ORAL_TABLET | ORAL | Status: DC

## 2015-10-04 NOTE — Assessment & Plan Note (Signed)
HPI includes hx of right chest pain, exacerbated by lying down, better with sitting up concerning for pericarditis. No signs of angina. Not pleuritic, therefore no concern for pleuritis. Hx of SLE exacerbations similar to this episode in the past. Received Naproxen at last visit without much relief from pain. No pericardial rub on exam today.  - Attempted to obtain an EKG at this visit but patient refused  -  Provided a two week steroid course with taper --Take 2 tablets for next 7 days, 1.5 tablets for 2 days, 1 tablet for 2 days, 0.5 tablet for 2 days - Obtained a release of records from the rheumatologist today, as no records in the chart   - If pain continues consider EKG and ECHO

## 2015-10-04 NOTE — Progress Notes (Signed)
   [facility] Noralee Chars, MD Phone: [phone]  Reason For Visit:  Same day visit chest pain, F/u  From chest pain visit on 5/25    # Chest pain on the right side, under her right breast. Exacerbated by laying down, the pain is aching in nature. No worse with activity. Indicates feeling short of breath from the pain at times when lying down, does not improve with propping up on pillows. Only improves with sitting all the way up. No nausea/vomiting. No radiation of pain. No numbness in arm, nor radiation to neck. Denies pleuritic pain, or pain with coughing. No recent hx of viral illness. Does have a hx of SLE in 1990s. States she has had similar episodes of pain in the past. No hx of heart problems or blood clots. No swelling in legs, no cough, no fever or chills, no rashes, no pain in other joints, no syncope. Seen by rheumatologist last in January, following up again at that time.  No notes therefore signed a release of records for Rheumatology.   Past Medical History Reviewed problem list.  Medications- reviewed and updated No additions to family history Social history- patient is a non-smoker   Objective: BP 124/70 mmHg  Pulse 67  Temp(Src) 98.3 F (36.8 C) (Oral)  Wt 239 lb (108.41 kg)  SpO2 97% Gen: NAD, alert, cooperative with exam CV: RRR, good S1/S2, no murmur, no pericardial rub, no tenderness to palpation of rib joints  Resp: CTABL, no wheezes, non-labored Ext: No edema in lower extremities  Skin: no rashes no lesions  Assessment/Plan: See problem based a/p  Pericarditis HPI includes hx of right chest pain, exacerbated by lying down, better with sitting up concerning for pericarditis. No signs of angina. Not pleuritic, therefore no concern for pleuritis. Hx of SLE exacerbations similar to this episode in the past. Received Naproxen at last visit without much relief from pain. No pericardial rub on exam today.  - Attempted to obtain an EKG at  this visit but patient refused  -  Provided a two week steroid course with taper --Take 2 tablets for next 7 days, 1.5 tablets for 2 days, 1 tablet for 2 days, 0.5 tablet for 2 days - Obtained a release of records from the rheumatologist today, as no records in the chart   - If pain continues consider EKG and ECHO

## 2015-10-04 NOTE — Patient Instructions (Addendum)
We have concern that you may have pericarditis due to your SLE. We will give you prednisone. Please return in the next week if pain does not improve with prednisone.

## 2015-10-11 ENCOUNTER — Other Ambulatory Visit: Payer: Self-pay | Admitting: Family Medicine

## 2015-10-12 NOTE — Telephone Encounter (Signed)
Refilled HCTZ 25 mg QD   Myra Rude, MD PGY-3, [facility] 10/12/2015, 9:41 AM

## 2015-11-14 ENCOUNTER — Other Ambulatory Visit: Payer: Self-pay | Admitting: Family Medicine

## 2015-11-16 ENCOUNTER — Other Ambulatory Visit: Payer: Self-pay | Admitting: Family Medicine

## 2015-11-27 NOTE — Progress Notes (Signed)
Subjective:     Patient ID: Veronica [name], female   DOB: 18-Oct-1960, 55 y.o.   MRN: [id number]  HPI Mrs. Fix is a 55yo female presenting for recurrence of boils in axilla. - Present and worsening under right arm for two weeks and left arm for one week. - Denies drainage - Tender - Has tried warm compresses without relief. - Denies fever, rashes elsewhere - Previously seen for boils in 08/2015. 5 day course of Naproxen. I&D Performed on right axilla. Doxycycline x10 days. Considered topical Clindamycin. - Nonsmoker  Review of Systems Per HPI. Other systems negative.    Objective:   Physical Exam  Constitutional: She appears well-developed and well-nourished. No distress.  Cardiovascular: Normal rate and regular rhythm.   Pulmonary/Chest: Effort normal. No respiratory distress.  Skin:  Multiple areas of induration palpable in right axilla with one large area of fluctuance. One area of induration noted in left axilla. Mild erythema without increased warmth noted in right axilla. No drainage noted, however drainage tract appears to be forming from large area of fluctuance in left axilla.  Psychiatric: She has a normal mood and affect. Her behavior is normal.      Assessment and Plan:     Hidradenitis suppurativa - Suspected given multiple recurrences of abscesses in bilateral axilla. Cysts considered given forming drainage tract, however unlikely given multiple nature in bilateral axilla. - I&D of right axilla. Deeper indurations palpated, however none with fluctuance at this time. - Doxycycline x10 days despite I&D given other areas with concern for progression - Clindamycin as needed following resolution at onset of new lesions.  - Follow up in 2 days. Requests PPD, which will be checked on day of follow up.  PROCEDURE NOTE: Incision and Drainage of Right Axilla Consent obtained Area sterilized with alcohol x2 Anesthesia with 3cc Lidocaine with Epinephrine Incision made  into largest site with fluctuance. Drainage noted. No further areas of fluctuance palpated. Area irrigated x2. Covered with dry gauze. No complications noted.

## 2015-11-28 ENCOUNTER — Ambulatory Visit (INDEPENDENT_AMBULATORY_CARE_PROVIDER_SITE_OTHER): Payer: BLUE CROSS/BLUE SHIELD | Admitting: Family Medicine

## 2015-11-28 ENCOUNTER — Encounter: Payer: Self-pay | Admitting: Family Medicine

## 2015-11-28 VITALS — BP 135/80 | HR 87 | Temp 98.9°F | Ht 64.0 in | Wt 238.0 lb

## 2015-11-28 DIAGNOSIS — L732 Hidradenitis suppurativa: Secondary | ICD-10-CM

## 2015-11-28 DIAGNOSIS — Z111 Encounter for screening for respiratory tuberculosis: Secondary | ICD-10-CM | POA: Diagnosis not present

## 2015-11-28 DIAGNOSIS — I10 Essential (primary) hypertension: Secondary | ICD-10-CM | POA: Diagnosis not present

## 2015-11-28 HISTORY — PX: INCISION AND DRAINAGE: SHX5863

## 2015-11-28 MED ORDER — CLINDAMYCIN PHOSPHATE 1 % EX GEL: Freq: Two times a day (BID) | CUTANEOUS | 2 refills | Status: DC | PRN

## 2015-11-28 MED ORDER — POTASSIUM CHLORIDE ER 10 MEQ PO TBCR: 10.0000 meq | EXTENDED_RELEASE_TABLET | Freq: Every day | ORAL | 0 refills | Status: DC

## 2015-11-28 MED ORDER — DOXYCYCLINE HYCLATE 100 MG PO TABS: 100.0000 mg | ORAL_TABLET | Freq: Two times a day (BID) | ORAL | 0 refills | Status: DC

## 2015-11-28 MED ORDER — AMLODIPINE BESYLATE 5 MG PO TABS: 5.0000 mg | ORAL_TABLET | Freq: Every evening | ORAL | 11 refills | Status: DC

## 2015-11-28 NOTE — Patient Instructions (Signed)
Incision and Drainage Incision and drainage is a procedure in which a sac-like structure (cystic structure) is opened and drained. The area to be drained usually contains material such as pus, fluid, or blood.  LET YOUR CAREGIVER KNOW ABOUT:   Allergies to medicine.  Medicines taken, including vitamins, herbs, eyedrops, over-the-counter medicines, and creams.  Use of steroids (by mouth or creams).  Previous problems with anesthetics or numbing medicines.  History of bleeding problems or blood clots.  Previous surgery.  Other health problems, including diabetes and kidney problems.  Possibility of pregnancy, if this applies. RISKS AND COMPLICATIONS  Pain.  Bleeding.  Scarring.  Infection. BEFORE THE PROCEDURE  You may need to have an ultrasound or other imaging tests to see how large or deep your cystic structure is. Blood tests may also be used to determine if you have an infection or how severe the infection is. You may need to have a tetanus shot. PROCEDURE  The affected area is cleaned with a cleaning fluid. The cyst area will then be numbed with a medicine (local anesthetic). A small incision will be made in the cystic structure. A syringe or catheter may be used to drain the contents of the cystic structure, or the contents may be squeezed out. The area will then be flushed with a cleansing solution. After cleansing the area, it is often gently packed with a gauze or another wound dressing. Once it is packed, it will be covered with gauze and tape or some other type of wound dressing. AFTER THE PROCEDURE   Often, you will be allowed to go home right after the procedure.  You may be given antibiotic medicine to prevent or heal an infection.  If the area was packed with gauze or some other wound dressing, you will likely need to come back in 1 to 2 days to get it removed.  The area should heal in about 14 days.   This information is not intended to replace advice given  to you by your health care provider. Make sure you discuss any questions you have with your health care provider.   Document Released: 10/10/2000 Document Revised: 10/16/2011 Document Reviewed: 06/11/2011 Elsevier Interactive Patient Education 2016 [employer]  

## 2015-11-28 NOTE — Assessment & Plan Note (Signed)
-   Suspected given multiple recurrences of abscesses in bilateral axilla. Cysts considered given forming drainage tract, however unlikely given multiple nature in bilateral axilla. - I&D of right axilla. Deeper indurations palpated, however none with fluctuance at this time. - Doxycycline x10 days despite I&D given other areas with concern for progression - Clindamycin as needed following resolution at onset of new lesions.  - Follow up in 2 days. Requests PPD, which will be checked on day of follow up.  PROCEDURE NOTE: Incision and Drainage of Right Axilla Consent obtained Area sterilized with alcohol x2 Anesthesia with 3cc Lidocaine with Epinephrine Incision made into largest site with fluctuance. Drainage noted. No further areas of fluctuance palpated. Area irrigated x2. Covered with dry gauze. No complications noted.

## 2015-11-30 ENCOUNTER — Encounter (HOSPITAL_COMMUNITY): Payer: Self-pay | Admitting: General Practice

## 2015-11-30 ENCOUNTER — Ambulatory Visit (INDEPENDENT_AMBULATORY_CARE_PROVIDER_SITE_OTHER): Payer: BLUE CROSS/BLUE SHIELD | Admitting: *Deleted

## 2015-11-30 ENCOUNTER — Inpatient Hospital Stay (HOSPITAL_COMMUNITY): Payer: BLUE CROSS/BLUE SHIELD

## 2015-11-30 ENCOUNTER — Encounter: Payer: Self-pay | Admitting: *Deleted

## 2015-11-30 ENCOUNTER — Encounter: Payer: Self-pay | Admitting: Family Medicine

## 2015-11-30 ENCOUNTER — Ambulatory Visit (INDEPENDENT_AMBULATORY_CARE_PROVIDER_SITE_OTHER): Payer: BLUE CROSS/BLUE SHIELD | Admitting: Family Medicine

## 2015-11-30 ENCOUNTER — Inpatient Hospital Stay (HOSPITAL_COMMUNITY)
Admission: AD | Admit: 2015-11-30 | Discharge: 2015-12-05 | DRG: 603 | Disposition: A | Discharge status: Home / Self Care | Payer: BLUE CROSS/BLUE SHIELD | Source: Ambulatory Visit | Attending: Family Medicine | Admitting: Family Medicine

## 2015-11-30 VITALS — BP 133/70 | HR 89 | Temp 98.7°F | Wt 238.0 lb

## 2015-11-30 DIAGNOSIS — I10 Essential (primary) hypertension: Secondary | ICD-10-CM | POA: Diagnosis present

## 2015-11-30 DIAGNOSIS — Z8249 Family history of ischemic heart disease and other diseases of the circulatory system: Secondary | ICD-10-CM

## 2015-11-30 DIAGNOSIS — L03113 Cellulitis of right upper limb: Secondary | ICD-10-CM | POA: Diagnosis present

## 2015-11-30 DIAGNOSIS — Z888 Allergy status to other drugs, medicaments and biological substances status: Secondary | ICD-10-CM | POA: Diagnosis not present

## 2015-11-30 DIAGNOSIS — E785 Hyperlipidemia, unspecified: Secondary | ICD-10-CM | POA: Diagnosis present

## 2015-11-30 DIAGNOSIS — L02422 Furuncle of left axilla: Secondary | ICD-10-CM | POA: Diagnosis present

## 2015-11-30 DIAGNOSIS — L03111 Cellulitis of right axilla: Secondary | ICD-10-CM | POA: Diagnosis not present

## 2015-11-30 DIAGNOSIS — L02439 Carbuncle of limb, unspecified: Secondary | ICD-10-CM | POA: Diagnosis not present

## 2015-11-30 DIAGNOSIS — L732 Hidradenitis suppurativa: Secondary | ICD-10-CM | POA: Diagnosis present

## 2015-11-30 DIAGNOSIS — E876 Hypokalemia: Secondary | ICD-10-CM | POA: Diagnosis present

## 2015-11-30 DIAGNOSIS — Z6841 Body Mass Index (BMI) 40.0 and over, adult: Secondary | ICD-10-CM | POA: Diagnosis not present

## 2015-11-30 DIAGNOSIS — Z79899 Other long term (current) drug therapy: Secondary | ICD-10-CM

## 2015-11-30 DIAGNOSIS — M797 Fibromyalgia: Secondary | ICD-10-CM | POA: Diagnosis present

## 2015-11-30 DIAGNOSIS — M329 Systemic lupus erythematosus, unspecified: Secondary | ICD-10-CM | POA: Diagnosis present

## 2015-11-30 DIAGNOSIS — Z111 Encounter for screening for respiratory tuberculosis: Secondary | ICD-10-CM

## 2015-11-30 DIAGNOSIS — M069 Rheumatoid arthritis, unspecified: Secondary | ICD-10-CM | POA: Diagnosis present

## 2015-11-30 DIAGNOSIS — L02411 Cutaneous abscess of right axilla: Secondary | ICD-10-CM | POA: Diagnosis present

## 2015-11-30 DIAGNOSIS — Z7689 Persons encountering health services in other specified circumstances: Secondary | ICD-10-CM

## 2015-11-30 DIAGNOSIS — L02429 Furuncle of limb, unspecified: Secondary | ICD-10-CM

## 2015-11-30 HISTORY — DX: Headache: R51

## 2015-11-30 HISTORY — DX: Headache, unspecified: R51.9

## 2015-11-30 HISTORY — DX: Fibromyalgia: M79.7

## 2015-11-30 HISTORY — DX: Rheumatoid arthritis, unspecified: M06.9

## 2015-11-30 LAB — CBC WITH DIFFERENTIAL/PLATELET
Basophils Absolute: 0 10*3/uL (ref 0.0–0.1)
Basophils Relative: 0 %
Eosinophils Absolute: 0.1 10*3/uL (ref 0.0–0.7)
Eosinophils Relative: 1 %
HCT: 39.4 % (ref 36.0–46.0)
Hemoglobin: 12.9 g/dL (ref 12.0–15.0)
Lymphocytes Relative: 8 %
Lymphs Abs: 1.6 10*3/uL (ref 0.7–4.0)
MCH: 27.5 pg (ref 26.0–34.0)
MCHC: 32.7 g/dL (ref 30.0–36.0)
MCV: 84 fL (ref 78.0–100.0)
Monocytes Absolute: 1.2 10*3/uL — ABNORMAL HIGH (ref 0.1–1.0)
Monocytes Relative: 6 %
Neutro Abs: 16.6 10*3/uL — ABNORMAL HIGH (ref 1.7–7.7)
Neutrophils Relative %: 85 %
Platelets: 349 10*3/uL (ref 150–400)
RBC: 4.69 MIL/uL (ref 3.87–5.11)
RDW: 13.7 % (ref 11.5–15.5)
WBC: 19.6 10*3/uL — ABNORMAL HIGH (ref 4.0–10.5)

## 2015-11-30 LAB — BASIC METABOLIC PANEL
Anion gap: 11 (ref 5–15)
BUN: 7 mg/dL (ref 6–20)
CO2: 22 mmol/L (ref 22–32)
Calcium: 9 mg/dL (ref 8.9–10.3)
Chloride: 102 mmol/L (ref 101–111)
Creatinine, Ser: 0.87 mg/dL (ref 0.44–1.00)
GFR calc Af Amer: >60 mL/min (ref >60)
GFR calc non Af Amer: >60 mL/min (ref >60)
Glucose, Bld: 180 mg/dL — ABNORMAL HIGH (ref 65–99)
Potassium: 2.9 mmol/L — ABNORMAL LOW (ref 3.5–5.1)
Sodium: 135 mmol/L (ref 135–145)

## 2015-11-30 LAB — TB SKIN TEST
Induration: 0 mm
TB Skin Test: NEGATIVE

## 2015-11-30 MED ORDER — DULOXETINE HCL 30 MG PO CPEP
30.0000 mg | ORAL_CAPSULE | Freq: Every day | ORAL | Status: DC
  Administered 2015-11-30 – 2015-12-05 (×5): 30 mg via ORAL
  Filled 2015-11-30 (×7): qty 1

## 2015-11-30 MED ORDER — SODIUM CHLORIDE 0.9 % IV SOLN
INTRAVENOUS | Status: AC
  Administered 2015-11-30: 17:00:00 via INTRAVENOUS

## 2015-11-30 MED ORDER — VANCOMYCIN HCL 10 G IV SOLR
2000.0000 mg | Freq: Once | INTRAVENOUS | Status: AC
  Administered 2015-11-30: 2000 mg via INTRAVENOUS
  Filled 2015-11-30: qty 2000

## 2015-11-30 MED ORDER — ENOXAPARIN SODIUM 40 MG/0.4ML ~~LOC~~ SOLN
40.0000 mg | SUBCUTANEOUS | Status: DC
  Administered 2015-11-30: 40 mg via SUBCUTANEOUS
  Filled 2015-11-30: qty 0.4

## 2015-11-30 MED ORDER — VANCOMYCIN HCL IN DEXTROSE 1-5 GM/200ML-% IV SOLN
1000.0000 mg | Freq: Two times a day (BID) | INTRAVENOUS | Status: DC
  Administered 2015-12-01 – 2015-12-05 (×9): 1000 mg via INTRAVENOUS
  Filled 2015-11-30 (×10): qty 200

## 2015-11-30 MED ORDER — LOSARTAN POTASSIUM 50 MG PO TABS: 100.0000 mg | ORAL_TABLET | Freq: Every day | ORAL | Status: DC

## 2015-11-30 MED ORDER — ONDANSETRON HCL 4 MG/2ML IJ SOLN: 4.0000 mg | Freq: Four times a day (QID) | INTRAMUSCULAR | Status: DC | PRN

## 2015-11-30 MED ORDER — ATORVASTATIN CALCIUM 10 MG PO TABS
10.0000 mg | ORAL_TABLET | Freq: Every day | ORAL | Status: DC
  Administered 2015-11-30 – 2015-12-05 (×6): 10 mg via ORAL
  Filled 2015-11-30 (×6): qty 1

## 2015-11-30 MED ORDER — CARVEDILOL 6.25 MG PO TABS
6.2500 mg | ORAL_TABLET | Freq: Two times a day (BID) | ORAL | Status: DC
  Administered 2015-12-01 – 2015-12-05 (×9): 6.25 mg via ORAL
  Filled 2015-11-30 (×9): qty 1

## 2015-11-30 MED ORDER — LOSARTAN POTASSIUM 50 MG PO TABS
100.0000 mg | ORAL_TABLET | Freq: Every day | ORAL | Status: DC
Start: 2015-12-01 — End: 2015-12-05
  Administered 2015-12-01 – 2015-12-05 (×5): 100 mg via ORAL
  Filled 2015-11-30 (×5): qty 2

## 2015-11-30 MED ORDER — HYDROCHLOROTHIAZIDE 25 MG PO TABS
25.0000 mg | ORAL_TABLET | Freq: Every day | ORAL | Status: DC
  Administered 2015-11-30 – 2015-12-05 (×6): 25 mg via ORAL
  Filled 2015-11-30 (×6): qty 1

## 2015-11-30 MED ORDER — OXYCODONE-ACETAMINOPHEN 5-325 MG PO TABS
1.0000 | ORAL_TABLET | ORAL | Status: DC | PRN
Start: 2015-11-30 — End: 2015-12-03
  Administered 2015-11-30 – 2015-12-03 (×4): 1 via ORAL
  Filled 2015-11-30 (×4): qty 1

## 2015-11-30 MED ORDER — AMLODIPINE BESYLATE 5 MG PO TABS
5.0000 mg | ORAL_TABLET | Freq: Every evening | ORAL | Status: DC
  Administered 2015-11-30 – 2015-12-04 (×5): 5 mg via ORAL
  Filled 2015-11-30 (×5): qty 1

## 2015-11-30 NOTE — H&P (Signed)
Family Medicine Teaching [facility] Admission History and Physical Service Pager: [phone]  Patient name: Veronica [name] Medical record number: [id number] Date of birth: [birth year] Age: 55 y.o. Gender: female  Primary Care Provider: Beaulah Dinning, MD Consultants: Surgery Code Status: FULL  Chief Complaint:  Right arm swelling and pain  Assessment and Plan:  55 y/o F with PMH sig for HTN, HLD, SLE presenting with R arm cellulitis.  #Right arm cellulitis Large area of erythema, induration and warmth involving entire upper arm consistent with cellulitis, but firm indurated area concerning for abscess. Recent I&D with persistent pain, swelling now with elevated temp concerning for worsening infection -received one dose of doxycycline 100mg  this am -Percocet 5-325 mg po Q4 prn for pain -IV Vancomycin per pharm -Bcx ordered x2 -Soft tissue U/S- if + for abscess with consult surgery for I&d -BMP, CBC  #Nausea- stable, no current nausea able to tolerate PO - Zofran prn  #Left axilla boil/hydradenitis suppurativa -has another boil under left arm, about 1/4 inch open wound with some serous drainage. Tender to touch but not warm. -consider I&D while inpatient  #Hypertension on admission 148/85 -continue home Amlodipine 5mg  daily -continue home HCTZ 25mg  daily -continue home losartan 100 mg -continue home Carvedilol 6.25mg  po bid  #Hyperlipidemia -continue home Atorvastatin 10mg  po daily  # H/O migraine- no current headache -home Excedrin Q6 prn -home Ibuprofen 800mg  Q6 prn - on duloxetine 30 qD- started for concern that her chronic headaches were in part contributed to by her fibromyalgia on 02/03/2015  #FEN/GI: Diet Heart, IV NS x12 hr #Prophylaxis: Lovenox   Disposition: Admitted to inpatient service for IV antibiotics and treatment of her cellulitis, attending Dr Pollie Meyer  History of Present Illness:   Veronica [name] is a 55 y.o. female presenting with right  arm cellulitis that began a week ago. PMH is significant for lupus, htn, hyperlipidemia, and hydradenitis suppurativa. She first noticed a small area of swelling that spread throughout her entire upper arm. First noticed last Wednesday, got bigger during the week, saw a doctor at [facility] on Monday, 7/31 and they drained a boil she had. She has a history of boils and describes her boils as brownish red. Following the draining she felt naueous and very tired. She also had a PPD test on Monday, read as negative. She was prescribed her Doxycycline 100 mg on 7/31 for 10 days, however patient was unable to fill prescription until this morning 11/30/15. This morning she presented to clinic with a temperature of 99.5 and chills. She had vomited several times, non-bloody non-bilious emesis. She also complains of a headache localized to the front of her head, now resolved   She denies any hx of insect bites to the area, recent travel, and trauma to the arm. She also denies recent illnesses, rashes, diarrhea, fevers.    Review Of Systems: Per HPI, otherwise the remainder of the systems were negative.  Patient Active Problem List   Diagnosis Date Noted  . Cellulitis 11/30/2015  . Hidradenitis suppurativa 11/28/2015  . HLD (hyperlipidemia) 06/14/2015  . Morbid obesity (HCC) 06/14/2015  . Health care maintenance 02/21/2015  . Leg swelling 10/10/2014  . Insomnia 12/15/2012  . Resistant hypertension 04/10/2012  . SLE (systemic lupus erythematosus) (HCC) 04/10/2012    Past Medical History: Past Medical History:  Diagnosis Date  . Arthritis   . Headache(784.0)   . History of blood transfusion   . Hx of dizziness   . Hypertension   . SLE (  systemic lupus erythematosus) (HCC) 1998    Past Surgical History: Past Surgical History:  Procedure Laterality Date  . ABDOMINAL HYSTERECTOMY  09/2008  . CESAREAN SECTION  1992, 1995  . CHOLECYSTECTOMY N/A 07/18/2012   Procedure: LAPAROSCOPIC CHOLECYSTECTOMY WITH  INTRAOPERATIVE CHOLANGIOGRAM;  Surgeon: Atilano Ina, MD;  Location: [facility] OR;  Service: General;  Laterality: N/A;  . ERCP N/A 07/29/2012   Procedure: ENDOSCOPIC RETROGRADE CHOLANGIOPANCREATOGRAPHY (ERCP);  Surgeon: Theda Belfast, MD;  Location: [facility];  Service: Endoscopy;  Laterality: N/A;  Dr. Elnoria Howard said he would start this PT arond 1330( AW)    Social History: Social History  Substance Use Topics  . Smoking status: Never Smoker  . Smokeless tobacco: Never Used  . Alcohol use No    Family History: Family History  Problem Relation Age of Onset  . Diabetes Mother   . Multiple sclerosis Sister   . Hypertension Father   . Hypertension Brother     Allergies and Medications: Allergies  Allergen Reactions  . Tessalon [Benzonatate] Other (See Comments)    Lip and eye swelling   No current facility-administered medications on file prior to encounter.    Current Outpatient Prescriptions on File Prior to Encounter  Medication Sig Dispense Refill  . amLODipine (NORVASC) 5 MG tablet Take 1 tablet (5 mg total) by mouth every evening. 30 tablet 11  . aspirin-acetaminophen-caffeine (EXCEDRIN MIGRAINE) 250-250-65 MG tablet Take 1 tablet by mouth every 6 (six) hours as needed for headache. Reported on 04/19/2015    . atorvastatin (LIPITOR) 10 MG tablet TAKE ONE TABLET BY MOUTH ONCE DAILY 30 tablet 3  . carvedilol (COREG) 6.25 MG tablet Take 1 tablet (6.25 mg total) by mouth 2 (two) times daily with a meal. 60 tablet 2  . cetirizine (ZYRTEC) 10 MG tablet Take 10 mg by mouth as needed for allergies.    . clindamycin (CLINDAGEL) 1 % gel Apply topically 2 (two) times daily as needed. Please use for boils as needed. 30 g 2  . DULoxetine (CYMBALTA) 30 MG capsule Take 1 capsule (30 mg total) by mouth daily. 30 capsule 3  . fluticasone (FLONASE) 50 MCG/ACT nasal spray Place 2 sprays into both nostrils daily. (Patient taking differently: Place 2 sprays into both nostrils daily as needed for  allergies. ) 16 g 0  . hydrochlorothiazide (HYDRODIURIL) 25 MG tablet TAKE ONE TABLET BY MOUTH DAILY. 90 tablet 0  . ibuprofen (ADVIL,MOTRIN) 200 MG tablet Take 800 mg by mouth every 6 (six) hours as needed for headache. Reported on 04/19/2015    . losartan (COZAAR) 100 MG tablet TAKE ONE TABLET BY MOUTH ONCE DAILY 90 tablet 1  . naproxen (EC NAPROSYN) 500 MG EC tablet Take 1 tablet (500 mg total) by mouth 2 (two) times daily with a meal. (Patient taking differently: Take 500 mg by mouth 2 (two) times daily as needed. ) 10 tablet 1  . potassium chloride (K-DUR) 10 MEQ tablet Take 1 tablet (10 mEq total) by mouth daily. 90 tablet 0  . doxycycline (VIBRA-TABS) 100 MG tablet Take 1 tablet (100 mg total) by mouth 2 (two) times daily. (Patient not taking: Reported on 11/30/2015) 20 tablet 0    Objective: BP (!) 148/85 (BP Location: Left Arm)   Pulse 84   Temp 98.1 F (36.7 C) (Oral)   Resp 18   Ht 5\' 4"  (1.626 m)   Wt 238 lb (108 kg)   SpO2 100%   BMI 40.85 kg/m    Exam: General:  Well developed, well nourished, in moderate pain but cooperative, NAD Eyes: PERRL, EOMI ENTM: Moist mucous membranes, no lesions, pharynx without erythema, edema or exudate Neck: supple, no masses Cardiovascular: Heart sounds are normal.  Regular rate and rhythm without murmur, gallop or rub. Respiratory: CTA B/L no rales rhonchi or wheezing Abdomen: Soft, non-tender, +BS; no bruits, organomegaly or masses. MSK: right arm with limited ROM, tender, muscular strength intact Skin: Right upper arm and axilla warm, erythematous with induration, tender to palpation, acutely painful with arm movement area marked off with black pen to monitor spread Neuro: Alert and oriented, normal. strength and sensation  Labs and Imaging:  Lab Results  Component Value Date   CHOL 175 06/14/2015   HDL 44 (L) 06/14/2015   LDLCALC 118 06/14/2015   TRIG 67 06/14/2015   CHOLHDL 4.0 06/14/2015     Freddrick March, MD 11/30/2015, 5:58  PM PGY-1,  Family Medicine FPTS Intern pager: [phone], text pages welcome     I have seen and examined the patient. I have read and agree with the above note. My changes are noted in blue.  Zaylah Blecha A. Kennon Rounds MD, MS Family Medicine Resident PGY-2 Pager [phone]

## 2015-11-30 NOTE — Progress Notes (Addendum)
Pharmacy Antibiotic Note  Veronica [name] is a 55 y.o. female admitted on 11/30/2015 with subacute right axilla/arm cellulitis. Abscess drained on 7/31. PO Doxycycline prescribed 7/31 but patient reports not taking this. Pharmacy has been consulted for Vancomycin dosing for cellulitis.  She is afebrile.  CBC and BMET pending.  Previous SCr value = 0.89 but this is from 03/04/2015.  Plan:ADDENDUM:  Give first dose of Vancomycin 2g IV x1 loading dose. F/u on SCr result on admit labs then determine further vancomycin dosages.  Monitor clinical status, renal function, cultures daily and check vancomycin trough at steady state if necessary.   Height: 5\' 4"  (162.6 cm) Weight: 238 lb (108 kg) IBW/kg (Calculated) : 54.7  Temp (24hrs), Avg:98.4 F (36.9 C), Min:98.1 F (36.7 C), Max:98.7 F (37.1 C)  No results for input(s): WBC, CREATININE, LATICACIDVEN, VANCOTROUGH, VANCOPEAK, VANCORANDOM, GENTTROUGH, GENTPEAK, GENTRANDOM, TOBRATROUGH, TOBRAPEAK, TOBRARND, AMIKACINPEAK, AMIKACINTROU, AMIKACIN in the last 168 hours.  CrCl cannot be calculated (Patient's most recent lab result is older than the maximum 21 days allowed.).    Allergies  Allergen Reactions  . Tessalon [Benzonatate] Other (See Comments)    Lip and eye swelling    Antimicrobials this admission: PO Doxycycline  prescribed 7/31 but patient reports not taking. Vancomycin 8/2 >>   Dose adjustments this admission: None at this time  Microbiology results: 8/2 BCx: sent  Thank you for allowing pharmacy to be a part of this patient's care. 8/31, RPh Clinical Pharmacist Pager: [phone] 11/30/2015 6:01 PM   ADDENDUM:  Admit lab SCr = 0.87, BUn 7 Estimated CrCl ~ 90 ml/min  Plan: After first vancomycin dose given then will give vancomycin  1g IV q12h. Monitor clinical status, renal function, cultures daily and check vancomycin trough at steady state if necessary.     01/30/2016, RPh Clinical Pharmacist Pager:  [phone] 11/30/2015 8:29 PM

## 2015-11-30 NOTE — Progress Notes (Signed)
   PPD Reading Note PPD read and results entered in EpicCare. Result: 0 mm induration. Interpretation: Negative If test not read within 48-72 hours of initial placement, patient advised to repeat in other arm 1-3 weeks after this test. Allergic reaction: no  Patient was seen outside in her car.  Patient had vomited several times.  Upper right arm was swollen and red.  Patient reported she had a boil removed on 11/28/15.  Advised patient she should be seen by a provider.  Patient had low grade time 99.5 orally.  Patient made appt for this afternoon 1:30, she wanted to go home and clean herself up before seeing the provider.    Clovis Pu, RN

## 2015-11-30 NOTE — Progress Notes (Signed)
   Date of Visit: 11/30/2015   HPI:  Patient presents for a same day appointment to discuss right arm pain, redness, and vomiting. Patient presented this morning to clinic with worsening right arm pain and redness. While in clinic patient had one episode NBNB emesis, with a temperature taken at 99.65F. Patient endorse some chills overnight. Patient had a boil in her right axilla that was I&D on Monday. Patient was prescribed doxycycline 100 mg for 10 days, however patient was unable to fill prescription until this morning 11/30/15. Patient had one episode of emesis after taking her first dose of doxy but did not see the pill in emesis. Patient denies fever, diarrhea, headaches, palpitations, shortness of breath and chest pain.  ROS:  Review of Systems  Constitutional: Positive for chills and fever.  Respiratory: Negative for cough and shortness of breath.   Cardiovascular: Negative for palpitations.  Gastrointestinal: Positive for nausea and vomiting. Negative for abdominal pain and diarrhea.  Skin: Negative for rash.  Neurological: Negative for headaches.  All other systems reviewed and are negative.   PMFSH: HTN, SLE, HLD, headaches, possible Hidradenitis suppurativa (recurrent axilla abscesses bilaterally   PHYSICAL EXAM: BP 133/70 (BP Location: Left Arm, Patient Position: Sitting)   Pulse 89   Temp 98.7 F (37.1 C) (Oral)   Wt 238 lb (108 kg)   BMI 40.85 kg/m  Physical Exam: General Appearance:  Well developed, well nourished, in moderate pain but cooperative Head/face:  NCAT Eyes:  PERRL and EOMI Mouth/Throat:  Mucosa moist, no lesions; pharynx without erythema, edema or exudate. Neck:  Supple, no mass, non-tender, no bruits, no jvd, Adenopathy- absent Lungs:  Normal expansion.  Clear to auscultation.  No rales, rhonchi, or wheezing. Heart:  Heart sounds are normal.  Regular rate and rhythm without murmur, gallop or rub. Heart sounds: Normal S1, S2 Abdomen:  Soft, non-tender,  normal bowel sounds; no bruits, organomegaly or masses. Skin:  Right arm erythema, possible area of fluctuance Extremities: Right Arm and Axilla warm to touch, with large erythematous area around axilla. Developing abscess left axilla, redness without drainage. Musculoskeletal:  Right arm with limited ROM 2/2 pain. Muscular strength intact. Neurologic:  Alert and oriented x 3, gait normal., reflexes normal and symmetric, strength and  sensation grossly normal Psych exam: Alert,oriented, in NAD with a full range of affect, normal behavior and no psychotic features  ASSESSMENT/PLAN: Mrs. Diver is a 55yo female with a past medical history significant for SLE HTN, HLD, Headache, Hidradenitis suppurativa  presenting for right arm pain, redness, swelling and emesis consistent with cellulitis.  #Right axilla/arm cellulitis, subacute This afternoon, patient vitals are within normal limit, but pain, swelling and redness have worsened. Abscess drained on 7/31 has no associated drainage. Clinical presentation consistent with cellulitis.Patient does not meet SIRS criteria, but will closely monitor due to rapid spread of erythema, and possible fluctuance on exam concerning for collection. Of note, patient has a developing boil/abscess in her left axilla that will need to be assess for possible I&D while inpatient. --Direct Admit to FMTS --Start patient on Vancomycin --Blood cultures x2 --[place] upper extremities R arm and axilla --Percocet 5-325mg  for moderate pain --NS 75cc/hr for 12hr, reevaluate  --Heart healthy diet  FOLLOW UP: Direct admit to FMTS  Lovena Neighbours, MD [facility]

## 2015-12-01 DIAGNOSIS — L03113 Cellulitis of right upper limb: Principal | ICD-10-CM

## 2015-12-01 DIAGNOSIS — L03111 Cellulitis of right axilla: Secondary | ICD-10-CM

## 2015-12-01 DIAGNOSIS — L732 Hidradenitis suppurativa: Secondary | ICD-10-CM

## 2015-12-01 LAB — BASIC METABOLIC PANEL
Anion gap: 9 (ref 5–15)
BUN: 7 mg/dL (ref 6–20)
CO2: 22 mmol/L (ref 22–32)
Calcium: 8.7 mg/dL — ABNORMAL LOW (ref 8.9–10.3)
Chloride: 103 mmol/L (ref 101–111)
Creatinine, Ser: 0.83 mg/dL (ref 0.44–1.00)
GFR calc Af Amer: >60 mL/min (ref >60)
GFR calc non Af Amer: >60 mL/min (ref >60)
Glucose, Bld: 144 mg/dL — ABNORMAL HIGH (ref 65–99)
Potassium: 3 mmol/L — ABNORMAL LOW (ref 3.5–5.1)
Sodium: 134 mmol/L — ABNORMAL LOW (ref 135–145)

## 2015-12-01 LAB — CBC
HCT: 37.1 % (ref 36.0–46.0)
Hemoglobin: 12 g/dL (ref 12.0–15.0)
MCH: 27.1 pg (ref 26.0–34.0)
MCHC: 32.3 g/dL (ref 30.0–36.0)
MCV: 83.7 fL (ref 78.0–100.0)
Platelets: 360 10*3/uL (ref 150–400)
RBC: 4.43 MIL/uL (ref 3.87–5.11)
RDW: 13.8 % (ref 11.5–15.5)
WBC: 18.9 10*3/uL — ABNORMAL HIGH (ref 4.0–10.5)

## 2015-12-01 LAB — SURGICAL PCR SCREEN
MRSA, PCR: POSITIVE — AB
Staphylococcus aureus: POSITIVE — AB

## 2015-12-01 MED ORDER — POTASSIUM CHLORIDE CRYS ER 20 MEQ PO TBCR
40.0000 meq | EXTENDED_RELEASE_TABLET | Freq: Two times a day (BID) | ORAL | Status: AC
  Administered 2015-12-01 (×2): 40 meq via ORAL
  Filled 2015-12-01 (×2): qty 2

## 2015-12-01 MED ORDER — ENOXAPARIN SODIUM 60 MG/0.6ML ~~LOC~~ SOLN
0.5000 mg/kg | SUBCUTANEOUS | Status: DC
  Administered 2015-12-01 – 2015-12-04 (×4): 55 mg via SUBCUTANEOUS
  Filled 2015-12-01 (×5): qty 0.6

## 2015-12-01 MED ORDER — SODIUM CHLORIDE 0.9 % IV SOLN
INTRAVENOUS | Status: AC
  Administered 2015-12-01: 21:00:00 via INTRAVENOUS

## 2015-12-01 MED ORDER — DIPHENHYDRAMINE HCL 25 MG PO CAPS
25.0000 mg | ORAL_CAPSULE | Freq: Four times a day (QID) | ORAL | Status: DC | PRN
  Administered 2015-12-01 – 2015-12-03 (×3): 25 mg via ORAL
  Filled 2015-12-01 (×3): qty 1

## 2015-12-01 MED ORDER — LIDOCAINE-EPINEPHRINE 1 %-1:100000 IJ SOLN
10.0000 mL | Freq: Once | INTRAMUSCULAR | Status: DC
  Filled 2015-12-01: qty 10

## 2015-12-01 MED ORDER — MORPHINE SULFATE (PF) 2 MG/ML IV SOLN
2.0000 mg | INTRAVENOUS | Status: DC | PRN
  Administered 2015-12-01 – 2015-12-02 (×2): 2 mg via INTRAVENOUS
  Filled 2015-12-01 (×2): qty 1

## 2015-12-01 MED ORDER — SODIUM CHLORIDE 0.9% FLUSH
10.0000 mL | INTRAVENOUS | Status: DC | PRN
Start: 2015-12-01 — End: 2015-12-05
  Administered 2015-12-03: 10 mL
  Filled 2015-12-01: qty 40

## 2015-12-01 NOTE — Consult Note (Signed)
[facility] Surgery Consult Note  Veronica [name] [birth year]  [id number].    Requesting MD: Dr. Randolm Idol  Chief Complaint/Reason for Consult: Right axillary abscess HPI:  55 y/o AA female with HTN, HLD, Lupus, and hydradenitis (diagnosed may 2017) presented to [facility] from a primary care clinic with R axillary abscess, pain, and fever/chills. States that she noticed a boil under her right arm one week ago that got progressively larger and more painful. She went to a family doctor [facility]) on 7/31 where the boil was drained and she was prescribed doxycycline. She did not fill her prescription for antibiotics until this morning, but when she developed a fever yesterday morning she went to the clinic and was sent to the ED. She states that she has had multiple axillary boils over the past few months that have resolved with I&D and antibiotics prescribed by her PCP. She denies HA, CP, SOB, abdominal pain, nausea, vomiting, and diarrhea at this time.  ROS: All systems reviewed and otherwise negative except for as above  Family History  Problem Relation Age of Onset  . Diabetes Mother   . Hypertension Father   . Multiple sclerosis Sister   . Hypertension Brother     Past Medical History:  Diagnosis Date  . Daily headache   . Fibromyalgia   . History of blood transfusion 2010   "before hysterectomy"  . Hx of dizziness   . Hypertension   . Pneumonia 06/2014; 07/2014  . Rheumatoid arthritis (HCC)   . SLE (systemic lupus erythematosus) (HCC) 1998    Past Surgical History:  Procedure Laterality Date  . ABDOMINAL HYSTERECTOMY  09/2008  . CESAREAN SECTION  1992, 1995  . CHOLECYSTECTOMY N/A 07/18/2012   Procedure: LAPAROSCOPIC CHOLECYSTECTOMY WITH INTRAOPERATIVE CHOLANGIOGRAM;  Surgeon: Atilano Ina, MD;  Location: [facility] OR;  Service: General;  Laterality: N/A;  . ERCP N/A 07/29/2012   Procedure: ENDOSCOPIC RETROGRADE CHOLANGIOPANCREATOGRAPHY (ERCP);  Surgeon: Theda Belfast, MD;  Location: [facility]  ENDOSCOPY;  Service: Endoscopy;  Laterality: N/A;  Dr. Elnoria Howard said he would start this PT arond 1330( AW)  . INCISION AND DRAINAGE Right 11/28/2015   "boil on upper arm; did this in dr's office"  . TUBAL LIGATION  1995    Social History:  reports that she has never smoked. She has never used smokeless tobacco. She reports that she does not drink alcohol or use drugs.  Allergies:  Allergies  Allergen Reactions  . Tessalon [Benzonatate] Other (See Comments)    Lip and eye swelling    Medications Prior to Admission  Medication Sig Dispense Refill  . amLODipine (NORVASC) 5 MG tablet Take 1 tablet (5 mg total) by mouth every evening. 30 tablet 11  . aspirin-acetaminophen-caffeine (EXCEDRIN MIGRAINE) 250-250-65 MG tablet Take 1 tablet by mouth every 6 (six) hours as needed for headache. Reported on 04/19/2015    . atorvastatin (LIPITOR) 10 MG tablet TAKE ONE TABLET BY MOUTH ONCE DAILY 30 tablet 3  . carvedilol (COREG) 6.25 MG tablet Take 1 tablet (6.25 mg total) by mouth 2 (two) times daily with a meal. 60 tablet 2  . cetirizine (ZYRTEC) 10 MG tablet Take 10 mg by mouth as needed for allergies.    . clindamycin (CLINDAGEL) 1 % gel Apply topically 2 (two) times daily as needed. Please use for boils as needed. 30 g 2  . DULoxetine (CYMBALTA) 30 MG capsule Take 1 capsule (30 mg total) by mouth daily. 30 capsule 3  . fluticasone (FLONASE) 50 MCG/ACT nasal  spray Place 2 sprays into both nostrils daily. (Patient taking differently: Place 2 sprays into both nostrils daily as needed for allergies. ) 16 g 0  . hydrochlorothiazide (HYDRODIURIL) 25 MG tablet TAKE ONE TABLET BY MOUTH DAILY. 90 tablet 0  . ibuprofen (ADVIL,MOTRIN) 200 MG tablet Take 800 mg by mouth every 6 (six) hours as needed for headache. Reported on 04/19/2015    . losartan (COZAAR) 100 MG tablet TAKE ONE TABLET BY MOUTH ONCE DAILY 90 tablet 1  . naproxen (EC NAPROSYN) 500 MG EC tablet Take 1 tablet (500 mg total) by mouth 2 (two) times  daily with a meal. (Patient taking differently: Take 500 mg by mouth 2 (two) times daily as needed. ) 10 tablet 1  . potassium chloride (K-DUR) 10 MEQ tablet Take 1 tablet (10 mEq total) by mouth daily. 90 tablet 0  . doxycycline (VIBRA-TABS) 100 MG tablet Take 1 tablet (100 mg total) by mouth 2 (two) times daily. (Patient not taking: Reported on 11/30/2015) 20 tablet 0    Blood pressure 131/66, pulse 85, temperature 98.6 F (37 C), temperature source Oral, resp. rate 17, height 5\' 4"  (1.626 m), weight 108 kg (238 lb), SpO2 97 %. Physical Exam: General: pleasant, obese AA female, sitting up in a chair HEENT: head is normocephalic, atraumatic. Ears and nose without any masses or lesions.  Mouth is pink and moist Heart: regular, rate, and rhythm.  No obvious murmurs, gallops, or rubs noted.  Palpable pedal pulses bilaterally Lungs: CTAB, no wheezes, rhonchi, or rales noted.  Respiratory effort nonlabored Abd: soft, NT/ND, +BS, no masses, hernias, or organomegaly MS: all 4 extremities are symmetrical with no cyanosis, clubbing, or edema. Skin: warm and dry - R axillary abscess draining from axillary fold, surrounded by significant erythema, warmth, and moderate induration.   Left axillary boil ~ 2 cm without surrounding erythema or induration.    Psych: A&Ox3 with an appropriate affect. Neuro: normal speech  Results for orders placed or performed during the hospital encounter of 11/30/15 (from the past 48 hour(s))  Basic metabolic panel     Status: Abnormal   Collection Time: 11/30/15  7:20 PM  Result Value Ref Range   Sodium 135 135 - 145 mmol/L   Potassium 2.9 (L) 3.5 - 5.1 mmol/L   Chloride 102 101 - 111 mmol/L   CO2 22 22 - 32 mmol/L   Glucose, Bld 180 (H) 65 - 99 mg/dL   BUN 7 6 - 20 mg/dL   Creatinine, Ser 6.21 0.44 - 1.00 mg/dL   Calcium 9.0 8.9 - 30.8 mg/dL   GFR calc non Af Amer >60 >60 mL/min   GFR calc Af Amer >60 >60 mL/min    Comment: (NOTE) The eGFR has been calculated  using the CKD EPI equation. This calculation has not been validated in all clinical situations. eGFR's persistently <60 mL/min signify possible Chronic Kidney Disease.    Anion gap 11 5 - 15  CBC with Differential/Platelet     Status: Abnormal   Collection Time: 11/30/15  7:20 PM  Result Value Ref Range   WBC 19.6 (H) 4.0 - 10.5 K/uL   RBC 4.69 3.87 - 5.11 MIL/uL   Hemoglobin 12.9 12.0 - 15.0 g/dL   HCT 65.7 84.6 - 96.2 %   MCV 84.0 78.0 - 100.0 fL   MCH 27.5 26.0 - 34.0 pg   MCHC 32.7 30.0 - 36.0 g/dL   RDW 95.2 84.1 - 32.4 %   Platelets 349 150 -  400 K/uL   Neutrophils Relative % 85 %   Neutro Abs 16.6 (H) 1.7 - 7.7 K/uL   Lymphocytes Relative 8 %   Lymphs Abs 1.6 0.7 - 4.0 K/uL   Monocytes Relative 6 %   Monocytes Absolute 1.2 (H) 0.1 - 1.0 K/uL   Eosinophils Relative 1 %   Eosinophils Absolute 0.1 0.0 - 0.7 K/uL   Basophils Relative 0 %   Basophils Absolute 0.0 0.0 - 0.1 K/uL  CBC     Status: Abnormal   Collection Time: 12/01/15  2:26 AM  Result Value Ref Range   WBC 18.9 (H) 4.0 - 10.5 K/uL   RBC 4.43 3.87 - 5.11 MIL/uL   Hemoglobin 12.0 12.0 - 15.0 g/dL   HCT 78.4 69.6 - 29.5 %   MCV 83.7 78.0 - 100.0 fL   MCH 27.1 26.0 - 34.0 pg   MCHC 32.3 30.0 - 36.0 g/dL   RDW 28.4 13.2 - 44.0 %   Platelets 360 150 - 400 K/uL  Basic metabolic panel     Status: Abnormal   Collection Time: 12/01/15  2:26 AM  Result Value Ref Range   Sodium 134 (L) 135 - 145 mmol/L   Potassium 3.0 (L) 3.5 - 5.1 mmol/L   Chloride 103 101 - 111 mmol/L   CO2 22 22 - 32 mmol/L   Glucose, Bld 144 (H) 65 - 99 mg/dL   BUN 7 6 - 20 mg/dL   Creatinine, Ser 1.02 0.44 - 1.00 mg/dL   Calcium 8.7 (L) 8.9 - 10.3 mg/dL   GFR calc non Af Amer >60 >60 mL/min   GFR calc Af Amer >60 >60 mL/min    Comment: (NOTE) The eGFR has been calculated using the CKD EPI equation. This calculation has not been validated in all clinical situations. eGFR's persistently <60 mL/min signify possible Chronic  Kidney Disease.    Anion gap 9 5 - 15   [place] Chest  Result Date: 11/30/2015 CLINICAL DATA:  55 year old female with right axillary cellulitis. EXAM: CHEST ULTRASOUND COMPARISON:  Right upper extremity ultrasound from today reported separately. Chest CT 12/09/2014. FINDINGS: Ultrasound imaging in the right axillary region directly over of visible furuncle reveals a 1.2 cm diamond-shaped area of complex nonvascular material located about 5 mm below the skin surface (images 3 and 4). Surrounding diffuse soft tissue edema. No other organized or drainable fluid collection. IMPRESSION: 1. A 1.2 cm complex subcutaneous collection compatible with abscess is located 5 mm below the skin surface at the visible furuncle. 2. No other organized or drainable fluid collection. Electronically Signed   By: Odessa Fleming M.D.   On: 11/30/2015 22:48   [place] Extrem Up Right Ltd  Result Date: 11/30/2015 CLINICAL DATA:  55 year old female with right axillary cellulitis. Initial encounter. EXAM: ULTRASOUND RIGHT UPPER EXTREMITY LIMITED TECHNIQUE: Ultrasound examination of the upper extremity soft tissues was performed in the area of clinical concern. COMPARISON:  None FINDINGS: Ultrasound images demonstrate diffuse subcutaneous edema in the area of imaging. No organized or drainable fluid collection. IMPRESSION: Diffuse soft tissue edema with no organized or drainable fluid collection. Electronically Signed   By: Odessa Fleming M.D.   On: 11/30/2015 22:43   Assessment/Plan Hidradenitis suppurativa  Sub-acute right axillary abscess - Diagnosed may 2017.  - U/S shows diffuse edema of soft tissue without organized fluid collection.  - continue IV vanc  FEN: NPO ID: vancomycin day #2 DVT Proph: SCD's Plan: OR vs bedside incision and drainage. Based on U/S report  there is not a large enough fluid collection that warrants OR drainage, however on exam I am concerned about the progression of the infection from the axilla tracking laterally down  the inferior aspect of the upper arm. Will discuss with MD. Continue NPO with IV abx.    Adam Phenix, [facility] Surgery 12/01/2015, 10:54 AM Pager: [phone] Consults: [phone] Mon-Fri 7:00 am-4:30 pm Sat-Sun 7:00 am-11:30 am

## 2015-12-01 NOTE — Progress Notes (Signed)
Peripherally Inserted Central Catheter/Midline Placement  The IV Nurse has discussed with the patient and/or persons authorized to consent for the patient, the purpose of this procedure and the potential benefits and risks involved with this procedure.  The benefits include less needle sticks, lab draws from the catheter and patient may be discharged home with the catheter.  Risks include, but not limited to, infection, bleeding, blood clot (thrombus formation), and puncture of an artery; nerve damage and irregular heat beat.  Alternatives to this procedure were also discussed.  PICC/Midline Placement Documentation        Veronique Warga, Lajean Manes 12/01/2015, 4:42 PM

## 2015-12-01 NOTE — Progress Notes (Signed)
Family Medicine Teaching Service Daily Progress Note Intern Pager: [phone]  Patient name: Veronica [name] Medical record number: [id number] Date of birth: [birth year] Age: 55 y.o. Gender: female  Primary Care Provider: Beaulah Dinning, MD Consultants: Surgery Code Status: FULL  Pt Overview and Major Events to Date:  Vancomycin start 8/2  Assessment and Plan:  55 y/o F with PMH sig for HTN, HLD, SLE presenting with R arm cellulitis.  #Right arm cellulitis Large area of erythema, induration and warmth involving entire upper arm consistent with cellulitis, but firm indurated area concerning for abscess. Recent I&D with persistent pain, swelling now with elevated temp concerning for worsening infection -received one dose of doxycycline 100mg  yesterday am -Percocet 5-325 mg po Q4 prn for pain -IV Vancomycin (Day 2) per pharm -Bcx ordered x2 in process -Soft tissue U/S: shows 1.2 cm complex subcutaneous fluid collection compatible with abscess loc 5mm below skin surface at visible furuncle.  -Consulted Surgery for I&D, will likely be done today. -BMP Na+ 134, K+ 3.0, CBC wbc 18.9, rest wnl  #Nausea- stable, no current nausea able to tolerate PO - Zofran prn  #Left axilla boil/hydradenitis suppurativa -has another boil under left arm, about 1/4 inch open wound with some serous drainage. Tender to touch but not warm. -consider I&D while inpatient  #Hypokalemia -K+ 3.0 -Kdur po bid started  -replete as needed and monitor BMPs  #Hypertension on admission 148/85 -continue home Amlodipine 5mg  daily -continue home HCTZ 25mg  daily -continue home losartan 100 mg -continue home Carvedilol 6.25mg  po bid  #Hyperlipidemia -continue home Atorvastatin 10mg  po daily  # H/O migraine- no current headache -home Excedrin Q6 prn -home Ibuprofen 800mg  Q6 prn - on duloxetine 30 qD- started for concern that her chronic headaches were in part contributed to by her fibromyalgia on  02/03/2015  #FEN/GI: Diet Heart, IV NS x12 hr #Prophylaxis: Lovenox    Disposition:  Admitted to inpatient service for IV antibiotics and treatment of her cellulitis, attending Dr Pollie Meyer. Surgery consulted and likely will I&D the abscess.  Subjective:  Patient states the swelling in her right upper arm has reduced and that her pain is being well controlled. She has no current complaints. VSS and afebrile overnight. Infection has not spread past previously marked borders.   Objective: Temp:  [98.6 F (37 C)-99.9 F (37.7 C)] 98.7 F (37.1 C) (08/03 1438) Pulse Rate:  [83-94] 83 (08/03 1438) Resp:  [17-18] 18 (08/03 1438) BP: (122-131)/(62-68) 131/68 (08/03 1438) SpO2:  [96 %-98 %] 96 % (08/03 1438)   Physical Exam: General: Well developed, well nourished, in moderate pain but cooperative, NAD Eyes: PERRL, EOMI ENTM: Moist mucous membranes, no lesions, pharynx without erythema, edema or exudate Neck: supple, no masses Cardiovascular: Heart sounds are normal. Regular rate and rhythm without murmur, gallop or rub. Respiratory: CTA B/L no rales rhonchi or wheezing Abdomen: Soft, non-tender, +BS; no bruits, organomegaly or masses. MSK: right arm with limited ROM, tender, muscular strength intact Skin: Right upper arm and axilla warm, erythematous with induration, tender to palpation, acutely painful with arm movement area marked off with black pen to monitor spread Neuro: Alert and oriented, normal. strength and sensation  Laboratory:  Recent Labs Lab 11/30/15 1920 12/01/15 0226  WBC 19.6* 18.9*  HGB 12.9 12.0  HCT 39.4 37.1  PLT 349 360    Recent Labs Lab 11/30/15 1920 12/01/15 0226  NA 135 134*  K 2.9* 3.0*  CL 102 103  CO2 22 22  BUN 7  7  CREATININE 0.87 0.83  CALCIUM 9.0 8.7*  GLUCOSE 180* 144*    Imaging/Diagnostic Tests:  [place] Chest  Result Date: 11/30/2015 CLINICAL DATA:  55 year old female with right axillary cellulitis. EXAM: CHEST ULTRASOUND  COMPARISON:  Right upper extremity ultrasound from today reported separately. Chest CT 12/09/2014. FINDINGS: Ultrasound imaging in the right axillary region directly over of visible furuncle reveals a 1.2 cm diamond-shaped area of complex nonvascular material located about 5 mm below the skin surface (images 3 and 4). Surrounding diffuse soft tissue edema. No other organized or drainable fluid collection. IMPRESSION: 1. A 1.2 cm complex subcutaneous collection compatible with abscess is located 5 mm below the skin surface at the visible furuncle. 2. No other organized or drainable fluid collection. Electronically Signed   By: Odessa Fleming M.D.   On: 11/30/2015 22:48   [place] Extrem Up Right Ltd  Result Date: 11/30/2015 CLINICAL DATA:  55 year old female with right axillary cellulitis. Initial encounter. EXAM: ULTRASOUND RIGHT UPPER EXTREMITY LIMITED TECHNIQUE: Ultrasound examination of the upper extremity soft tissues was performed in the area of clinical concern. COMPARISON:  None FINDINGS: Ultrasound images demonstrate diffuse subcutaneous edema in the area of imaging. No organized or drainable fluid collection. IMPRESSION: Diffuse soft tissue edema with no organized or drainable fluid collection. Electronically Signed   By: Odessa Fleming M.D.   On: 11/30/2015 22:43     Freddrick March, MD 12/01/2015, 4:52 PM PGY-1, [place] [facility] FPTS Intern pager: [phone], text pages welcome

## 2015-12-02 ENCOUNTER — Inpatient Hospital Stay (HOSPITAL_COMMUNITY): Payer: BLUE CROSS/BLUE SHIELD | Admitting: Anesthesiology

## 2015-12-02 ENCOUNTER — Encounter (HOSPITAL_COMMUNITY)
Admission: AD | Disposition: A | Discharge status: Home / Self Care | Payer: Self-pay | Source: Ambulatory Visit | Attending: Family Medicine

## 2015-12-02 ENCOUNTER — Encounter (HOSPITAL_COMMUNITY): Payer: Self-pay | Admitting: Anesthesiology

## 2015-12-02 DIAGNOSIS — L03111 Cellulitis of right axilla: Secondary | ICD-10-CM

## 2015-12-02 HISTORY — PX: INCISION AND DRAINAGE ABSCESS: SHX5864

## 2015-12-02 LAB — BASIC METABOLIC PANEL
Anion gap: 8 (ref 5–15)
BUN: 6 mg/dL (ref 6–20)
CO2: 25 mmol/L (ref 22–32)
Calcium: 8.7 mg/dL — ABNORMAL LOW (ref 8.9–10.3)
Chloride: 104 mmol/L (ref 101–111)
Creatinine, Ser: 0.8 mg/dL (ref 0.44–1.00)
GFR calc Af Amer: >60 mL/min (ref >60)
GFR calc non Af Amer: >60 mL/min (ref >60)
Glucose, Bld: 122 mg/dL — ABNORMAL HIGH (ref 65–99)
Potassium: 3.3 mmol/L — ABNORMAL LOW (ref 3.5–5.1)
Sodium: 137 mmol/L (ref 135–145)

## 2015-12-02 LAB — CBC
HCT: 33.6 % — ABNORMAL LOW (ref 36.0–46.0)
Hemoglobin: 10.6 g/dL — ABNORMAL LOW (ref 12.0–15.0)
MCH: 26.4 pg (ref 26.0–34.0)
MCHC: 31.5 g/dL (ref 30.0–36.0)
MCV: 83.6 fL (ref 78.0–100.0)
Platelets: 383 10*3/uL (ref 150–400)
RBC: 4.02 MIL/uL (ref 3.87–5.11)
RDW: 14 % (ref 11.5–15.5)
WBC: 14.8 10*3/uL — ABNORMAL HIGH (ref 4.0–10.5)

## 2015-12-02 LAB — MAGNESIUM: Magnesium: 1.5 mg/dL — ABNORMAL LOW (ref 1.7–2.4)

## 2015-12-02 SURGERY — INCISION AND DRAINAGE, ABSCESS
Anesthesia: General | Laterality: Right

## 2015-12-02 MED ORDER — LIDOCAINE HCL (CARDIAC) 20 MG/ML IV SOLN
INTRAVENOUS | Status: DC | PRN
  Administered 2015-12-02: 25 mg via INTRAVENOUS

## 2015-12-02 MED ORDER — LACTATED RINGERS IV SOLN
INTRAVENOUS | Status: DC | PRN
  Administered 2015-12-02: 14:00:00 via INTRAVENOUS

## 2015-12-02 MED ORDER — ONDANSETRON HCL 4 MG/2ML IJ SOLN
INTRAMUSCULAR | Status: DC | PRN
  Administered 2015-12-02: 4 mg via INTRAVENOUS

## 2015-12-02 MED ORDER — CHLORHEXIDINE GLUCONATE CLOTH 2 % EX PADS
6.0000 | MEDICATED_PAD | Freq: Every day | CUTANEOUS | Status: DC
  Administered 2015-12-02 – 2015-12-05 (×3): 6 via TOPICAL

## 2015-12-02 MED ORDER — MAGNESIUM SULFATE 50 % IJ SOLN: 2.0000 g | Freq: Once | INTRAMUSCULAR | Status: DC

## 2015-12-02 MED ORDER — PROMETHAZINE HCL 25 MG/ML IJ SOLN: 6.2500 mg | INTRAMUSCULAR | Status: DC | PRN

## 2015-12-02 MED ORDER — MAGNESIUM OXIDE 400 (241.3 MG) MG PO TABS: 400.0000 mg | ORAL_TABLET | Freq: Once | ORAL | Status: DC

## 2015-12-02 MED ORDER — MIDAZOLAM HCL 5 MG/5ML IJ SOLN
INTRAMUSCULAR | Status: DC | PRN
  Administered 2015-12-02: 2 mg via INTRAVENOUS

## 2015-12-02 MED ORDER — LABETALOL HCL 5 MG/ML IV SOLN
5.0000 mg | INTRAVENOUS | Status: DC | PRN
Start: 2015-12-02 — End: 2015-12-02
  Administered 2015-12-02 (×2): 5 mg via INTRAVENOUS

## 2015-12-02 MED ORDER — POTASSIUM CHLORIDE CRYS ER 20 MEQ PO TBCR
40.0000 meq | EXTENDED_RELEASE_TABLET | Freq: Two times a day (BID) | ORAL | Status: AC
  Administered 2015-12-02: 40 meq via ORAL
  Filled 2015-12-02: qty 2

## 2015-12-02 MED ORDER — FENTANYL CITRATE (PF) 100 MCG/2ML IJ SOLN
INTRAMUSCULAR | Status: AC
  Filled 2015-12-02: qty 2

## 2015-12-02 MED ORDER — MAGNESIUM SULFATE 2 GM/50ML IV SOLN
2.0000 g | Freq: Once | INTRAVENOUS | Status: AC
  Administered 2015-12-02: 2 g via INTRAVENOUS
  Filled 2015-12-02: qty 50

## 2015-12-02 MED ORDER — LABETALOL HCL 5 MG/ML IV SOLN
INTRAVENOUS | Status: AC
  Filled 2015-12-02: qty 4

## 2015-12-02 MED ORDER — PROPOFOL 10 MG/ML IV BOLUS
INTRAVENOUS | Status: DC | PRN
  Administered 2015-12-02: 200 mg via INTRAVENOUS

## 2015-12-02 MED ORDER — LACTATED RINGERS IV SOLN
INTRAVENOUS | Status: DC
  Administered 2015-12-02: 19:00:00 via INTRAVENOUS

## 2015-12-02 MED ORDER — FENTANYL CITRATE (PF) 100 MCG/2ML IJ SOLN
INTRAMUSCULAR | Status: DC | PRN
  Administered 2015-12-02: 75 ug via INTRAVENOUS
  Administered 2015-12-02: 50 ug via INTRAVENOUS
  Administered 2015-12-02: 25 ug via INTRAVENOUS
  Administered 2015-12-02: 50 ug via INTRAVENOUS

## 2015-12-02 MED ORDER — FENTANYL CITRATE (PF) 100 MCG/2ML IJ SOLN
25.0000 ug | INTRAMUSCULAR | Status: DC | PRN
  Administered 2015-12-02 (×3): 25 ug via INTRAVENOUS
  Administered 2015-12-02: 50 ug via INTRAVENOUS
  Administered 2015-12-02: 25 ug via INTRAVENOUS

## 2015-12-02 MED ORDER — MUPIROCIN 2 % EX OINT
1.0000 "application " | TOPICAL_OINTMENT | Freq: Two times a day (BID) | CUTANEOUS | Status: DC
  Administered 2015-12-02 – 2015-12-05 (×7): 1 via NASAL
  Filled 2015-12-02 (×3): qty 22

## 2015-12-02 MED ORDER — LACTATED RINGERS IV SOLN
INTRAVENOUS | Status: DC
  Administered 2015-12-02: 14:00:00 via INTRAVENOUS

## 2015-12-02 MED ORDER — 0.9 % SODIUM CHLORIDE (POUR BTL) OPTIME
TOPICAL | Status: DC | PRN
  Administered 2015-12-02: 1000 mL

## 2015-12-02 SURGICAL SUPPLY — 27 items
BNDG GAUZE ELAST 4 BULKY (GAUZE/BANDAGES/DRESSINGS) IMPLANT
CANISTER SUCTION 2500CC (MISCELLANEOUS) ×2 IMPLANT
COVER SURGICAL LIGHT HANDLE (MISCELLANEOUS) ×2 IMPLANT
DRAPE LAPAROSCOPIC ABDOMINAL (DRAPES) ×2 IMPLANT
DRAPE UTILITY XL STRL (DRAPES) ×4 IMPLANT
DRSG PAD ABDOMINAL 8X10 ST (GAUZE/BANDAGES/DRESSINGS) ×2 IMPLANT
ELECT CAUTERY BLADE 6.4 (BLADE) ×2 IMPLANT
ELECT REM PT RETURN 9FT ADLT (ELECTROSURGICAL) ×2
ELECTRODE REM PT RTRN 9FT ADLT (ELECTROSURGICAL) ×1 IMPLANT
GAUZE IODOFORM PACK 1/2 7832 (GAUZE/BANDAGES/DRESSINGS) ×4 IMPLANT
GAUZE SPONGE 4X4 12PLY STRL (GAUZE/BANDAGES/DRESSINGS) ×2 IMPLANT
GLOVE BIOGEL PI IND STRL 8 (GLOVE) ×1 IMPLANT
GLOVE BIOGEL PI INDICATOR 8 (GLOVE) ×1
GLOVE ECLIPSE 7.5 STRL STRAW (GLOVE) ×2 IMPLANT
GLOVE SURG SS PI 6.5 STRL IVOR (GLOVE) ×2 IMPLANT
GOWN STRL REUS W/ TWL LRG LVL3 (GOWN DISPOSABLE) ×2 IMPLANT
GOWN STRL REUS W/TWL LRG LVL3 (GOWN DISPOSABLE) ×2
KIT BASIN OR (CUSTOM PROCEDURE TRAY) ×2 IMPLANT
KIT ROOM TURNOVER OR (KITS) ×2 IMPLANT
NS IRRIG 1000ML POUR BTL (IV SOLUTION) ×2 IMPLANT
PACK GENERAL/GYN (CUSTOM PROCEDURE TRAY) ×2 IMPLANT
PAD ARMBOARD 7.5X6 YLW CONV (MISCELLANEOUS) ×2 IMPLANT
SWAB COLLECTION DEVICE MRSA (MISCELLANEOUS) IMPLANT
TAPE CLOTH SURG 4X10 WHT LF (GAUZE/BANDAGES/DRESSINGS) ×2 IMPLANT
TOWEL OR 17X24 6PK STRL BLUE (TOWEL DISPOSABLE) ×2 IMPLANT
TOWEL OR 17X26 10 PK STRL BLUE (TOWEL DISPOSABLE) ×2 IMPLANT
TUBE ANAEROBIC SPECIMEN COL (MISCELLANEOUS) IMPLANT

## 2015-12-02 NOTE — Anesthesia Postprocedure Evaluation (Signed)
Anesthesia Post Note  Patient: Veronica [name]  Procedure(s) Performed: Procedure(s) (LRB): INCISION AND DRAINAGE ABSCESS (Right)  Patient location during evaluation: PACU Anesthesia Type: General Level of consciousness: awake and alert Pain management: pain level controlled Vital Signs Assessment: post-procedure vital signs reviewed and stable Respiratory status: spontaneous breathing, nonlabored ventilation, respiratory function stable and patient connected to nasal cannula oxygen Cardiovascular status: blood pressure returned to baseline and stable Postop Assessment: no signs of nausea or vomiting Anesthetic complications: no    Last Vitals:  Vitals:   12/02/15 1718 12/02/15 1728  BP: (!) 169/82 (!) 177/80  Pulse: 78 75  Resp: (!) 21 (!) 24  Temp:      Last Pain:  Vitals:   12/02/15 1645  TempSrc:   PainSc: 9                  Yoskar Murrillo J

## 2015-12-02 NOTE — Op Note (Signed)
OPERATIVE REPORT  DATE OF OPERATION:  12/02/2015  PATIENT:  Veronica [name]  55 y.o. female  PRE-OPERATIVE DIAGNOSIS:  Right axillary abscess  POST-OPERATIVE DIAGNOSIS:  Right axillary abscess  FINDINGS:  Deep abscess with spreading soft tissue infection  PROCEDURE:  Procedure(s): INCISION AND DRAINAGE ABSCESS  SURGEON:  Surgeon(s): Jimmye Norman, MD  ASSISTANT: None  ANESTHESIA:   general  COMPLICATIONS:  None  EBL: 30 ml  BLOOD ADMINISTERED: none  DRAINS: Wound packed with 2 bottles of 1/2 inch iodoform nugauze   SPECIMEN:  Source of Specimen:  Wound  COUNTS CORRECT:  YES  PROCEDURE DETAILS: The patient was taken to the operating room and placed on the table in supine position. After an adequate general laryngeal airway anesthetic was administered she was prepped and draped in usual sterile manner exposing her right axilla.  A proper timeout was performed identifying the patient and procedure to be performed. The wound was actively extruding pus and we opened incision towards the radial side of the arm and using the surgeon's finger broke up multiple loculations into the edematous soft tissue along the release of pus. Aerobic and anaerobic cultures were sent. Once the wound was open we irrigated with saline solution then packed it with 2 bottles of half inch iodoform Nu Gauze. All counts were correct.  PATIENT DISPOSITION:  Hemodynamically stable to the PACU   Veronica [name] 8/4/20173:32 PM

## 2015-12-02 NOTE — Brief Op Note (Signed)
12/02/2015  3:30 PM  PATIENT:  Veronica [name]  55 y.o. female  PRE-OPERATIVE DIAGNOSIS:  Right axillary abscess  POST-OPERATIVE DIAGNOSIS:  Right axillary abscess  PROCEDURE:  Procedure(s) with comments: INCISION AND DRAINAGE ABSCESS (Right) - Right axillary abscess  SURGEON:  Surgeon(s) and Role:    * Jimmye Norman, MD - Primary  PHYSICIAN ASSISTANT:   ASSISTANTS: none   ANESTHESIA:   general and with LMA  EBL:  Total I/O In: 600 [I.V.:600] Out: 750 [Urine:650; Blood:100]  BLOOD ADMINISTERED:none  DRAINS: 2 bottles of 1/2 iodoform nugauze in the wound.   LOCAL MEDICATIONS USED:  NONE  SPECIMEN:  Source of Specimen:  aerobic and anaerobic cultures  DISPOSITION OF SPECIMEN:  Microbiology  COUNTS:  YES  TOURNIQUET:  * No tourniquets in log *  DICTATION: .Dragon Dictation  PLAN OF CARE: Return to inpatient bed on 6N  PATIENT DISPOSITION:  PACU - hemodynamically stable.   Delay start of Pharmacological VTE agent (>24hrs) due to surgical blood loss or risk of bleeding: no

## 2015-12-02 NOTE — Progress Notes (Signed)
Family Medicine Teaching Service Daily Progress Note Intern Pager: [phone]  Patient name: Veronica [name] Medical record number: [id number] Date of birth: April 02, 1961 Age: 55 y.o. Gender: female  Primary Care Provider: Beaulah Dinning, MD Consultants: Surgery Code Status: FULL  Pt Overview and Major Events to Date:  Vancomycin start 8/2  Assessment and Plan:  55 y/o F with PMH sig for HTN, HLD, SLE presenting with R arm cellulitis.  #Right arm cellulitis Large area of erythema, induration and warmth involving entire upper arm consistent with cellulitis, but firm indurated area concerning for abscess. Recent I&D with persistent pain, swelling now with elevated temp concerning for worsening infection -received one dose of doxycycline 100mg  yesterday am -Percocet 5-325 mg po Q4 prn for pain -IV Vancomycin (Day 3) per pharm -Bcx ordered x2 in process: Neg x24h -Nasal MRSA PCR positive -Soft tissue U/S: shows 1.2 cm complex subcutaneous fluid collection compatible with abscess loc 5mm below skin surface at visible furuncle.  -Consulted Surgery for I&D yesterday, OR for I&D today 8/4 Patient tolerated procedure well with no complications. Aerobic and anaerobic cx sent.  -BMP Na+ 137, K+ 3.3, CBC wbc 14.8   #Nausea- stable, no current nausea able to tolerate PO - Zofran prn  #Left axilla boil/hydradenitis suppurativa -has another boil under left arm, about 1/4 inch open wound with some serous drainage. Tender to touch but not warm.  #Hypokalemia -K+ 3.3 -Kdur po bid started  -replete as needed and monitor BMPs -Follow up Magnesium and K level in am:   #Hypertension on admission 148/85, Stable -continue home Amlodipine 5mg  daily -continue home HCTZ 25mg  daily -continue home losartan 100 mg -continue home Carvedilol 6.25mg  po bid  #Hyperlipidemia -continue home Atorvastatin 10mg  po daily  # H/O migraine- no current headache -home Excedrin Q6 prn -home  Ibuprofen 800mg  Q6 prn - on duloxetine 30 qD- started for concern that her chronic headaches were in part contributed to by her fibromyalgia on 02/03/2015  #FEN/GI: Diet Heart, IV NS x12 hr #Prophylaxis: Lovenox    Disposition:  Admitted to inpatient service for IV antibiotics and treatment of her cellulitis, attending Dr Pollie Meyer. Surgery for I&D today.  Subjective:  Patient states the swelling in her right upper arm has reduced, pain controlled. Hurts to move the arm. No other complaints at current time. VSS and afebrile overnight. Infection has not spread past previously marked borders.   Objective: Temp:  [98.6 F (37 C)-99.1 F (37.3 C)] 99.1 F (37.3 C) (08/04 1545) Pulse Rate:  [73-95] 83 (08/04 1648) Resp:  [16-26] 26 (08/04 1648) BP: (140-180)/(61-102) 180/83 (08/04 1648) SpO2:  [96 %-100 %] 100 % (08/04 1648)   Physical Exam: General: Well developed, well nourished, in moderate pain but cooperative, NAD Eyes: PERRL, EOMI ENTM: Moist mucous membranes, no lesions, pharynx without erythema, edema or exudate Neck: supple, no masses, no LAD Cardiovascular: Heart sounds are normal. Regular rate and rhythm without murmur, gallop or rub. Respiratory: CTA B/L no rales rhonchi or wheezing Abdomen: Soft, non-tender, +BS; no bruits, organomegaly or masses. MSK: right arm with limited ROM, tender, muscular strength intact  Skin: Right upper arm and axilla warm, erythematous with induration, tender to palpation, acutely painful with arm movement area marked off with black pen to monitor spread, no further spread noted in past 24h Neuro: Alert and oriented, normal. strength and sensation  Laboratory:  Recent Labs Lab 11/30/15 1920 12/01/15 0226 12/02/15 0410  WBC 19.6* 18.9* 14.8*  HGB 12.9 12.0 10.6*  HCT  39.4 37.1 33.6*  PLT 349 360 383    Recent Labs Lab 11/30/15 1920 12/01/15 0226 12/02/15 0410  NA 135 134* 137  K 2.9* 3.0* 3.3*  CL 102 103 104  CO2 22 22 25    BUN 7 7 6   CREATININE 0.87 0.83 0.80  CALCIUM 9.0 8.7* 8.7*  GLUCOSE 180* 144* 122*    Imaging/Diagnostic Tests:  No results found.   Freddrick March, MD 12/02/2015, 4:50 PM PGY-1, [facility] Family Medicine FPTS Intern pager: [phone], text pages welcome

## 2015-12-02 NOTE — Anesthesia Preprocedure Evaluation (Addendum)
Anesthesia Evaluation  Patient identified by MRN, date of birth, ID band Patient awake    Reviewed: Allergy & Precautions, NPO status , Patient's Chart, lab work & pertinent test results  Airway Mallampati: II  TM Distance: >3 FB Neck ROM: Full    Dental no notable dental hx.    Pulmonary pneumonia, resolved,    Pulmonary exam normal breath sounds clear to auscultation       Cardiovascular hypertension, Normal cardiovascular exam Rhythm:Regular Rate:Normal     Neuro/Psych  Headaches,  Neuromuscular disease negative psych ROS   GI/Hepatic negative GI ROS, Neg liver ROS,   Endo/Other  Morbid obesity  Renal/GU negative Renal ROS  negative genitourinary   Musculoskeletal  (+) Arthritis , Fibromyalgia -  Abdominal (+) + obese,   Peds negative pediatric ROS (+)  Hematology negative hematology ROS (+)   Anesthesia Other Findings   Reproductive/Obstetrics negative OB ROS                            Anesthesia Physical Anesthesia Plan  ASA: III  Anesthesia Plan: General   Post-op Pain Management:    Induction: Intravenous  Airway Management Planned: LMA  Additional Equipment:   Intra-op Plan:   Post-operative Plan: Extubation in OR  Informed Consent: I have reviewed the patients History and Physical, chart, labs and discussed the procedure including the risks, benefits and alternatives for the proposed anesthesia with the patient or authorized representative who has indicated his/her understanding and acceptance.   Dental advisory given  Plan Discussed with: CRNA  Anesthesia Plan Comments:         Anesthesia Quick Evaluation

## 2015-12-02 NOTE — Anesthesia Procedure Notes (Signed)
Procedure Name: LMA Insertion Date/Time: 12/02/2015 2:47 PM Performed by: Carmela Rima Pre-anesthesia Checklist: Timeout performed, Patient being monitored, Suction available, Emergency Drugs available and Patient identified Patient Re-evaluated:Patient Re-evaluated prior to inductionOxygen Delivery Method: Circle system utilized Preoxygenation: Pre-oxygenation with 100% oxygen Intubation Type: IV induction Ventilation: Mask ventilation without difficulty LMA: LMA inserted LMA Size: 4.0 Number of attempts: 1 Placement Confirmation: breath sounds checked- equal and bilateral,  ETT inserted through vocal cords under direct vision and positive ETCO2 Tube secured with: Tape Dental Injury: Teeth and Oropharynx as per pre-operative assessment

## 2015-12-02 NOTE — Transfer of Care (Signed)
Immediate Anesthesia Transfer of Care Note  Patient: Veronica [name]  Procedure(s) Performed: Procedure(s) with comments: INCISION AND DRAINAGE ABSCESS (Right) - Right axillary abscess  Patient Location: PACU  Anesthesia Type:General  Level of Consciousness: sedated  Airway & Oxygen Therapy: Patient Spontanous Breathing and Patient connected to nasal cannula oxygen  Post-op Assessment: Report given to RN, Post -op Vital signs reviewed and stable and Patient moving all extremities X 4  Post vital signs: Reviewed and stable  Last Vitals:  Vitals:   12/01/15 2000 12/02/15 0445  BP: 140/61 (!) 149/69  Pulse: 95 73  Resp: 19 19  Temp: 37 C 37.1 C    Last Pain:  Vitals:   12/02/15 0853  TempSrc:   PainSc: 2       Patients Stated Pain Goal: 2 (12/01/15 2331)  Complications: No apparent anesthesia complications

## 2015-12-02 NOTE — Progress Notes (Signed)
Patient for I&D of right axillary abscess today.  Veronica [name]. Gae Bon, MD, FACS [phone] [facility]

## 2015-12-02 NOTE — Progress Notes (Signed)
Pharmacy Antibiotic Note  Veronica [name] is a 55 y.o. female admitted on 11/30/2015 with subacute right axilla/arm cellulitis. Planning for formal I&D of abscess in OR today. Currently on day 2 of vancomycin.   Plan: 1. Continue current dose of vancomycin 2. Will f/u post op and plan to obtain level soon if vancomycin continued     Height: 5\' 4"  (162.6 cm) Weight: 238 lb (108 kg) IBW/kg (Calculated) : 54.7  Temp (24hrs), Avg:98.7 F (37.1 C), Min:98.6 F (37 C), Max:98.8 F (37.1 C)   Recent Labs Lab 11/30/15 1920 12/01/15 0226 12/02/15 0410  WBC 19.6* 18.9* 14.8*  CREATININE 0.87 0.83 0.80    Estimated Creatinine Clearance: 98.7 mL/min (by C-G formula based on SCr of 0.8 mg/dL).    Allergies  Allergen Reactions  . Tessalon [Benzonatate] Other (See Comments)    Lip and eye swelling    Antimicrobials this admission: Vancomycin 8/2 >>   Dose adjustments this admission: None at this time  Microbiology results: 8/2 BCx: ngtd  Thank you for allowing pharmacy to be a part of this patient's care.  02/01/16, PharmD, BCPS 12/02/2015, 11:37 AM Pager: [phone]

## 2015-12-03 DIAGNOSIS — L02429 Furuncle of limb, unspecified: Secondary | ICD-10-CM

## 2015-12-03 DIAGNOSIS — I10 Essential (primary) hypertension: Secondary | ICD-10-CM

## 2015-12-03 DIAGNOSIS — L02439 Carbuncle of limb, unspecified: Secondary | ICD-10-CM

## 2015-12-03 LAB — BASIC METABOLIC PANEL
Anion gap: 6 (ref 5–15)
BUN: 6 mg/dL (ref 6–20)
CO2: 27 mmol/L (ref 22–32)
Calcium: 8.6 mg/dL — ABNORMAL LOW (ref 8.9–10.3)
Chloride: 101 mmol/L (ref 101–111)
Creatinine, Ser: 0.74 mg/dL (ref 0.44–1.00)
GFR calc Af Amer: >60 mL/min (ref >60)
GFR calc non Af Amer: >60 mL/min (ref >60)
Glucose, Bld: 150 mg/dL — ABNORMAL HIGH (ref 65–99)
Potassium: 3.4 mmol/L — ABNORMAL LOW (ref 3.5–5.1)
Sodium: 134 mmol/L — ABNORMAL LOW (ref 135–145)

## 2015-12-03 LAB — CBC
HCT: 32.9 % — ABNORMAL LOW (ref 36.0–46.0)
Hemoglobin: 10.2 g/dL — ABNORMAL LOW (ref 12.0–15.0)
MCH: 26 pg (ref 26.0–34.0)
MCHC: 31 g/dL (ref 30.0–36.0)
MCV: 83.9 fL (ref 78.0–100.0)
Platelets: 393 10*3/uL (ref 150–400)
RBC: 3.92 MIL/uL (ref 3.87–5.11)
RDW: 14 % (ref 11.5–15.5)
WBC: 11.8 10*3/uL — ABNORMAL HIGH (ref 4.0–10.5)

## 2015-12-03 LAB — MAGNESIUM: Magnesium: 1.8 mg/dL (ref 1.7–2.4)

## 2015-12-03 MED ORDER — MORPHINE SULFATE (PF) 2 MG/ML IV SOLN
2.0000 mg | INTRAVENOUS | Status: DC | PRN
  Administered 2015-12-04: 4 mg via INTRAVENOUS
  Filled 2015-12-03: qty 1
  Filled 2015-12-03: qty 2

## 2015-12-03 MED ORDER — OXYCODONE-ACETAMINOPHEN 5-325 MG PO TABS
1.0000 | ORAL_TABLET | ORAL | Status: DC | PRN
  Administered 2015-12-03 – 2015-12-05 (×3): 2 via ORAL
  Filled 2015-12-03 (×3): qty 2

## 2015-12-03 MED ORDER — POTASSIUM CHLORIDE CRYS ER 20 MEQ PO TBCR
40.0000 meq | EXTENDED_RELEASE_TABLET | Freq: Two times a day (BID) | ORAL | Status: DC
  Administered 2015-12-03 (×2): 40 meq via ORAL
  Filled 2015-12-03 (×2): qty 2

## 2015-12-03 NOTE — Progress Notes (Signed)
Family Medicine Teaching Service Daily Progress Note Intern Pager: [phone]  Patient name: Veronica [name] Medical record number: [id number] Date of birth: 09-Oct-1960 Age: 55 y.o. Gender: female  Primary Care Provider: Beaulah Dinning, MD Consultants: Surgery Code Status: FULL  Pt Overview and Major Events to Date:  Vancomycin start 8/2 Surgical I&D 8/4   Assessment and Plan:  55 y/o F with PMH sig for HTN, HLD, SLE presenting with R arm cellulitis.  #Right arm cellulitis On admission, large area of erythema, induration and warmth involving entire upper arm consistent with cellulitis, but firm indurated area concerning for abscess. Recent I&D with persistent pain, swelling now with elevated temp concerning for worsening infection. Soft tissue U/S showed 1.2 cm complex subcutaneous fluid collection compatible with abscess loc 5mm below skin surface at visible furuncle. S/p repeat I&D 8/4. Afebrile overnight. Erythema limited to a few cm around site of I&D.  -received one dose of doxycycline 100mg  8/3 -Percocet 5-325 mg po Q4 prn for pain -IV Vancomycin (Day 4) per pharm -Bcx ordered x2 in process: Neg x48h -Nasal MRSA PCR positive - Aerobic and anaerobic cx sent after I&D --> await results to switch to PO abx - Surgery consulted and following, appreciate recommendations - Leukocytosis improving  #Nausea- stable, no current nausea able to tolerate PO - Zofran prn  #Left axilla boil/hydradenitis suppurativa -has another boil under left arm, about 1/4 inch open wound with some serous drainage. Tender to touch but not warm. -Apply warm compresses q2h to encourage drainage  #Hypokalemia -K+ 3.4 8/5 -Kdur po bid  -replete as needed and monitor BMPs -Mg 1.8   #Hypertension on admission 148/85, elevated at 150-180/70-83 overnight --perhaps due to pain -continue home Amlodipine 5mg  daily -continue home HCTZ 25mg  daily -continue home losartan 100 mg -continue home  Carvedilol 6.25mg  po bid -Reminded patient she has prn pain medication  #Hyperlipidemia -continue home Atorvastatin 10mg  po daily  # H/O migraine- no current headache -home Excedrin Q6 prn -home Ibuprofen 800mg  Q6 prn - on duloxetine 30 qD- started for concern that her chronic headaches were in part contributed to by her fibromyalgia on 02/03/2015  #FEN/GI: Diet Heart #Prophylaxis: Lovenox   Disposition:  Admitted to inpatient service for IV antibiotics and treatment of her cellulitis  Subjective:  Patient states pain improved after taking a percocet. She denies nausea or trouble with appetite. She had not noticed that the boil under her left arm was draining.   Objective: Temp:  [98 F (36.7 C)-99.1 F (37.3 C)] 98.6 F (37 C) (08/05 0627) Pulse Rate:  [75-83] 77 (08/05 0627) Resp:  [16-26] 19 (08/05 0627) BP: (150-180)/(66-102) 170/77 (08/05 0627) SpO2:  [97 %-100 %] 100 % (08/05 4034)   Physical Exam: General: Well developed, well nourished, in some discomfort but cooperative and pleasant ENTM: Moist mucous membranes, normal oropharynx Cardiovascular: Heart sounds are normal. Regular rate and rhythm without murmur, gallop or rub. Respiratory: CTA B/L no rales rhonchi or wheezing Abdomen: Soft, non-tender, +BS; no bruits, organomegaly or masses. Skin: right arm with tender area of induration surrounding 4x4cm area of packing with a few cm of erythema surrounding open area; open area (~1x2cm) of with some granulation tissue and induration and serous drainage under L axilla Neuro: Alert and oriented, normal. strength and sensation  Laboratory:  Recent Labs Lab 12/01/15 0226 12/02/15 0410 12/03/15 0430  WBC 18.9* 14.8* 11.8*  HGB 12.0 10.6* 10.2*  HCT 37.1 33.6* 32.9*  PLT 360 383 393  Recent Labs Lab 12/01/15 0226 12/02/15 0410 12/03/15 0430  NA 134* 137 134*  K 3.0* 3.3* 3.4*  CL 103 104 101  CO2 22 25 27   BUN 7 6 6   CREATININE 0.83 0.80 0.74   CALCIUM 8.7* 8.7* 8.6*  GLUCOSE 144* 122* 150*    Imaging/Diagnostic Tests:  No results found.   Casey Burkitt, MD 12/03/2015, 9:06 AM PGY-2, [facility] FPTS Intern pager: [phone], text pages welcome

## 2015-12-03 NOTE — Progress Notes (Signed)
1 Day Post-Op  Subjective: Doing well ahead to reinforce the outside dressing. I took this down partially she still has some erythema around the open site. Otherwise she seems to be doing fairly well.  Objective: Vital signs in last 24 hours: Temp:  [98 F (36.7 C)-99.1 F (37.3 C)] 98.6 F (37 C) (08/05 0627) Pulse Rate:  [75-83] 77 (08/05 0627) Resp:  [16-26] 19 (08/05 0627) BP: (150-180)/(66-102) 170/77 (08/05 0627) SpO2:  [97 %-100 %] 100 % (08/05 0627) Last BM Date: 12/02/15 Afebrile vital signs are stable Potassium 3.4 glucose 150, magnesium 1.8 WBC down to 11.8.   Intake/Output from previous day: 08/04 0701 - 08/05 0700 In: 1205.8 [P.O.:300; I.V.:905.8] Out: 1300 [Urine:1200; Blood:100] Intake/Output this shift: No intake/output data recorded.  General appearance: alert, cooperative and no distress Skin: Some ongoing erythema around the open site. Packing is intact. Plan to take this all down tomorrow.  Lab Results:   Recent Labs  12/02/15 0410 12/03/15 0430  WBC 14.8* 11.8*  HGB 10.6* 10.2*  HCT 33.6* 32.9*  PLT 383 393    BMET  Recent Labs  12/02/15 0410 12/03/15 0430  NA 137 134*  K 3.3* 3.4*  CL 104 101  CO2 25 27  GLUCOSE 122* 150*  BUN 6 6  CREATININE 0.80 0.74  CALCIUM 8.7* 8.6*   PT/INR No results for input(s): LABPROT, INR in the last 72 hours.  No results for input(s): AST, ALT, ALKPHOS, BILITOT, PROT, ALBUMIN in the last 168 hours.   Lipase     Component Value Date/Time   LIPASE 40 07/28/2012 1528     Studies/Results: No results found. Prior to Admission medications   Medication Sig Start Date End Date Taking? Authorizing Provider  amLODipine (NORVASC) 5 MG tablet Take 1 tablet (5 mg total) by mouth every evening. 11/28/15  Yes [place] N Rumley, DO  aspirin-acetaminophen-caffeine (EXCEDRIN MIGRAINE) [phone] MG tablet Take 1 tablet by mouth every 6 (six) hours as needed for headache. Reported on 04/19/2015   Yes Historical  Provider, MD  atorvastatin (LIPITOR) 10 MG tablet TAKE ONE TABLET BY MOUTH ONCE DAILY 11/15/15  Yes Beaulah Dinning, MD  carvedilol (COREG) 6.25 MG tablet Take 1 tablet (6.25 mg total) by mouth 2 (two) times daily with a meal. 09/08/15  Yes Smitty Cords, DO  cetirizine (ZYRTEC) 10 MG tablet Take 10 mg by mouth as needed for allergies.   Yes Historical Provider, MD  clindamycin (CLINDAGEL) 1 % gel Apply topically 2 (two) times daily as needed. Please use for boils as needed. 11/28/15  Yes Loraine N Rumley, DO  DULoxetine (CYMBALTA) 30 MG capsule Take 1 capsule (30 mg total) by mouth daily. 06/14/15  Yes Myra Rude, MD  fluticasone (FLONASE) 50 MCG/ACT nasal spray Place 2 sprays into both nostrils daily. Patient taking differently: Place 2 sprays into both nostrils daily as needed for allergies.  03/04/15  Yes Mercedes Camprubi-Soms, PA-C  hydrochlorothiazide (HYDRODIURIL) 25 MG tablet TAKE ONE TABLET BY MOUTH DAILY. 10/12/15  Yes Myra Rude, MD  ibuprofen (ADVIL,MOTRIN) 200 MG tablet Take 800 mg by mouth every 6 (six) hours as needed for headache. Reported on 04/19/2015   Yes Historical Provider, MD  losartan (COZAAR) 100 MG tablet TAKE ONE TABLET BY MOUTH ONCE DAILY 09/09/15  Yes Smitty Cords, DO  naproxen (EC NAPROSYN) 500 MG EC tablet Take 1 tablet (500 mg total) by mouth 2 (two) times daily with a meal. Patient taking differently: Take 500 mg by  mouth 2 (two) times daily as needed.  09/22/15  Yes Myra Rude, MD  potassium chloride (K-DUR) 10 MEQ tablet Take 1 tablet (10 mEq total) by mouth daily. 11/28/15  Yes Rey[place] N Rumley, DO  doxycycline (VIBRA-TABS) 100 MG tablet Take 1 tablet (100 mg total) by mouth 2 (two) times daily. Patient not taking: Reported on 11/30/2015 11/28/15   Araceli Bouche, DO    Medications: . amLODipine  5 mg Oral QPM  . atorvastatin  10 mg Oral Daily  . carvedilol  6.25 mg Oral BID WC  . Chlorhexidine Gluconate Cloth  6 each Topical  Q0600  . DULoxetine  30 mg Oral Daily  . enoxaparin (LOVENOX) injection  0.5 mg/kg Subcutaneous Q24H  . hydrochlorothiazide  25 mg Oral Daily  . lidocaine-EPINEPHrine  10 mL Intradermal Once  . losartan  100 mg Oral Daily  . mupirocin ointment  1 application Nasal BID  . potassium chloride  40 mEq Oral BID  . vancomycin  1,000 mg Intravenous Q12H   . lactated ringers 50 mL/hr at 12/02/15 1853  . lactated ringers 10 mL/hr at 12/02/15 1345    Hypertension SLE Rheumatoid arthritis Fibromyalgia  Assessment/Plan RightAxillary abscess Status post right axillary abscess incision and drainage, 12/02/15, Dr. Jimmye Norman. (Findings: Deep abscess with spreading soft tissue infection) FEN:  Cardiac ID: Day 4 of vancomycin DVT:  Lovenox/SCD  Plan: Going to change the external dressing, continue antibiotics, we will take down the entire dressing with removal of packing tomorrow.      LOS: 3 days    Consuela Widener 12/03/2015 [phone]

## 2015-12-04 LAB — CBC
HCT: 32.6 % — ABNORMAL LOW (ref 36.0–46.0)
Hemoglobin: 10.3 g/dL — ABNORMAL LOW (ref 12.0–15.0)
MCH: 26.5 pg (ref 26.0–34.0)
MCHC: 31.6 g/dL (ref 30.0–36.0)
MCV: 83.8 fL (ref 78.0–100.0)
Platelets: 391 10*3/uL (ref 150–400)
RBC: 3.89 MIL/uL (ref 3.87–5.11)
RDW: 14 % (ref 11.5–15.5)
WBC: 8.4 10*3/uL (ref 4.0–10.5)

## 2015-12-04 LAB — BASIC METABOLIC PANEL
Anion gap: 7 (ref 5–15)
BUN: 7 mg/dL (ref 6–20)
CO2: 28 mmol/L (ref 22–32)
Calcium: 8.8 mg/dL — ABNORMAL LOW (ref 8.9–10.3)
Chloride: 102 mmol/L (ref 101–111)
Creatinine, Ser: 0.77 mg/dL (ref 0.44–1.00)
GFR calc Af Amer: >60 mL/min (ref >60)
GFR calc non Af Amer: >60 mL/min (ref >60)
Glucose, Bld: 104 mg/dL — ABNORMAL HIGH (ref 65–99)
Potassium: 3.9 mmol/L (ref 3.5–5.1)
Sodium: 137 mmol/L (ref 135–145)

## 2015-12-04 NOTE — Discharge Summary (Signed)
Family Medicine Teaching [facility] Discharge Summary  Patient name: Veronica [name] Medical record number: [id number] Date of birth: Jul 11, 1960 Age: 55 y.o. Gender: female Date of Admission: 11/30/2015  Date of Discharge: 12/05/2015 Admitting Physician: Uvaldo Rising, MD  Primary Care Provider: Beaulah Dinning, MD Consultants: Surgery  Indication for Hospitalization: Cellulitis  Discharge Diagnoses/Problem List:  Cellulitis of R Upper extremity, HTN  Disposition: Home  Discharge Condition: Stable, improved  Discharge Exam:  Gen- well-nourished, alert and oriented in no apparent distress Skin - normal coloration and turgor, no rashes, erythema of right upper extremity improved, cap refill <2 sec Eyes - pupils equal and reactive, extraocular eye movements intact, no conjunctival injection Ears - bilateral TM's and external ear canals normal Nose - normal and patent, no erythema, discharge or rhinnorhea Mouth - mucous membranes moist, pharynx normal without lesions Neck - supple, no significant adenopathy Chest - clear to auscultation bilaterally, no wheezes, rales or rhonchi, symmetric air entry Heart - normal rate, regular rhythm, normal S1, S2, no murmurs, rubs, clicks or gallops Abdomen - soft, nontender, nondistended, no masses or organomegaly Musculoskeletal - no joint tenderness, deformity or swelling, normal strength, full range of motion without pain Neuro - face symmetric, patellar reflexes equal bilaterally Psych: mood appropriate  Brief Hospital Course:   Veronica [name] is a 55 yo F who presented with worsening pain and swelling of her right upper arm.  Right arm cellulitis Soft tissue ultrasound showed a 1.2 cm complex subcutaneous fluid collection compatible with abscess loc 5mm below skin surface at visible furuncle. She was started on IV Vancomycin and general surgery was consulted to consider I&D. Blood cultures negative, nasal MRSA PCR positive. She  underwent an I&D on 8/4 and tolerated the procedure well with no complications. Leukocytosis improved on discharge from 19.6 to 8.4. She has been afebrile since admission and at time of discharge her erythema now limited to a few cm around site of I&D. Culture and sensitivity showed MRSA sensitive to Clindamycin. She was discharged on 4 days of oral Clindamycin to complete a total of 10 days of antibiotics.  Hypokalemia Upon admission potassium of 2.9. Potassium was repleted as needed during admission and hypokalemia resolved upon discharge, 3.9.   Issues for Follow Up:  Follow up with surgery in 1-2 weeks to re-check wound.  PCP appointment scheduled for 8/11. Discharged on 4 days of po Clindamycin.   Significant Procedures: s/p I&D on 8/4  Significant Labs and Imaging:   Recent Labs Lab 12/02/15 0410 12/03/15 0430 12/04/15 0511  WBC 14.8* 11.8* 8.4  HGB 10.6* 10.2* 10.3*  HCT 33.6* 32.9* 32.6*  PLT 383 393 391    Recent Labs Lab 11/30/15 1920 12/01/15 0226 12/02/15 0410 12/03/15 0430 12/04/15 0511  NA 135 134* 137 134* 137  K 2.9* 3.0* 3.3* 3.4* 3.9  CL 102 103 104 101 102  CO2 22 22 25 27 28   GLUCOSE 180* 144* 122* 150* 104*  BUN 7 7 6 6 7   CREATININE 0.87 0.83 0.80 0.74 0.77  CALCIUM 9.0 8.7* 8.7* 8.6* 8.8*  MG  --   --  1.5* 1.8  --     Results/Tests Pending at Time of Discharge: None.  Discharge Medications:    Medication List    STOP taking these medications   doxycycline 100 MG tablet Commonly known as:  VIBRA-TABS     TAKE these medications   amLODipine 5 MG tablet Commonly known as:  NORVASC Take 1 tablet (5 mg  total) by mouth every evening.   aspirin-acetaminophen-caffeine 250-250-65 MG tablet Commonly known as:  EXCEDRIN MIGRAINE Take 1 tablet by mouth every 6 (six) hours as needed for headache. Reported on 04/19/2015   atorvastatin 10 MG tablet Commonly known as:  LIPITOR TAKE ONE TABLET BY MOUTH ONCE DAILY   carvedilol 6.25 MG  tablet Commonly known as:  COREG Take 1 tablet (6.25 mg total) by mouth 2 (two) times daily with a meal.   cetirizine 10 MG tablet Commonly known as:  ZYRTEC Take 10 mg by mouth as needed for allergies.   clindamycin 1 % gel Commonly known as:  CLINDAGEL Apply topically 2 (two) times daily as needed. Please use for boils as needed.   clindamycin 300 MG capsule Commonly known as:  CLEOCIN Take 1 capsule (300 mg total) by mouth 4 (four) times daily.   DULoxetine 30 MG capsule Commonly known as:  CYMBALTA Take 1 capsule (30 mg total) by mouth daily.   fluticasone 50 MCG/ACT nasal spray Commonly known as:  FLONASE Place 2 sprays into both nostrils daily. What changed:  when to take this  reasons to take this   hydrochlorothiazide 25 MG tablet Commonly known as:  HYDRODIURIL TAKE ONE TABLET BY MOUTH DAILY.   ibuprofen 200 MG tablet Commonly known as:  ADVIL,MOTRIN Take 800 mg by mouth every 6 (six) hours as needed for headache. Reported on 04/19/2015   losartan 100 MG tablet Commonly known as:  COZAAR TAKE ONE TABLET BY MOUTH ONCE DAILY   naproxen 500 MG EC tablet Commonly known as:  EC NAPROSYN Take 1 tablet (500 mg total) by mouth 2 (two) times daily with a meal. What changed:  when to take this  reasons to take this   potassium chloride 10 MEQ tablet Commonly known as:  K-DUR Take 1 tablet (10 mEq total) by mouth daily.       Discharge Instructions: Please refer to Patient Instructions section of EMR for full details.  Patient was counseled important signs and symptoms that should prompt return to medical care, changes in medications, dietary instructions, activity restrictions, and follow up appointments.   Follow-Up Appointments: Follow-up Information    WALDEN,JEFF, MD Follow up on 12/09/2015.   Specialty:  Family Medicine Why:  hosptial follow-up to check wound healing Contact information: [address] [place]  [address] [phone]           Freddrick March, MD 12/05/2015, 11:51 AM PGY-2, [facility]

## 2015-12-04 NOTE — Discharge Instructions (Signed)
Veronica [name],  Please continue to change your dressing with packing daily. Continue antibiotics by mouth as prescribed. We would like to see how you are healing with a follow-up appointment at the end of this week. If you cannot make this appointment, please call the Family Medicine clinic. Signs of worsening infection would be spread of redness, fevers, worsening pain and inability to tolerate food. Please seek medical care sooner if you notice these symptoms.

## 2015-12-04 NOTE — Progress Notes (Signed)
2 Days Post-Op  Subjective: No complaints  Objective: Vital signs in last 24 hours: Temp:  [98.1 F (36.7 C)-98.7 F (37.1 C)] 98.5 F (36.9 C) (08/06 0516) Pulse Rate:  [65-77] 65 (08/06 0516) Resp:  [19-20] 19 (08/06 0516) BP: (136-149)/(69-77) 149/74 (08/06 0516) SpO2:  [97 %-100 %] 97 % (08/06 0516) Last BM Date: 12/03/15  Intake/Output from previous day: 08/05 0701 - 08/06 0700 In: 2200 [P.O.:1400; IV Piggyback:800] Out: 1900 [Urine:1900] Intake/Output this shift: No intake/output data recorded.  Right axilla with resolving erythema, dressing removed, clean, repacked  Lab Results:   Recent Labs  12/03/15 0430 12/04/15 0511  WBC 11.8* 8.4  HGB 10.2* 10.3*  HCT 32.9* 32.6*  PLT 393 391   BMET  Recent Labs  12/03/15 0430 12/04/15 0511  NA 134* 137  K 3.4* 3.9  CL 101 102  CO2 27 28  GLUCOSE 150* 104*  BUN 6 7  CREATININE 0.74 0.77  CALCIUM 8.6* 8.8*   PT/INR No results for input(s): LABPROT, INR in the last 72 hours. ABG No results for input(s): PHART, HCO3 in the last 72 hours.  Invalid input(s): PCO2, PO2  Studies/Results: No results found.  Anti-infectives: Anti-infectives    Start     Dose/Rate Route Frequency Ordered Stop   12/01/15 0830  vancomycin (VANCOCIN) IVPB 1000 mg/200 mL premix     1,000 mg 200 mL/hr over 60 Minutes Intravenous Every 12 hours 11/30/15 2033     11/30/15 1915  vancomycin (VANCOCIN) 2,000 mg in sodium chloride 0.9 % 500 mL IVPB     2,000 mg 250 mL/hr over 120 Minutes Intravenous  Once 11/30/15 1815 11/30/15 2211      Assessment/Plan: POD 2 I/D right axillary abscess Will change dressing today and start dressing changes, await final cultures, mod staph aureus present FEN:  Cardiac diet ID: Day 5 of vancomycin DVT:  Lovenox/SCD    [facility] 12/04/2015

## 2015-12-04 NOTE — Progress Notes (Signed)
Family Medicine Teaching Service Daily Progress Note Intern Pager: [phone]  Patient name: Veronica [name] Medical record number: [id number] Date of birth: [birth year] Age: 55 y.o. Gender: female  Primary Care Provider: Beaulah Dinning, MD Consultants: Surgery Code Status: FULL  Pt Overview and Major Events to Date:  Vancomycin start 8/2 PICC line placed 8/3 Surgical I&D 8/4   Assessment and Plan: 55 y/o F with PMH sig for HTN, HLD, SLE presenting with R arm cellulitis.  #Right arm cellulitis Admitted for worsening pain and swelling of right arm. Soft tissue U/S showed 1.2 cm complex subcutaneous fluid collection compatible with abscess loc 5mm below skin surface at visible furuncle. S/p repeat I&D 8/4. Has been afebrile since admission. Erythema now limited to a few cm around site of I&D.  - received one dose of doxycycline 100mg  8/3 - IV Vancomycin (Day 5) per pharm - Percocet 5-325 mg po Q4 prn for pain (only had 2 doses 8/5) - prn morphine for dressing changes - Bcx ordered x2 in process: Neg x 3 days.  - Nasal MRSA PCR positive - Aerobic and anaerobic cx sent after I&D --> moderate Staph aureus; sensitivities pending  - Surgery consulted and following, appreciate recommendations; spoke with on-call surgeon Dr. Luisa Hart who agreed it would be appropriate to discharge patient on PO as early as today if sensitivities result - Leukocytosis improving 19.6 > 8.4   #Left axilla boil/hydradenitis suppurativa - has another boil under left arm, about 1/4 inch open wound with some serous drainage. Tender to touch but not warm. - Apply warm compresses q2h to encourage drainage  #Hypokalemia: Resolved. 2.9 on admission. - K+ 3.9 8/6 - replete as needed and monitor BMPs - Repeat Mg if continues to be low  #Hypertension on admission 148/85, 136/69-149/74 over last 24 hours; likely in setting of discomfort - continue home Amlodipine 5mg  daily - continue home HCTZ 25mg  daily -  continue home losartan 100 mg - continue home Carvedilol 6.25mg  po bid  #Hyperlipidemia -continue home Atorvastatin 10mg  po daily  # H/O migraine- no current headache - on duloxetine 30 qD- started for concern that her chronic headaches were in part contributed to by her fibromyalgia on 02/03/2015  #FEN/GI: Diet Heart #Prophylaxis: Lovenox   Disposition:  Admitted to inpatient service for IV antibiotics and treatment of her cellulitis. Dispo pending culture sensitivities.  Subjective:  Patient feeling well this morning. Had not been in much pain until dressing change this am. Continues to have good appetite. Was not able to use heating pad yesterday under left axilla because "pad was too big." Said she could do warm washcloths today.  Objective: Temp:  [98.1 F (36.7 C)-98.7 F (37.1 C)] 98.5 F (36.9 C) (08/06 0516) Pulse Rate:  [65-77] 65 (08/06 0516) Resp:  [19-20] 19 (08/06 0516) BP: (136-170)/(69-77) 149/74 (08/06 0516) SpO2:  [97 %-100 %] 97 % (08/06 0516)   Physical Exam: General: Well developed, well nourished, cooperative and pleasant ENTM: Moist mucous membranes, normal oropharynx Cardiovascular: Heart sounds are normal. Regular rate and rhythm without murmur, gallop or rub. Respiratory: CTA B/L no rales rhonchi or wheezing Abdomen: Soft, non-tender, +BS Skin: right arm with tender area of induration surrounding 2x3cm area of packing with now minimal erythema greatly reduced from originally marked area; open area (~1x2cm) of with some granulation tissue and induration and serous drainage under L axilla Neuro: Alert and oriented, normal. strength and sensation  Laboratory:  Recent Labs Lab 12/02/15 0410 12/03/15 0430 12/04/15 0511  WBC  14.8* 11.8* 8.4  HGB 10.6* 10.2* 10.3*  HCT 33.6* 32.9* 32.6*  PLT 383 393 391    Recent Labs Lab 12/02/15 0410 12/03/15 0430 12/04/15 0511  NA 137 134* 137  K 3.3* 3.4* 3.9  CL 104 101 102  CO2 25 27 28   BUN 6 6 7    CREATININE 0.80 0.74 0.77  CALCIUM 8.7* 8.6* 8.8*  GLUCOSE 122* 150* 104*    Imaging/Diagnostic Tests:  [place] Chest  Result Date: 11/30/2015 CLINICAL DATA:  55 year old female with right axillary cellulitis. EXAM: CHEST ULTRASOUND COMPARISON:  Right upper extremity ultrasound from today reported separately. Chest CT 12/09/2014. FINDINGS: Ultrasound imaging in the right axillary region directly over of visible furuncle reveals a 1.2 cm diamond-shaped area of complex nonvascular material located about 5 mm below the skin surface (images 3 and 4). Surrounding diffuse soft tissue edema. No other organized or drainable fluid collection. IMPRESSION: 1. A 1.2 cm complex subcutaneous collection compatible with abscess is located 5 mm below the skin surface at the visible furuncle. 2. No other organized or drainable fluid collection. Electronically Signed   By: Odessa Fleming M.D.   On: 11/30/2015 22:48   [place] Extrem Up Right Ltd  Result Date: 11/30/2015 CLINICAL DATA:  55 year old female with right axillary cellulitis. Initial encounter. EXAM: ULTRASOUND RIGHT UPPER EXTREMITY LIMITED TECHNIQUE: Ultrasound examination of the upper extremity soft tissues was performed in the area of clinical concern. COMPARISON:  None FINDINGS: Ultrasound images demonstrate diffuse subcutaneous edema in the area of imaging. No organized or drainable fluid collection. IMPRESSION: Diffuse soft tissue edema with no organized or drainable fluid collection. Electronically Signed   By: Odessa Fleming M.D.   On: 11/30/2015 22:43    Khloi Rawl Percell Boston, MD 12/04/2015, 6:23 AM PGY-2, [facility] FPTS Intern pager: [phone], text pages welcome

## 2015-12-05 ENCOUNTER — Encounter (HOSPITAL_COMMUNITY): Payer: Self-pay | Admitting: *Deleted

## 2015-12-05 ENCOUNTER — Encounter (HOSPITAL_COMMUNITY): Payer: Self-pay | Admitting: General Surgery

## 2015-12-05 DIAGNOSIS — E876 Hypokalemia: Secondary | ICD-10-CM

## 2015-12-05 LAB — CULTURE, BLOOD (ROUTINE X 2)
Culture: NO GROWTH
Culture: NO GROWTH

## 2015-12-05 MED ORDER — CLINDAMYCIN HCL 300 MG PO CAPS: 300.0000 mg | ORAL_CAPSULE | Freq: Four times a day (QID) | ORAL | 0 refills | Status: DC

## 2015-12-05 NOTE — Progress Notes (Signed)
[facility] Progress Note  3 Days Post-Op  Subjective: Reports no acute events overnight, no complaints this morning. States her R axilla is still tender, but improving. Denies fever, chills, night sweats. Ambulating. Urinating without hesitancy. Last BM was 6 days ago.  Objective: Vital signs in last 24 hours: Temp:  [98.6 F (37 C)-98.7 F (37.1 C)] 98.7 F (37.1 C) (08/07 0627) Pulse Rate:  [68-75] 71 (08/07 0627) Resp:  [19-20] 20 (08/07 0627) BP: (144-196)/(80-88) 144/88 (08/07 0627) SpO2:  [96 %-100 %] 96 % (08/07 0627) Last BM Date: 12/03/15  Intake/Output from previous day: 08/06 0701 - 08/07 0700 In: 960 [P.O.:960] Out: 3850 [Urine:3850] Intake/Output this shift: No intake/output data recorded.  PE: Right axilla s/p I&D with packing in placy. erythema and induration improving significantly. ~4 cm   Lab Results:   Recent Labs  12/03/15 0430 12/04/15 0511  WBC 11.8* 8.4  HGB 10.2* 10.3*  HCT 32.9* 32.6*  PLT 393 391   BMET  Recent Labs  12/03/15 0430 12/04/15 0511  NA 134* 137  K 3.4* 3.9  CL 101 102  CO2 27 28  GLUCOSE 150* 104*  BUN 6 7  CREATININE 0.74 0.77  CALCIUM 8.6* 8.8*   PT/INR No results for input(s): LABPROT, INR in the last 72 hours. CMP     Component Value Date/Time   NA 137 12/04/2015 0511   K 3.9 12/04/2015 0511   CL 102 12/04/2015 0511   CO2 28 12/04/2015 0511   GLUCOSE 104 (H) 12/04/2015 0511   BUN 7 12/04/2015 0511   CREATININE 0.77 12/04/2015 0511   CREATININE 0.81 10/08/2014 1604   CALCIUM 8.8 (L) 12/04/2015 0511   PROT 7.6 09/12/2012 1048   ALBUMIN 4.1 09/12/2012 1048   AST 13 09/12/2012 1048   ALT 9 09/12/2012 1048   ALKPHOS 48 09/12/2012 1048   BILITOT 0.5 09/12/2012 1048   GFRNONAA >60 12/04/2015 0511   GFRNONAA 84 10/08/2014 1604   GFRAA >60 12/04/2015 0511   GFRAA >89 10/08/2014 1604   Lipase     Component Value Date/Time   LIPASE 40 07/28/2012 1528       Studies/Results: No  results found.  Anti-infectives: Anti-infectives    Start     Dose/Rate Route Frequency Ordered Stop   12/01/15 0830  vancomycin (VANCOCIN) IVPB 1000 mg/200 mL premix     1,000 mg 200 mL/hr over 60 Minutes Intravenous Every 12 hours 11/30/15 2033     11/30/15 1915  vancomycin (VANCOCIN) 2,000 mg in sodium chloride 0.9 % 500 mL IVPB     2,000 mg 250 mL/hr over 120 Minutes Intravenous  Once 11/30/15 1815 11/30/15 2211     Assessment/Plan Hydradenitis Suppurativa  Abscess of right arm s/p I&D (Dr. Jimmye Norman, 12/02/15)  Cellulitis of R arm - improving after I&D and IV abx - continue daily dressing changes  - Cx - moderate staph aureus; sensitive to tetracycline, ciprofloxacin, and rifampin  - 5 total days of vanc per pharmacy  - WBC trended down to normal: 8.4 yesterday   Left axillary boil - small, <2 cm, not indurated, actively draining; warm compresses  HTN HLD SLE  FEN: heart healthy diet ID: vancomycin day 5/5  DVT Proph: Lovenox   Dispo: transition to PO abx based on sensitivities. From a surgical standpoint patient may be discharged home with daily dressing changes and will follow-up for a wound-check in 1 week with Dr. Lindie Spruce.     LOS: 5 days  Adam Phenix , [facility] 12/05/2015, 7:47 AM Pager: [phone] Consults: [phone] Mon-Fri 7:00 am-4:30 pm Sat-Sun 7:00 am-11:30 am

## 2015-12-08 LAB — AEROBIC/ANAEROBIC CULTURE (SURGICAL/DEEP WOUND)

## 2015-12-08 LAB — AEROBIC/ANAEROBIC CULTURE W GRAM STAIN (SURGICAL/DEEP WOUND)

## 2015-12-09 ENCOUNTER — Encounter: Payer: Self-pay | Admitting: Family Medicine

## 2015-12-09 ENCOUNTER — Ambulatory Visit (INDEPENDENT_AMBULATORY_CARE_PROVIDER_SITE_OTHER): Payer: BLUE CROSS/BLUE SHIELD | Admitting: Family Medicine

## 2015-12-09 DIAGNOSIS — L02429 Furuncle of limb, unspecified: Secondary | ICD-10-CM

## 2015-12-09 DIAGNOSIS — L02439 Carbuncle of limb, unspecified: Secondary | ICD-10-CM

## 2015-12-09 MED ORDER — DOXYCYCLINE HYCLATE 100 MG PO TABS: 100.0000 mg | ORAL_TABLET | Freq: Two times a day (BID) | ORAL | 0 refills | Status: DC

## 2015-12-09 NOTE — Assessment & Plan Note (Signed)
I'm concerned because she is starting to have chills despite being on antibiotics. I note that her microbiological specimen showed some gram-negative rods along with MRSA. Clindamycin would likely not cover that Switching her to doxycycline now. Would prefer Bactrim but evidently she has potential cross-reactivity allergy to this and Tessalon. She has follow-up with surgery on Monday. I packed her wound in the clinic today. Also recovered with gauze. Follow-up with surgery on Monday. Follow-up with [place] later next week to assess for improvement.  Do not wait to follow-up if she begins having worsening chills, fever, N/V.

## 2015-12-09 NOTE — Patient Instructions (Addendum)
I have sent in the Doxycycline. Take this twice a day until you see the surgeon.  Try to keep the packing in.  If it falls out, that's okay.    If you start having worsening chills, nausea, not being to eat, or fevers despite antibiotics need to be seen.  It was good to see you today!

## 2015-12-09 NOTE — Progress Notes (Signed)
Subjective:    Veronica [name] is a 55 y.o. female who presents to [facility] today for hospital FU for cellulitis and abscess:  1.  Cellulitis/abscess:  Patient diagnosed with right axilla cellulitis and abscess on 731. She had incision and drainage that point. She started doxycycline. She returned to clinic several days later on August 2. She was admitted from clinic due to worsening of cellulitis. On the hospital she had surgical drainage of the abscess. She is on IV antibiotics and transitioned to clindamycin. She was discharged home on Monday (4 days ago). She finished her kanamycin yesterday. Since being discharged home she has had intermittent chills. No actual fevers. No drainage from open wound right arm. She evidently has not been keeping this covered.  No nausea vomiting.  ROS as above per HPI.  The following portions of the patient's history were reviewed and updated as appropriate: allergies, current medications, past medical history, family and social history, and problem list. Patient is a nonsmoker.    PMH reviewed.  Past Medical History:  Diagnosis Date  . Daily headache   . Fibromyalgia   . History of blood transfusion 2010   "before hysterectomy"  . Hx of dizziness   . Hypertension   . Pneumonia 06/2014; 07/2014  . Rheumatoid arthritis (HCC)   . SLE (systemic lupus erythematosus) (HCC) 1998   Past Surgical History:  Procedure Laterality Date  . ABDOMINAL HYSTERECTOMY  09/2008  . CESAREAN SECTION  1992, 1995  . CHOLECYSTECTOMY N/A 07/18/2012   Procedure: LAPAROSCOPIC CHOLECYSTECTOMY WITH INTRAOPERATIVE CHOLANGIOGRAM;  Surgeon: Atilano Ina, MD;  Location: [facility] OR;  Service: General;  Laterality: N/A;  . ERCP N/A 07/29/2012   Procedure: ENDOSCOPIC RETROGRADE CHOLANGIOPANCREATOGRAPHY (ERCP);  Surgeon: Theda Belfast, MD;  Location: [facility];  Service: Endoscopy;  Laterality: N/A;  Dr. Elnoria Howard said he would start this PT arond 1330( AW)  . INCISION AND DRAINAGE Right 11/28/2015    "boil on upper arm; did this in dr's office"  . INCISION AND DRAINAGE ABSCESS Right 12/02/2015   Procedure: INCISION AND DRAINAGE ABSCESS;  Surgeon: Jimmye Norman, MD;  Location: [facility] OR;  Service: General;  Laterality: Right;  Right axillary abscess  . TUBAL LIGATION  1995    Medications reviewed. Current Outpatient Prescriptions  Medication Sig Dispense Refill  . amLODipine (NORVASC) 5 MG tablet Take 1 tablet (5 mg total) by mouth every evening. 30 tablet 11  . aspirin-acetaminophen-caffeine (EXCEDRIN MIGRAINE) 250-250-65 MG tablet Take 1 tablet by mouth every 6 (six) hours as needed for headache. Reported on 04/19/2015    . atorvastatin (LIPITOR) 10 MG tablet TAKE ONE TABLET BY MOUTH ONCE DAILY 30 tablet 3  . carvedilol (COREG) 6.25 MG tablet Take 1 tablet (6.25 mg total) by mouth 2 (two) times daily with a meal. 60 tablet 2  . cetirizine (ZYRTEC) 10 MG tablet Take 10 mg by mouth as needed for allergies.    . clindamycin (CLEOCIN) 300 MG capsule Take 1 capsule (300 mg total) by mouth 4 (four) times daily. 16 capsule 0  . clindamycin (CLINDAGEL) 1 % gel Apply topically 2 (two) times daily as needed. Please use for boils as needed. 30 g 2  . DULoxetine (CYMBALTA) 30 MG capsule Take 1 capsule (30 mg total) by mouth daily. 30 capsule 3  . fluticasone (FLONASE) 50 MCG/ACT nasal spray Place 2 sprays into both nostrils daily. (Patient taking differently: Place 2 sprays into both nostrils daily as needed for allergies. ) 16 g 0  .  hydrochlorothiazide (HYDRODIURIL) 25 MG tablet TAKE ONE TABLET BY MOUTH DAILY. 90 tablet 0  . ibuprofen (ADVIL,MOTRIN) 200 MG tablet Take 800 mg by mouth every 6 (six) hours as needed for headache. Reported on 04/19/2015    . losartan (COZAAR) 100 MG tablet TAKE ONE TABLET BY MOUTH ONCE DAILY 90 tablet 1  . naproxen (EC NAPROSYN) 500 MG EC tablet Take 1 tablet (500 mg total) by mouth 2 (two) times daily with a meal. (Patient taking differently: Take 500 mg by mouth 2 (two)  times daily as needed. ) 10 tablet 1  . potassium chloride (K-DUR) 10 MEQ tablet Take 1 tablet (10 mEq total) by mouth daily. 90 tablet 0   No current facility-administered medications for this visit.      Objective:   Physical Exam There were no vitals taken for this visit. Gen:  Alert, cooperative patient who appears stated age in no acute distress.  Vital signs reviewed. HEENT: EOMI,  MMM Cardiac:  Regular rate and rhythm without murmur auscultated.  Good S1/S2. Pulm:  Clear to auscultation bilaterally with good air movement.  No wheezes or rales noted.   Skin:  3 cm open wound still noted axilla right arm. She does not have any surrounding cellulitis. She does have some mild induration surrounding the wound. There is no packing or gauze around the wound. No warmth. No drainage.  No results found for this or any previous visit (from the past 72 hour(s)).

## 2015-12-23 ENCOUNTER — Telehealth: Payer: Self-pay | Admitting: Family Medicine

## 2015-12-23 DIAGNOSIS — I10 Essential (primary) hypertension: Secondary | ICD-10-CM

## 2015-12-23 NOTE — Telephone Encounter (Signed)
pt is calling for a refill on her Coreg. jw

## 2015-12-26 MED ORDER — CARVEDILOL 6.25 MG PO TABS: 6.2500 mg | ORAL_TABLET | Freq: Two times a day (BID) | ORAL | 4 refills | Status: DC

## 2015-12-26 NOTE — Telephone Encounter (Signed)
Coreg refilled.

## 2016-01-17 ENCOUNTER — Other Ambulatory Visit: Payer: Self-pay | Admitting: Family Medicine

## 2016-01-27 ENCOUNTER — Ambulatory Visit: Payer: BLUE CROSS/BLUE SHIELD | Admitting: Family Medicine

## 2016-02-02 ENCOUNTER — Ambulatory Visit: Payer: BLUE CROSS/BLUE SHIELD | Admitting: Family Medicine

## 2016-02-06 NOTE — Progress Notes (Signed)
CC: right axilla boil  HPI: Veronica [name] is a 55 y.o. female with PMH SLE, HLD, HTN, and recent hospitalization for IV treatment of cellulitis who presents to [facility] today for yeast infection and recurrent boils.  Hidradenitis Suppurativa - (11/30/2015) Patient recently hospitalized for right axilla celluliitis and abscess secondary to hidradenitis suppurativa for  IV antibiotics. She completed a course of clindamycin as well as doxycycline. - Today she presents with complaints of multiple subsequent episodes of boils since she was previously seen, most recently, 5 in the vaginal region which have completely resolved as well as one in the right axilla region  - The boil in the right axilla region has improved over the last week. She denies every noticing redness, drainage, fluctuance. No recent fevers or chills. - She endorses using Clindagel cream in affected areas, she also endorses using a few leftover doxycycline pills the past 3 days - She endorses being very frustrated with recurrent boils and would like recommendations on how to prevent these from occurring in the future  Vaginal Pruritis - Patient believes she has a yeast infection secondary to recent antibiotic use - She has a history of yeast infections most recently in august when she was hospitalized for antibiotics - Endorses pruritis, discomfort, no abnormal discharge, no changes in urination such as frequency, dysuria or hematuria, no pelvic pain - Endorses using a cream she got from the dollar store to help with vaginal pruritis, however symptoms have not improved  Medication Refill - Patient would like refill of cymbalta, which was originally started due to concern that her recurrent headaches were from fibromyalgia - denies current headaches  Review of Symptoms: See HPI for ROS.   CC, SH/smoking status, and VS noted.  Objective: BP (!) 149/86   Pulse (!) 59   Temp 98.5 F (36.9 C) (Oral)   Ht 5\' 4"  (1.626 m)   Wt  233 lb 3.2 oz (105.8 kg)   BMI 40.03 kg/m  GEN: NAD, alert, cooperative, and pleasant. SKIN: right axilla: + 1 red boil ~0.5 cm in diameter without drainage, fluctuance, erythema or warmth. No other boils rashes or lesions appreciated. RESPIRATORY: clear to auscultation bilaterally with no wheezes, rhonchi or rales, good effort CV: RRR, no m/r/g, no peripheral edema GI: soft, non-tender, non-distended, normoactive bowel sounds, no hepatosplenomegaly PSYCH: AAOx3, appropriate affect  Assessment and plan:  Hydradenitis - current outbreak does not appear to be infected or fluctuant warranting drainage; no oral antibiotics indicated at this time - patient given instructions regarding conservative measures for prevention including bleach baths, ceasing use of deodorant, wearing loose clothing and preventing excessive warmth or sweatiness - warm compresses recommended - ambulatory referral to surgery for evaluation regarding preventative intervention for persistent hidradenitis suppurativa which has resulted in previous hospitalization  Yeast infection of the vagina - treated with fluconazole 150 mg x 1 dose, patient instructed to take second dose tomorrow if symptoms have not improved - patient advised to discontinue use of extra vaginal creams  - patient advised to discontinue use of extra antibiotics and please use medications as prescribed  Health care maintenance - cymbalta was originally started due to concern that her recurrent headaches may have been due to fibromyalgia - refilled today    Orders Placed This Encounter  Procedures  . Ambulatory referral to General Surgery    Referral Priority:   Routine    Referral Type:   Surgical    Referral Reason:   Specialty Services Required  Requested Specialty:   General Surgery    Number of Visits Requested:   1    Meds ordered this encounter  Medications  . fluconazole (DIFLUCAN) 150 MG tablet    Sig: Take 1 tablet (150 mg  total) by mouth once.    Dispense:  3 tablet    Refill:  0  . DULoxetine (CYMBALTA) 30 MG capsule    Sig: Take 1 capsule (30 mg total) by mouth daily.    Dispense:  30 capsule    Refill:  3     Howard Pouch, MD,MS,  PGY1 02/07/2016 11:30 AM

## 2016-02-07 ENCOUNTER — Encounter: Payer: Self-pay | Admitting: Student in an Organized Health Care Education/Training Program

## 2016-02-07 ENCOUNTER — Ambulatory Visit (INDEPENDENT_AMBULATORY_CARE_PROVIDER_SITE_OTHER): Payer: BLUE CROSS/BLUE SHIELD | Admitting: Student in an Organized Health Care Education/Training Program

## 2016-02-07 ENCOUNTER — Other Ambulatory Visit: Payer: Self-pay | Admitting: Family Medicine

## 2016-02-07 VITALS — BP 149/86 | HR 59 | Temp 98.5°F | Ht 64.0 in | Wt 233.2 lb

## 2016-02-07 DIAGNOSIS — Z0000 Encounter for general adult medical examination without abnormal findings: Secondary | ICD-10-CM | POA: Diagnosis not present

## 2016-02-07 DIAGNOSIS — B373 Candidiasis of vulva and vagina: Secondary | ICD-10-CM | POA: Diagnosis not present

## 2016-02-07 DIAGNOSIS — L732 Hidradenitis suppurativa: Secondary | ICD-10-CM

## 2016-02-07 DIAGNOSIS — B3731 Acute candidiasis of vulva and vagina: Secondary | ICD-10-CM

## 2016-02-07 DIAGNOSIS — Z23 Encounter for immunization: Secondary | ICD-10-CM

## 2016-02-07 MED ORDER — DULOXETINE HCL 30 MG PO CPEP: 30.0000 mg | ORAL_CAPSULE | Freq: Every day | ORAL | 3 refills | Status: DC

## 2016-02-07 MED ORDER — FLUCONAZOLE 150 MG PO TABS: 150.0000 mg | ORAL_TABLET | Freq: Once | ORAL | 0 refills | Status: AC

## 2016-02-07 NOTE — Patient Instructions (Signed)
Hidradenitis Suppurativa Hidradenitis suppurativa is a Robart-term (chronic) skin disease that starts with blocked sweat glands or hair follicles. Bacteria may grow in these blocked openings of your skin. Hidradenitis suppurativa is like a severe form of acne that develops in areas of your body where acne would be unusual. It is most likely to affect the areas of your body where skin rubs against skin and becomes moist. This includes your:  Underarms.  Groin.  Genital areas.  Buttocks.  Upper thighs.  Breasts. Hidradenitis suppurativa may start out with small pimples. The pimples can develop into deep sores that break open (rupture) and drain pus. Over time your skin may thicken and become scarred. Hidradenitis suppurativa cannot be passed from person to person.  CAUSES  The exact cause of hidradenitis suppurativa is not known. This condition may be due to:  Female and female hormones. The condition is rare before and after puberty.  An overactive body defense system (immune system). Your immune system may overreact to the blocked hair follicles or sweat glands and cause swelling and pus-filled sores. RISK FACTORS You may have a higher risk of hidradenitis suppurativa if you:  Are a woman.  Are between ages 47 and 29.  Have a family history of hidradenitis suppurativa.  Have a personal history of acne.  Are overweight.  Smoke.  Take the drug lithium. SIGNS AND SYMPTOMS  The first signs of an outbreak are usually painful skin bumps that look like pimples. As the condition progresses:  Skin bumps may get bigger and grow deeper into the skin.  Bumps under the skin may rupture and drain smelly pus.  Skin may become itchy and infected.  Skin may thicken and scar.  Drainage may continue through tunnels under the skin (fistulas).  Walking and moving your arms can become painful. DIAGNOSIS  Your health care provider may diagnose hidradenitis suppurativa based on your medical  history and your signs and symptoms. A physical exam will also be done. You may need to see a health care provider who specializes in skin diseases (dermatologist). You may also have tests done to confirm the diagnosis. These can include:  Swabbing a sample of pus or drainage from your skin so it can be sent to the lab and tested for infection.  Blood tests to check for infection. TREATMENT  The same treatment will not work for everybody with hidradenitis suppurativa. Your treatment will depend on how severe your symptoms are. You may need to try several treatments to find what works best for you. Part of your treatment may include cleaning and bandaging (dressing) your wounds. You may also have to take medicines, such as the following:  Antibiotics.  Acne medicines.  Medicines to block or suppress the immune system.  A diabetes medicine (metformin) is sometimes used to treat this condition.  For women, birth control pills can sometimes help relieve symptoms. You may need surgery if you have a severe case of hidradenitis suppurativa that does not respond to medicine. Surgery may involve:   Using a laser to clear the skin and remove hair follicles.  Opening and draining deep sores.  Removing the areas of skin that are diseased and scarred. HOME CARE INSTRUCTIONS  Learn as much as you can about your disease, and work closely with your health care providers.  Take medicines only as directed by your health care provider.  If you were prescribed an antibiotic medicine, finish it all even if you start to feel better.  If you are  overweight, losing weight may be very helpful. Try to reach and maintain a healthy weight.  Do not use any tobacco products, including cigarettes, chewing tobacco, or electronic cigarettes. If you need help quitting, ask your health care provider.  Do not shave the areas where you get hidradenitis suppurativa.  Do not wear deodorant.  Wear loose-fitting  clothes.  Try not to overheat and get sweaty.  Take a daily bleach bath as directed by your health care provider.  Fill your bathtub halfway with water.  Pour in  cup of unscented household bleach.  Soak for 5-10 minutes.  Cover sore areas with a warm, clean washcloth (compress) for 5-10 minutes.  Is there anything I can do on my own to feel better? - Yes. First, you should know that you did not do anything to cause your condition. It is not your fault. You did not cause it by being unclean. You should also know that you cannot spread your condition to anyone else. It is not "contagious."  Here are some things you can do to reduce your symptoms:  ?Do not wear tight-fitting clothes ?Try to avoid activities that cause your skin to rub against itself a lot ?Shower every day and wash areas of HS gently with your fingers. Do not scrub affected areas with a washcloth, loofah, or brush. ?Stop smoking, if you smoke. People who smoke are more likely to have HS ?Lose weight, if you are overweight. HS is more common and more severe in people who are overweight.

## 2016-02-07 NOTE — Assessment & Plan Note (Addendum)
-   cymbalta was originally started due to concern that her recurrent headaches may have been due to fibromyalgia - refilled today

## 2016-02-07 NOTE — Assessment & Plan Note (Addendum)
-   current outbreak does not appear to be infected or fluctuant warranting drainage; no oral antibiotics indicated at this time - patient given instructions regarding conservative measures for prevention including bleach baths, ceasing use of deodorant, wearing loose clothing and preventing excessive warmth or sweatiness - warm compresses recommended - ambulatory referral to surgery for evaluation regarding preventative intervention for persistent hidradenitis suppurativa which has resulted in previous hospitalization

## 2016-02-07 NOTE — Assessment & Plan Note (Signed)
-   treated with fluconazole 150 mg x 1 dose, patient instructed to take second dose tomorrow if symptoms have not improved - patient advised to discontinue use of extra vaginal creams  - patient advised to discontinue use of extra antibiotics and please use medications as prescribed

## 2016-03-21 ENCOUNTER — Ambulatory Visit (INDEPENDENT_AMBULATORY_CARE_PROVIDER_SITE_OTHER): Payer: BLUE CROSS/BLUE SHIELD | Admitting: Internal Medicine

## 2016-03-21 DIAGNOSIS — R058 Other specified cough: Secondary | ICD-10-CM

## 2016-03-21 DIAGNOSIS — R05 Cough: Secondary | ICD-10-CM

## 2016-03-21 MED ORDER — CETIRIZINE HCL 10 MG PO TABS: 10.0000 mg | ORAL_TABLET | ORAL | 1 refills | Status: DC | PRN

## 2016-03-21 MED ORDER — FLUTICASONE PROPIONATE 50 MCG/ACT NA SUSP: 2.0000 | Freq: Every day | NASAL | 0 refills | Status: DC

## 2016-03-21 NOTE — Progress Notes (Signed)
   [name] Noralee Chars, MD Phone: [phone]  Reason For Visit:  SDA for Cough  Post-viral cough  Has been sick for about 3 weeks,  still with night time cough and nasal congestion. Patient states she will often have a cough at night or in the morning when she wakes up. Notes having white phelgm from the cough. Came in to get checked out because she has had pneumonia in the past Medications tried: Robitusson- DM  Sick contacts: none   Symptoms Fever: None  Headache or face pain: None  Tooth pain: None  Sneezing: None  Scratchy throat: None  Allergies: Occasional has allergies Muscle aches: None  Severe fatigue: None  Shortness of breath: None  Sore throat or swollen glands: None  ROS see HPI Smoking Status noted  Objective: BP (!) 158/85 (BP Location: Left Arm, Patient Position: Sitting, Cuff Size: Normal)   Pulse (!) 58   Temp 97.6 F (36.4 C) (Oral)   Wt 234 lb 3.2 oz (106.2 kg)   SpO2 100%   BMI 40.20 kg/m  Gen: NAD, alert, cooperative with exam HEENT: Normal    Neck: No masses palpated. No lymphadenopathy    Nose: nasal turbinates ethryematous and boggy     Throat: moist mucus membranes, post-nasal drainage noted  Cardio: regular rate and rhythm, S1S2 heard, no murmurs appreciated Pulm: clear to auscultation bilaterally, no wheezes, rhonchi or rales  Assessment/Plan: See problem based a/p  Post-viral cough syndrome Post-viral cough with post- nasal drip. Exam consistent. No signs or symptoms concerning for pneumonia  - cetirizine (ZYRTEC) 10 MG tablet; Take 1 tablet (10 mg total) by mouth as needed for allergies.   - fluticasone (FLONASE) 50 MCG/ACT nasal spray; Place 2 sprays into both nostrils daily.  Dispense: 16 g; Refill: 0 - Return precautions given

## 2016-03-21 NOTE — Patient Instructions (Signed)
I believe you have a post-viral cough this is due congestion that is dripping down the back of your throat and irritating your throat at night. I would continue the Robitussin-DM. I would add on Flonase and Zrytec. If you start to develop fevers, SOB or just general feel you are worsening please return for reevaluation

## 2016-03-21 NOTE — Assessment & Plan Note (Signed)
Post-viral cough with post- nasal drip. Exam consistent. No signs or symptoms concerning for pneumonia  - cetirizine (ZYRTEC) 10 MG tablet; Take 1 tablet (10 mg total) by mouth as needed for allergies.   - fluticasone (FLONASE) 50 MCG/ACT nasal spray; Place 2 sprays into both nostrils daily.  Dispense: 16 g; Refill: 0 - Return precautions given

## 2016-05-31 ENCOUNTER — Other Ambulatory Visit: Payer: Self-pay | Admitting: Family Medicine

## 2016-06-12 NOTE — Progress Notes (Signed)
Subjective:  Veronica [name] is a 56 y.o. year old female who presents to office today for an annual physical examination.  Concerns today include:  1.Hypertension Blood pressure at home: Does not check  Exercise: Walks 30 minutes 3 days a week  Low salt diet: does not follow Medications: Compliant with Amlodipine 5 mg daily, coreg 6.25 mg daily, HCTZ 25 mg daily, losartan 100 mg daily, KDUR 10 meq daily  Side effects: ROS: Denies headache, dizziness, visual changes, nausea, vomiting, chest pain, abdominal pain or shortness of breath. BP Readings from Last 3 Encounters:  06/13/16 (!) 148/86  03/21/16 (!) 158/85  02/07/16 (!) 149/86   2. Hyperlipidemia:  Symptoms Chest pain on exertion:  no   Leg claudication:   no Medications : Is supposed to be taking a statin medication but she has not been taking this for the last couple months because she didn't think the medication was necessary.  Compliance- no Right upper quadrant pain- no  Muscle aches- no  3. Concerns for medications: She was started on Duloxetine sometime last year. She has gone several months without it. She does not know why she was on this medication. She also does not know why she is on the cholesterol medication. She denies any depression, headaches.   4. Concerns for Endometriosis: Patient states that when was 23 she was diagnosed with endometriosis. She notes that they did surgery at that time. She had a hysterectomy in 2010. Has always had painful intercourse. Does not get menses anymore. She notes that she does have vaginal discharge, white. No bloody discharge. Denies any fevers, chills.   5. SLE: Notes that she is not taking any lupus medications. Denies any joint pains at this time  Review of Systems Constitutional: negative for fatigue and fevers Eyes: negative for visual disturbance Ears, nose, mouth, throat, and face: negative for nasal congestion and sore throat Respiratory: negative for cough and  wheezing Cardiovascular: negative for chest pain and palpitations Gastrointestinal: negative for abdominal pain, constipation, diarrhea and nausea Genitourinary:positive for painful intercourse (has occured her whole life), negative for abnormal menstrual periods and vaginal discharge and dysuria Integument/breast: negative for breast tenderness and rash Hematologic/lymphatic: negative Musculoskeletal:negative for back pain Neurological: negative for dizziness, gait problems, headaches and weakness Behavioral/Psych: negative for depression Endocrine: negative for diabetic symptoms including blurry vision, polydipsia and polyuria Allergic/Immunologic: negative for rash    General Healthcare: Medication Compliance: yes Dx Hypertension: yes Dx Hyperlipidemia: yes Diabetes: no Dx Obesity: yes Weight Loss: no Physical Activity: walks her dog (see above) Urinary Incontinence: No  Menstrual hx: Post menarchal   Social:  reports that she has never smoked. She has never used smokeless tobacco. 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 No breast cancers in her family Cervical/Endometrial >>  - Postmenopausal: Yes - Hysterectomy: Yes  - Vaginal Bleeding: No  Skin >> Suspicious lesions: NO   Health Maintenance Due  Topic Date Due  . MAMMOGRAM  12/30/2014    Past Medical History Past Medical History:  Diagnosis Date  . Daily headache   . Fibromyalgia   . History of blood transfusion 2010   "before hysterectomy"  .  Hx of dizziness   . Hypertension   . Pneumonia 06/2014; 07/2014  . Rheumatoid arthritis (HCC)   . SLE (systemic lupus erythematosus) (HCC)  1998    Medications- reviewed and updated Current Outpatient Prescriptions  Medication Sig Dispense Refill  . amLODipine (NORVASC) 5 MG tablet Take 1 tablet (5 mg total) by mouth every evening. 30 tablet 11  . carvedilol (COREG) 6.25 MG tablet Take 1 tablet (6.25 mg total) by mouth 2 (two) times daily with a meal. 60 tablet 4  . cetirizine (ZYRTEC) 10 MG tablet Take 1 tablet (10 mg total) by mouth as needed for allergies. 30 tablet 1  . hydrochlorothiazide (HYDRODIURIL) 25 MG tablet Take 1 tablet (25 mg total) by mouth daily. 90 tablet 0  . losartan (COZAAR) 100 MG tablet Take 1 tablet (100 mg total) by mouth daily. 90 tablet 1  . potassium chloride (K-DUR) 10 MEQ tablet Take 1 tablet (10 mEq total) by mouth daily. 90 tablet 0  . aspirin-acetaminophen-caffeine (EXCEDRIN MIGRAINE) 250-250-65 MG tablet Take 1 tablet by mouth every 6 (six) hours as needed for headache. Reported on 04/19/2015    . atorvastatin (LIPITOR) 10 MG tablet Take 1 tablet (10 mg total) by mouth daily. 30 tablet 3  . clindamycin (CLINDAGEL) 1 % gel Apply topically 2 (two) times daily as needed. Please use for boils as needed. (Patient not taking: Reported on 06/13/2016) 30 g 2  . fluticasone (FLONASE) 50 MCG/ACT nasal spray Place 2 sprays into both nostrils daily. (Patient not taking: Reported on 06/13/2016) 16 g 0  . ibuprofen (ADVIL,MOTRIN) 200 MG tablet Take 800 mg by mouth every 6 (six) hours as needed for headache. Reported on 04/19/2015     No current facility-administered medications for this visit.     Objective: BP (!) 148/86   Pulse 90   Temp 99.4 F (37.4 C) (Oral)   Ht 5\' 4"  (1.626 m)   Wt 231 lb 6.4 oz (105 kg)   SpO2 97%   BMI 39.72 kg/m  Gen: In no acute distress, alert, cooperative with exam, well groomed, obese HEENT: NCAT, EOMI, PERRL CV: Regular rate and rhythm, normal S1/S2, no murmur Resp: Clear to auscultation bilaterally, no wheezes, non-labored Abd: Soft, Non Tender, Non Distended, bowel  sounds present, no guarding or organomegaly Ext: No edema, warm and well perfused Neuro: Alert and oriented, No gross deficits, normal gait Psych: Normal mood and affect   Assessment/Plan:  Essential hypertension, benign Almost at goal. BP 148/86. Patient notes that she thinks it is high today because she gets nervous when she comes a doctor.  - Has BP cuff at home, will check BP daily and will schedule an appointment next week if it is >140/90 - Continue amlodipine 5 mg daily, coreg 6.25 mg BID daily, HCTZ 25 mg daily, losartan 100 mg daily, KDUR 10 meq daily - Follow up in 3 months if BP normal at home - BMP today  Encounter for annual physical exam Health maintenance up to date besides mammogram. Gave patient information on where to obtain mammogram. Patient doing well overall (see other A/Ps).   HLD (hyperlipidemia) Has not been taking Lipitor as prescribed. Calculated ASCVD risk for patient and it was 9.5%. Discussed what this meant for patient and the important of taking statin. Patient was agreeable to restart Lipitor - Refilled lipitor  - Lipid panel today  SLE (systemic lupus erythematosus) Is not on any medications for this. Is not currently endorsing any symptoms. Will continue to follow  Medication management She was prescribed cymbalta in the past for headaches, patient notes she took herself off of this a couple months ago and has been feeling fine. She does not wish to continue cymbalta. Will take off medication list at this time.   Dyspareunia in female Possibly due to self reported hx of endometriosis. Denies any other symptoms. S/p hysterectomy in 2010. Patient would like to follow up with gynecologist. I do not think she needs a referral with her insurance but explained to her that if she finds out she does need a referral I will be happy to place one for her.    Orders Placed This Encounter  Procedures  . CBC with Differential  . BASIC METABOLIC PANEL WITH GFR    . Lipid Panel    Meds ordered this encounter  Medications  . carvedilol (COREG) 6.25 MG tablet    Sig: Take 1 tablet (6.25 mg total) by mouth 2 (two) times daily with a meal.    Dispense:  60 tablet    Refill:  4  . amLODipine (NORVASC) 5 MG tablet    Sig: Take 1 tablet (5 mg total) by mouth every evening.    Dispense:  30 tablet    Refill:  11  . losartan (COZAAR) 100 MG tablet    Sig: Take 1 tablet (100 mg total) by mouth daily.    Dispense:  90 tablet    Refill:  1  . atorvastatin (LIPITOR) 10 MG tablet    Sig: Take 1 tablet (10 mg total) by mouth daily.    Dispense:  30 tablet    Refill:  3    Please consider 90 day supplies to promote better adherence  . hydrochlorothiazide (HYDRODIURIL) 25 MG tablet    Sig: Take 1 tablet (25 mg total) by mouth daily.    Dispense:  90 tablet    Refill:  0  . potassium chloride (K-DUR) 10 MEQ tablet    Sig: Take 1 tablet (10 mEq total) by mouth daily.    Dispense:  90 tablet    Refill:  0     Anders Simmonds, MD [facility], PGY-2

## 2016-06-13 ENCOUNTER — Ambulatory Visit (INDEPENDENT_AMBULATORY_CARE_PROVIDER_SITE_OTHER): Payer: BLUE CROSS/BLUE SHIELD | Admitting: Family Medicine

## 2016-06-13 ENCOUNTER — Encounter: Payer: Self-pay | Admitting: Family Medicine

## 2016-06-13 VITALS — BP 148/86 | HR 90 | Temp 99.4°F | Ht 64.0 in | Wt 231.4 lb

## 2016-06-13 DIAGNOSIS — E785 Hyperlipidemia, unspecified: Secondary | ICD-10-CM

## 2016-06-13 DIAGNOSIS — Z79899 Other long term (current) drug therapy: Secondary | ICD-10-CM

## 2016-06-13 DIAGNOSIS — Z0000 Encounter for general adult medical examination without abnormal findings: Secondary | ICD-10-CM | POA: Diagnosis not present

## 2016-06-13 DIAGNOSIS — M329 Systemic lupus erythematosus, unspecified: Secondary | ICD-10-CM

## 2016-06-13 DIAGNOSIS — N941 Unspecified dyspareunia: Secondary | ICD-10-CM

## 2016-06-13 DIAGNOSIS — I10 Essential (primary) hypertension: Secondary | ICD-10-CM

## 2016-06-13 LAB — BASIC METABOLIC PANEL WITH GFR
BUN: 15 mg/dL (ref 7–25)
CO2: 27 mmol/L (ref 20–31)
Calcium: 9.2 mg/dL (ref 8.6–10.4)
Chloride: 102 mmol/L (ref 98–110)
Creat: 0.78 mg/dL (ref 0.50–1.05)
GFR, Est African American: >89 mL/min (ref >=60)
GFR, Est Non African American: 87 mL/min (ref >=60)
Glucose, Bld: 101 mg/dL — ABNORMAL HIGH (ref 65–99)
Potassium: 3.5 mmol/L (ref 3.5–5.3)
Sodium: 136 mmol/L (ref 135–146)

## 2016-06-13 LAB — LIPID PANEL
Cholesterol: 154 mg/dL (ref <200)
HDL: 35 mg/dL — ABNORMAL LOW (ref >50)
LDL Cholesterol: 89 mg/dL (ref <100)
Total CHOL/HDL Ratio: 4.4 Ratio (ref <5.0)
Triglycerides: 150 mg/dL — ABNORMAL HIGH (ref <150)
VLDL: 30 mg/dL (ref <30)

## 2016-06-13 MED ORDER — HYDROCHLOROTHIAZIDE 25 MG PO TABS: 25.0000 mg | ORAL_TABLET | Freq: Every day | ORAL | 0 refills | Status: DC

## 2016-06-13 MED ORDER — LOSARTAN POTASSIUM 100 MG PO TABS: 100.0000 mg | ORAL_TABLET | Freq: Every day | ORAL | 1 refills | Status: DC

## 2016-06-13 MED ORDER — POTASSIUM CHLORIDE ER 10 MEQ PO TBCR: 10.0000 meq | EXTENDED_RELEASE_TABLET | Freq: Every day | ORAL | 0 refills | Status: DC

## 2016-06-13 MED ORDER — AMLODIPINE BESYLATE 5 MG PO TABS: 5.0000 mg | ORAL_TABLET | Freq: Every evening | ORAL | 11 refills | Status: DC

## 2016-06-13 MED ORDER — CARVEDILOL 6.25 MG PO TABS: 6.2500 mg | ORAL_TABLET | Freq: Two times a day (BID) | ORAL | 4 refills | Status: DC

## 2016-06-13 MED ORDER — ATORVASTATIN CALCIUM 10 MG PO TABS: 10.0000 mg | ORAL_TABLET | Freq: Every day | ORAL | 3 refills | Status: DC

## 2016-06-13 NOTE — Assessment & Plan Note (Addendum)
Almost at goal. BP 148/86. Patient notes that she thinks it is high today because she gets nervous when she comes a doctor.  - Has BP cuff at home, will check BP daily and will schedule an appointment next week if it is >140/90 - Continue amlodipine 5 mg daily, coreg 6.25 mg BID daily, HCTZ 25 mg daily, losartan 100 mg daily, KDUR 10 meq daily - Follow up in 3 months if BP normal at home - BMP today

## 2016-06-13 NOTE — Patient Instructions (Addendum)
Thank you for coming in today, it was so nice to see you! Today we talked about:    Health maintenance: Everything is up to date except your mammogram which I have given you information for. Please call them.   Please let me know if you need a referral for a gynecologist  Labs: We will check your blood work today  I have refilled your medications  Take your blood pressure every day, keep a log of your blood pressures. Her goal blood pressure is 140/90. The top number should be under 140 and the polyp should be under 90.  Please follow up in 1 month. You can schedule this appointment at the front desk before you leave or call the clinic.  Bring in all your medications or supplements to each appointment for review.   If we ordered any tests today, you will be notified via telephone of any abnormalities. If everything is normal you will get a letter in the mail.   If you have any questions or concerns, please do not hesitate to call the office at [phone]. You can also message me directly via MyChart.   Sincerely,  Anders Simmonds, MD

## 2016-06-14 LAB — CBC WITH DIFFERENTIAL/PLATELET
Basophils Absolute: 0 cells/uL (ref 0–200)
Basophils Relative: 0 %
Eosinophils Absolute: 108 cells/uL (ref 15–500)
Eosinophils Relative: 1 %
HCT: 39.3 % (ref 35.0–45.0)
Hemoglobin: 12.7 g/dL (ref 11.7–15.5)
Lymphocytes Relative: 21 %
Lymphs Abs: 2268 cells/uL (ref 850–3900)
MCH: 27.3 pg (ref 27.0–33.0)
MCHC: 32.3 g/dL (ref 32.0–36.0)
MCV: 84.3 fL (ref 80.0–100.0)
MPV: 8.8 fL (ref 7.5–12.5)
Monocytes Absolute: 648 cells/uL (ref 200–950)
Monocytes Relative: 6 %
Neutro Abs: 7776 cells/uL (ref 1500–7800)
Neutrophils Relative %: 72 %
Platelets: 351 10*3/uL (ref 140–400)
RBC: 4.66 MIL/uL (ref 3.80–5.10)
RDW: 14.1 % (ref 11.0–15.0)
WBC: 10.8 10*3/uL (ref 3.8–10.8)

## 2016-06-16 DIAGNOSIS — N941 Unspecified dyspareunia: Secondary | ICD-10-CM | POA: Insufficient documentation

## 2016-06-16 DIAGNOSIS — Z79899 Other long term (current) drug therapy: Secondary | ICD-10-CM | POA: Insufficient documentation

## 2016-06-16 NOTE — Assessment & Plan Note (Signed)
She was prescribed cymbalta in the past for headaches, patient notes she took herself off of this a couple months ago and has been feeling fine. She does not wish to continue cymbalta. Will take off medication list at this time.

## 2016-06-16 NOTE — Assessment & Plan Note (Signed)
Has not been taking Lipitor as prescribed. Calculated ASCVD risk for patient and it was 9.5%. Discussed what this meant for patient and the important of taking statin. Patient was agreeable to restart Lipitor - Refilled lipitor  - Lipid panel today

## 2016-06-16 NOTE — Assessment & Plan Note (Signed)
Health maintenance up to date besides mammogram. Gave patient information on where to obtain mammogram. Patient doing well overall (see other A/Ps).

## 2016-06-16 NOTE — Assessment & Plan Note (Addendum)
Is not on any medications for this. Is not currently endorsing any symptoms. Will continue to follow

## 2016-06-16 NOTE — Assessment & Plan Note (Addendum)
Possibly due to self reported hx of endometriosis. Denies any other symptoms. S/p hysterectomy in 2010. Patient would like to follow up with gynecologist. I do not think she needs a referral with her insurance but explained to her that if she finds out she does need a referral I will be happy to place one for her.

## 2016-06-18 ENCOUNTER — Encounter: Payer: Self-pay | Admitting: Family Medicine

## 2016-07-17 ENCOUNTER — Telehealth: Payer: Self-pay | Admitting: Family Medicine

## 2016-07-17 NOTE — Telephone Encounter (Signed)
[employer] form dropped off for at front desk for completion.  Verified that patient section of form has been completed.  Last DOS/WCC with PCP was 06/13/16.  Placed form in team folder to be completed by clinical staff.  Chari Manning

## 2016-07-17 NOTE — Telephone Encounter (Signed)
Reviewed, completed, and signed form.  Note routed to RN team inbasket and placed completed form in Clinic RN's office (wall pocket above desk).  Christain Mcraney M Ahlaya Ende, MD   

## 2016-07-17 NOTE — Telephone Encounter (Signed)
Clinical info completed on [employer] form.  Place form in Dr. Pennie Rushing box for completion.  Veronica [name], April D, [place]

## 2016-07-18 NOTE — Telephone Encounter (Signed)
LMOVM informing pt "the form you dropped off is ready for pickup".  Placed at front desk in filing bin. Emmaleigh Longo, Maryjo Rochester, CMA

## 2016-08-14 ENCOUNTER — Other Ambulatory Visit: Payer: Self-pay | Admitting: Family Medicine

## 2016-08-14 ENCOUNTER — Other Ambulatory Visit: Payer: Self-pay | Admitting: Internal Medicine

## 2016-08-14 DIAGNOSIS — R05 Cough: Secondary | ICD-10-CM

## 2016-08-14 DIAGNOSIS — I10 Essential (primary) hypertension: Secondary | ICD-10-CM

## 2016-08-14 DIAGNOSIS — R058 Other specified cough: Secondary | ICD-10-CM

## 2016-09-03 ENCOUNTER — Encounter: Payer: Self-pay | Admitting: Obstetrics and Gynecology

## 2016-09-03 ENCOUNTER — Ambulatory Visit (INDEPENDENT_AMBULATORY_CARE_PROVIDER_SITE_OTHER): Payer: BLUE CROSS/BLUE SHIELD | Admitting: Obstetrics and Gynecology

## 2016-09-03 VITALS — BP 110/80 | HR 66 | Temp 97.8°F | Wt 231.0 lb

## 2016-09-03 DIAGNOSIS — L03115 Cellulitis of right lower limb: Secondary | ICD-10-CM

## 2016-09-03 DIAGNOSIS — M7989 Other specified soft tissue disorders: Secondary | ICD-10-CM | POA: Diagnosis not present

## 2016-09-03 MED ORDER — CEPHALEXIN 500 MG PO CAPS: 500.0000 mg | ORAL_CAPSULE | Freq: Four times a day (QID) | ORAL | 0 refills | Status: AC

## 2016-09-03 MED ORDER — FLUCONAZOLE 150 MG PO TABS: 150.0000 mg | ORAL_TABLET | Freq: Once | ORAL | 0 refills | Status: AC

## 2016-09-03 NOTE — Progress Notes (Signed)
Subjective:   Patient ID: Veronica [name], female    DOB: [birth year], 56 y.o.   MRN: [id number]  Patient presents for Same Day Appointment  Chief Complaint  Patient presents with  . Edema    HPI: # LEG SWELLING feet swelling only; does not go up leg New for patient Mouth dry  Also associated discomfort over the weekend Was unable to walk  Having swelling for a couple days New medications: no History of Kidney problems: no History of Heart problems: no History of Liver problems: no History of cancer: no  Symptoms Chest pain: no Shortness of breath: no Immobility: no History of blood clots: no   Review of Systems   See HPI for ROS.   History  Smoking Status  . Never Smoker  Smokeless Tobacco  . Never Used    Past medical history, surgical, family, and social history reviewed and updated in the EMR as appropriate.  Pertinent Historical Findings include: SLE, HTN Objective:  BP 110/80   Pulse 66   Temp 97.8 F (36.6 C) (Oral)   Wt 231 lb (104.8 kg)   SpO2 96%   BMI 39.65 kg/m  Vitals and nursing note reviewed  Physical Exam General: NAD, alert, cooperative with exam, well-appearing Respiratory/Chest: CTABL, no wheezes, non-labored Cardiovascular: RRR, good S1/S2, no murmur, +1 pitting edema to ankles, capillary refill brisk  Skin: right foot with some increased redness and warmth on top of foot  Assessment & Plan:  1. Leg swelling More like bilateral feet swelling. Overall swelling appears benign. No definitive cause appreciated currently. patient does not have any heart disease. Blood work reviewed from 2 months ago was benign. Could be associated with her amlodipine use. Would recommend conservative treatment initially to include elevation and compression. If this does not help with swelling patient encouraged to follow PCP to discuss discontinuation of amlodipine and see if that is the cause of her swelling.  2. Cellulitis of right lower  extremity Concern for cellulitis. Right foot is warm and red. Will treat with Keflex. Discussed signs of worsening or spreading infection with patient. Return precautions discussed. Warning signs given. Also given Rx for fluconazole to take after she completes antibiotics that she gets yeast infections from antibiotics.   Meds ordered this encounter  Medications  . cephALEXin (KEFLEX) 500 MG capsule    Sig: Take 1 capsule (500 mg total) by mouth 4 (four) times daily.    Dispense:  20 capsule    Refill:  0  . fluconazole (DIFLUCAN) 150 MG tablet    Sig: Take 1 tablet (150 mg total) by mouth once.    Dispense:  1 tablet    Refill:  0     Diagnosis and plan along with any newly prescribed medication(s) were discussed in detail with this patient today. The patient verbalized understanding and agreed with the plan. Patient advised if symptoms worsen return to clinic or ER.   PATIENT EDUCATION PROVIDED: See AVS   Caryl Ada, DO 09/03/2016, 10:07 AM PGY-3, [facility]

## 2016-09-03 NOTE — Patient Instructions (Signed)
Cellulitis, Adult Cellulitis is a skin infection. The infected area is usually red and sore. This condition occurs most often in the arms and lower legs. It is very important to get treated for this condition. Follow these instructions at home:  Take over-the-counter and prescription medicines only as told by your doctor.  If you were prescribed an antibiotic medicine, take it as told by your doctor. Do not stop taking the antibiotic even if you start to feel better.  Drink enough fluid to keep your pee (urine) clear or pale yellow.  Do not touch or rub the infected area.  Raise (elevate) the infected area above the level of your heart while you are sitting or lying down.  Place warm or cold wet cloths (warm or cold compresses) on the infected area. Do this as told by your doctor.  Keep all follow-up visits as told by your doctor. This is important. These visits let your doctor make sure your infection is not getting worse. Contact a doctor if:  You have a fever.  Your symptoms do not get better after 1-2 days of treatment.  Your bone or joint under the infected area starts to hurt after the skin has healed.  Your infection comes back. This can happen in the same area or another area.  You have a swollen bump in the infected area.  You have new symptoms.  You feel ill and also have muscle aches and pains. Get help right away if:  Your symptoms get worse.  You feel very sleepy.  You throw up (vomit) or have watery poop (diarrhea) for a long time.  There are red streaks coming from the infected area.  Your red area gets larger.  Your red area turns darker. This information is not intended to replace advice given to you by your health care provider. Make sure you discuss any questions you have with your health care provider. Document Released: 10/03/2007 Document Revised: 09/22/2015 Document Reviewed: 02/23/2015 Elsevier Interactive Patient Education  2017 [employer]  [employer]  

## 2016-09-07 IMAGING — CR DG CHEST 2V
2 series · 2 of 2 positions shown · non-contrast
Comparison: 07/16/2012

CLINICAL DATA: Productive cough 4 days

EXAM:
CHEST  2 VIEW

[view not recorded (1 of 2)]
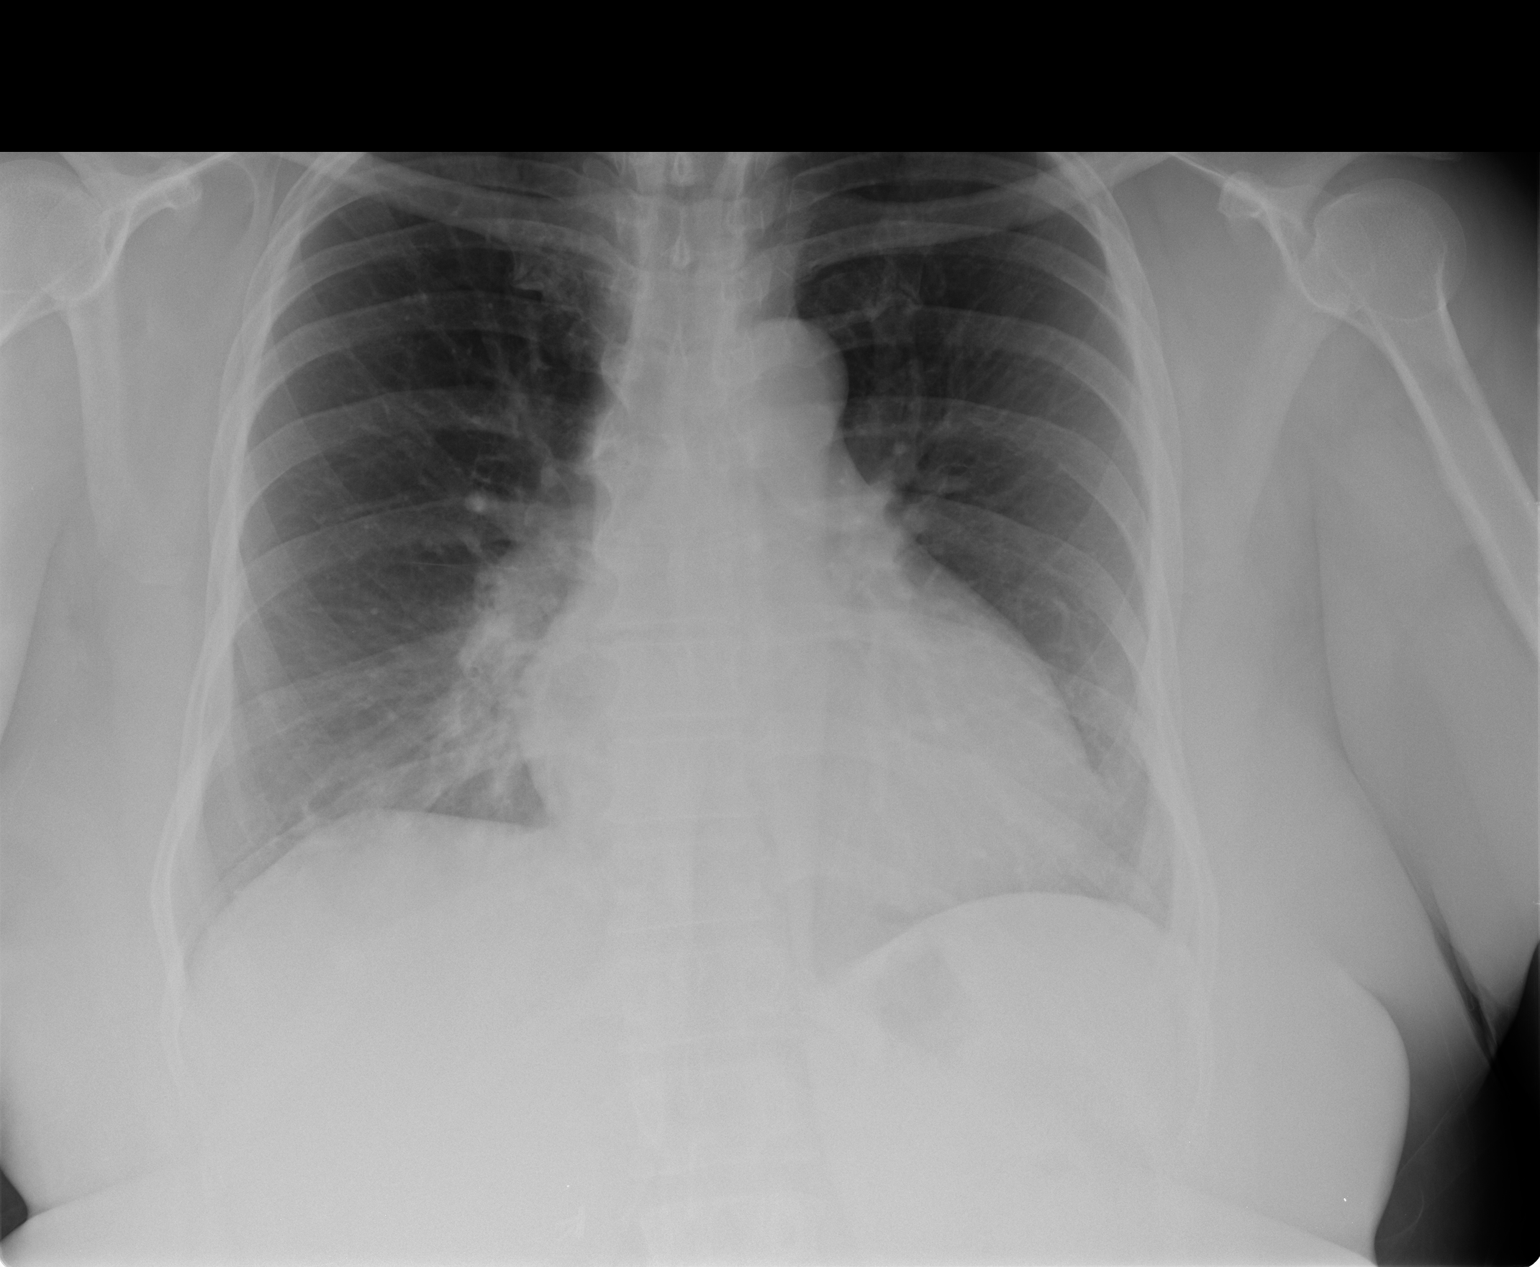

[view not recorded (2 of 2)]
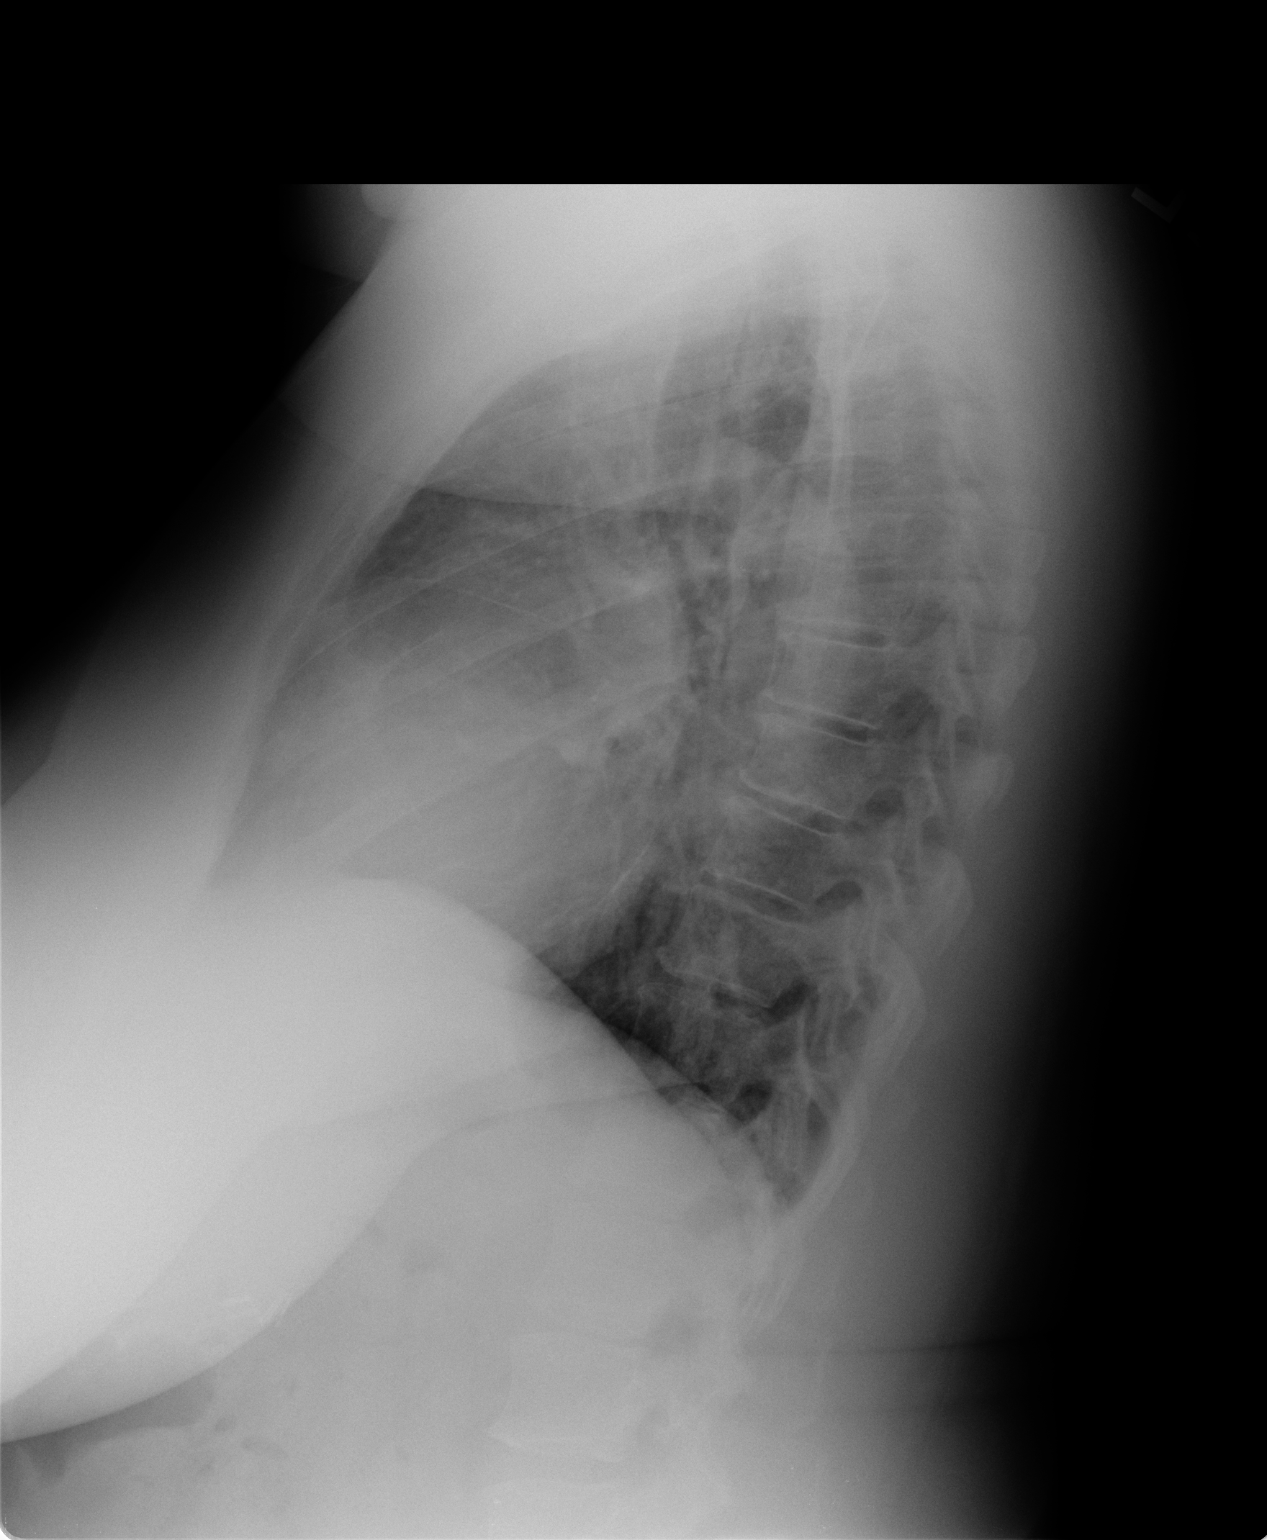

[2 of 2 positions shown; findings below may reference images not displayed]

FINDINGS: Cardiac enlargement with normal pulmonary vascularity. Possible
right infrahilar infiltrate on the AP view not confirmed on the
lateral view but appearing to represent interval change from the
prior study. No effusion. Left lung clear.
IMPRESSION: Possible right middle or  lower lobe pneumonia.

## 2016-09-12 ENCOUNTER — Other Ambulatory Visit: Payer: Self-pay | Admitting: Family Medicine

## 2016-10-03 ENCOUNTER — Telehealth: Payer: Self-pay | Admitting: Family Medicine

## 2016-10-03 NOTE — Telephone Encounter (Signed)
Pre-visit call. Patient confirmed apt for 10/04/16. Patient was advised to arrive early for check-in and to bring all current medications.  °

## 2016-10-04 ENCOUNTER — Ambulatory Visit: Payer: BLUE CROSS/BLUE SHIELD | Admitting: Family Medicine

## 2016-10-11 ENCOUNTER — Telehealth: Payer: Self-pay | Admitting: Family Medicine

## 2016-10-11 NOTE — Telephone Encounter (Signed)
Pre-visit call. No answer, LMOVM. Patient was advised to arrive early for check-in and to bring all current medication.  °

## 2016-10-12 ENCOUNTER — Encounter: Payer: Self-pay | Admitting: Family Medicine

## 2016-10-12 ENCOUNTER — Ambulatory Visit (INDEPENDENT_AMBULATORY_CARE_PROVIDER_SITE_OTHER): Payer: BLUE CROSS/BLUE SHIELD | Admitting: Family Medicine

## 2016-10-12 VITALS — BP 119/80 | HR 70 | Temp 98.0°F | Ht 64.0 in | Wt 233.6 lb

## 2016-10-12 DIAGNOSIS — R233 Spontaneous ecchymoses: Secondary | ICD-10-CM

## 2016-10-12 DIAGNOSIS — M329 Systemic lupus erythematosus, unspecified: Secondary | ICD-10-CM

## 2016-10-12 LAB — POCT SEDIMENTATION RATE: POCT SED RATE: 26 mm/hr — AB (ref 0–22)

## 2016-10-12 MED ORDER — TRIAMCINOLONE ACETONIDE 0.1 % EX CREA: 1.0000 "application " | TOPICAL_CREAM | Freq: Two times a day (BID) | CUTANEOUS | 0 refills | Status: DC

## 2016-10-12 NOTE — Progress Notes (Signed)
Subjective:    Patient ID: Veronica [name] , female   DOB: 1960/05/30 , 56 y.o..   MRN: [id number]  HPI  Veronica [name] is here for   1. Leg swelling and rash follow up: feet swelling only; does not go up leg. New for patient since May 2018. Was seen by Dr. Doroteo Glassman in May 2018 and was diagnosed as cellulitis and given antibiotics. Patient notes that her rash cleared up after that. She has continued to have some intermittent swelling. 2 days ago the rash reappeared on her bilateral lower extremities.  Previous had mild discomfort with ambulation but not now. New medications: no History of Kidney problems: no History of Heart problems: no History of Liver problems: no History of cancer: no Of note, patient states that she does have a history of SLE and she was diagnosed in 2008. She is not currently on any medications. She is trying to see a rheumatologist but they have told her that she needs a referral.  Symptoms Chest pain: no Shortness of breath: no Immobility: no History of blood clots: no  Review of Systems: Per HPI.   Past Medical History: Patient Active Problem List   Diagnosis Date Noted  . Petechial rash 10/12/2016  . Medication management 06/16/2016  . Dyspareunia in female 06/16/2016  . Hydradenitis 11/28/2015  . HLD (hyperlipidemia) 06/14/2015  . Morbid obesity (HCC) 06/14/2015  . Encounter for annual physical exam 02/21/2015  . Leg swelling 10/10/2014  . Insomnia 12/15/2012  . Essential hypertension, benign 04/10/2012  . SLE (systemic lupus erythematosus) (HCC) 04/10/2012   Medications: reviewed   Social Hx:  reports that she has never smoked. She has never used smokeless tobacco.   Objective:   BP 119/80 (BP Location: Left Arm, Patient Position: Sitting, Cuff Size: Large)   Pulse 70   Temp 98 F (36.7 C) (Oral)   Ht 5\' 4"  (1.626 m)   Wt 233 lb 9.6 oz (106 kg)   SpO2 97%   BMI 40.10 kg/m  Physical Exam  Gen: NAD, alert, cooperative with exam,  well-appearing HEENT: NCAT, moist mucous membranes  Cardiac: Regular rate and rhythm, capillary refill brisk , 2+ DP pulses bilaterally Respiratory: Clear to auscultation bilaterally, no wheezes, non-labored breathing Skin: Petechial non blanchable rash on bilateral lower extremities from mid calf to ankles, trace edema bilateral ankles Psych: good insight, normal mood and affect  Assessment & Plan:  Petechial rash Associated with intermittent pedal edema. Rash located on bilateral lower extremities from mid calf to ankle. No signs of infection. This could possibly be from a vasculitis versus capillary damage from edematous tissue. Patient risk for vasculitis given history of SLE. Not currently on any rheumatologic treatment. Is trying to see a rheumatologist but was told that she needs referral. -We'll collect CBC, ANA, ESR, rheumatoid factor, C3 and C4 complement -Discussed compression stockings -Topical steroid treatment with triamcinolone ointment -Will stop Amlodipine as this may be worsening edema -Referral to rheumatology placed   Orders Placed This Encounter  Procedures  . CBC with Differential  . ANA  . Rheumatoid factor  . C3 and C4  . Ambulatory referral to Rheumatology    Referral Priority:   Routine    Referral Type:   Consultation    Referral Reason:   Specialty Services Required    Requested Specialty:   Rheumatology    Number of Visits Requested:   1  . POCT SEDIMENTATION RATE   Meds ordered this encounter  Medications  .  triamcinolone cream (KENALOG) 0.1 %    Sig: Apply 1 application topically 2 (two) times daily.    Dispense:  30 g    Refill:  0    Anders Simmonds, MD [facility], PGY-2

## 2016-10-12 NOTE — Assessment & Plan Note (Addendum)
Associated with intermittent pedal edema. Rash located on bilateral lower extremities from mid calf to ankle. No signs of infection. This could possibly be from a vasculitis versus capillary damage from edematous tissue. Patient risk for vasculitis given history of SLE. Not currently on any rheumatologic treatment. Is trying to see a rheumatologist but was told that she needs referral. -We'll collect CBC, ANA, ESR, rheumatoid factor, C3 and C4 complement -Discussed compression stockings -Topical steroid treatment with triamcinolone ointment -Will stop Amlodipine as this may be worsening edema -Referral to rheumatology placed

## 2016-10-12 NOTE — Patient Instructions (Signed)
Thank you for coming in today, it was so nice to see you! Today we talked about:    Rash: This is likely coming from some damage from leg swelling. These use the compression stockings like we discussed. You can apply the triamcinolone steroid ointment twice a day until the rash goes away.  I have referred you to rheumatology, someone should call you to schedule this appointment  Please come back to the clinic if the rash gets worse  Stop taking the amlodipine as this could be causing some swelling  Please follow up in 3 months. You can schedule this appointment at the front desk before you leave or call the clinic.  Bring in all your medications or supplements to each appointment for review.   If we ordered any tests today, you will be notified via telephone of any abnormalities. If everything is normal you will get a letter in the mail.   If you have any questions or concerns, please do not hesitate to call the office at [phone]. You can also message me directly via MyChart.   Sincerely,  Anders Simmonds, MD

## 2016-10-16 LAB — CBC WITH DIFFERENTIAL/PLATELET
Basophils Absolute: 0 10*3/uL (ref 0.0–0.2)
Basos: 0 %
EOS (ABSOLUTE): 0.1 10*3/uL (ref 0.0–0.4)
Eos: 2 %
Hematocrit: 36.4 % (ref 34.0–46.6)
Hemoglobin: 11.6 g/dL (ref 11.1–15.9)
Immature Grans (Abs): 0 10*3/uL (ref 0.0–0.1)
Immature Granulocytes: 0 %
Lymphocytes Absolute: 1.8 10*3/uL (ref 0.7–3.1)
Lymphs: 36 %
MCH: 26.5 pg — ABNORMAL LOW (ref 26.6–33.0)
MCHC: 31.9 g/dL (ref 31.5–35.7)
MCV: 83 fL (ref 79–97)
Monocytes Absolute: 0.3 10*3/uL (ref 0.1–0.9)
Monocytes: 7 %
Neutrophils Absolute: 2.7 10*3/uL (ref 1.4–7.0)
Neutrophils: 55 %
Platelets: 318 10*3/uL (ref 150–379)
RBC: 4.37 x10E6/uL (ref 3.77–5.28)
RDW: 13.8 % (ref 12.3–15.4)
WBC: 4.9 10*3/uL (ref 3.4–10.8)

## 2016-10-16 LAB — ANA: Anti Nuclear Antibody(ANA): POSITIVE — AB

## 2016-10-16 LAB — C3 AND C4
Complement C3, Serum: 126 mg/dL (ref 82–167)
Complement C4, Serum: 20 mg/dL (ref 14–44)

## 2016-10-16 LAB — RHEUMATOID FACTOR: Rheumatoid fact SerPl-aCnc: <10 [IU]/mL (ref 0.0–13.9)

## 2016-10-18 ENCOUNTER — Other Ambulatory Visit: Payer: Self-pay | Admitting: Family Medicine

## 2016-10-18 DIAGNOSIS — R058 Other specified cough: Secondary | ICD-10-CM

## 2016-10-18 DIAGNOSIS — R05 Cough: Secondary | ICD-10-CM

## 2016-10-21 ENCOUNTER — Encounter: Payer: Self-pay | Admitting: Family Medicine

## 2016-11-07 ENCOUNTER — Encounter: Payer: Self-pay | Admitting: Family Medicine

## 2016-11-14 ENCOUNTER — Other Ambulatory Visit: Payer: Self-pay | Admitting: Family Medicine

## 2016-11-14 DIAGNOSIS — I10 Essential (primary) hypertension: Secondary | ICD-10-CM

## 2016-11-27 ENCOUNTER — Ambulatory Visit (INDEPENDENT_AMBULATORY_CARE_PROVIDER_SITE_OTHER): Payer: BLUE CROSS/BLUE SHIELD | Admitting: Student in an Organized Health Care Education/Training Program

## 2016-11-27 ENCOUNTER — Encounter: Payer: Self-pay | Admitting: Student in an Organized Health Care Education/Training Program

## 2016-11-27 VITALS — BP 135/90 | HR 70 | Temp 98.3°F | Ht 64.0 in | Wt 234.4 lb

## 2016-11-27 DIAGNOSIS — I10 Essential (primary) hypertension: Secondary | ICD-10-CM | POA: Diagnosis not present

## 2016-11-27 DIAGNOSIS — M7989 Other specified soft tissue disorders: Secondary | ICD-10-CM | POA: Diagnosis not present

## 2016-11-27 MED ORDER — CARVEDILOL 12.5 MG PO TABS: 12.5000 mg | ORAL_TABLET | Freq: Two times a day (BID) | ORAL | 3 refills | Status: DC

## 2016-11-27 NOTE — Patient Instructions (Signed)
It was a pleasure seeing you today in our clinic. Today we discussed your blood pressure. Here is the treatment plan we have discussed and agreed upon together:  - STOP amlodipine - START coreg 12.5 mg tablets twice daily - continue your other medications including HCTZ and losartan  On your way out today, please schedule a nurse visit for a blood pressure check in one week. Also please schedule a blood pressure follow up appointment with your primary physician Dr. Jonathon Jordan in 2 weeks.  Our clinic's number is [phone]. Please call with questions or concerns about what we discussed today.  Be well, Dr. Mosetta Putt

## 2016-11-27 NOTE — Progress Notes (Signed)
   CC: BP follow up  HPI: Veronica [name] is a 56 y.o. female with PMH significant for HTN, HLD, hx of SLE presents to clinic for BP follow up today.   Patient was in her usual state of health 2 months ago when she was noted to have bilateral lower extremity swelling thought to be due to amlodipine. Patient's pressure was controlled and so amlodipine was discontinued at that office visit. After discontinuing the Norvasc her lower extremity swelling improved, however she noted elevated blood pressures to 201/106 tested at the pharmacy. This is noted last Saturday, and so she restarted her amlodipine. She presents today for blood pressure follow-up.  - Patient reports 100% compliance with losartan 100 mg daily, HCTZ 25 mg daily, Coreg 6.125 mg BID - restart amlodipine 5 mg daily about one week ago, has not seen LE swelling return - no CP or palpitations, no blurry vision - does endorse headaches intermittently, consistent with previous headaches she has gotten with high BP - checks BPs at outpt pharmacy as noted above  Review of Symptoms:  See HPI for ROS.   CC, SH/smoking status, and VS noted.  Objective: BP 135/90   Pulse 70   Temp 98.3 F (36.8 C) (Oral)   Ht 5\' 4"  (1.626 m)   Wt 234 lb 6.4 oz (106.3 kg)   SpO2 99%   BMI 40.23 kg/m   Repeat BP 135/90 GEN: NAD, alert, cooperative, and pleasant. EYE: no conjunctival injection, pupils equally round and reactive to light RESPIRATORY: clear to auscultation bilaterally with no wheezes, rhonchi or rales, good effort CV: RRR, no m/r/g, no peripheral edema SKIN: warm and dry, no rashes or lesions NEURO: II-XII grossly intact, normal gait, peripheral sensation intact PSYCH: AAOx3, appropriate affect  Assessment and plan:  Leg swelling - resolved with DC amlodipine at last visit  Essential hypertension, benign - DC amlodipine since this caused swelling previously - increased coreg to 12.5 mg BID - follow up 1 week BP check in  nurse clinic - follow up 2 weeks with PCP for BP check - better control with repeat BP check, however given that she is on 3 agents with hx SLE per chart review, will order BMP to check Cr is stable - given patient is requiring 3 agents for BP control, can consider workup for secondary hypertension, such as TSH, sleep study to r/o OSA (BMP 40) - can consider sending to pharmacy clinic for ambulatory HTN monitoring   Orders Placed This Encounter  Procedures  . Basic Metabolic Panel    Meds ordered this encounter  Medications  . carvedilol (COREG) 12.5 MG tablet    Sig: Take 1 tablet (12.5 mg total) by mouth 2 (two) times daily with a meal.    Dispense:  60 tablet    Refill:  3    , MD,MS,  PGY2 11/27/2016 11:06 AM

## 2016-11-27 NOTE — Assessment & Plan Note (Signed)
-   resolved with DC amlodipine at last visit

## 2016-11-27 NOTE — Assessment & Plan Note (Signed)
-   DC amlodipine since this caused swelling previously - increased coreg to 12.5 mg BID - follow up 1 week BP check in nurse clinic - follow up 2 weeks with PCP for BP check - better control with repeat BP check, however given that she is on 3 agents with hx SLE per chart review, will order BMP to check Cr is stable - given patient is requiring 3 agents for BP control, can consider workup for secondary hypertension, such as TSH, sleep study to r/o OSA (BMP 40) - can consider sending to pharmacy clinic for ambulatory HTN monitoring

## 2016-11-28 ENCOUNTER — Telehealth: Payer: Self-pay | Admitting: Student in an Organized Health Care Education/Training Program

## 2016-11-28 LAB — BASIC METABOLIC PANEL
BUN/Creatinine Ratio: 14 (ref 9–23)
BUN: 12 mg/dL (ref 6–24)
CO2: 25 mmol/L (ref 20–29)
Calcium: 9.7 mg/dL (ref 8.7–10.2)
Chloride: 101 mmol/L (ref 96–106)
Creatinine, Ser: 0.83 mg/dL (ref 0.57–1.00)
GFR calc Af Amer: 93 mL/min/{1.73_m2} (ref >59)
GFR calc non Af Amer: 81 mL/min/{1.73_m2} (ref >59)
Glucose: 84 mg/dL (ref 65–99)
Potassium: 3.9 mmol/L (ref 3.5–5.2)
Sodium: 140 mmol/L (ref 134–144)

## 2016-11-28 NOTE — Telephone Encounter (Signed)
Called to inform patient BMP from office visit yesterday was WNL. She voiced understanding. Howard Pouch, MD PGY-2 [facility]

## 2016-12-05 ENCOUNTER — Ambulatory Visit (INDEPENDENT_AMBULATORY_CARE_PROVIDER_SITE_OTHER): Payer: BLUE CROSS/BLUE SHIELD | Admitting: *Deleted

## 2016-12-05 ENCOUNTER — Ambulatory Visit (INDEPENDENT_AMBULATORY_CARE_PROVIDER_SITE_OTHER): Payer: BLUE CROSS/BLUE SHIELD | Admitting: Family Medicine

## 2016-12-05 ENCOUNTER — Encounter: Payer: Self-pay | Admitting: Family Medicine

## 2016-12-05 VITALS — BP 156/90 | HR 61 | Wt 235.0 lb

## 2016-12-05 VITALS — BP 164/98 | HR 57 | Temp 98.4°F | Ht 65.0 in | Wt 235.8 lb

## 2016-12-05 DIAGNOSIS — I10 Essential (primary) hypertension: Secondary | ICD-10-CM

## 2016-12-05 NOTE — Patient Instructions (Signed)
It was great to meet you today! Thank you for letting me participate in your care!  Today, we discussed your high blood pressure. Please restart your Amlodipine at 5mg  every day and check your blood pressure once a day when you have been at rest for at least 15 minutes.  If you have leg swelling or symptoms of low blood pressure like dizziness, or feeling like you may faint please stop taking the Amlodipine.  Please keep your appointment for next week.  Be well, , DO PGY-1, [facility]

## 2016-12-05 NOTE — Assessment & Plan Note (Signed)
Differential for high blood pressure includes recent increase in stress vs secondary hypertension due to untreated thyroid disease vs OSA Restarted Amlodipine 5mg  for this week for better BP control for patient since it was high when she came in today and she has this at home.   Patient will return for her regular appointment next week and recheck BP at that time. Patient knows to stop amlodipine if it makes her legs swell again. Consider adding a different 4th line BP med at next visit if BP not under control.

## 2016-12-05 NOTE — Progress Notes (Signed)
   Patient in nurse clinic for blood pressure check.  Patient reported headache x 3 weeks, blurred vision and dizziness started today. Patient denies chest pain.  Patient has a new client that is living with her.  Patient is taking all medications as prescribed.  Patient will see Dr. Karen Chafe this AM for current symptoms.  Clovis Pu, RN   Today's Vitals   12/05/16 0940 12/05/16 0948  BP: (!) 180/100 (!) 164/98  Pulse: (!) 56 (!) 57  SpO2: 98% 99%  PainSc: 0-No pain

## 2016-12-05 NOTE — Progress Notes (Signed)
     Subjective: Chief Complaint  Patient presents with  . Hypertension     HPI: Veronica [name] is a 56 y.o. presenting to clinic today to discuss the following:  1 High blood pressure on nursing visit  Veronica [name] came in today for a BP check and it was found to be elevated at over 160 systolic on the first check and over 180 on the second check. After sitting for some time it went back down to 156/90.  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 It is causing her a lot of stress. She had an episode of blurry vision this morning that resolved within seconds. She is having headaches that she attributes to her BP being high and states she has had these for years and it is unchanged. She denies any loss of consciousness, or near fainting, leg swelling, sob, cough, chest pain, palpitations, nausea, vomiting, or abdominal pain.  She endorses intermittent lightheadedness if she stands up too fast.   Health Maintenance: none today     ROS noted in HPI.   Past Medical, Surgical, Social, and Family History Reviewed & Updated per EMR.   Pertinent Historical Findings include:   History  Smoking Status  . Never Smoker  Smokeless Tobacco  . Never Used      Objective: BP (!) 156/90   Pulse 61   Wt 235 lb (106.6 kg)   SpO2 99%   BMI 39.11 kg/m  Vitals and nursing notes reviewed  Physical Exam   Gen: Alert and Oriented x 3, NAD HEENT: Normocephalic, atraumatic, PERRLA, EOMI Neck: No carotid bruits Resp: CTAB, no wheezing, rales, or rhonchi, comfortable work of breathing CV: RRR, no murmurs, normal S1, S2 split, +2 pulses dorsalis pedis bilaterally Abd: non-distended, non-tender, soft, +bs in all four quadrants, no hepatosplenomegaly MSK: FROM in all four extremities    No results found for this or any previous visit (from the past 72 hour(s)).  Assessment/Plan:  Essential hypertension,  benign Differential for high blood pressure includes recent increase in stress vs secondary hypertension due to untreated thyroid disease vs OSA Restarted Amlodipine 5mg  for this week for better BP control for patient since it was high when she came in today and she has this at home.   Patient will return for her regular appointment next week and recheck BP at that time. Patient knows to stop amlodipine if it makes her legs swell again. Consider adding a different 4th line BP med at next visit if BP not under control.    Please see problem based Assessment and Plan PATIENT EDUCATION PROVIDED: See AVS    Diagnosis and plan along with any newly prescribed medication(s) were discussed in detail with this patient today. The patient verbalized understanding and agreed with the plan. Patient advised if symptoms worsen return to clinic or ER.   Health Maintainance:   No orders of the defined types were placed in this encounter.   No orders of the defined types were placed in this encounter.    , DO 12/05/2016, 9:52 PM PGY-1, [facility]

## 2016-12-11 ENCOUNTER — Ambulatory Visit (INDEPENDENT_AMBULATORY_CARE_PROVIDER_SITE_OTHER): Payer: BLUE CROSS/BLUE SHIELD | Admitting: Student

## 2016-12-11 ENCOUNTER — Encounter: Payer: Self-pay | Admitting: Student

## 2016-12-11 VITALS — BP 138/78 | HR 67 | Temp 98.1°F | Ht 65.0 in | Wt 235.6 lb

## 2016-12-11 DIAGNOSIS — I10 Essential (primary) hypertension: Secondary | ICD-10-CM

## 2016-12-11 DIAGNOSIS — R0683 Snoring: Secondary | ICD-10-CM

## 2016-12-11 DIAGNOSIS — R5383 Other fatigue: Secondary | ICD-10-CM

## 2016-12-11 NOTE — Patient Instructions (Signed)
It was great seeing you today! We have addressed the following issues today 1. Blood pressure: Your blood pressure is 130/70 today. Your goal blood pressure is less than 130/80. Continue checking your blood pressure at home as we discussed and keep blood pressure log. We have also ordered a referral to sleep clinic to rule out sleep apnea. I recommended follow-up in 1 month or sooner if needed. I also suggest lifestyle change including diet and exercise as below.  If we did any lab work today, and the results require attention, either me or my nurse will get in touch with you. If everything is normal, you will get a letter in mail and a message via . If you don't hear from [place] in two weeks, please give [place] a call. Otherwise, we look forward to seeing you again at your next visit. If you have any questions or concerns before then, please call the clinic at [phone].  Please bring all your medications to every doctors visit  Sign up for My Chart to have easy access to your labs results, and communication with your Primary care physician.    Please check-out at the front desk before leaving the clinic.    Take Care,   Dr. Alanda Slim  Portion Size    Choose healthier foods such as 100% whole grains, vegetables, fruits, beans, nut seeds, olive oil, most vegetable oils, fat-free dietary, wild game and fish.   Avoid sweet tea, other sweetened beverages, soda, fruit juice, cold cereal and milk and trans fat.   Eat at least 3 meals and 1-2 snacks per day.  Aim for no more than 5 hours between eating.  Eat breakfast within one hour of getting up.    Exercise at least 150 minutes per week, including weight resistance exercises 3 or 4 times per week.   Try to lose at least 7-10% of your current body weight.

## 2016-12-11 NOTE — Progress Notes (Signed)
Subjective:    Veronica [name] is a 56 y.o. old female here for follow up on her blood pressure  HPI Hypertension: she was here about a week ago. She was started on amlodipine 5 mg daily in addition to her other blood pressure medications. She is currently on amlodipine 5 mg, carvedilol 12.5 mg twice a day, HCTZ 25 mg and losartan 100 mg daily.  She reports good compliance with her medications. She checks her BP at home. Last BP check was 4 days ago and it was 172/104. Reports frequent headache for the last one month. Headache 2/10. Takes tylenol and ibuprofen that helps. Denies numbness, tingling or focal weakness. Denies chest pain, shortness of breath or edema. Patient reports snoring at night and day time sleepiness and fatigue. Patient had sleep study in 2012 with AHI of 2.5 but RDI of 7.6. The later suggests mild OSA.   PMH/Problem List: has Essential hypertension, benign; SLE (systemic lupus erythematosus) (HCC); Insomnia; Leg swelling; Encounter for annual physical exam; HLD (hyperlipidemia); Morbid obesity (HCC); Hydradenitis; Medication management; Dyspareunia in female; and Petechial rash on her problem list.   has a past medical history of Daily headache; Fibromyalgia; History of blood transfusion (2010); dizziness; Hypertension; Pneumonia (06/2014; 07/2014); Rheumatoid arthritis (HCC); and SLE (systemic lupus erythematosus) (HCC) (1998).  FH:  Family History  Problem Relation Age of Onset  . Diabetes Mother   . Hypertension Father   . Multiple sclerosis Sister   . Hypertension Brother     [facility] Social History  Substance Use Topics  . Smoking status: Never Smoker  . Smokeless tobacco: Never Used  . Alcohol use No    Review of Systems Review of systems negative except for pertinent positives and negatives in history of present illness above.     Objective:     Vitals:   12/11/16 1400  BP: 138/78  Pulse: 67  Temp: 98.1 F (36.7 C)  TempSrc: Oral  SpO2: 97%  Weight: 235 lb 9.6  oz (106.9 kg)  Height: 5\' 5"  (1.651 m)  Body mass index is 39.21 kg/m.   Physical Exam GEN: appears well, no apparent distress. Head: normocephalic and atraumatic  Eyes: conjunctiva without injection, sclera anicteric Oropharynx: mmm without erythema or exudation ENDO: negative thyromegally CVS: RRR, nl S1&S2, no murmurs, no edema RESP: no IWOB, good air movement bilaterally, CTAB MSK: no focal tenderness or notable swelling SKIN: no apparent skin lesion NEURO: alert and oiented appropriately, no gross deficits  PSYCH: euthymic mood with congruent affect    Assessment and Plan:  Essential hypertension, benign Her blood pressure here is within normal limit. Repeat blood pressure in clinic was 130/70. She is already on multiple medication and maxed on two of them. She is at risk for OSA with STOP-BANG score of 5-6. Her sleep study from 6 years ago suggestive for mild OSA with RDI score of 7.6. There is also discrepancy between her home BP and office BP.  -Continue medications. Suggest stopping her carvedilol and maximizing her other BP meds unless there is an indication for BB. BB increases mortality for HTN unless there is another indication. However, I defer this to PCP. -She may benefit from ambulatory BP monitoring -Referral to sleep clinic given her high risk for OSA -TSH and free T4 -Follow up with PCP in a month.  -Discussed about appropriate blood pressure measurement and recommended keeping BP log. Advised her to bring her BP machine to that visit.   Orders Placed This Encounter  Procedures  .  TSH  . T4, Free  . Ambulatory referral to Sleep Studies   Return in about 1 month (around 01/11/2017) for follow up on BP.  Almon Hercules, MD 12/12/16 Pager: [phone]

## 2016-12-12 LAB — T4, FREE: Free T4: 1.1 ng/dL (ref 0.82–1.77)

## 2016-12-12 LAB — TSH: TSH: 1.24 u[IU]/mL (ref 0.450–4.500)

## 2016-12-12 NOTE — Assessment & Plan Note (Addendum)
Her blood pressure is within normal limit here. Repeat blood pressure in clinic was 130/70. She is already on multiple medication and maxed on two of them. She is at risk for OSA with STOP-BANG score of 5-6. Her sleep study from 6 years ago suggestive for mild OSA with RDI score of 7.6. There is also discrepancy between her home BP and office BP.  -Continue medications. Suggest stopping her carvedilol and maximizing her other BP meds unless there is an indication for BB. BB increases mortality for HTN unless there is another indication. However, I defer this to PCP. -She may benefit from ambulatory BP monitoring -Referral to sleep clinic given her high risk for OSA -TSH and free T4 -Follow up with PCP in a month.  -Discussed about appropriate blood pressure measurement and recommended keeping BP log. Advised her to bring her BP machine to that visit.

## 2016-12-17 ENCOUNTER — Other Ambulatory Visit: Payer: Self-pay | Admitting: *Deleted

## 2016-12-17 DIAGNOSIS — I10 Essential (primary) hypertension: Secondary | ICD-10-CM

## 2016-12-17 MED ORDER — AMLODIPINE BESYLATE 5 MG PO TABS: 5.0000 mg | ORAL_TABLET | Freq: Every evening | ORAL | 11 refills | Status: DC

## 2017-01-02 ENCOUNTER — Other Ambulatory Visit: Payer: Self-pay | Admitting: Family Medicine

## 2017-01-02 DIAGNOSIS — Z1231 Encounter for screening mammogram for malignant neoplasm of breast: Secondary | ICD-10-CM

## 2017-01-07 ENCOUNTER — Ambulatory Visit: Payer: BLUE CROSS/BLUE SHIELD | Admitting: Family Medicine

## 2017-01-08 NOTE — Progress Notes (Addendum)
Subjective:    Patient ID: Veronica [name] , female   DOB: [birth year] , 56 y.o..   MRN: [id number]  HPI  Veronica [name] is a 56 yo female with PMH of SLE, hypertension, hyperlipidemia, morbid obesity  here for  Chief Complaint  Patient presents with  . Chest Pain   1. Hypertension Blood pressure at home: Has been high at home, up to 170's systolicaly but does not check consistently  Exercise: Walking 3 times a week Low salt diet: Trying to cut back  Medications: Compliant with Carvedilol, HCTZ, and Cozaar  Side effects: Not taking Amlodipine every day because she had swelling in legs. Went away when stopped taking it. Notes that she takes amlodipine every so often when her BP is above goal.  ROS: Denies headache, dizziness, visual changes, nausea, vomiting, abdominal pain or shortness of breath. BP Readings from Last 3 Encounters:  01/09/17 (!) 148/88  12/11/16 138/78  12/05/16 (!) 156/90   2. Chest Pain: Patient complains of chest pain. Onset was 2 months ago, with unchanged course since that time. The patient describes the pain as constant, dull in nature, does not radiate. Patient rates pain as a 3/10 in intensity.  Associated symptoms are lower extremity edema and when taking amlodipine. Aggravating factors are stress.  Alleviating factors are: none. Patient's cardiac risk factors are hypertension and obesity (BMI >= 30 kg/m2).  Patient's risk factors for DVT/PE: SLE.  Patient works as a Facilities manager notes that she has a new client lives in her house with her with intellectual disability. Patient notes that since this client has moved and she has had increased stressed and she thinks that this is what is causing her chest pain.  Review of Systems: Per HPI.   Health Maintenance Due  Topic Date Due  . MAMMOGRAM  12/30/2014  . INFLUENZA VACCINE  11/28/2016    Past Medical History: Patient Active Problem List   Diagnosis Date Noted  . Petechial rash 10/12/2016  . Medication  management 06/16/2016  . Dyspareunia in female 06/16/2016  . Hydradenitis 11/28/2015  . Chest pain 09/22/2015  . HLD (hyperlipidemia) 06/14/2015  . Morbid obesity (HCC) 06/14/2015  . Leg swelling 10/10/2014  . Insomnia 12/15/2012  . Essential hypertension, benign 04/10/2012  . SLE (systemic lupus erythematosus) (HCC) 04/10/2012    Medications: reviewed and updated Current Outpatient Prescriptions  Medication Sig Dispense Refill  . atorvastatin (LIPITOR) 10 MG tablet TAKE ONE TABLET BY MOUTH ONCE DAILY 30 tablet 3  . carvedilol (COREG) 12.5 MG tablet Take 1 tablet (12.5 mg total) by mouth 2 (two) times daily with a meal. 60 tablet 3  . hydrochlorothiazide (HYDRODIURIL) 25 MG tablet TAKE 1 TABLET BY MOUTH ONCE DAILY 90 tablet 0  . losartan (COZAAR) 100 MG tablet TAKE ONE TABLET BY MOUTH ONCE DAILY 90 tablet 1  . potassium chloride (K-DUR) 10 MEQ tablet TAKE 1 TABLET BY MOUTH ONCE DAILY 90 tablet 0  . cetirizine (ZYRTEC) 10 MG tablet TAKE 1 TABLET BY MOUTH AS NEEDED FOR  ALLERGIES (Patient not taking: Reported on 01/09/2017) 30 tablet 1  . fluticasone (FLONASE) 50 MCG/ACT nasal spray Place 2 sprays into both nostrils daily. (Patient not taking: Reported on 06/13/2016) 16 g 0  . triamcinolone cream (KENALOG) 0.1 % Apply 1 application topically 2 (two) times daily. (Patient not taking: Reported on 01/09/2017) 30 g 0   No current facility-administered medications for this visit.     Social Hx:  reports that she has never  smoked. She has never used smokeless tobacco.   Objective:   BP (!) 148/88   Pulse 63   Temp 98.4 F (36.9 C) (Oral)   Ht 5\' 5"  (1.651 m)   Wt 236 lb 6.4 oz (107.2 kg)   SpO2 97%   BMI 39.34 kg/m  Physical Exam  Gen: NAD, alert, cooperative with exam, well-appearing Cardiac: Regular rate and rhythm, normal S1/S2, no murmur, no edema, capillary refill brisk  Respiratory: Clear to auscultation bilaterally, no wheezes, non-labored breathing MSK: Nontender to  palpation throughout chest  Psych: good insight, normal mood and affect  Assessment & Plan:  Essential hypertension, benign Uncontrolled. Compliant with carvedilol and Cozaar. No longer taking amlodipine consistently secondary to leg edema but does take occasionally when her blood pressure is above goal. Uncontrolled BP likely multifactorial; noncompliance with heart healthy diet, lack of exercise, obesity,  possible OSA, stress at home. -Continue carvedilol, HCTZ, and Coreg as prescribed -Stop amlodipine entirely due to leg edema, placed on allergy list -Patient has not yet had her sleep study, encouraged her to call them and schedule this -Check blood pressure daily at home and keeping a log -Schedule appt with Dr. Raymondo Band for ambulatory blood pressure monitoring  -May consider adding clonidine to medication regimen if she continues to be uncontrolled at home  Chest pain Atypical chest pain located along left ribs in mid axillary line. Less likely cardiac in nature based on patient's symptoms, EKG performed today and showed normal sinus rhythm with no ST changes. Patient thinks that pain is secondary to stress. Less likely PE as patient not tachycardic (although she is on a beta blocker), no tachypnea, normal oxygen at 97% on room air. Pain possibly musculoskeletal in nature although it is not reproducible on exam. Of concen she notes she has had this pain before and was found to have "fluid in her lungs" from SLE. Per Epic review she has had an SLE flare that caused pain in this area before and was given prednisone. -Continue taking Tylenol  when necessary -Discussed obtaining a chest CT, patient prefers to just a chest x-ray at this time -Return if symptoms do not improve or worsen over the next week -Red flag symptoms and return precautions discussed   Orders Placed This Encounter  Procedures  . DG Chest 2 View    Standing Status:   Future    Standing Expiration Date:   03/11/2018     Order Specific Question:   Reason for Exam (SYMPTOM  OR DIAGNOSIS REQUIRED)    Answer:   chest pain, history of SLE with pleuritic effusion    Order Specific Question:   Preferred imaging location?    Answer:   [facility]    Order Specific Question:   Is the patient pregnant?    Answer:   No  . EKG 12-Lead   Addendum: CXR normal. Called patient to discuss this. Advised that she take Ibuprofen as needed instead of Tylenol. Discussed that if she continues to have pain over the next couple days or it gets worse, we may need to do further imaging such as a chest CT. She expressed good understanding and was appreciative of the call.    Anders Simmonds, MD [facility], PGY-3

## 2017-01-09 ENCOUNTER — Ambulatory Visit (HOSPITAL_COMMUNITY)
Admission: RE | Admit: 2017-01-09 | Discharge: 2017-01-09 | Disposition: A | Discharge status: Home / Self Care | Payer: BLUE CROSS/BLUE SHIELD | Source: Ambulatory Visit | Attending: Family Medicine | Admitting: Family Medicine

## 2017-01-09 ENCOUNTER — Ambulatory Visit (INDEPENDENT_AMBULATORY_CARE_PROVIDER_SITE_OTHER): Payer: BLUE CROSS/BLUE SHIELD | Admitting: Family Medicine

## 2017-01-09 ENCOUNTER — Encounter: Payer: Self-pay | Admitting: Family Medicine

## 2017-01-09 ENCOUNTER — Ambulatory Visit
Admission: RE | Admit: 2017-01-09 | Discharge: 2017-01-09 | Disposition: A | Discharge status: Home / Self Care | Payer: BLUE CROSS/BLUE SHIELD | Source: Ambulatory Visit | Attending: Family Medicine | Admitting: Family Medicine

## 2017-01-09 VITALS — BP 148/88 | HR 63 | Temp 98.4°F | Ht 65.0 in | Wt 236.4 lb

## 2017-01-09 DIAGNOSIS — R079 Chest pain, unspecified: Secondary | ICD-10-CM | POA: Diagnosis not present

## 2017-01-09 DIAGNOSIS — I10 Essential (primary) hypertension: Secondary | ICD-10-CM | POA: Diagnosis not present

## 2017-01-09 DIAGNOSIS — Z23 Encounter for immunization: Secondary | ICD-10-CM

## 2017-01-09 DIAGNOSIS — I517 Cardiomegaly: Secondary | ICD-10-CM | POA: Insufficient documentation

## 2017-01-09 NOTE — Patient Instructions (Addendum)
Thank you for coming in today, it was so nice to see you! Today we talked about:    Chest pain: We have done an ECG on you, your heart looks normal.   I have ordered a chest xray for you, you can go to [facility] to have this done  Blood pressure: Your blood pressure remains uncontrolled. Reasons to go to the hospital, if your chest pain gets worse, you have shortness of breath, or pass out  Please schedule an appointment with Dr. Raymondo Band for blood pressure in the next couple weeks You can schedule this appointment at the front desk before you leave or call the clinic.  Bring in all your medications or supplements to each appointment for review.   If we ordered any tests today, you will be notified via telephone of any abnormalities. If everything is normal you will get a letter in the mail.   If you have any questions or concerns, please do not hesitate to call the office at [phone]. You can also message me directly via MyChart.   Sincerely,  Anders Simmonds, MD

## 2017-01-09 NOTE — Assessment & Plan Note (Signed)
Atypical chest pain located along left ribs in mid axillary line. Less likely cardiac in nature based on patient's symptoms, EKG performed today and showed normal sinus rhythm with no ST changes. Patient thinks that pain is secondary to stress. Less likely PE as patient not tachycardic (although she is on a beta blocker), no tachypnea, normal oxygen at 97% on room air. Pain possibly musculoskeletal in nature although it is not reproducible on exam. Of concen she notes she has had this pain before and was found to have "fluid in her lungs" from SLE. Per Epic review she has had an SLE flare that caused pain in this area before and was given prednisone. -Continue taking Tylenol  when necessary -Discussed obtaining a chest CT, patient prefers to just a chest x-ray at this time -Return if symptoms do not improve or worsen over the next week -Red flag symptoms and return precautions discussed

## 2017-01-09 NOTE — Assessment & Plan Note (Addendum)
Uncontrolled. Compliant with carvedilol and Cozaar. No longer taking amlodipine consistently secondary to leg edema but does take occasionally when her blood pressure is above goal. Uncontrolled BP likely multifactorial; noncompliance with heart healthy diet, lack of exercise, obesity,  possible OSA, stress at home. -Continue carvedilol, HCTZ, and Coreg as prescribed -Stop amlodipine entirely due to leg edema, placed on allergy list -Patient has not yet had her sleep study, encouraged her to call them and schedule this -Check blood pressure daily at home and keeping a log -Schedule appt with Dr. Raymondo Band for ambulatory blood pressure monitoring  -May consider adding clonidine to medication regimen if she continues to be uncontrolled at home

## 2017-01-15 ENCOUNTER — Ambulatory Visit: Payer: BLUE CROSS/BLUE SHIELD

## 2017-01-15 ENCOUNTER — Ambulatory Visit
Admission: RE | Admit: 2017-01-15 | Discharge: 2017-01-15 | Disposition: A | Discharge status: Home / Self Care | Payer: BLUE CROSS/BLUE SHIELD | Source: Ambulatory Visit | Attending: Family Medicine | Admitting: Family Medicine

## 2017-01-15 DIAGNOSIS — Z1231 Encounter for screening mammogram for malignant neoplasm of breast: Secondary | ICD-10-CM

## 2017-01-31 ENCOUNTER — Ambulatory Visit (INDEPENDENT_AMBULATORY_CARE_PROVIDER_SITE_OTHER): Payer: BLUE CROSS/BLUE SHIELD | Admitting: Pharmacist

## 2017-01-31 ENCOUNTER — Encounter: Payer: Self-pay | Admitting: Pharmacist

## 2017-01-31 DIAGNOSIS — I10 Essential (primary) hypertension: Secondary | ICD-10-CM

## 2017-01-31 MED ORDER — SPIRONOLACTONE 25 MG PO TABS: 12.5000 mg | ORAL_TABLET | Freq: Every day | ORAL | 3 refills | Status: DC

## 2017-01-31 NOTE — Progress Notes (Signed)
   S:    Patient presents to the clinic for hypertension evaluation in good spirits. She is ambulating with no assistance. Patient was referred on 01/09/2017 by Dr. Jonathon Jordan, which was her last Primary Care Provider visit. Patient diagnosed with hypertension in 2010. At last visit, amlodpine was discontinued due to 2/2 LEE.  Today patient denies dizziness, light-headedness, or recent falls. Edema has improved. She endorses headaches throughout the night a few times a week. Patient has significant stress in her home-life as an "intellectual disability client" lives in the home - she states they are leaving at the end of the month.   Patient is taking her blood pressures at home, BP's ranging from 130s-170s/100's. She requested for [place] to calibrate her home cuff and at this time noted that her cuff size is "normal adult" and does not fit properly as she requires a "large adult" cuff size. She states she has surgery next week for carpal tunnel.  She walks 3x/week for about 30 minutes with her dog although has much room for sodium reduction in her diet reported below.   Patient reports adherence with BP medications listed below.  Current BP Medications include:  Carvedilol 12.5 mg twice daily, HCTZ 25 mg daily, losartan 100 mg daily  Antihypertensives tried in the past include: amlodipine 10 mg (lower extremity edema)  Dietary habits include: husband cooks, lots of canned foods Breakfast- rarely eats breakfast but sometimes eats cereal, recently been getting bojangles this week Lunch - hot dog and fries Supper - Did not eat Snacks - chips, popcorn, cheese doodles  Drinks - water, no caffeine usually. Cherry soda.   Normally will skip lunch or supper  O:   Last 3 Office BP readings: BP Readings from Last 3 Encounters:  01/09/17 (!) 148/88  12/11/16 138/78  12/05/16 (!) 156/90    BMET    Component Value Date/Time   NA 140 11/27/2016 1043   K 3.9 11/27/2016 1043   CL 101 11/27/2016 1043     CO2 25 11/27/2016 1043   GLUCOSE 84 11/27/2016 1043   GLUCOSE 101 (H) 06/13/2016 1436   BUN 12 11/27/2016 1043   CREATININE 0.83 11/27/2016 1043   CREATININE 0.78 06/13/2016 1436   CALCIUM 9.7 11/27/2016 1043   GFRNONAA 81 11/27/2016 1043   GFRNONAA 87 06/13/2016 1436   GFRAA 93 11/27/2016 1043   GFRAA >89 06/13/2016 1436    A/P: Hypertension longstanding > 8 years is uncontroled on current medications at home and in clinic at 142/102 mmHg with a goal of <130/32mmHg. As she is adherent and tolerating her current BP medication regimen, will require an additional agent to get to goal. Blood pressure is uncontrolled due to excessive sodium in her diet and obesity.  1. START spironolactone 12.5 mg daily   2. STOP potassium 10 meQ supplement 3. Discussed lifestyle modifications including increasing exercise and decreasing fast food/canned foods 3. Check BMET and BP in 1 week in clinic  Results reviewed and written information provided. Total time in face-to-face counseling 30 minutes.   Patient seen with Dellie Catholic, PharmD PGY1 Pharmacy Resident and Devota Pace, PharmD PGY2 Pharmacy Resident.

## 2017-01-31 NOTE — Assessment & Plan Note (Signed)
Hypertension longstanding > 8 years is uncontroled on current medications at home and in clinic at 142/102 mmHg with a goal of <130/29mmHg. As she is adherent and tolerating her current BP medication regimen, will require an additional agent to get to goal. Blood pressure is uncontrolled due to excessive sodium in her diet and obesity.  1. START spironolactone 12.5 mg daily   2. STOP potassium 10 meQ supplement 3. Discussed lifestyle modifications including increasing exercise and decreasing fast food/canned foods 3. Check BMET and BP in 1 week in clinic

## 2017-01-31 NOTE — Patient Instructions (Signed)
Thank you for coming to see Veronica [name] today!   1. STOP potassium  2. START Spironolactone 1/2 tablet (12.5 mg) once a day. Take in the morning.   3. Continue taking your other medications as well.   Come back to see Veronica [name] in ~1 week to get your labs drawn and recheck your blood pressure.

## 2017-02-04 NOTE — Progress Notes (Signed)
Patient ID: Veronica [name], female   DOB: 12/13/1960, 56 y.o.   MRN: [id number] Reviewed: Agree with Dr. Koval's documentation and management. 

## 2017-02-07 ENCOUNTER — Encounter: Payer: Self-pay | Admitting: Pharmacist

## 2017-02-07 ENCOUNTER — Ambulatory Visit (INDEPENDENT_AMBULATORY_CARE_PROVIDER_SITE_OTHER): Payer: BLUE CROSS/BLUE SHIELD | Admitting: Pharmacist

## 2017-02-07 DIAGNOSIS — I10 Essential (primary) hypertension: Secondary | ICD-10-CM | POA: Diagnosis not present

## 2017-02-07 MED ORDER — INDAPAMIDE 2.5 MG PO TABS: 2.5000 mg | ORAL_TABLET | Freq: Every day | ORAL | 2 refills | Status: DC

## 2017-02-07 MED ORDER — SPIRONOLACTONE 25 MG PO TABS: 25.0000 mg | ORAL_TABLET | Freq: Every day | ORAL | 2 refills | Status: DC

## 2017-02-07 NOTE — Progress Notes (Signed)
Patient ID: Veronica [name], female   DOB: Dec 28, 1960, 56 y.o.   MRN: [id number] Reviewed: agree with Dr. Macky Lower documentation and management.

## 2017-02-07 NOTE — Progress Notes (Signed)
   S:    Patient arrives in good spirits. She is ambulating with no assistance. Patient was referred on 01/09/2017 by Dr. Jonathon Jordan, which was her last Primary Care Provider visit. Patient diagnosed with hypertension in 2010.  At her last visit with Dr. Raymondo Band, blood pressure was uncontrolled at 142/102 mmHg. Spironolactone 12.5 mg daily was initiated and her potassium supplement was discontinued. Noted at this time that her cuff size is "normal adult" and does not fit properly as she requires a "large adult" cuff size.  Today patient denies dizziness, light-headedness, or recent falls. She endorses headaches throughout the night a few times a week and occasional dizziness. Patient has significant stress in her home-life as an "intellectual disability client" lives in the home - she states they are leaving at the end of the month. Patient was unable to purchase the larger size cuff so has not been taking blood pressure at home. She has stopped adding salt to her food.  Patient reports adherence with medications. Walks for 30 minutes a day with her dog.  Current BP Medications include:  carvedilol 12.5 mg twice daily, HCTZ 25 mg daily, losartan 100 mg daily, spironolactone 12.5 mg daily  Antihypertensives tried in the past include: amlodipine 10 mg (lower extremity edema)  Dietary habits include: Breakfast: ham biscuit, gravy biscuit Lunch: Bowl of cereal (corn flakes) Dinner: pizza, potatoes (added salt), grilled chicken wings   O:  Last 3 Office BP readings: BP Readings from Last 3 Encounters:  02/07/17 (!) 170/105  01/31/17 (!) 142/102  01/09/17 (!) 148/88   BMET    Component Value Date/Time   NA 140 11/27/2016 1043   K 3.9 11/27/2016 1043   CL 101 11/27/2016 1043   CO2 25 11/27/2016 1043   GLUCOSE 84 11/27/2016 1043   GLUCOSE 101 (H) 06/13/2016 1436   BUN 12 11/27/2016 1043   CREATININE 0.83 11/27/2016 1043   CREATININE 0.78 06/13/2016 1436   CALCIUM 9.7 11/27/2016 1043   GFRNONAA 81 11/27/2016 1043   GFRNONAA 87 06/13/2016 1436   GFRAA 93 11/27/2016 1043   GFRAA >89 06/13/2016 1436    A/P: Hypertension longstanding > 8 years is uncontroled on current medications in clinic at 170/105 mmHg and repeat 156/90 mmHg with a goal of <130/30mmHg. As she is adherent and tolerating her current BP medication regimen, will require optimization of current regimen. Blood pressure is uncontrolled due to excessive sodium in her diet, stress in her home, and obesity.   BMET today, will follow-up results.   Increase spironolactone from 12.5mg   to 25 mg daily.  STOP hydrochlorothiazide 25 mg daily. START indapamide 2.5 mg daily. Patient educated on purpose, proper use and potential adverse effects of indapamide.  Following instruction patient verbalized understanding of treatment plan.   Consider next visit - initiation of clonidine patch.  Results reviewed and written information provided. Total time in face-to-face counseling 30 minutes.   F/U Clinic Visit with Dr. Raymondo Band in 2-3 weeks. BMET in 2 weeks. Patient seen with Rip Harbour, PharmD Candidate, Dellie Catholic, PharmD PGY-1 Resident, and Devota Pace, PharmD, PGY2 Resident.

## 2017-02-07 NOTE — Assessment & Plan Note (Signed)
Hypertension longstanding > 8 years is uncontroled on current medications in clinic at 170/105 mmHg and repeat 156/90 mmHg with a goal of <130/30mmHg. As she is adherent and tolerating her current BP medication regimen, will require optimization of current regimen. Blood pressure is uncontrolled due to excessive sodium in her diet, stress in her home, and obesity.   BMET today, will follow-up results.   Increase spironolactone from 12.5mg   to 25 mg daily. STOP hydrochlorothiazide 25 mg daily.   START indapamide 2.5 mg daily. Patient educated on purpose, proper use and potential adverse effects of indapamide.  Following instruction patient verbalized understanding of treatment plan.

## 2017-02-07 NOTE — Patient Instructions (Addendum)
It was nice to see you again today!  Your blood pressure was 156/90 mmHg, our goal for you is <130/80 mmHg.   Increase your spironolactone to 25 mg daily.   START indapamide 2.5 mg daily. You can take this with your morning medications. We sent this to your pharmacy.   STOP hydrochlorothiazide 25 mg daily.  Call us if you're having lightheadedness, dizziness, or chest pain.  Follow-up with pharmacy clinic in 2-3 weeks.

## 2017-02-08 LAB — BASIC METABOLIC PANEL
BUN/Creatinine Ratio: 13 (ref 9–23)
BUN: 11 mg/dL (ref 6–24)
CO2: 24 mmol/L (ref 20–29)
Calcium: 9.6 mg/dL (ref 8.7–10.2)
Chloride: 100 mmol/L (ref 96–106)
Creatinine, Ser: 0.84 mg/dL (ref 0.57–1.00)
GFR calc Af Amer: 91 mL/min/{1.73_m2} (ref >59)
GFR calc non Af Amer: 79 mL/min/{1.73_m2} (ref >59)
Glucose: 94 mg/dL (ref 65–99)
Potassium: 4.1 mmol/L (ref 3.5–5.2)
Sodium: 138 mmol/L (ref 134–144)

## 2017-02-25 ENCOUNTER — Ambulatory Visit (INDEPENDENT_AMBULATORY_CARE_PROVIDER_SITE_OTHER): Payer: BLUE CROSS/BLUE SHIELD | Admitting: Pharmacist

## 2017-02-25 ENCOUNTER — Encounter: Payer: Self-pay | Admitting: Pharmacist

## 2017-02-25 VITALS — BP 146/70 | Ht 65.0 in | Wt 237.0 lb

## 2017-02-25 DIAGNOSIS — I10 Essential (primary) hypertension: Secondary | ICD-10-CM

## 2017-02-25 DIAGNOSIS — Z111 Encounter for screening for respiratory tuberculosis: Secondary | ICD-10-CM | POA: Diagnosis not present

## 2017-02-25 NOTE — Progress Notes (Signed)
Patient ID: Veronica [name], female   DOB: 17-Jan-1961, 56 y.o.   MRN: [id number] Reviewed: Agree with Dr. Macky Lower documentation and management.

## 2017-02-25 NOTE — Assessment & Plan Note (Signed)
Hypertension longstanding (>8 years) currently uncontrolled on current medications in clinic at 146/70 mmHg with a goal of <130/80 mmHg.  Continued current medication regimen of carvedilol 12.5 mg twice daily, losartan 100 mg daily, spironolactone 25 mg daily, and indapamide 2.5 mg daily. BP is uncontrolled due to stress in her home and obesity. Reviewed continuing to cut out sodium from diet. BMET today, will follow-up results.

## 2017-02-25 NOTE — Progress Notes (Signed)
   S:    Patient arrives in good spirits, ambulating without assistance. Presents to the clinic for hypertension evaluation. Patient is following up from last Rx Clinic visit on 02/07/17. Patient's last PCP visit was on 01/09/17 with Dr. Jonathon Jordan. Patient was diagnosed with hypertension in 2010.   At her last visit with Dr. Raymondo Band, BP was uncontrolled at 170/105 and 156/90 mmHg. Spironolactone was increased from 12.5 mg to 25 mg daily and Indapamide 2.5 daily was initiated.  Patient denies headaches, dizziness, light-headedness, or falls since last visit. Patient reports significant stress at home with an "intellectual disability client" that lives with her - states this client is leaving on 02/27/17.   Patient reports adherence with medications.  Current BP Medications include: Carvedilol 12.5 mg twice daily, HCTZ 25 mg daily, losartan 100 mg daily, spironolactone 25 mg daily, and indapamide 2.5 mg daily  Antihypertensives tried in the past include: Amlodipine 10 mg (lower extremity edema)  Dietary habits include: typically 2 meals/day Brunch: Bowl of cereal Dinner: Pinto beans, cabbage, steak with gravy, mashed potatoes, collard greens, tuna w/ crackers Patient reports eating out less and cutting down on the seasoning when cooking at home. Patient reports drinking more water.  O:  Last 3 Office BP readings: BP Readings from Last 3 Encounters:  02/25/17 (!) 146/70  02/07/17 (!) 156/90  01/31/17 (!) 142/102   BMET    Component Value Date/Time   NA 138 02/07/2017 1021   K 4.1 02/07/2017 1021   CL 100 02/07/2017 1021   CO2 24 02/07/2017 1021   GLUCOSE 94 02/07/2017 1021   GLUCOSE 101 (H) 06/13/2016 1436   BUN 11 02/07/2017 1021   CREATININE 0.84 02/07/2017 1021   CREATININE 0.78 06/13/2016 1436   CALCIUM 9.6 02/07/2017 1021   GFRNONAA 79 02/07/2017 1021   GFRNONAA 87 06/13/2016 1436   GFRAA 91 02/07/2017 1021   GFRAA >89 06/13/2016 1436    A/P: Hypertension longstanding (>8  years) currently uncontrolled on current medications in clinic at 146/70 mmHg with a goal of <130/80 mmHg.  Continued current medication regimen of carvedilol 12.5 mg twice daily, losartan 100 mg daily, spironolactone 25 mg daily, and indapamide 2.5 mg daily. BP is uncontrolled due to stress in her home and obesity. Reviewed continuing to cut out sodium from diet. BMET today, will follow-up results.   Results reviewed and written information provided. Total time in face-to-face counseling 25 minutes.   F/U Clinic Visit with Dr. Raymondo Band in 3-4 weeks.  Patient seen with Rip Harbour, PharmD Candidate, and Emeline General, PharmD Candidate.

## 2017-02-25 NOTE — Patient Instructions (Signed)
Thanks for coming in to see [place] today!  No changes were made to your medications today, continue to take your medications as you have been.  Continue to work on eating out less and drinking more water. Great job with the changes you've made thus far!  We don't expect any issues, however, we will give you a call if we notice anything abnormal from your labs drawn today.  Come back to see [place] again in Rx Clinic in 3-4 weeks.

## 2017-02-26 LAB — BASIC METABOLIC PANEL
BUN/Creatinine Ratio: 10 (ref 9–23)
BUN: 9 mg/dL (ref 6–24)
CO2: 25 mmol/L (ref 20–29)
Calcium: 9.3 mg/dL (ref 8.7–10.2)
Chloride: 99 mmol/L (ref 96–106)
Creatinine, Ser: 0.89 mg/dL (ref 0.57–1.00)
GFR calc Af Amer: 85 mL/min/{1.73_m2} (ref >59)
GFR calc non Af Amer: 74 mL/min/{1.73_m2} (ref >59)
Glucose: 145 mg/dL — ABNORMAL HIGH (ref 65–99)
Potassium: 3.7 mmol/L (ref 3.5–5.2)
Sodium: 138 mmol/L (ref 134–144)

## 2017-02-27 ENCOUNTER — Ambulatory Visit (INDEPENDENT_AMBULATORY_CARE_PROVIDER_SITE_OTHER): Payer: BLUE CROSS/BLUE SHIELD | Admitting: *Deleted

## 2017-02-27 DIAGNOSIS — Z111 Encounter for screening for respiratory tuberculosis: Secondary | ICD-10-CM

## 2017-02-27 LAB — TB SKIN TEST
Induration: 0 mm
TB Skin Test: NEGATIVE

## 2017-02-27 NOTE — Progress Notes (Signed)
   PPD Reading Note PPD read and results entered in Epic Result: 0 mm induration. Interpretation: Negative  Allergic reaction: no Letter with result given to patient L. Leward Quan, RN, BSN

## 2017-02-28 ENCOUNTER — Encounter: Payer: Self-pay | Admitting: Family Medicine

## 2017-03-12 ENCOUNTER — Other Ambulatory Visit: Payer: Self-pay | Admitting: Family Medicine

## 2017-03-18 ENCOUNTER — Encounter: Payer: Self-pay | Admitting: Pharmacist

## 2017-03-18 ENCOUNTER — Ambulatory Visit: Payer: BLUE CROSS/BLUE SHIELD | Admitting: Pharmacist

## 2017-03-18 DIAGNOSIS — I10 Essential (primary) hypertension: Secondary | ICD-10-CM | POA: Diagnosis not present

## 2017-03-18 NOTE — Patient Instructions (Addendum)
Thanks for coming in to see [place] today!  Great work on getting your blood pressure down! No changes were made to your medications today.  Labs were drawn today - and we will call you with any concerns.  Work on getting your blood sugar under control so we don't have to start a diabetes medication. Diet and exercise are going to be the best way to do this. Going for a walk within an hour of eating is the best time to exercise, even for a few minutes.  Come back to see Dr. Jonathon Jordan in 1-2 weeks.

## 2017-03-18 NOTE — Assessment & Plan Note (Signed)
Hypertension longstanding currently controlled on current medications with an office BP measurement of 138/88. BP much improved since her last visit. Consider the source of her daily headaches - potentially drug-related, however, more likely due to stress. Consider the addition of low-dose nifedipine ER 30 mg once daily or clonidine patch/PO as additional BP therapy if needed in the future. Continued current medication regimen of carvedilol 12.5 mg BID, losartan 100 mg daily, spironolactone 25 mg daily, and indapamide 2.5 mg daily. Counseled patient on ways to improve her diet and exercise habits.

## 2017-03-18 NOTE — Progress Notes (Addendum)
Patient ID: Veronica [name], female   DOB: 12-14-1960, 56 y.o.   MRN: [id number] Reviewed: Agree with Dr. Macky Lower documentation and management.  Laboratory evaluations stable.  No changes in management.

## 2017-03-18 NOTE — Progress Notes (Signed)
S:    Patient arrives in good spirits, ambulating without assistance. Presents to the clinic for hypertension evaluation. Patient is following up from last Rx Clinic visit on 02/25/17. Patient was last seen by Primary Care Provider on 01/09/17 with Dr. Jonathon Jordan.   At her last visit with Dr. Raymondo Band, BP was uncontrolled at 146/70, likely due to stress and obesity. No changes in her medication regimen were made. Patient reports her stress has improved significantly since her last visit due to her in-home "intellectual disability client" that lived with her (moved out 02/27/17).  Patient reports adherence with medications. Current BP Medications include:  carvedilol 12.5 mg BID, losartan 100 mg daily, spironolactone 25 mg daily, and indapamide 2.5 mg daily Antihypertensives tried in the past include: Amlodipine 10 mg (lower extremity edema)  Dietary habits include: cereal (corn flakes), rice, and no restrictions when she eats out. Working on cutting down on sweets and eating out.  Patient reports daily headaches - she thinks this is due to her stress over her elevated BG from her last BMET (02/25/17).  Reports having A1c performed at her gynecologist office and resulted as 6.5.  She expressed concern about this reading as she knew it was elevated since previous.   She is aware we could not see this result as the GYN office uses another medical record system.   O:  Last 3 Office BP readings: BP Readings from Last 3 Encounters:  03/18/17 138/88  02/25/17 (!) 146/70  02/07/17 (!) 156/90   BMET    Component Value Date/Time   NA 138 02/25/2017 1106   K 3.7 02/25/2017 1106   CL 99 02/25/2017 1106   CO2 25 02/25/2017 1106   GLUCOSE 145 (H) 02/25/2017 1106   GLUCOSE 101 (H) 06/13/2016 1436   BUN 9 02/25/2017 1106   CREATININE 0.89 02/25/2017 1106   CREATININE 0.78 06/13/2016 1436   CALCIUM 9.3 02/25/2017 1106   GFRNONAA 74 02/25/2017 1106   GFRNONAA 87 06/13/2016 1436   GFRAA 85 02/25/2017  1106   GFRAA >89 06/13/2016 1436   A/P: #HTN Hypertension longstanding currently controlled on current medications with an office BP measurement of 138/88. BP much improved since her last visit. Consider the source of her daily headaches - potentially drug-related, however, more likely due to stress. Consider the addition of low-dose nifedipine ER 30 mg once daily or clonidine patch/PO as additional BP therapy if needed in the future. Continued current medication regimen of carvedilol 12.5 mg BID, losartan 100 mg daily, spironolactone 25 mg daily, and indapamide 2.5 mg daily. Counseled patient on ways to improve her diet and exercise habits.  #Pre-Diabetes Recently elevated BG levels, currently uncontrolled, with last BG of 145 (02/25/17). Not on any DM medications at this time. She is concerned about her blood sugar since obtaining the A1C result of 6.5 from her Gynecologist measured lab.  Discussed the option of adding metformin if needed, however, patient wanted to try to work on her diet and exercise before adding a DM medication. Patient is willing to make changes to her diet and exercise to avoid the addition of another medication. BMET ordered today to check BG as patient was very concerned about the elevated number from 02/25/17. Re-check BG at next visit. Consider a visit with Dr. Gerilyn Pilgrim for diabetes nutrition management if additional education is appropriate.    Results reviewed and written information provided.   Total time in face-to-face counseling 20 minutes.   F/U with Dr. Jonathon Jordan in 1-2 weeks.  Patient seen with Rip Harbour, PharmD Candidate.

## 2017-03-19 LAB — BASIC METABOLIC PANEL
BUN/Creatinine Ratio: 15 (ref 9–23)
BUN: 14 mg/dL (ref 6–24)
CO2: 24 mmol/L (ref 20–29)
Calcium: 10 mg/dL (ref 8.7–10.2)
Chloride: 101 mmol/L (ref 96–106)
Creatinine, Ser: 0.92 mg/dL (ref 0.57–1.00)
GFR calc Af Amer: 82 mL/min/{1.73_m2} (ref >59)
GFR calc non Af Amer: 71 mL/min/{1.73_m2} (ref >59)
Glucose: 96 mg/dL (ref 65–99)
Potassium: 4 mmol/L (ref 3.5–5.2)
Sodium: 139 mmol/L (ref 134–144)

## 2017-03-20 ENCOUNTER — Encounter: Payer: Self-pay | Admitting: Family Medicine

## 2017-04-04 ENCOUNTER — Ambulatory Visit: Payer: BLUE CROSS/BLUE SHIELD | Admitting: Family Medicine

## 2017-04-06 ENCOUNTER — Other Ambulatory Visit: Payer: Self-pay | Admitting: Student in an Organized Health Care Education/Training Program

## 2017-04-08 ENCOUNTER — Ambulatory Visit: Payer: BLUE CROSS/BLUE SHIELD | Admitting: Family Medicine

## 2017-04-10 ENCOUNTER — Institutional Professional Consult (permissible substitution): Payer: BLUE CROSS/BLUE SHIELD | Admitting: Pulmonary Disease

## 2017-04-17 NOTE — Progress Notes (Signed)
Subjective:    Patient ID: Veronica [name] , female   DOB: [birth year] , 56 y.o..   MRN: [id number]  HPI  Veronica [name] is a 56 yo F with PMH of SLE, hypertension, hyperlipidemia, morbid obesity here for  Chief Complaint  Patient presents with  . Hypertension    1. Hypertension Blood pressure at home:  Exercise: Minimal.  Low salt diet: Compliant Medications: Compliant with carvedilol 12.5 mg BID, losartan 100 mg daily, spironolactone 25 mg daily, and indapamide 2.5 mg daily Side effects: None ROS: Denies headache, dizziness, visual changes, nausea, vomiting, chest pain, abdominal pain or shortness of breath. BP Readings from Last 3 Encounters:  04/18/17 126/78  03/18/17 138/88  02/25/17 (!) 146/70   2. Hx of Prediabetes: Notes that at her OB GYN her A1C was 6.2 about 1 month ago. She is worried about this.  She notes that she does check her sugars at her work and they are typically 80-100.  One time she checked her sugar and it was 117 after eating.  She notes that she does have a sweet tooth and eats cake and ice cream often.  She is going to try and start Jazzercise.  3.  Stress at home: Patient notes that she is stressed over her husband.  She notes that her does not help out around the house.  She comes home from work every day and has to clean up after him and her children.  She notes that he also does not help out financially and this is very stressful for her she pays most of the bills.  She denies any depression or anxiety but notes that she would like to speak with a behavioral health specialist just to talk about stress.  Review of Systems: Per HPI.  Past Medical History: Patient Active Problem List   Diagnosis Date Noted  . History of prediabetes 04/18/2017  . Stress at home 04/18/2017  . Petechial rash 10/12/2016  . Medication management 06/16/2016  . Dyspareunia in female 06/16/2016  . Hydradenitis 11/28/2015  . Chest pain 09/22/2015  . HLD (hyperlipidemia)  06/14/2015  . Morbid obesity (HCC) 06/14/2015  . Leg swelling 10/10/2014  . Insomnia 12/15/2012  . Essential hypertension, benign 04/10/2012  . SLE (systemic lupus erythematosus) (HCC) 04/10/2012    Medications: reviewed   Social Hx:  reports that  has never smoked. she has never used smokeless tobacco.   Objective:   BP 126/78   Pulse 66   Temp 98.3 F (36.8 C) (Oral)   Ht 5\' 4"  (1.626 m)   Wt 236 lb 6.4 oz (107.2 kg)   SpO2 96%   BMI 40.58 kg/m  Physical Exam  Gen: NAD, alert, cooperative with exam, well-appearing Psych: good insight, normal mood and affect  Assessment & Plan:  Essential hypertension, benign Controlled on current regimen. - Continue carvedilol 12.5 mg twice daily, losartan 100 mg daily, spironolactone 25 mg daily, and Indapamide 2.5 mg daily -She is going to start exercising "Jazzercise" - Return in 1 month  History of prediabetes History of prediabetes.  Patient notes that last A1c was checked a month ago at her OB/GYN it was 6.2. -Recheck hemoglobin A1c today -Patient would like to check her glucose at home, glucometer and supplies sent to her pharmacy - Not agreeable to starting any medications if her A1c is in prediabetic or diabetic range.  She wants to do diet and exercise -Return in 1 month  Stress at home Marital stress. Denies depression  or anxiety at this time. Is interested in speaking with a behavioral health consultant for stress management. She will schedule this appointment today.  Orders Placed This Encounter  Procedures  . Hemoglobin A1c  . TSH   Meds ordered this encounter  Medications  . carvedilol (COREG) 12.5 MG tablet    Sig: TAKE 1 TABLET BY MOUTH TWICE DAILY WITH  A  MEAL    Dispense:  60 tablet    Refill:  3    Please consider 90 day supplies to promote better adherence  . cetirizine (ZYRTEC) 10 MG tablet    Sig: TAKE 1 TABLET BY MOUTH AS NEEDED FOR  ALLERGIES    Dispense:  30 tablet    Refill:  1    Please  consider 90 day supplies to promote better adherence  . indapamide (LOZOL) 2.5 MG tablet    Sig: Take 1 tablet (2.5 mg total) by mouth daily.    Dispense:  30 tablet    Refill:  2  . losartan (COZAAR) 100 MG tablet    Sig: Take 1 tablet (100 mg total) by mouth daily.    Dispense:  90 tablet    Refill:  1  . spironolactone (ALDACTONE) 25 MG tablet    Sig: Take 1 tablet (25 mg total) by mouth daily.    Dispense:  30 tablet    Refill:  2  . Blood Glucose Monitoring Suppl (BAYER CONTOUR NEXT USB MONITOR) w/Device KIT    Sig: 1 application by Does not apply route every morning.    Dispense:  1 kit    Refill:  1  . glucose blood test strip    Sig: Use as instructed    Dispense:  100 each    Refill:  12  . Lancet Devices (MICROLET NEXT LANCING DEVICE) MISC    Sig: 1 application by Does not apply route every morning.    Dispense:  1 each    Refill:  2  . MICROLET LANCETS MISC    Sig: 1 application by Does not apply route every morning.    Dispense:  100 each    Refill:  1    Anders Simmonds, MD [facility], PGY-3

## 2017-04-18 ENCOUNTER — Ambulatory Visit: Payer: BLUE CROSS/BLUE SHIELD | Admitting: Family Medicine

## 2017-04-18 ENCOUNTER — Encounter: Payer: Self-pay | Admitting: Family Medicine

## 2017-04-18 ENCOUNTER — Other Ambulatory Visit: Payer: Self-pay

## 2017-04-18 VITALS — BP 126/78 | HR 66 | Temp 98.3°F | Ht 64.0 in | Wt 236.4 lb

## 2017-04-18 DIAGNOSIS — R058 Other specified cough: Secondary | ICD-10-CM

## 2017-04-18 DIAGNOSIS — Z87898 Personal history of other specified conditions: Secondary | ICD-10-CM

## 2017-04-18 DIAGNOSIS — F439 Reaction to severe stress, unspecified: Secondary | ICD-10-CM | POA: Diagnosis not present

## 2017-04-18 DIAGNOSIS — R7303 Prediabetes: Secondary | ICD-10-CM | POA: Diagnosis not present

## 2017-04-18 DIAGNOSIS — I10 Essential (primary) hypertension: Secondary | ICD-10-CM

## 2017-04-18 DIAGNOSIS — R05 Cough: Secondary | ICD-10-CM

## 2017-04-18 MED ORDER — CARVEDILOL 12.5 MG PO TABS: ORAL_TABLET | ORAL | 3 refills | Status: DC

## 2017-04-18 MED ORDER — LOSARTAN POTASSIUM 100 MG PO TABS: 100.0000 mg | ORAL_TABLET | Freq: Every day | ORAL | 1 refills | Status: DC

## 2017-04-18 MED ORDER — MICROLET NEXT LANCING DEVICE MISC: 1.0000 "application " | 2 refills | Status: DC

## 2017-04-18 MED ORDER — INDAPAMIDE 2.5 MG PO TABS: 2.5000 mg | ORAL_TABLET | Freq: Every day | ORAL | 2 refills | Status: DC

## 2017-04-18 MED ORDER — MICROLET LANCETS MISC: 1.0000 "application " | 1 refills | Status: DC

## 2017-04-18 MED ORDER — GLUCOSE BLOOD VI STRP: ORAL_STRIP | 12 refills | Status: DC

## 2017-04-18 MED ORDER — CETIRIZINE HCL 10 MG PO TABS: ORAL_TABLET | ORAL | 1 refills | Status: DC

## 2017-04-18 MED ORDER — BAYER CONTOUR NEXT USB MONITOR W/DEVICE KIT: 1.0000 "application " | PACK | 1 refills | Status: DC

## 2017-04-18 MED ORDER — SPIRONOLACTONE 25 MG PO TABS: 25.0000 mg | ORAL_TABLET | Freq: Every day | ORAL | 2 refills | Status: DC

## 2017-04-18 NOTE — Assessment & Plan Note (Signed)
Marital stress. Denies depression or anxiety at this time. Is interested in speaking with a behavioral health consultant for stress management. She will schedule this appointment today.

## 2017-04-18 NOTE — Assessment & Plan Note (Signed)
History of prediabetes.  Patient notes that last A1c was checked a month ago at her OB/GYN it was 6.2. -Recheck hemoglobin A1c today -Patient would like to check her glucose at home, glucometer and supplies sent to her pharmacy - Not agreeable to starting any medications if her A1c is in prediabetic or diabetic range.  She wants to do diet and exercise -Return in 1 month

## 2017-04-18 NOTE — Assessment & Plan Note (Signed)
Controlled on current regimen. - Continue carvedilol 12.5 mg twice daily, losartan 100 mg daily, spironolactone 25 mg daily, and Indapamide 2.5 mg daily -She is going to start exercising "Jazzercise" - Return in 1 month

## 2017-04-18 NOTE — Patient Instructions (Signed)
Thank you for coming in today, it was so nice to see you! Today we talked about:    Hypertension: Your are doing wonderful with your blood pressure. It is normal today. Continue taking your medications as prescribed. I have refilled all of these  We are checking your A1C today. If it is abnormal I will call you.   Please follow up in 1 month. You can schedule this appointment at the front desk before you leave or call the clinic.   If you have any questions or concerns, please do not hesitate to call the office at [phone]. You can also message me directly via MyChart.   Sincerely,  Anders Simmonds, MD

## 2017-04-19 ENCOUNTER — Encounter: Payer: Self-pay | Admitting: Family Medicine

## 2017-04-19 ENCOUNTER — Ambulatory Visit (INDEPENDENT_AMBULATORY_CARE_PROVIDER_SITE_OTHER): Payer: BLUE CROSS/BLUE SHIELD | Admitting: Psychology

## 2017-04-19 DIAGNOSIS — Z630 Problems in relationship with spouse or partner: Secondary | ICD-10-CM | POA: Insufficient documentation

## 2017-04-19 LAB — HEMOGLOBIN A1C
Est. average glucose Bld gHb Est-mCnc: 131 mg/dL
Hgb A1c MFr Bld: 6.2 % — ABNORMAL HIGH (ref 4.8–5.6)

## 2017-04-19 LAB — TSH: TSH: 1.29 u[IU]/mL (ref 0.450–4.500)
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
## 2017-04-19 NOTE — Assessment & Plan Note (Signed)
Assessment/Plan Recommendations:  Patient is struggling with marital problems and presents with mild depression (PHQ9 score=9). [facility] discussed the extent to which patient has discussed her marital dissatisfaction this with her husband and it appears that she verbalizes her frustration during emotionally-laden times. [facility] discussed patient coming in together with her husband  to discuss her frustration. Though husband has previously been unwilling to go to counseling patient will ask him again. Patient will return to see [facility] in 3 weeks.

## 2017-05-10 ENCOUNTER — Ambulatory Visit: Payer: BLUE CROSS/BLUE SHIELD

## 2017-05-15 ENCOUNTER — Telehealth: Payer: Self-pay | Admitting: Psychology

## 2017-05-15 NOTE — Telephone Encounter (Signed)
Patient had seen me, Byrd Hesselbach and was put on Deborah's schedule when she called to reschedule her appointment. I called patient to make sure she is aware of this and give her option to retain appointment with Gavin Pound or reschedule with me for following Friday. Patient preferred to reschedule for me on 01/25.

## 2017-05-16 ENCOUNTER — Ambulatory Visit: Payer: BLUE CROSS/BLUE SHIELD

## 2017-05-24 ENCOUNTER — Ambulatory Visit (INDEPENDENT_AMBULATORY_CARE_PROVIDER_SITE_OTHER): Payer: BLUE CROSS/BLUE SHIELD | Admitting: Psychology

## 2017-05-24 DIAGNOSIS — Z630 Problems in relationship with spouse or partner: Secondary | ICD-10-CM

## 2017-05-24 NOTE — Assessment & Plan Note (Signed)
Assessment/Plan Recommendations:  Veronica [name] presented with stress due to marital troubles. Her affect appeared more elevated compared to the previous session and she attributed this to being able to share her emotional challenges.   While Veronica [name] is struggling with her marriage she appears to have a good social circle and leans on them, which will benefit her. [facility] engaged Veronica [name] in discussion about benefits and drawbacks of her marriage and Veronica [name] was forthcoming during the conversation. Veronica [name] will return to see [facility] in 4 week.
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
## 2017-05-27 ENCOUNTER — Ambulatory Visit: Payer: BLUE CROSS/BLUE SHIELD | Admitting: Family Medicine

## 2017-05-28 ENCOUNTER — Ambulatory Visit: Payer: BLUE CROSS/BLUE SHIELD

## 2017-05-28 ENCOUNTER — Institutional Professional Consult (permissible substitution): Payer: BLUE CROSS/BLUE SHIELD | Admitting: Pulmonary Disease

## 2017-06-06 NOTE — Progress Notes (Signed)
Subjective:    Patient ID: Veronica [name] , female   DOB: 10-08-1960 , 57 y.o..   MRN: [id number]  HPI  Veronica [name] is here for  Chief Complaint  Patient presents with  . Hypertension  . Prediabetes    1. Prediabetes: Has not checked her glucose because she hasn't been able to get the glucometer at the pharmacy. She states her nutrition is "not very good" because she eats at restaurants often. She loves cereal and eats sugary cereals. Will occasionally have a sugary beverage. Yesterday she had some vegetables, baked chicken, and a biscuit for dinner. Does not eat breakfast. She goes to the gym 3 times a week, she also walks her dog. She hasn't lost any weight. She is drinking lots of water. She does snack often at work; trail mix, graham crackers and she wants to "cut that out".   3. Hypertension Blood pressure at home: Does not check Exercise: Goes to the gym 3 times a week as above Low salt diet: Sometimes compliant Medications: Compliant with her medications Side effects: dizziness. Patient notes that sometimes at the gymn she gets dizzy and feels like she's going to pass out. Sometimes happens even when she's not working out. She feels overwhelmed or exhausted. Denies orthostasis.  ROS: Denies headache, visual changes, nausea, vomiting, chest pain, abdominal pain or shortness of breath. BP Readings from Last 3 Encounters:  06/07/17 118/76  04/18/17 126/78  03/18/17 138/88    Review of Systems: Per HPI.   Past Medical History: Patient Active Problem List   Diagnosis Date Noted  . Stress due to marital problems 04/19/2017  . History of prediabetes 04/18/2017  . Stress at home 04/18/2017  . Petechial rash 10/12/2016  . Medication management 06/16/2016  . Dyspareunia in female 06/16/2016  . Hydradenitis 11/28/2015  . Chest pain 09/22/2015  . HLD (hyperlipidemia) 06/14/2015  . Morbid obesity (HCC) 06/14/2015  . Leg swelling 10/10/2014  . Insomnia 12/15/2012  .  Essential hypertension, benign 04/10/2012  . SLE (systemic lupus erythematosus) (HCC) 04/10/2012    Medications: reviewed and updated Current Outpatient Medications  Medication Sig Dispense Refill  . acetaminophen (TYLENOL) 500 MG tablet Take 500 mg by mouth 2 (two) times daily as needed.    Marland Kitchen atorvastatin (LIPITOR) 10 MG tablet Take 1 tablet (10 mg total) by mouth daily. 30 tablet 3  . Blood Glucose Monitoring Suppl (BAYER CONTOUR NEXT USB MONITOR) w/Device KIT 1 application by Does not apply route every morning. 1 kit 1  . carvedilol (COREG) 6.25 MG tablet TAKE 1 TABLET BY MOUTH TWICE DAILY WITH  A  MEAL 60 tablet 1  . cetirizine (ZYRTEC) 10 MG tablet TAKE 1 TABLET BY MOUTH AS NEEDED FOR  ALLERGIES 30 tablet 1  . Cyclobenzaprine HCl (FLEXERIL PO) Take by mouth 2 (two) times daily as needed.    Marland Kitchen glucose blood test strip Use as instructed 100 each 12  . indapamide (LOZOL) 2.5 MG tablet Take 1 tablet (2.5 mg total) by mouth daily. 30 tablet 2  . Lancet Devices (MICROLET NEXT LANCING DEVICE) MISC 1 application by Does not apply route every morning. 1 each 2  . losartan (COZAAR) 100 MG tablet Take 1 tablet (100 mg total) by mouth daily. 90 tablet 1  . MICROLET LANCETS MISC 1 application by Does not apply route every morning. 100 each 1  . Multiple Vitamin (MULTIVITAMIN) tablet Take 1 tablet daily by mouth.    . spironolactone (ALDACTONE) 25 MG tablet  Take 1 tablet (25 mg total) by mouth daily. 30 tablet 2  . traMADol (ULTRAM) 50 MG tablet Take 50 mg by mouth 2 (two) times daily as needed.     No current facility-administered medications for this visit.     Social Hx:  reports that  has never smoked. she has never used smokeless tobacco.   Objective:   BP 118/76   Pulse (!) 58   Temp 98.2 F (36.8 C) (Oral)   Ht 5\' 4"  (1.626 m)   Wt 232 lb 9.6 oz (105.5 kg)   SpO2 99%   BMI 39.93 kg/m  Physical Exam  Gen: NAD, alert, cooperative with exam, well-appearing HEENT: NCAT, PERRL,  clear conjunctiva, oropharynx clear, supple neck Cardiac: Regular rate and rhythm, normal S1/S2, no murmur, no edema, capillary refill brisk  Respiratory: Clear to auscultation bilaterally, no wheezes, non-labored breathing Gastrointestinal: soft, non tender, non distended, bowel sounds present Skin: no rashes, normal turgor  Neurological: no gross deficits.  Psych: good insight, normal mood and affect   Assessment & Plan:  History of prediabetes Last A1c of 6.2 in December.  She has made some improvements with exercise however she continues to snack on sugary and carbohydrate rich foods at work.  Discussed some ways that she could cut down on this including bringing her own snacks to work. -Continue proper nutrition and exercise -Follow-up in 1 month for next A1c check  Essential hypertension, benign Controlled on current regimen.  Patient does have low heart rate of 58 today and does endorse feeling dizzy at times especially when exercising.  Concerned that she could have some bradycardia secondary to carvedilol use. -Decrease carvedilol to 6.25 mg twice daily instead of 12.25 -Continue losartan 100 mg daily, spironolactone 25 mg daily, indapamide 2.5 mg daily -Follow-up in 2 weeks   Anders Simmonds, MD [facility], PGY-3

## 2017-06-07 ENCOUNTER — Other Ambulatory Visit: Payer: Self-pay

## 2017-06-07 ENCOUNTER — Ambulatory Visit: Payer: BLUE CROSS/BLUE SHIELD | Admitting: Family Medicine

## 2017-06-07 ENCOUNTER — Encounter: Payer: Self-pay | Admitting: Family Medicine

## 2017-06-07 DIAGNOSIS — Z87898 Personal history of other specified conditions: Secondary | ICD-10-CM | POA: Diagnosis not present

## 2017-06-07 DIAGNOSIS — I10 Essential (primary) hypertension: Secondary | ICD-10-CM | POA: Diagnosis not present

## 2017-06-07 MED ORDER — BAYER CONTOUR NEXT USB MONITOR W/DEVICE KIT: 1.0000 "application " | PACK | 1 refills | Status: DC

## 2017-06-07 MED ORDER — INDAPAMIDE 2.5 MG PO TABS: 2.5000 mg | ORAL_TABLET | Freq: Every day | ORAL | 2 refills | Status: DC

## 2017-06-07 MED ORDER — ATORVASTATIN CALCIUM 10 MG PO TABS: 10.0000 mg | ORAL_TABLET | Freq: Every day | ORAL | 3 refills | Status: DC

## 2017-06-07 MED ORDER — LOSARTAN POTASSIUM 100 MG PO TABS: 100.0000 mg | ORAL_TABLET | Freq: Every day | ORAL | 1 refills | Status: DC

## 2017-06-07 MED ORDER — SPIRONOLACTONE 25 MG PO TABS: 25.0000 mg | ORAL_TABLET | Freq: Every day | ORAL | 2 refills | Status: DC

## 2017-06-07 MED ORDER — CARVEDILOL 6.25 MG PO TABS: ORAL_TABLET | ORAL | 1 refills | Status: DC

## 2017-06-07 NOTE — Patient Instructions (Signed)
Thank you for coming in today, it was so nice to see you! Today we talked about:    Prediabetes: I think that you would greatly benefit from seeing a nutritionist.  Please call Dr Gerilyn Pilgrim at [phone] to schedule an appointment.  Your heart rate is low, we are going to cut back on your coreg to the lower dose twice daily.   Please follow up in 2 week for prediabetes and heart rate. You can schedule this appointment at the front desk before you leave or call the clinic.  Bring in all your medications or supplements to each appointment for review.   If we ordered any tests today, you will be notified via telephone of any abnormalities. If everything is normal you will get a letter in the mail.   If you have any questions or concerns, please do not hesitate to call the office at [phone]. You can also message me directly via MyChart.   Sincerely,  Anders Simmonds, MD

## 2017-06-09 NOTE — Assessment & Plan Note (Addendum)
Controlled on current regimen.  Patient does have low heart rate of 58 today and does endorse feeling dizzy at times especially when exercising.  Concerned that she could have some bradycardia secondary to carvedilol use. -Decrease carvedilol to 6.25 mg twice daily instead of 12.25 -Continue losartan 100 mg daily, spironolactone 25 mg daily, indapamide 2.5 mg daily -Follow-up in 2 weeks

## 2017-06-09 NOTE — Assessment & Plan Note (Signed)
Last A1c of 6.2 in December.  She has made some improvements with exercise however she continues to snack on sugary and carbohydrate rich foods at work.  Discussed some ways that she could cut down on this including bringing her own snacks to work. -Continue proper nutrition and exercise -Follow-up in 1 month for next A1c check

## 2017-06-21 ENCOUNTER — Ambulatory Visit: Payer: BLUE CROSS/BLUE SHIELD

## 2017-06-27 ENCOUNTER — Ambulatory Visit: Payer: BLUE CROSS/BLUE SHIELD | Admitting: Family Medicine

## 2017-06-27 ENCOUNTER — Encounter: Payer: Self-pay | Admitting: Family Medicine

## 2017-06-27 ENCOUNTER — Other Ambulatory Visit: Payer: Self-pay

## 2017-06-27 DIAGNOSIS — I10 Essential (primary) hypertension: Secondary | ICD-10-CM | POA: Diagnosis not present

## 2017-06-27 MED ORDER — SPIRONOLACTONE 25 MG PO TABS: 25.0000 mg | ORAL_TABLET | Freq: Every day | ORAL | 2 refills | Status: DC

## 2017-06-27 MED ORDER — CARVEDILOL 6.25 MG PO TABS: ORAL_TABLET | ORAL | 1 refills | Status: DC

## 2017-06-27 MED ORDER — ATORVASTATIN CALCIUM 10 MG PO TABS: 10.0000 mg | ORAL_TABLET | Freq: Every day | ORAL | 3 refills | Status: DC

## 2017-06-27 MED ORDER — INDAPAMIDE 2.5 MG PO TABS: 2.5000 mg | ORAL_TABLET | Freq: Every day | ORAL | 2 refills | Status: DC

## 2017-06-27 NOTE — Progress Notes (Signed)
Subjective:    Patient ID: Veronica [name] , female   DOB: 1960-11-17 , 57 y.o..   MRN: [id number]  HPI  Veronica [name] is a 57 yo F with PMH ofSLE, hypertension, hyperlipidemia, morbid obesity here for   1. Hypertension Exercise: Goes to the gym 3 times a week  Low salt diet: Compliant sometimes Medications: Compliant with carvedilol 6.25 mg BID,losartan 100 mg daily, spironolactone 25 mg daily, and indapamide 2.5 mg daily Side effects: Was endorsing dizziness with exercising on 2/8. Additionally she had bradycardia. Her carvedilol was decreased to 6.25 mg BID ROS: Denies headache, dizziness, visual changes, nausea, vomiting, chest pain, abdominal pain or shortness of breath. BP Readings from Last 3 Encounters:  06/28/17 124/84  06/27/17 122/64  06/07/17 118/76    Review of Systems: Per HPI.   Past Medical History: Patient Active Problem List   Diagnosis Date Noted  . Stress due to marital problems 04/19/2017  . History of prediabetes 04/18/2017  . Stress at home 04/18/2017  . Petechial rash 10/12/2016  . Medication management 06/16/2016  . Dyspareunia in female 06/16/2016  . Hydradenitis 11/28/2015  . Chest pain 09/22/2015  . HLD (hyperlipidemia) 06/14/2015  . Morbid obesity (HCC) 06/14/2015  . Leg swelling 10/10/2014  . Insomnia 12/15/2012  . Essential hypertension, benign 04/10/2012  . SLE (systemic lupus erythematosus) (HCC) 04/10/2012    Medications: reviewed and updated Current Outpatient Medications  Medication Sig Dispense Refill  . acetaminophen (TYLENOL) 500 MG tablet Take 500 mg by mouth 2 (two) times daily as needed.    Marland Kitchen atorvastatin (LIPITOR) 10 MG tablet Take 1 tablet (10 mg total) by mouth daily. 90 tablet 3  . Blood Glucose Monitoring Suppl (BAYER CONTOUR NEXT USB MONITOR) w/Device KIT 1 application by Does not apply route every morning. 1 kit 1  . carvedilol (COREG) 6.25 MG tablet TAKE 1 TABLET BY MOUTH TWICE DAILY WITH  A  MEAL 180 tablet 1    . cetirizine (ZYRTEC) 10 MG tablet TAKE 1 TABLET BY MOUTH AS NEEDED FOR  ALLERGIES 30 tablet 1  . Cyclobenzaprine HCl (FLEXERIL PO) Take by mouth 2 (two) times daily as needed.    Marland Kitchen glucose blood test strip Use as instructed 100 each 12  . indapamide (LOZOL) 2.5 MG tablet Take 1 tablet (2.5 mg total) by mouth daily. 90 tablet 2  . Lancet Devices (MICROLET NEXT LANCING DEVICE) MISC 1 application by Does not apply route every morning. 1 each 2  . losartan (COZAAR) 100 MG tablet Take 1 tablet (100 mg total) by mouth daily. 90 tablet 1  . MICROLET LANCETS MISC 1 application by Does not apply route every morning. 100 each 1  . Multiple Vitamin (MULTIVITAMIN) tablet Take 1 tablet daily by mouth.    . spironolactone (ALDACTONE) 25 MG tablet Take 1 tablet (25 mg total) by mouth daily. 90 tablet 2  . traMADol (ULTRAM) 50 MG tablet Take 50 mg by mouth 2 (two) times daily as needed.     No current facility-administered medications for this visit.     Social Hx:  reports that  has never smoked. she has never used smokeless tobacco.   Objective:   BP 122/64   Pulse 67   Temp 98.3 F (36.8 C) (Oral)   Ht 5\' 4"  (1.626 m)   Wt 234 lb 9.6 oz (106.4 kg)   SpO2 96%   BMI 40.27 kg/m  Physical Exam  Gen: NAD, alert, cooperative with exam, well-appearing Cardiac: Regular  rate and rhythm, normal S1/S2, no murmur, no edema, capillary refill brisk  Respiratory: Clear to auscultation bilaterally, no wheezes, non-labored breathing Neurological: no gross deficits.  Psych: good insight, normal mood and affect  Assessment & Plan:  Essential hypertension, benign Controlled and at goal.  Patient seen 1 week ago and carvedilol was decreased to 6.25 mg twice daily from 12.5 mg twice daily due to tachycardia to 58.  Heart rate today improved at 67.  Patient not feeling as dizzy while working out. -Continue current regimen of losartan 100 mg daily, spironolactone 25 mg daily, indapamide 2.5 mg daily, and  carvedilol 6.25 mg twice daily -Follow-up in 1 month for blood pressure and pulse check as well as diabetic visit   Veronica Simmonds, MD [facility], PGY-3

## 2017-06-27 NOTE — Patient Instructions (Signed)
Thank you for coming in today, it was so nice to see you! Today we talked about:    Blood pressure and heart rate: Your blood pressure and heart rate are perfect today.  Continue taking the medications as you are  Good job exercising!  Very proud of you.  Keep this up.  Drink plenty of water, you want to drink enough water to her urine is clear to light yellow.  Continue to try and eat more fruits and vegetables and decrease fried foods  Please follow up in 1 month for blood pressure and A1c. You can schedule this appointment at the front desk before you leave or call the clinic.  Bring in all your medications or supplements to each appointment for review.   If you have any questions or concerns, please do not hesitate to call the office at [phone]. You can also message me directly via MyChart.   Sincerely,  Anders Simmonds, MD

## 2017-06-28 ENCOUNTER — Encounter: Payer: Self-pay | Admitting: Pulmonary Disease

## 2017-06-28 ENCOUNTER — Ambulatory Visit: Payer: BLUE CROSS/BLUE SHIELD | Admitting: Pulmonary Disease

## 2017-06-28 VITALS — BP 124/84 | HR 65 | Ht 64.0 in | Wt 233.4 lb

## 2017-06-28 DIAGNOSIS — R29818 Other symptoms and signs involving the nervous system: Secondary | ICD-10-CM | POA: Diagnosis not present

## 2017-06-28 DIAGNOSIS — Z6841 Body Mass Index (BMI) 40.0 and over, adult: Secondary | ICD-10-CM | POA: Diagnosis not present

## 2017-06-28 NOTE — Patient Instructions (Signed)
Will arrange for home sleep study Will call to arrange for follow up after sleep study reviewed  

## 2017-06-28 NOTE — Progress Notes (Signed)
[facility]  Chief Complaint  Patient presents with  . sleep consult    Pt referred by Dr. Cyndia Skeeters MD. Pt cannot stay asleep, pt can only 2-3 hrs at a time.    Vital signs: BP 124/84 (BP Location: Left Arm, Cuff Size: Normal)   Pulse 65   Ht 5' 4" (1.626 m)   Wt 233 lb 6.4 oz (105.9 kg)   SpO2 100%   BMI 40.06 kg/m   History of Present Illness: Veronica [name] is a 57 y.o. female for evaluation of sleep problems.  She had a sleep study in 2012.  Had few events, but not enough for dx of sleep apnea.  Her sleep issues have gotten worse.  She snores, and her husband says her breathing gets shallow.  She can't sleep on her back.  She sleeps with gum in her mouth to keep her mouth moist.  She dreams a lot.  She wakes up every two hours.  She has a hard time staying focused at times during the day.  She goes to sleep between 9 and 11 pm.  She falls asleep quickly.  She wakes up several times to use the bathroom.  She gets out of bed at 6 am.  She feels tired in the morning.  She has noticed getting headaches more often.  She does not use anything to help her fall sleep or stay awake.  She denies sleep walking, sleep talking, bruxism, or nightmares.  There is no history of restless legs.  She denies sleep hallucinations, sleep paralysis, or cataplexy.  The Epworth score is 2 out of 24.  Physical Exam:  General - pleasant Eyes - pupils reactive, wears glasses ENT - no sinus tenderness, no oral exudate, no LAN, over bite, scalloped tongue, enlarged tongue, MP 4 Cardiac - regular, no murmur Chest - no wheeze, rales Abd - soft, non tender Ext - no edema Skin - no rashes Neuro - normal strength Psych - normal mood  Discussion: She has snoring, sleep disruption, witnessed apnea, and daytime sleepiness.  She has history of hypertension.  Her mother has sleep apnea.  Her BMI is > 35.  I am concerned she has sleep apnea.  We discussed how sleep  apnea can affect various health problems, including risks for hypertension, cardiovascular disease, and diabetes.  We also discussed how sleep disruption can increase risks for accidents, such as while driving.  Weight loss as a means of improving sleep apnea was also reviewed.  Additional treatment options discussed were CPAP therapy, oral appliance, and surgical intervention.  Explained how for women the incident of sleep apnea increases after menopause.  Assessment/Plan:  Suspected sleep apnea. - will arrange for home sleep study  Obesity. - encouraged her to keep up with her exercise and diet regimen   Patient Instructions  Will arrange for home sleep study Will call to arrange for follow up after sleep study reviewed     Chesley Mires, MD [place] 06/28/2017, 11:03 AM Pager:  [phone]  Flow Sheet  Sleep tests: PSG 04/16/11 >> AHI 2.5, SpO2 low 90%  Review of Systems: Constitutional: Negative for fever and unexpected weight change.  HENT: Negative for congestion, dental problem, ear pain, nosebleeds, postnasal drip, rhinorrhea, sinus pressure, sneezing, sore throat and trouble swallowing.   Eyes: Negative for redness and itching.  Respiratory: Negative for cough, chest tightness, shortness of breath and wheezing.   Cardiovascular: Negative for palpitations and leg swelling.  Gastrointestinal: Negative for nausea and vomiting.  Genitourinary: Negative for dysuria.  Musculoskeletal: Negative for joint swelling.  Skin: Negative for rash.  Allergic/Immunologic: Negative.  Negative for environmental allergies, food allergies and immunocompromised state.  Neurological: Positive for headaches.  Hematological: Does not bruise/bleed easily.  Psychiatric/Behavioral: Negative for dysphoric mood. The patient is not nervous/anxious.    Past Medical History: She  has a past medical history of Daily headache, Fibromyalgia, History of blood transfusion (2010),  dizziness, Hypertension, Pneumonia (06/2014; 07/2014), Rheumatoid arthritis ([place]), and SLE (systemic lupus erythematosus) ([place]) (1998).  Past Surgical History: She  has a past surgical history that includes Cesarean section (1992, 1995); Cholecystectomy (N/A, 07/18/2012); ERCP (N/A, 07/29/2012); Abdominal hysterectomy (09/2008); Tubal ligation (1995); Incision and drainage (Right, 11/28/2015); and Incision and drainage abscess (Right, 12/02/2015).  Family History: Her family history includes Diabetes in her mother; Hypertension in her brother and father; Multiple sclerosis in her sister.  Social History: She  reports that  has never smoked. she has never used smokeless tobacco. She reports that she does not drink alcohol or use drugs.  Medications: Allergies as of 06/28/2017      Reactions   Amlodipine Swelling   Leg edema   Tessalon [benzonatate] Other (See Comments)   Lip swelling      Medication List        Accurate as of 06/28/17 11:03 AM. Always use your most recent med list.          acetaminophen 500 MG tablet Commonly known as:  TYLENOL Take 500 mg by mouth 2 (two) times daily as needed.   atorvastatin 10 MG tablet Commonly known as:  LIPITOR Take 1 tablet (10 mg total) by mouth daily.   BAYER CONTOUR NEXT USB MONITOR w/Device Kit 1 application by Does not apply route every morning.   carvedilol 6.25 MG tablet Commonly known as:  COREG TAKE 1 TABLET BY MOUTH TWICE DAILY WITH  A  MEAL   cetirizine 10 MG tablet Commonly known as:  ZYRTEC TAKE 1 TABLET BY MOUTH AS NEEDED FOR  ALLERGIES   FLEXERIL PO Take by mouth 2 (two) times daily as needed.   glucose blood test strip Use as instructed   indapamide 2.5 MG tablet Commonly known as:  LOZOL Take 1 tablet (2.5 mg total) by mouth daily.   losartan 100 MG tablet Commonly known as:  COZAAR Take 1 tablet (100 mg total) by mouth daily.   MICROLET LANCETS Misc 1 application by Does not apply route every morning.     MICROLET NEXT LANCING DEVICE Misc 1 application by Does not apply route every morning.   multivitamin tablet Take 1 tablet daily by mouth.   spironolactone 25 MG tablet Commonly known as:  ALDACTONE Take 1 tablet (25 mg total) by mouth daily.   traMADol 50 MG tablet Commonly known as:  ULTRAM Take 50 mg by mouth 2 (two) times daily as needed.

## 2017-06-28 NOTE — Progress Notes (Signed)
Subjective:    Patient ID: Veronica [name], female    DOB: 1961/02/09, 57 y.o.   MRN: [id number]  HPI    Review of Systems  Constitutional: Negative for fever and unexpected weight change.  HENT: Negative for congestion, dental problem, ear pain, nosebleeds, postnasal drip, rhinorrhea, sinus pressure, sneezing, sore throat and trouble swallowing.   Eyes: Negative for redness and itching.  Respiratory: Negative for cough, chest tightness, shortness of breath and wheezing.   Cardiovascular: Negative for palpitations and leg swelling.  Gastrointestinal: Negative for nausea and vomiting.  Genitourinary: Negative for dysuria.  Musculoskeletal: Negative for joint swelling.  Skin: Negative for rash.  Allergic/Immunologic: Negative.  Negative for environmental allergies, food allergies and immunocompromised state.  Neurological: Positive for headaches.  Hematological: Does not bruise/bleed easily.  Psychiatric/Behavioral: Negative for dysphoric mood. The patient is not nervous/anxious.        Objective:   Physical Exam        Assessment & Plan:

## 2017-06-30 NOTE — Assessment & Plan Note (Signed)
Controlled and at goal.  Patient seen 1 week ago and carvedilol was decreased to 6.25 mg twice daily from 12.5 mg twice daily due to tachycardia to 58.  Heart rate today improved at 67.  Patient not feeling as dizzy while working out. -Continue current regimen of losartan 100 mg daily, spironolactone 25 mg daily, indapamide 2.5 mg daily, and carvedilol 6.25 mg twice daily -Follow-up in 1 month for blood pressure and pulse check as well as diabetic visit

## 2017-07-25 ENCOUNTER — Ambulatory Visit: Payer: BLUE CROSS/BLUE SHIELD | Admitting: Family Medicine

## 2017-07-25 VITALS — BP 110/80 | HR 67 | Temp 97.8°F | Ht 64.0 in | Wt 229.8 lb

## 2017-07-25 DIAGNOSIS — Z87898 Personal history of other specified conditions: Secondary | ICD-10-CM

## 2017-07-25 DIAGNOSIS — I10 Essential (primary) hypertension: Secondary | ICD-10-CM | POA: Diagnosis not present

## 2017-07-25 LAB — POCT GLYCOSYLATED HEMOGLOBIN (HGB A1C): Hemoglobin A1C: 5.7

## 2017-07-25 NOTE — Progress Notes (Signed)
Subjective:    Patient ID: Veronica [name] , female   DOB: October 21, 1960 , 57 y.o..   MRN: [id number]  HPI  Veronica [name] is here for  Chief Complaint  Patient presents with  . Blood Pressure Check    1. Prediabetes: Patient notes that she has not made a lot of change to her diet however she has not been eating as much.  She notes that she has been going through some stressful things at home that she does not want to discuss and that has caused her to eat only one meal a day for the last week.  She notes that the stressful event has gotten better and she thinks that she can get back to normal schedule now.  She has been checking her glucoses and typically in the morning they are in the low 100s.  She has not exercise but she plans on going to the gym starting next week.  2. Hypertension Blood pressure at home: Does not check Low salt diet: Does not follow Medications: Compliant with all of her medications Side effects: Sometimes has dizziness ROS: Denies headache, visual changes, nausea, vomiting, chest pain, abdominal pain or shortness of breath. BP Readings from Last 3 Encounters:  07/25/17 110/80  06/28/17 124/84  06/27/17 122/64    Review of Systems: Per HPI.  Past Medical History: Patient Active Problem List   Diagnosis Date Noted  . Stress due to marital problems 04/19/2017  . History of prediabetes 04/18/2017  . Stress at home 04/18/2017  . Petechial rash 10/12/2016  . Medication management 06/16/2016  . Dyspareunia in female 06/16/2016  . Hydradenitis 11/28/2015  . Chest pain 09/22/2015  . HLD (hyperlipidemia) 06/14/2015  . Morbid obesity (HCC) 06/14/2015  . Leg swelling 10/10/2014  . Insomnia 12/15/2012  . Essential hypertension, benign 04/10/2012  . SLE (systemic lupus erythematosus) (HCC) 04/10/2012    Medications: reviewed and updated Current Outpatient Medications  Medication Sig Dispense Refill  . acetaminophen (TYLENOL) 500 MG tablet Take 500 mg by  mouth 2 (two) times daily as needed.    Marland Kitchen atorvastatin (LIPITOR) 10 MG tablet Take 1 tablet (10 mg total) by mouth daily. 90 tablet 3  . Blood Glucose Monitoring Suppl (BAYER CONTOUR NEXT USB MONITOR) w/Device KIT 1 application by Does not apply route every morning. 1 kit 1  . cetirizine (ZYRTEC) 10 MG tablet TAKE 1 TABLET BY MOUTH AS NEEDED FOR  ALLERGIES 30 tablet 1  . Cyclobenzaprine HCl (FLEXERIL PO) Take by mouth 2 (two) times daily as needed.    Marland Kitchen glucose blood test strip Use as instructed 100 each 12  . indapamide (LOZOL) 2.5 MG tablet Take 1 tablet (2.5 mg total) by mouth daily. 90 tablet 2  . Lancet Devices (MICROLET NEXT LANCING DEVICE) MISC 1 application by Does not apply route every morning. 1 each 2  . losartan (COZAAR) 100 MG tablet Take 1 tablet (100 mg total) by mouth daily. 90 tablet 1  . MICROLET LANCETS MISC 1 application by Does not apply route every morning. 100 each 1  . Multiple Vitamin (MULTIVITAMIN) tablet Take 1 tablet daily by mouth.    . spironolactone (ALDACTONE) 25 MG tablet Take 1 tablet (25 mg total) by mouth daily. 90 tablet 2  . traMADol (ULTRAM) 50 MG tablet Take 50 mg by mouth 2 (two) times daily as needed.     No current facility-administered medications for this visit.     Social Hx:  reports that she has  never smoked. She has never used smokeless tobacco.   Objective:   BP 110/80 (BP Location: Left Arm, Patient Position: Sitting, Cuff Size: Large)   Pulse 67   Temp 97.8 F (36.6 C) (Oral)   Ht 5\' 4"  (1.626 m)   Wt 229 lb 12.8 oz (104.2 kg)   SpO2 97%   BMI 39.45 kg/m  Physical Exam  Gen: NAD, alert, cooperative with exam, well-appearing Cardiac: Regular rate and rhythm Respiratory: Clear to auscultation bilaterally, no wheezes, non-labored breathing Gastrointestinal: soft, non tender, non distended, bowel sounds present Psych: good insight, normal mood and affect  Assessment & Plan:  History of prediabetes A1c down is 5.7 today, was 6.2  previously December.  Her nutrition status is poor, only eating 1 meal a day typically.  Recently had a stressful event in the family that she did not want to talk about that was contributing to her decreased appetite. -Discussed decreasing carbohydrate rich foods and eating at least 3 meals a day - She will call Dr. Gerilyn Pilgrim to schedule a nutrition visit, she is looking forward to this -Can recheck A1c in 6 months  Essential hypertension, benign Controlled and at goal.  BP 110/80 today and pulse is 67.  She notes she is still feeling dizzy at times which may be secondary to her heart rate.  Previously we had weaned her down from 12.5 mg of carvedilol twice a day to 6.25 mg twice a day - Stop carvedilol as this may be contributing to dizziness  -Continue losartan 100 mg daily, spironolactone 25 mg daily, indapamide 2.5 mg daily -Follow-up in 2 weeks  Orders Placed This Encounter  Procedures  . HgB A1c    Anders Simmonds, MD [facility], PGY-3

## 2017-07-25 NOTE — Patient Instructions (Signed)
Thank you for coming in today, it was so nice to see you! Today we talked about:    Blood pressure and heart rate: Stop the Carvedilol for at least 2 weeks and see me so we can recheck your pulse and pressure. You can check your pulse at home. Normal pulse is between 60-100. If it's above or below this please call me  Congratulation on lowering you A1C! You are doing great. Keep up with the exercise. Limit carbohydrate rich foods such as rice, potatoes, breads. No sugar beverages.   I think that you would greatly benefit from seeing a nutritionist.  Please call Dr Gerilyn Pilgrim at [phone] to schedule an appointment.  Please follow up in 2 weeks for pulse and blood pressure. You can schedule this appointment at the front desk before you leave or call the clinic.  Bring in all your medications or supplements to each appointment for review.   If we ordered any tests today, you will be notified via telephone of any abnormalities. If everything is normal you will get a letter in the mail.   If you have any questions or concerns, please do not hesitate to call the office at [phone]. You can also message me directly via MyChart.   Sincerely,  Anders Simmonds, MD

## 2017-07-27 NOTE — Assessment & Plan Note (Signed)
A1c down is 5.7 today, was 6.2 previously December.  Her nutrition status is poor, only eating 1 meal a day typically.  Recently had a stressful event in the family that she did not want to talk about that was contributing to her decreased appetite. -Discussed decreasing carbohydrate rich foods and eating at least 3 meals a day - She will call Dr. Gerilyn Pilgrim to schedule a nutrition visit, she is looking forward to this -Can recheck A1c in 6 months

## 2017-07-27 NOTE — Assessment & Plan Note (Signed)
Controlled and at goal.  BP 110/80 today and pulse is 67.  She notes she is still feeling dizzy at times which may be secondary to her heart rate.  Previously we had weaned her down from 12.5 mg of carvedilol twice a day to 6.25 mg twice a day - Stop carvedilol as this may be contributing to dizziness  -Continue losartan 100 mg daily, spironolactone 25 mg daily, indapamide 2.5 mg daily -Follow-up in 2 weeks

## 2017-08-07 DIAGNOSIS — G4733 Obstructive sleep apnea (adult) (pediatric): Secondary | ICD-10-CM | POA: Diagnosis not present

## 2017-08-08 ENCOUNTER — Other Ambulatory Visit: Payer: Self-pay | Admitting: *Deleted

## 2017-08-08 DIAGNOSIS — R29818 Other symptoms and signs involving the nervous system: Secondary | ICD-10-CM

## 2017-08-08 NOTE — Progress Notes (Signed)
Subjective:    Patient ID: Veronica [name] , female   DOB: 02/06/1961 , 57 y.o..   MRN: [id number]  HPI  Veronica [name] is here for  Chief Complaint  Patient presents with  . Hypertension    1. Hypertension Blood pressure at home: Does not check Exercise: Has been exercising more, goes to the gym 3 times a week Low salt diet: Compliant sometimes Medications: Compliant with her medications.  She stopped carvedilol and is feeling fine Side effects: none ROS: Denies headache, dizziness, visual changes, nausea, vomiting, chest pain, abdominal pain or shortness of breath. BP Readings from Last 3 Encounters:  08/09/17 126/62  07/25/17 110/80  06/28/17 124/84   Review of Systems: Per HPI.   Past Medical History: Patient Active Problem List   Diagnosis Date Noted  . Stress due to marital problems 04/19/2017  . History of prediabetes 04/18/2017  . Stress at home 04/18/2017  . Petechial rash 10/12/2016  . Medication management 06/16/2016  . Dyspareunia in female 06/16/2016  . Hydradenitis 11/28/2015  . Chest pain 09/22/2015  . HLD (hyperlipidemia) 06/14/2015  . Morbid obesity (HCC) 06/14/2015  . Leg swelling 10/10/2014  . Insomnia 12/15/2012  . Essential hypertension, benign 04/10/2012  . SLE (systemic lupus erythematosus) (HCC) 04/10/2012     Social Hx:  reports that she has never smoked. She has never used smokeless tobacco.   Objective:   BP 126/62   Pulse 67   Temp 98.7 F (37.1 C) (Oral)   Ht 5\' 4"  (1.626 m)   Wt 229 lb 6.4 oz (104.1 kg)   SpO2 99%   BMI 39.38 kg/m  Physical Exam  Gen: NAD, alert, cooperative with exam, well-appearing Cardiac: Regular rate and rhythm, normal S1/S2 Respiratory: Clear to auscultation bilaterally, no wheezes, non-labored breathing  Psych: good insight, normal mood and affect  Assessment & Plan:  Essential hypertension, benign Controlled and at goal.  BP 126/62 today and pulse of 67.  Carvedilol was discontinued 2 weeks  ago, her dizziness has improved. -Continue losartan 100 mg daily, spironolactone 25 mg daily, and indapamide 2.5 mg daily -Follow-up in 3 months  Meds ordered this encounter  Medications  . atorvastatin (LIPITOR) 10 MG tablet    Sig: Take 1 tablet (10 mg total) by mouth daily.    Dispense:  90 tablet    Refill:  3  . cetirizine (ZYRTEC) 10 MG tablet    Sig: TAKE 1 TABLET BY MOUTH AS NEEDED FOR  ALLERGIES    Dispense:  90 tablet    Refill:  1    Please consider 90 day supplies to promote better adherence  . indapamide (LOZOL) 2.5 MG tablet    Sig: Take 1 tablet (2.5 mg total) by mouth daily.    Dispense:  90 tablet    Refill:  2  . losartan (COZAAR) 100 MG tablet    Sig: Take 1 tablet (100 mg total) by mouth daily.    Dispense:  90 tablet    Refill:  1  . spironolactone (ALDACTONE) 25 MG tablet    Sig: Take 1 tablet (25 mg total) by mouth daily.    Dispense:  90 tablet    Refill:  2    Anders Simmonds, MD [facility], PGY-3

## 2017-08-09 ENCOUNTER — Encounter: Payer: Self-pay | Admitting: Family Medicine

## 2017-08-09 ENCOUNTER — Other Ambulatory Visit: Payer: Self-pay

## 2017-08-09 ENCOUNTER — Ambulatory Visit: Payer: BLUE CROSS/BLUE SHIELD | Admitting: Family Medicine

## 2017-08-09 DIAGNOSIS — I10 Essential (primary) hypertension: Secondary | ICD-10-CM

## 2017-08-09 DIAGNOSIS — R058 Other specified cough: Secondary | ICD-10-CM

## 2017-08-09 DIAGNOSIS — R05 Cough: Secondary | ICD-10-CM

## 2017-08-09 MED ORDER — ATORVASTATIN CALCIUM 10 MG PO TABS: 10.0000 mg | ORAL_TABLET | Freq: Every day | ORAL | 3 refills | Status: DC

## 2017-08-09 MED ORDER — SPIRONOLACTONE 25 MG PO TABS: 25.0000 mg | ORAL_TABLET | Freq: Every day | ORAL | 2 refills | Status: DC

## 2017-08-09 MED ORDER — INDAPAMIDE 2.5 MG PO TABS: 2.5000 mg | ORAL_TABLET | Freq: Every day | ORAL | 2 refills | Status: DC

## 2017-08-09 MED ORDER — LOSARTAN POTASSIUM 100 MG PO TABS: 100.0000 mg | ORAL_TABLET | Freq: Every day | ORAL | 1 refills | Status: DC

## 2017-08-09 MED ORDER — CETIRIZINE HCL 10 MG PO TABS: ORAL_TABLET | ORAL | 1 refills | Status: DC

## 2017-08-09 NOTE — Assessment & Plan Note (Signed)
Controlled and at goal.  BP 126/62 today and pulse of 67.  Carvedilol was discontinued 2 weeks ago, her dizziness has improved. -Continue losartan 100 mg daily, spironolactone 25 mg daily, and indapamide 2.5 mg daily -Follow-up in 3 months

## 2017-08-09 NOTE — Patient Instructions (Signed)
Thank you for coming in today, it was so nice to see you! Today we talked about:    Hypertension: Your blood pressure is normal today.  Continue your medications as prescribed.  I have refilled your medications for 90-day supply  Please follow up in 3 months. You can schedule this appointment at the front desk before you leave or call the clinic.  Bring in all your medications or supplements to each appointment for review.   If we ordered any tests today, you will be notified via telephone of any abnormalities. If everything is normal you will get a letter in the mail.   If you have any questions or concerns, please do not hesitate to call the office at [phone]. You can also message me directly via MyChart.   Sincerely,  Anders Simmonds, MD

## 2017-08-12 ENCOUNTER — Telehealth: Payer: Self-pay | Admitting: Pulmonary Disease

## 2017-08-12 DIAGNOSIS — G4733 Obstructive sleep apnea (adult) (pediatric): Secondary | ICD-10-CM | POA: Diagnosis not present

## 2017-08-12 NOTE — Telephone Encounter (Signed)
HST 08/07/17 >> AHI 6, SaO2 low 79%   Will have my nurse inform pt that sleep study shows mild sleep apnea.  Please schedule ROV with me to discuss tx options.  Okay to double book visit.

## 2017-08-13 NOTE — Telephone Encounter (Signed)
Left message for patient to call back for results.  

## 2017-08-13 NOTE — Telephone Encounter (Signed)
Attempted to call pt but no answer.   Left message for pt to return our call x1 

## 2017-08-13 NOTE — Telephone Encounter (Signed)
Pt is returning call. Cb is [phone].

## 2017-08-14 NOTE — Telephone Encounter (Signed)
Patient called back. I have gone over results with the patient, she verbalized understanding. Patient has been scheduled. Double booked per VS

## 2017-08-20 ENCOUNTER — Ambulatory Visit: Payer: BLUE CROSS/BLUE SHIELD | Admitting: Internal Medicine

## 2017-08-21 ENCOUNTER — Encounter: Payer: Self-pay | Admitting: Pulmonary Disease

## 2017-08-21 ENCOUNTER — Ambulatory Visit (INDEPENDENT_AMBULATORY_CARE_PROVIDER_SITE_OTHER): Payer: BLUE CROSS/BLUE SHIELD | Admitting: Pulmonary Disease

## 2017-08-21 VITALS — BP 120/70 | HR 76 | Ht 64.0 in | Wt 229.6 lb

## 2017-08-21 DIAGNOSIS — Z6841 Body Mass Index (BMI) 40.0 and over, adult: Secondary | ICD-10-CM

## 2017-08-21 DIAGNOSIS — G4733 Obstructive sleep apnea (adult) (pediatric): Secondary | ICD-10-CM

## 2017-08-21 NOTE — Progress Notes (Signed)
[facility]  Chief Complaint  Patient presents with  . Follow-up    HST results, reports she has been well since her last visit.     Vital signs: BP 120/70 (BP Location: Left Arm, Cuff Size: Normal)   Pulse 76   Ht _0  (1.626 m)   Wt 229 lb 9.6 oz (104.1 kg)   SpO2 99%   BMI 39.41 kg/m   History of Present Illness: Veronica [name] is a 57 y.o. female with obstructive sleep apnea.  She is here with her husband.  Had sleep study.  Mild sleep apnea.  She is trying to exercise more.  Has referral to nutritionist, but hasn't made appointment yet.  Physical Exam:  General - pleasant Eyes - pupils reactive ENT - no sinus tenderness, no oral exudate, no LAN, over bite, enlarged tongue, MP 4 Cardiac - regular, no murmur Chest - no wheeze, rales Abd - soft, non tender Ext - no edema Skin - no rashes Neuro - normal strength Psych - normal mood  Assessment/Plan:  Obstructive sleep apnea. - We discussed how sleep apnea can affect various health problems, including risks for hypertension, cardiovascular disease, and diabetes.  We also discussed how sleep disruption can increase risks for accidents, such as while driving.  Weight loss as a means of improving sleep apnea was also reviewed.  Additional treatment options discussed were CPAP therapy, oral appliance, and surgical intervention. - she would like to try working on her weight first  Obesity. - reviewed options to help with weight loss - encouraged her to continue with her exercise regimen and f/u with nutritionist   Patient Instructions  Follow up in 6 months    Chesley Mires, MD [place] 08/21/2017, 2:13 PM Pager:  [phone]  Flow Sheet  Sleep tests: PSG 04/16/11 >> AHI 2.5, SpO2 low 90% HST 08/07/17 >> AHI 6, SaO2 low 79%  Past Medical History: She  has a past medical history of Daily headache, Fibromyalgia, History of blood transfusion (2010),  dizziness, Hypertension, Pneumonia (06/2014; 07/2014), Rheumatoid arthritis ([place]), and SLE (systemic lupus erythematosus) ([place]) (1998).  Past Surgical History: She  has a past surgical history that includes Cesarean section (1992, 1995); Cholecystectomy (N/A, 07/18/2012); ERCP (N/A, 07/29/2012); Abdominal hysterectomy (09/2008); Tubal ligation (1995); Incision and drainage (Right, 11/28/2015); and Incision and drainage abscess (Right, 12/02/2015).  Family History: Her family history includes Diabetes in her mother; Hypertension in her brother and father; Multiple sclerosis in her sister.  Social History: She  reports that she has never smoked. She has never used smokeless tobacco. She reports that she does not drink alcohol or use drugs.  Medications: Allergies as of 08/21/2017      Reactions   Amlodipine Swelling   Leg edema   Tessalon [benzonatate] Other (See Comments)   Lip swelling      Medication List        Accurate as of 08/21/17  2:13 PM. Always use your most recent med list.          acetaminophen 500 MG tablet Commonly known as:  TYLENOL Take 500 mg by mouth 2 (two) times daily as needed.   atorvastatin 10 MG tablet Commonly known as:  LIPITOR Take 1 tablet (10 mg total) by mouth daily.   BAYER CONTOUR NEXT USB MONITOR w/Device Kit 1 application by Does not apply route every morning.   cetirizine 10 MG tablet Commonly known as:  ZYRTEC TAKE 1 TABLET BY MOUTH  AS NEEDED FOR  ALLERGIES   FLEXERIL PO Take by mouth 2 (two) times daily as needed.   glucose blood test strip Use as instructed   indapamide 2.5 MG tablet Commonly known as:  LOZOL Take 1 tablet (2.5 mg total) by mouth daily.   losartan 100 MG tablet Commonly known as:  COZAAR Take 1 tablet (100 mg total) by mouth daily.   MICROLET LANCETS Misc 1 application by Does not apply route every morning.   MICROLET NEXT LANCING DEVICE Misc 1 application by Does not apply route every morning.   multivitamin  tablet Take 1 tablet daily by mouth.   spironolactone 25 MG tablet Commonly known as:  ALDACTONE Take 1 tablet (25 mg total) by mouth daily.   traMADol 50 MG tablet Commonly known as:  ULTRAM Take 50 mg by mouth 2 (two) times daily as needed.

## 2017-08-21 NOTE — Patient Instructions (Signed)
Follow up in 6 months 

## 2017-08-27 ENCOUNTER — Other Ambulatory Visit: Payer: Self-pay

## 2017-08-27 ENCOUNTER — Ambulatory Visit (INDEPENDENT_AMBULATORY_CARE_PROVIDER_SITE_OTHER): Payer: BLUE CROSS/BLUE SHIELD | Admitting: Family Medicine

## 2017-08-27 ENCOUNTER — Encounter: Payer: Self-pay | Admitting: Family Medicine

## 2017-08-27 VITALS — BP 116/70 | HR 74 | Temp 98.6°F | Ht 64.0 in | Wt 231.2 lb

## 2017-08-27 DIAGNOSIS — K921 Melena: Secondary | ICD-10-CM

## 2017-08-27 LAB — POCT HEMOGLOBIN: Hemoglobin: 11.4 g/dL — AB (ref 12.2–16.2)

## 2017-08-27 NOTE — Progress Notes (Signed)
Subjective:    Patient ID: Veronica [name] , female   DOB: 1960-12-01 , 57 y.o..   MRN: [id number]  HPI  Veronica [name] is a 57 year old female with past medical history of obesity, hypertension, SLE, hyperlipidemia here for  Chief Complaint  Patient presents with  . Abdominal Pain    1. Abdominal Pain and black stools: Patient notes that she had an episode of abdominal pain, diarrhea, and nausea last week after eating [place] food.  Her symptoms improve over a couple days but then she became constipated.  She continues to have bilateral upper abdominal pain associated with the constipation.  The last 2 days she has been stooling normally however she was concerned because on Monday she had dark black stools.  She notes that on Saturday and Sunday she did take Pepto-Bismol to try to help her stomach.  She is eating normally and drinking normally over the last 5 days.  Review of Systems: Per HPI.   Past Medical History: Patient Active Problem List   Diagnosis Date Noted  . Stress due to marital problems 04/19/2017  . History of prediabetes 04/18/2017  . Stress at home 04/18/2017  . Petechial rash 10/12/2016  . Medication management 06/16/2016  . Dyspareunia in female 06/16/2016  . Hydradenitis 11/28/2015  . Chest pain 09/22/2015  . HLD (hyperlipidemia) 06/14/2015  . Morbid obesity (HCC) 06/14/2015  . Leg swelling 10/10/2014  . Insomnia 12/15/2012  . Essential hypertension, benign 04/10/2012  . SLE (systemic lupus erythematosus) (HCC) 04/10/2012    Medications: reviewed   Social Hx:  reports that she has never smoked. She has never used smokeless tobacco.   Objective:   BP 116/70   Pulse 74   Temp 98.6 F (37 C) (Oral)   Ht 5\' 4"  (1.626 m)   Wt 231 lb 3.2 oz (104.9 kg)   SpO2 98%   BMI 39.69 kg/m  Physical Exam  Gen: NAD, alert, cooperative with exam, well-appearing Cardiac: Regular rate and rhythm, normal S1/S2, no murmur, no edema, capillary refill brisk    Respiratory: Clear to auscultation bilaterally, no wheezes, non-labored breathing Gastrointestinal: soft, non tender, non distended, bowel sounds present Skin: no rashes, normal turgor  Neurological: no gross deficits.  Psych: good insight, normal mood and affect  Results for orders placed or performed in visit on 08/27/17  Hemoglobin  Result Value Ref Range   Hemoglobin 11.4 (A) 12.2 - 16.2 g/dL     Assessment & Plan:   1. Black stool: Patient likely had viral gastroenteritis versus food poisoning, with GI symptoms after eating [place] food.  She is afebrile with a benign abdominal exam today.  She did take Pepto-Bismol and had dark stools afterwards.  Dark stools have since resolved and she had a normal brown bowel movement today.  Point-of-care hemoglobin checked and was 11.4 (stable from prior 11.6).  Dark stools less likely from GI bleed and more likely side effect of Pepto-Bismol. -Continue to monitor stools, if black stools return when not taking Pepto patient will schedule an appointment -If dark stools return, would collect another point-of-care hemoglobin and check FOBT   Anders Simmonds, MD [facility], PGY-3

## 2017-09-12 ENCOUNTER — Encounter: Payer: Self-pay | Admitting: Family Medicine

## 2017-09-12 ENCOUNTER — Ambulatory Visit (INDEPENDENT_AMBULATORY_CARE_PROVIDER_SITE_OTHER): Payer: BLUE CROSS/BLUE SHIELD | Admitting: Family Medicine

## 2017-09-12 DIAGNOSIS — R7303 Prediabetes: Secondary | ICD-10-CM

## 2017-09-12 NOTE — Progress Notes (Signed)
Medical Nutrition Therapy:  Appt start time: 1330 end time:  1430.  Assessment:  Primary concerns today: Weight management and Blood sugar control.   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  She works seven days in a row, including overnight.  Brings either food from home or fast food for most meals.  A1C was 5.7 in March, down from 6.2 in December 2018.     Learning Readiness: Ready  Usual eating pattern includes 2-3 meals and 0-2 snacks per day. Frequent foods and beverages include water, rest/takeout food 3 X wk (mostly [employer] breaded chx sandw, fries, swt tea).  Avoided foods include broc, carrots, tomatoes, berries, and most fruit (disliked).  Eats a lot of salads, spinach, collards, green beans.   Usual physical activity includes 3 X wk at the gym: 30-min exercise class 1 X wk, 30 min TM 2 X wk, and walking her dog 30 min 3 X wk.    24-hr recall: (Up at 6 AM) B ( AM)-   water Snk ( AM)-   --- L (11 PM)-  1 [place] sandwich, chs, let, mustard, 1/4 tsp mayo, 1 oz chips, 4 oz cranberry juice Snk (2 PM)-  1 popsicle, water D (4 PM)-  1 fried pork chop, 1/2 c rice, 1 tbsp gravy, 1/2 c collards, water Snk ( PM)-  --- Typical day? Yes.  Except sometimes she eats breakfast also.    Progress Towards Goal(s):  In progress.   Nutritional Diagnosis:  NI-5.8.3 Inappropriate intake of types of carbohydrates (specify):   As related to  imbalanced meal composition.  As evidenced by inadequate fruits and vegetables.    Intervention:  Nutrition education Handouts given during visit include:   After-Visit Summary (AVS)  Demonstrated degree of understanding via:  Teach Back  Barriers to learning/adherence to lifestyle change: Limited variety of fruits and vegetables liked or consumed.    Monitoring/Evaluation:  Dietary intake, exercise, and body weight in 4 week(s).

## 2017-09-12 NOTE — Patient Instructions (Addendum)
  Diet Recommendations for Pre-diabetes  Carbohydrate includes starch, sugar, and fiber.  Of these, only sugar and starch raise blood glucose.  (Fiber is found in fruits, vegetables [especially skin, seeds, and stalks] and whole grains.)   Starchy (carb) foods: Bread, rice, pasta, potatoes, corn, cereal, grits, crackers, bagels, muffins, all baked goods.  (Fruit, milk, and yogurt also have carbohydrate, but most of these foods will not spike your blood sugar as most starchy foods will.)  A few fruits do cause high blood sugars; use small portions of bananas (limit to 1/2 at a time), grapes, watermelon, oranges, and most tropical fruits. Protein foods: Meat, fish, poultry, eggs, dairy foods, and beans such as pinto and kidney beans (beans also provide carbohydrate).   1. Eat at least REAL 3 meals and 1-2 snacks per day. Never go more than 4-5 hours while awake without eating. Eat breakfast within the first hour of getting up.   2. Limit starchy foods to TWO per meal and ONE per snack. ONE portion of a starchy  food is equal to the following:   - ONE slice of bread (or its equivalent, such as half of a hamburger bun).   - 1/2 cup of a "scoopable" starchy food such as potatoes or rice.   - 15 grams of Total Carbohydrate as shown on food label.   - If you drink ANY sweet beverage, including fruit juice, every 4 oz counts as a carb portion.   3. Include at every meal: a protein food, a carb food, and vegetables and/or fruit.   - Obtain twice the volume of veg's as protein or carbohydrate foods for both lunch and dinner.   - Fresh or frozen veg's are best.   - Keep frozen veg's on hand for a quick vegetable serving.

## 2017-10-08 ENCOUNTER — Ambulatory Visit (INDEPENDENT_AMBULATORY_CARE_PROVIDER_SITE_OTHER): Payer: BLUE CROSS/BLUE SHIELD | Admitting: Family Medicine

## 2017-10-08 ENCOUNTER — Encounter: Payer: Self-pay | Admitting: Family Medicine

## 2017-10-08 DIAGNOSIS — R7303 Prediabetes: Secondary | ICD-10-CM | POA: Diagnosis not present

## 2017-10-08 DIAGNOSIS — Z6835 Body mass index (BMI) 35.0-35.9, adult: Secondary | ICD-10-CM

## 2017-10-08 DIAGNOSIS — Z6838 Body mass index (BMI) 38.0-38.9, adult: Secondary | ICD-10-CM

## 2017-10-08 DIAGNOSIS — E669 Obesity, unspecified: Secondary | ICD-10-CM | POA: Diagnosis not present

## 2017-10-08 NOTE — Progress Notes (Signed)
Medical Nutrition Therapy:  Appt start time: 1000 end time:  1100.  Assessment:  Primary concerns today: Weight management and Blood sugar control.   Veronica [name] said she has been cutting back on bread and biscuits, and she is eating more veg's, including salads.  She clearly has not been limiting carb portions at meals, however; could not remember what we reviewed last time re. how many carb portions/meal are recommended or what makes one portion.  She promised to start paying attention to this after we reviewed yesterday's intake and saw that (a) she ate no veg's yesterday; and (b) dinner provided the equivalent of 6 carbs.    Learning Readiness: Ready  Recent usual eating pattern remains 2 meals and 0-2 snacks per day.  Recent physical activity remains the same: 3 X wk at the gym: 30-min exercise class 1 X wk, 30 min TM 2 X wk, and walking her dog 30 min 3 X wk.    24-hr recall:  (Up at 6 AM) B (8:15 AM)-  1-2 oz country ham, 1 scrmbld egg, 1 slc bread, water Snk ( AM)-  water L ( PM)-  water Snk ( PM)-  --- D (5 PM)-  Beef Burger burger w/ mayo, onion, tom, 6-7 fries, 12 oz swt tea Snk (9 PM)-  1 c Honey Nut Cheerios, 1/2 c whole milk Typical day? Yes.   Estimates she eats a mid-day meal about 2 X wk (Sunday and Thursday).   Progress Towards Goal(s):  In progress.   Nutritional Diagnosis:  Slight progress noted on NI-5.8.3 Inappropriate intake of types of carbohydrates (specify):   As related to  imbalanced meal composition.  As evidenced by increased fruit and vegetables at least on some days.    Intervention:  Nutrition education Handouts given during visit include:   After-Visit Summary (AVS)  Demonstrated degree of understanding via:  Teach Back  Barriers to learning/adherence to lifestyle change: Limited variety of fruits and vegetables liked or consumed.    Monitoring/Evaluation:  Dietary intake, exercise, and body weight in 5 week(s).

## 2017-10-08 NOTE — Patient Instructions (Addendum)
-   By not eating lunch, you are missing out on the opportunity to eat vegetables at this time.   - Fruits and veg's are helpful to managing blood sugar, blood cholesterol levels, as well as appetite (so help in managing weight), and they are powerfull anti-oxidants and anti-inflammatory agents.  THEY ARE GOOD FOR [place].    - Pay closer attention to the time of day.  Try to eat SOMEthing at least every 5 hours while awake.   - ALSO pay attention to the number of carb portions you have at each meal.    From your last nutrition appt:  - Diet Recommendations for Diabetes  Carbohydrate includes starch, sugar, and fiber.  Of these, only sugar and starch raise blood glucose.  (Fiber is found in fruits, vegetables [especially skin, seeds, and stalks] and whole grains.)   Starchy (carb) foods: Bread, rice, pasta, potatoes, corn, cereal, grits, crackers, bagels, muffins, all baked goods.  (Fruit, milk, and yogurt also have carbohydrate, but most of these foods will not spike your blood sugar as most starchy foods will.)  A few fruits do cause high blood sugars; use small portions of bananas (limit to 1/2 at a time), grapes, watermelon, oranges, and most tropical fruits.   Protein foods: Meat, fish, poultry, eggs, dairy foods, and beans such as pinto and kidney beans (beans also provide carbohydrate).   1. Eat at least REAL 3 meals and 1-2 snacks per day. Never go more than 4-5 hours while awake without eating. Eat breakfast within the first hour of getting up.   2. Limit starchy foods to TWO per meal and ONE per snack. ONE portion of a starchy  food is equal to the following:   - ONE slice of bread (or its equivalent, such as half of a hamburger bun).   - 1/2 cup of a "scoopable" starchy food such as potatoes or rice.   - 15 grams of Total Carbohydrate as shown on food label.   - Remember also that every 4 oz of any sweet drink (including juice) is one carb portion.   3. Include at every meal: a protein food, a  carb food, and vegetables and/or fruit.   - Obtain twice the volume of veg's as protein or carbohydrate foods for both lunch and dinner.   - Fresh or frozen veg's are best.   - Keep frozen veg's on hand for a quick vegetable serving.

## 2017-11-12 ENCOUNTER — Ambulatory Visit: Payer: BLUE CROSS/BLUE SHIELD | Admitting: Family Medicine

## 2017-12-10 ENCOUNTER — Encounter: Payer: Self-pay | Admitting: Family Medicine

## 2017-12-10 ENCOUNTER — Ambulatory Visit (INDEPENDENT_AMBULATORY_CARE_PROVIDER_SITE_OTHER): Payer: BLUE CROSS/BLUE SHIELD | Admitting: Family Medicine

## 2017-12-10 DIAGNOSIS — E669 Obesity, unspecified: Secondary | ICD-10-CM | POA: Diagnosis not present

## 2017-12-10 DIAGNOSIS — R7303 Prediabetes: Secondary | ICD-10-CM | POA: Diagnosis not present

## 2017-12-10 DIAGNOSIS — Z6838 Body mass index (BMI) 38.0-38.9, adult: Secondary | ICD-10-CM | POA: Diagnosis not present

## 2017-12-10 NOTE — Patient Instructions (Addendum)
-   Please schedule an appt with your new Primary Care Physician (PCP) Dr. [name] for follow-up.   - WHEN YOU BRING THIS PRINT-OUT HOME TODAY, RE-READ THIS!  And keep this in a place where you see it, and re-read it at least once a week until you REMEMBER the instructions below.    When you get takeout that you bring home, you have the option of adding a vegetable and/or fruit.    On your vacation to [place] [place] [facility] next month, think about how you can maintain some nutritional balance in your meals.    From your last nutrition appt:  - Diet Recommendations for Diabetes  Carbohydrate includes starch, sugar, and fiber.  Of these, only sugar and starch raise blood glucose.  (Fiber is found in fruits, vegetables [especially skin, seeds, and stalks] and whole grains.)   Starchy (carb) foods: Bread, rice, pasta, potatoes, corn, cereal, grits, crackers, bagels, muffins, all baked goods.  (Fruit, milk, and yogurt also have carbohydrate, but most of these foods will not spike your blood sugar as most starchy foods will.)  A few fruits do cause high blood sugars; use small portions of bananas (limit to 1/2 at a time), grapes, watermelon, oranges, and most tropical fruits.   Protein foods: Meat, fish, poultry, eggs, dairy foods, and beans such as pinto and kidney beans (beans also provide carbohydrate).   1. Eat at least REAL 3 meals and 1-2 snacks per day. Never go more than 4-5 hours while awake without eating. Eat breakfast within the first hour of getting up.   2. Limit starchy foods to TWO per meal and ONE per snack. ONE portion of a starchy  food is equal to the following:              - ONE slice of bread (or its equivalent, such as half of a hamburger bun).              - 1/2 cup of a "scoopable" starchy food such as potatoes or rice.              - 15 grams of Total Carbohydrate as shown on food label.              - Remember also that every 4 oz of any sweet drink (including juice) is one carb  portion.   3. Include at every meal: a protein food, a carb food, and vegetables and/or fruit.              - Obtain twice the volume of veg's as protein or carbohydrate foods for both lunch and dinner.              - Fresh or frozen veg's are best.               - Keep frozen veg's on hand for a quick vegetable serving.     - Definitions: - High-glycemic means "raises blood sugar well" - Low-glycemic means "does not raise blood sugar so much"  (Most fruits are low- or moderate-glycemic, with the exceptions shown above (tropical fruits, oranges, grapes, watermelon.)

## 2017-12-10 NOTE — Progress Notes (Signed)
Medical Nutrition Therapy:  Appt start time: 1000 end time:  1100.  Assessment:  Primary concerns today: Weight management and Blood sugar control.   Veronica [name]'s A1C went from 6.2 in Dec to 5.7 in March, so she has not been checking BG levels.  She was surprised to see she had lost weight b/c she feels she has not been making great food choices.  For example, she brought a package of cookies to work with her last month, and ate all of them over the course of the week.  She is not sure what was behind her decision to buy the cookies in the first place.    Lexis has still not paid close attention to her dietary recommendations; did not remember that these include getting up to two starch portions per meal (she thought it was two per day).  Said she will post today's print-out on the refrigerator, and agreed to re-read it at least once weekly until she can remember the details.    We talked about possibilities of veg's that would go with a sandwich (in lieu of chips).  Era said she might be willing to try some raw veg's she has either not eaten before, or hasn't tried in years, i.e., cucumbers.    She will be doing no exercise for the next two weeks per rheumatologist b/c knee pain, but she has been pretty consistent in her exercise (see below).    Learning Readiness: Ready  Recent usual eating pattern remains 2 meals and 0-2 snacks per day.  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    24-hr recall:  (Up at 6 AM; drank some water) B ( AM)-  water Snk ( AM)-  water L (1:30 PM)-  1 [employer] [place] club (mayo, let, tom, bacon), chips, water Snk (5:30)-  1 apple D (6:30 PM)-  6-8 oz steak, water Snk (9:30)-  1/2 bag microwave butter popcorn, water  Typical day? Yes.    Progress Towards Goal(s):  In progress.   Nutritional Diagnosis:  No progress on NI-5.8.3  Inappropriate intake of types of carbohydrates (specify):   As related to  imbalanced meal composition.  As evidenced by no fruit or vegetable intake yesterday.    Intervention:  Nutrition education  Handouts given during visit include:  After-Visit Summary (AVS)  Demonstrated degree of understanding via:  Teach Back  Barriers to learning/adherence to lifestyle change: Limited variety of fruits and vegetables liked or consumed.    Monitoring/Evaluation:  Dietary intake, exercise, and body weight in 5 week(s).

## 2017-12-20 ENCOUNTER — Encounter: Payer: Self-pay | Admitting: Family Medicine

## 2017-12-20 ENCOUNTER — Ambulatory Visit: Payer: BLUE CROSS/BLUE SHIELD | Admitting: Family Medicine

## 2017-12-20 VITALS — BP 128/72 | HR 66 | Temp 98.2°F | Wt 225.6 lb

## 2017-12-20 DIAGNOSIS — M329 Systemic lupus erythematosus, unspecified: Secondary | ICD-10-CM

## 2017-12-20 DIAGNOSIS — N898 Other specified noninflammatory disorders of vagina: Secondary | ICD-10-CM | POA: Diagnosis not present

## 2017-12-20 DIAGNOSIS — Z1239 Encounter for other screening for malignant neoplasm of breast: Secondary | ICD-10-CM

## 2017-12-20 DIAGNOSIS — I10 Essential (primary) hypertension: Secondary | ICD-10-CM

## 2017-12-20 DIAGNOSIS — R232 Flushing: Secondary | ICD-10-CM

## 2017-12-20 DIAGNOSIS — Z87898 Personal history of other specified conditions: Secondary | ICD-10-CM | POA: Diagnosis not present

## 2017-12-20 DIAGNOSIS — N941 Unspecified dyspareunia: Secondary | ICD-10-CM

## 2017-12-20 DIAGNOSIS — Z1231 Encounter for screening mammogram for malignant neoplasm of breast: Secondary | ICD-10-CM

## 2017-12-20 DIAGNOSIS — A599 Trichomoniasis, unspecified: Secondary | ICD-10-CM

## 2017-12-20 LAB — POCT WET PREP (WET MOUNT): Clue Cells Wet Prep Whiff POC: NEGATIVE

## 2017-12-20 LAB — POCT GLYCOSYLATED HEMOGLOBIN (HGB A1C): HbA1c, POC (controlled diabetic range): 5.5 % (ref 0.0–7.0)

## 2017-12-20 MED ORDER — HYDROXYCHLOROQUINE SULFATE 200 MG PO TABS: 200.0000 mg | ORAL_TABLET | Freq: Every day | ORAL | Status: DC

## 2017-12-20 MED ORDER — METRONIDAZOLE 500 MG PO TABS: 2000.0000 mg | ORAL_TABLET | Freq: Once | ORAL | 0 refills | Status: AC

## 2017-12-20 NOTE — Assessment & Plan Note (Signed)
Follows with Rheumatology. On Plaquenil therapy.

## 2017-12-20 NOTE — Progress Notes (Signed)
Patient Name: Veronica [name] Date of Birth: 10/08/1960 Date of Visit: 12/20/17 PCP: Westley Chandler, MD  Chief Complaint: vaginal discharge  Subjective: Veronica [name] is a pleasant 57 y.o. year old female with medical history significant for HTN, SLE, morbid obesity, and prediabetes  presenting today for vaginal discharge and follow up.   She reports a several week history of vaginal discharge. She reports she has had intermittent discharge for sometime. She tried OTC miconazole. Denies fevers, dysuria, or odor to discharge. Sexually active with husband.  [name] reports intermittent hot flashes, as well as dyspareunia.  She reports she feels some vaginal dryness when she is having intercourse with her husband.  She had a hysterectomy many years ago.  She is uncertain when her mother entered menopause.  Her hysterectomy was for benign reasons.  She has a history of prediabetes.  She has been working on diet and weight loss.  Her A1c today is 5.5.  She has a history of hypertension.  She is due for blood work for her hypertension.  The patient reports she is due for mammogram and would like an order. ROS:  ROS  Denies chest pain, shortness of breath, worsening lower extremity edema, weight loss, or fevers.   I have reviewed the patient's medical, surgical, family, and social history as appropriate.   Vitals:   12/20/17 1006  BP: 128/72  Pulse: 66  Temp: 98.2 F (36.8 C)  SpO2: 99%   Filed Weights   12/20/17 1006  Weight: 225 lb 9.6 oz (102.3 kg)   HEENT: Sclera anicteric. Dentition is moderate. Appears well hydrated. Neck: Supple Cardiac: Regular rate and rhythm. Normal S1/S2. No murmurs, rubs, or gallops appreciated. Lungs: Clear bilaterally to ascultation.  Abdomen: Normoactive bowel sounds. No tenderness to deep or light palpation. No rebound or guarding.  Extremities: Warm, well perfused without edema.  Skin: Warm, dry Psych: Pleasant and appropriate   GU  chaperoned exam.  Normal-appearing external female genitalia.  Vaginal wall has excessive white discharge present.  No ulcers or lesions present.  Diagnoses and all orders for this visit:  History of prediabetes, congratulated her as this is now below the range of prediabetes -     HgB A1c -     Lipid Panel  Essential hypertension at goal today -     Basic Metabolic Panel  Hot flashes suspect these are due to the onset of menopause along with vaginal dryness and dyspareunia.  Also must consider thyroid dysfunction -     Follicle Stimulating Hormone  Screening for breast cancer, we discussed the benefits of mammogram as well as the risk for false positive and need for biopsy. -     MM Digital Screening; Future  Vaginal discharge -     POCT Wet Prep [facility])  Dyspareunia, female discussed use of over-the-counter cleanser called Replens  Trichomoniasis discussed this at length including need to treat her husband.  Recommend and encourage testing for HIV and other sexually transmitted infections at follow-up discussed with patient today we will plan for testing at follow-up.  This is likely the cause of her vaginal discharge as this was on her wet mount. -     metroNIDAZOLE (FLAGYL) 500 MG tablet; Take 4 tablets (2,000 mg total) by mouth once for 1 dose.  [name], MD  Family Medicine Teaching Service

## 2017-12-20 NOTE — Patient Instructions (Addendum)
Try Replens for your vaginal symptoms  If this persists, we will talk about other options   I sent a prescription for the discharge to your pharmacy--- your partner should be treated as well

## 2017-12-20 NOTE — Assessment & Plan Note (Signed)
A1C 5.5 today, patient working hard on diet.

## 2017-12-20 NOTE — Assessment & Plan Note (Signed)
Recommended Replens, suspect due to menopause.

## 2017-12-21 LAB — LIPID PANEL
Chol/HDL Ratio: 2.9 ratio (ref 0.0–4.4)
Cholesterol, Total: 125 mg/dL (ref 100–199)
HDL: 43 mg/dL (ref >39)
LDL Calculated: 69 mg/dL (ref 0–99)
Triglycerides: 63 mg/dL (ref 0–149)
VLDL Cholesterol Cal: 13 mg/dL (ref 5–40)

## 2017-12-21 LAB — BASIC METABOLIC PANEL
BUN/Creatinine Ratio: 14 (ref 9–23)
BUN: 12 mg/dL (ref 6–24)
CO2: 26 mmol/L (ref 20–29)
Calcium: 10 mg/dL (ref 8.7–10.2)
Chloride: 99 mmol/L (ref 96–106)
Creatinine, Ser: 0.86 mg/dL (ref 0.57–1.00)
GFR calc Af Amer: 89 mL/min/{1.73_m2} (ref >59)
GFR calc non Af Amer: 77 mL/min/{1.73_m2} (ref >59)
Glucose: 99 mg/dL (ref 65–99)
Potassium: 4.1 mmol/L (ref 3.5–5.2)
Sodium: 138 mmol/L (ref 134–144)

## 2017-12-21 LAB — FOLLICLE STIMULATING HORMONE: FSH: 61.1 m[IU]/mL

## 2018-01-14 ENCOUNTER — Ambulatory Visit: Payer: BLUE CROSS/BLUE SHIELD | Admitting: Family Medicine

## 2018-01-28 ENCOUNTER — Ambulatory Visit (INDEPENDENT_AMBULATORY_CARE_PROVIDER_SITE_OTHER): Payer: BLUE CROSS/BLUE SHIELD | Admitting: Family Medicine

## 2018-01-28 ENCOUNTER — Encounter: Payer: Self-pay | Admitting: Family Medicine

## 2018-01-28 DIAGNOSIS — R7303 Prediabetes: Secondary | ICD-10-CM

## 2018-01-28 DIAGNOSIS — Z6838 Body mass index (BMI) 38.0-38.9, adult: Secondary | ICD-10-CM | POA: Diagnosis not present

## 2018-01-28 DIAGNOSIS — E669 Obesity, unspecified: Secondary | ICD-10-CM | POA: Diagnosis not present

## 2018-01-28 NOTE — Patient Instructions (Addendum)
-   The foods that are likely to bother you since you had your gall bladder out are those with a lot of fat.  It will be helpful to you to limit intake of foods like bacon, sausage, fried foods, butter, mayo, or other sources of fat.  You may be able to eat some of these foods if you limit the amount and do not include other fatty foods at the same time.    - If you are not eating a dinner meal most days, then this eliminates an opportunity for vegetables.    Specific Food Goals: 1. Bring dinner to work at least 3 days a week.  These meals are to include some source of a protein food (chicken, fish, [place], peanut butter, beans) and some vegetables (salad or any frozen veg you can keep at work), and a limited amount of starchy foods (optional; bread, crackers, corn, potatoes, rice, pasta).    - Starchy foods you want to limit to: No more than TWO portions per meal.  One portion is equal to 1/2 cup of scoopable carb foods, 1 slice of bread, or 15 grams of carb as shown on label).   2. Buy healthier snack foods for both work and home.    - Fresh fruit, apple sauce, yogurt, granola, mixed nuts and dried fruit.  3. Continue exercise routine that includes twice weekly dancing, 30-min exercise class with personal trainer, and walking your dog 3 X week.    Call Dr. Gerilyn Pilgrim if you would like another appt:  [phone].

## 2018-01-28 NOTE — Progress Notes (Signed)
Medical Nutrition Therapy:  Appt start time: 1000 end time:  1100.  Assessment:  Primary concerns today: Weight management and Blood sugar control.   Veronica [name] had a 4-day trip to [facility] last week.  She didn't eat too much, fearful of getting sick from fatty foods (related to her cholecystectomy).  She got back at 2 AM yesterday, so we did not 24-hr recall b/c of yesterday being an unusual day for her.    Veronica [name] has maintained her exercise pretty well, although she sometimes does not get each of her 3 times a week walking the dog.  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  She works for a 24/7 week, then off for a full week, working every other week.  A typical work day includes an 2 scmrbled eggs, 2 sausage patties, and apple sauce for breakfast, BK chx sandwich, fries, and 16 oz tea/soda for lunch, and popcorn, fruit, and trail mix in place of dinner.  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    No 24-hr recall b/c yesterday was an atypical day.    Progress Towards Goal(s):  In progress.   Nutritional Diagnosis:  No meaningful progress on NI-5.8.3 Inappropriate intake of types of carbohydrates (specify):   As related to  imbalanced meal composition.  As evidenced by snack foods in place of dinner as well as fast food lunches (burger, fries, soda) on most work days .    Intervention:  Nutrition education  Handouts given during visit include:  After-Visit Summary (AVS)  Demonstrated degree of understanding via:  Teach Back  Barriers to learning/adherence to lifestyle change: Limited variety of fruits and vegetables liked or consumed, and pretty rigid beliefs  about what she can/wants to change.    Monitoring/Evaluation:  Dietary intake, exercise, and body weight prn.  I believe she would benefit by more dietary guidance; however, Veronica [name] feels she understands the dietary changes that will most help her.

## 2018-02-18 ENCOUNTER — Other Ambulatory Visit (HOSPITAL_COMMUNITY)
Admission: RE | Admit: 2018-02-18 | Discharge: 2018-02-18 | Disposition: A | Discharge status: Home / Self Care | Payer: BLUE CROSS/BLUE SHIELD | Source: Ambulatory Visit | Attending: Family Medicine | Admitting: Family Medicine

## 2018-02-18 ENCOUNTER — Ambulatory Visit: Payer: BLUE CROSS/BLUE SHIELD | Admitting: Family Medicine

## 2018-02-18 ENCOUNTER — Other Ambulatory Visit: Payer: Self-pay

## 2018-02-18 VITALS — BP 118/62 | HR 68 | Temp 98.9°F | Ht 64.0 in | Wt 225.8 lb

## 2018-02-18 DIAGNOSIS — N898 Other specified noninflammatory disorders of vagina: Secondary | ICD-10-CM | POA: Diagnosis not present

## 2018-02-18 DIAGNOSIS — Z111 Encounter for screening for respiratory tuberculosis: Secondary | ICD-10-CM

## 2018-02-18 DIAGNOSIS — Z23 Encounter for immunization: Secondary | ICD-10-CM

## 2018-02-18 DIAGNOSIS — N760 Acute vaginitis: Secondary | ICD-10-CM

## 2018-02-18 DIAGNOSIS — Z202 Contact with and (suspected) exposure to infections with a predominantly sexual mode of transmission: Secondary | ICD-10-CM

## 2018-02-18 DIAGNOSIS — A599 Trichomoniasis, unspecified: Secondary | ICD-10-CM

## 2018-02-18 DIAGNOSIS — B9689 Other specified bacterial agents as the cause of diseases classified elsewhere: Secondary | ICD-10-CM

## 2018-02-18 LAB — POCT WET PREP (WET MOUNT)
Clue Cells Wet Prep Whiff POC: POSITIVE
WBC, Wet Prep HPF POC: >20

## 2018-02-18 MED ORDER — METRONIDAZOLE 500 MG PO TABS: 2000.0000 mg | ORAL_TABLET | Freq: Once | ORAL | 0 refills | Status: AC

## 2018-02-18 MED ORDER — METRONIDAZOLE 500 MG PO TABS: 500.0000 mg | ORAL_TABLET | Freq: Two times a day (BID) | ORAL | 0 refills | Status: AC

## 2018-02-18 MED ORDER — METRONIDAZOLE 500 MG PO TABS: 2000.0000 mg | ORAL_TABLET | Freq: Once | ORAL | 0 refills | Status: DC

## 2018-02-18 MED ORDER — METRONIDAZOLE 500 MG PO TABS: 500.0000 mg | ORAL_TABLET | Freq: Two times a day (BID) | ORAL | 0 refills | Status: DC

## 2018-02-18 NOTE — Progress Notes (Signed)
Subjective:  Veronica [name] is a 57 y.o. female who presents to the [facility] today with a chief complaint of vaginal discharge and burning.   HPI:  Patient had vaginal discharge and burning for the past several days.  Is getting worse.  Patient was seen approximately 3 months ago for well woman visit where she was found to have trichomonas on routine Pap.  Patient endorses being in a monogamous relationship for the past 20 years.  Denies condoms with sex.  Sexually active with her husband only.  She completed course for her trichomoniasis, however her partner never did receive treatment.  Wet prep and GC chlamydia testing done today.  Patient was found to be positive trichomoniasis.  Discussed with patient the need to use barrier protection when using sex to prevent STDs.  Also discussed with her the need to get testing for additional STI including HIV and syphilis.  Patient endorsed understanding of this.  ROS: Per HPI  PMH: Smoking history reviewed.    Objective:  Physical Exam: BP 118/62   Pulse 68   Temp 98.9 F (37.2 C) (Oral)   Ht 5\' 4"  (1.626 m)   Wt 225 lb 12.8 oz (102.4 kg)   SpO2 98%   BMI 38.76 kg/m   Gen: NAD, resting comfortably CV: RRR with no murmurs appreciated Pulm: NWOB, CTAB with no crackles, wheezes, or rhonchi GI: Normal bowel sounds present. Soft, Nontender, Nondistended. MSK: no edema, cyanosis, or clubbing noted Skin: warm, dry Neuro: grossly normal, moves all extremities Psych: Normal affect and thought content  Results for orders placed or performed in visit on 02/18/18 (from the past 72 hour(s))  HIV antibody (with reflex)     Status: None   Collection Time: 02/18/18 12:26 PM  Result Value Ref Range   HIV Screen 4th Generation wRfx Non Reactive Non Reactive  RPR     Status: None   Collection Time: 02/18/18 12:26 PM  Result Value Ref Range   RPR Ser Ql Non Reactive Non Reactive  PPD     Status: None   Collection Time: 02/20/18  2:48 PM  Result  Value Ref Range   TB Skin Test Negative    Induration 0 mm   Microscopic wet-mount exam shows clue cells, excessive bacteria, trichomonads.   Assessment/Plan:  Trichimoniasis Positive for trichomonas.  Will treat with 2 g of metronidazole.  Stressed the patient need to have her partner tested.  Will need to use condoms to prevent any further STI.  We will also test for HIV and syphilis given testing positive for trichomoniasis x2.  Patient return within 2 months for test of cure.  Bacterial vaginosis As evidenced on wet prep.  Will treat for 6 days additional metronidazole top of that which was used for trichomoniasis.  Screening-pulmonary TB Need to be obtained for work.  Not related to STI work-up.    Lab Orders     HIV antibody (with reflex)     RPR     POCT Wet Prep 02/22/18)     PPD  Meds ordered this encounter  Medications  . DISCONTD: metroNIDAZOLE (FLAGYL) 500 MG tablet    Sig: Take 4 tablets (2,000 mg total) by mouth once for 1 dose.    Dispense:  4 tablet    Refill:  0  . DISCONTD: metroNIDAZOLE (FLAGYL) 500 MG tablet    Sig: Take 1 tablet (500 mg total) by mouth 2 (two) times daily for 6 days.  Dispense:  12 tablet    Refill:  0  . metroNIDAZOLE (FLAGYL) 500 MG tablet    Sig: Take 1 tablet (500 mg total) by mouth 2 (two) times daily for 6 days.    Dispense:  12 tablet    Refill:  0  . metroNIDAZOLE (FLAGYL) 500 MG tablet    Sig: Take 4 tablets (2,000 mg total) by mouth once for 1 dose.    Dispense:  4 tablet    Refill:  0      Thomes Dinning, MD, MS FAMILY MEDICINE RESIDENT - PGY2 02/21/2018 12:11 PM

## 2018-02-18 NOTE — Patient Instructions (Signed)

## 2018-02-19 LAB — RPR: RPR Ser Ql: NONREACTIVE

## 2018-02-19 LAB — HIV ANTIBODY (ROUTINE TESTING W REFLEX): HIV Screen 4th Generation wRfx: NONREACTIVE

## 2018-02-19 LAB — CERVICOVAGINAL ANCILLARY ONLY
Chlamydia: NEGATIVE
Neisseria Gonorrhea: NEGATIVE

## 2018-02-20 ENCOUNTER — Encounter: Payer: Self-pay | Admitting: Family Medicine

## 2018-02-20 ENCOUNTER — Ambulatory Visit (INDEPENDENT_AMBULATORY_CARE_PROVIDER_SITE_OTHER): Payer: BLUE CROSS/BLUE SHIELD | Admitting: *Deleted

## 2018-02-20 DIAGNOSIS — Z111 Encounter for screening for respiratory tuberculosis: Secondary | ICD-10-CM | POA: Diagnosis not present

## 2018-02-20 LAB — TB SKIN TEST
Induration: 0 mm
TB Skin Test: NEGATIVE

## 2018-02-20 NOTE — Progress Notes (Signed)
Patient is here for a PPD read.  It was placed on 02/18/2018 in the left forearm @ 12:35 pm.    PPD RESULTS:  Result: Negative  Induration: 0 mm  Letter created and given to patient for documentation purposes. Fleeger, Maryjo Rochester, CMA

## 2018-02-21 DIAGNOSIS — Z111 Encounter for screening for respiratory tuberculosis: Secondary | ICD-10-CM | POA: Insufficient documentation

## 2018-02-21 DIAGNOSIS — A599 Trichomoniasis, unspecified: Secondary | ICD-10-CM | POA: Insufficient documentation

## 2018-02-21 DIAGNOSIS — N760 Acute vaginitis: Secondary | ICD-10-CM | POA: Insufficient documentation

## 2018-02-21 DIAGNOSIS — B9689 Other specified bacterial agents as the cause of diseases classified elsewhere: Secondary | ICD-10-CM | POA: Insufficient documentation

## 2018-02-21 NOTE — Assessment & Plan Note (Signed)
Need to be obtained for work.  Not related to STI work-up.

## 2018-02-21 NOTE — Assessment & Plan Note (Signed)
As evidenced on wet prep.  Will treat for 6 days additional metronidazole top of that which was used for trichomoniasis.

## 2018-02-21 NOTE — Assessment & Plan Note (Signed)
Positive for trichomonas.  Will treat with 2 g of metronidazole.  Stressed the patient need to have her partner tested.  Will need to use condoms to prevent any further STI.  We will also test for HIV and syphilis given testing positive for trichomoniasis x2.  Patient return within 2 months for test of cure.

## 2018-03-03 ENCOUNTER — Ambulatory Visit: Payer: BLUE CROSS/BLUE SHIELD | Admitting: Family Medicine

## 2018-03-17 ENCOUNTER — Ambulatory Visit: Payer: BLUE CROSS/BLUE SHIELD | Admitting: Family Medicine

## 2018-03-21 ENCOUNTER — Other Ambulatory Visit: Payer: Self-pay

## 2018-03-21 MED ORDER — LOSARTAN POTASSIUM 100 MG PO TABS: 100.0000 mg | ORAL_TABLET | Freq: Every day | ORAL | 1 refills | Status: DC

## 2018-03-24 ENCOUNTER — Ambulatory Visit: Payer: BLUE CROSS/BLUE SHIELD | Admitting: Family Medicine

## 2018-04-01 ENCOUNTER — Encounter: Payer: Self-pay | Admitting: Family Medicine

## 2018-04-01 ENCOUNTER — Other Ambulatory Visit: Payer: Self-pay

## 2018-04-01 ENCOUNTER — Ambulatory Visit: Payer: BLUE CROSS/BLUE SHIELD | Admitting: Family Medicine

## 2018-04-01 VITALS — BP 140/80 | HR 63 | Temp 97.7°F | Wt 228.6 lb

## 2018-04-01 DIAGNOSIS — G4700 Insomnia, unspecified: Secondary | ICD-10-CM | POA: Diagnosis not present

## 2018-04-01 DIAGNOSIS — R519 Headache, unspecified: Secondary | ICD-10-CM

## 2018-04-01 DIAGNOSIS — R51 Headache: Secondary | ICD-10-CM | POA: Diagnosis not present

## 2018-04-01 DIAGNOSIS — I10 Essential (primary) hypertension: Secondary | ICD-10-CM

## 2018-04-01 NOTE — Patient Instructions (Signed)
Naproxen- can elevated your blood pressure  Call to schedule your sleep study  It was wonderful to see you today.  Thank you for choosing [facility].   Please call [phone] with any questions about today's appointment.  Please be sure to schedule follow up at the front  desk before you leave today.   [name], MD  Family Medicine        Mediterranean Diet  Why follow it? Research shows. . Those who follow the Mediterranean diet have a reduced risk of heart disease  . The diet is associated with a reduced incidence of Parkinson's and Alzheimer's diseases . People following the diet may have longer life expectancies and lower rates of chronic diseases  . The Dietary Guidelines for Americans recommends the Mediterranean diet as an eating plan to promote health and prevent disease  What Is the Mediterranean Diet?  . Healthy eating plan based on typical foods and recipes of Mediterranean-style cooking . The diet is primarily a plant based diet; these foods should make up a majority of meals   Starches - Plant based foods should make up a majority of meals - They are an important sources of vitamins, minerals, energy, antioxidants, and fiber - Choose whole grains, foods high in fiber and minimally processed items  - Typical grain sources include wheat, oats, barley, corn, brown rice, bulgar, farro, millet, polenta, couscous  - Various types of beans include chickpeas, lentils, fava beans, black beans, white beans   Fruits  Veggies - Large quantities of antioxidant rich fruits & veggies; 6 or more servings  - Vegetables can be eaten raw or lightly drizzled with oil and cooked  - Vegetables common to the traditional Mediterranean Diet include: artichokes, arugula, beets, broccoli, brussel sprouts, cabbage, carrots, celery, collard greens, cucumbers, eggplant, kale, leeks, lemons, lettuce, mushrooms, okra, onions, peas, peppers, potatoes, pumpkin, radishes,  rutabaga, shallots, spinach, sweet potatoes, turnips, zucchini - Fruits common to the Mediterranean Diet include: apples, apricots, avocados, cherries, clementines, dates, figs, grapefruits, grapes, melons, nectarines, oranges, peaches, pears, pomegranates, strawberries, tangerines  Fats - Replace butter and margarine with healthy oils, such as olive oil, canola oil, and tahini  - Limit nuts to no more than a handful a day  - Nuts include walnuts, almonds, pecans, pistachios, pine nuts  - Limit or avoid candied, honey roasted or heavily salted nuts - Olives are central to the [employer] - can be eaten whole or used in a variety of dishes   Meats Protein - Limiting red meat: no more than a few times a month - When eating red meat: choose lean cuts and keep the portion to the size of deck of cards - Eggs: approx. 0 to 4 times a week  - Fish and lean poultry: at least 2 a week  - Healthy protein sources include, chicken, [place], lean beef, lamb - Increase intake of seafood such as tuna, salmon, trout, mackerel, shrimp, scallops - Avoid or limit high fat processed meats such as sausage and bacon  Dairy - Include moderate amounts of low fat dairy products  - Focus on healthy dairy such as fat free yogurt, skim milk, low or reduced fat cheese - Limit dairy products higher in fat such as whole or 2% milk, cheese, ice cream  Alcohol - Moderate amounts of red wine is ok  - No more than 5 oz daily for women (all ages) and men older than age 11  - No more than 10 oz  of wine daily for men younger than 72  Other - Limit sweets and other desserts  - Use herbs and spices instead of salt to flavor foods  - Herbs and spices common to the traditional Mediterranean Diet include: basil, bay leaves, chives, cloves, cumin, fennel, garlic, lavender, marjoram, mint, oregano, parsley, pepper, rosemary, sage, savory, sumac, tarragon, thyme   It's not just a diet, it's a lifestyle:  . The Mediterranean diet  includes lifestyle factors typical of those in the region  . Foods, drinks and meals are best eaten with others and savored . Daily physical activity is important for overall good health . This could be strenuous exercise like running and aerobics . This could also be more leisurely activities such as walking, housework, yard-work, or taking the stairs . Moderation is the key; a balanced and healthy diet accommodates most foods and drinks . Consider portion sizes and frequency of consumption of certain foods   Meal Ideas & Options:  . Breakfast:  o Whole wheat toast or whole wheat English muffins with peanut butter & hard boiled egg o Steel cut oats topped with apples & cinnamon and skim milk  o Fresh fruit: banana, strawberries, melon, berries, peaches  o Smoothies: strawberries, bananas, greek yogurt, peanut butter o Low fat greek yogurt with blueberries and granola  o Egg white omelet with spinach and mushrooms o Breakfast couscous: whole wheat couscous, apricots, skim milk, cranberries  . Sandwiches:  o Hummus and grilled vegetables (peppers, zucchini, squash) on whole wheat bread   o Grilled chicken on whole wheat pita with lettuce, tomatoes, cucumbers or tzatziki  o Tuna salad on whole wheat bread: tuna salad made with greek yogurt, olives, red peppers, capers, green onions o Garlic rosemary lamb pita: lamb sauted with garlic, rosemary, salt & pepper; add lettuce, cucumber, greek yogurt to pita - flavor with lemon juice and black pepper  . Seafood:  o Mediterranean grilled salmon, seasoned with garlic, basil, parsley, lemon juice and black pepper o Shrimp, lemon, and spinach whole-grain pasta salad made with low fat greek yogurt  o Seared scallops with lemon orzo  o Seared tuna steaks seasoned salt, pepper, coriander topped with tomato mixture of olives, tomatoes, olive oil, minced garlic, parsley, green onions and cappers  . Meats:  o Herbed greek chicken salad with kalamata  olives, cucumber, feta  o Red bell peppers stuffed with spinach, bulgur, lean ground beef (or lentils) & topped with feta   o Kebabs: skewers of chicken, tomatoes, onions, zucchini, squash  o [place] burgers: made with red onions, mint, dill, lemon juice, feta cheese topped with roasted red peppers . Vegetarian o Cucumber salad: cucumbers, artichoke hearts, celery, red onion, feta cheese, tossed in olive oil & lemon juice  o Hummus and whole grain pita points with a greek salad (lettuce, tomato, feta, olives, cucumbers, red onion) o Lentil soup with celery, carrots made with vegetable broth, garlic, salt and pepper  o Tabouli salad: parsley, bulgur, mint, scallions, cucumbers, tomato, radishes, lemon juice, olive oil, salt and pepper.

## 2018-04-01 NOTE — Progress Notes (Signed)
Patient Name: ROMEESA [name] Date of Birth: [birth year] Date of Visit: 04/01/18 PCP: Westley Chandler, MD  Chief Complaint: follow up headaches, sleep   Subjective: Veronica [name] is a pleasant 57 y.o. year old woman with history of SLE, obesity, hypertension, and allergic rhinitis presenting today for follow up from headaches and sleep.  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  She reports her room is relatively quiet and dark, her cell phone is Dr. bed.  She frequently tries to play games before she goes to sleep.  She does not have a TV in her room.  She tries to go to sleep around 8 PM.  She falls asleep at 10 PM.  She wakes up every 2 hours to drink water and void.  She has a history of dry mouth for which she keeps a large bottle of water by her bed.  She drinks caffeine throughout the day including sweet tea and cherry Coke, regular. She is due for a repeat sleep study- last was mild in 2019.   The patient reports ongoing daytime headaches.  These have been ongoing for many years.  These wax and wane with her periods of stress at work and with her job.  She previously had an MRI which showed possible partial empty sella syndrome. She takes naproxen 1-2 times per week for knee pain. She does not take other medication. Denies vision changes, nighttime headaches, vomiting, nausea, photophobia, phonophobia, urinary incontinence, difficulty with weight.   The patient reports compliance with her antihypertensive medications. She reports her headache does not come/go with her headaches.   ROS:  ROS  Negative except for as above  I have reviewed the patient's medical, surgical, family, and social history as appropriate.   Vitals:   04/01/18 0955  BP: 140/80  Pulse: 63  Temp: 97.7 F (36.5 C)  SpO2: 99%   Filed Weights   04/01/18 0955  Weight: 228 lb 9.6 oz (103.7 kg)   HEENT: Sclera anicteric. Dentition is moderate. Appears well hydrated. Neck:  Supple Cardiac: Regular rate and rhythm. Normal S1/S2. No murmurs, rubs, or gallops appreciated. Lungs: Clear bilaterally to ascultation.  Abdomen: Normoactive bowel sounds. No tenderness to deep or light palpation. No rebound or guarding.  Extremities: Warm, well perfused without edema.  Skin: Warm, dry Psych: Pleasant and appropriate   MRI 2016   IMPRESSION: 1.  No acute intracranial abnormality. 2. Stable and normal MRI appearance of the brain other than chronic partially empty sella appearance. While this often is a normal anatomic variant it can be associated with idiopathic intracranial hypertension (pseudotumor cerebri). CSF opening pressure would evaluate further.  Veronica [name] was seen today for follow-up.  Diagnoses and all orders for this visit:  Chronic daily headache, uncertain cause, possibly related OSA, less likely NPH. No signs of medication overuse headache. Not consistent with migraine.  -     Ambulatory referral to Neurology  Insomnia reviewed sleep hygiene at length. Repeat sleep study ordered.   Hypertension at goal, <140/90.   HCM  UTD on colonoscopy, Pap, and mammogram.   [name], MD  [facility] Medicine Teaching Service

## 2018-04-01 NOTE — Assessment & Plan Note (Signed)
Continue thiazide, losartan, aldactone.

## 2018-04-04 ENCOUNTER — Encounter: Payer: Self-pay | Admitting: Neurology

## 2018-06-09 NOTE — Progress Notes (Signed)
NEUROLOGY CONSULTATION NOTE  Veronica [name] MRN: [id number] DOB: 10-28-1960  Referring provider: [name], MD Primary care provider: [name], MD  Reason for consult:  headaches  HISTORY OF PRESENT ILLNESS: Veronica [name] is a 58 year old right-handed African-American woman with SLE, hypertension, and allergic rhinitis who presents for headaches.  History supplemented by referring provider note.  Onset:  Around 2012-2013.   Location:  Across forehead Quality:  pressure Intensity:  6-7/10.  She denies new headache, thunderclap headache or severe headache that wakes her from sleep. Aura:  none Prodrome:  none Postdrome:  none Associated symptoms:  Slight dizziness..  She denies associated unilateral numbness or weakness. Duration:  Until she takes something for it. Frequency:  Daily since 3 months ago, usually in the morning, resolves and returns in late afternoon.  They were once in awhile before then Frequency of abortive medication: Takes either naproxen or Tylenol daily Triggers:  Stress, caffeine Relieving factors:  Pain relievers Activity:  Does not aggravate  She has a known partially empty sella which was personally reviewed on MRI of brain with and without contrast from 04/08/15 and MRI of brain without contrast on 07/12/11.    She has her eyes examined annually.  No significant issues.    Current NSAIDS:  Naproxen 500mg   Current analgesics:  Tylenol Current triptans:  none Current ergotamine:  none Current anti-emetic:  none Current muscle relaxants:  Flexeril  Current anti-anxiolytic:  none Current sleep aide:  none Current Antihypertensive medications:  Losartan, indapamide, spironolactone Current Antidepressant medications:  none Current Anticonvulsant medications:  none Current anti-CGRP:  none Current Vitamins/Herbal/Supplements:  none Current Antihistamines/Decongestants:  Zyrtec Other therapy:  Vicks vapor rub to forehead Other medication:   Plaquenil  Past NSAIDS:  ibuprofen Past analgesics:  Excedrin Past abortive triptans:  none Past abortive ergotamine:  none Past muscle relaxants:  none Past anti-emetic:  none Past antihypertensive medications:  none Past antidepressant medications:  none Past anticonvulsant medications:  none Past anti-CGRP:  none Past vitamins/Herbal/Supplements:  none Past antihistamines/decongestants:  none Other past therapies:  none  Caffeine:  No coffee.  Sometimes Coke Diet:  Drinks 3 bottles of 16.9 oz water daily.  1 can of Fanta Orange or [employer] Exercise:  no Depression:  no; Anxiety:  Increased work-related anxiety Other pain:  no Sleep hygiene:  Poor.  Diagnosed with mild OSA last year. Instructed to lose weight and follow up.  She reports having lost weight but has not followed up. Sister has MS.  BMP from 12/20/17 was normal  PAST MEDICAL HISTORY: Past Medical History:  Diagnosis Date  . Daily headache   . Fibromyalgia   . Hypertension   . Prediabetes   . Rheumatoid arthritis (HCC)   . SLE (systemic lupus erythematosus) (HCC) 1998    PAST SURGICAL HISTORY: Past Surgical History:  Procedure Laterality Date  . ABDOMINAL HYSTERECTOMY  09/2008  . CESAREAN SECTION  1992, 1995  . CHOLECYSTECTOMY N/A 07/18/2012   Procedure: LAPAROSCOPIC CHOLECYSTECTOMY WITH INTRAOPERATIVE CHOLANGIOGRAM;  Surgeon: Atilano Ina, MD;  Location: [facility] OR;  Service: General;  Laterality: N/A;  . ERCP N/A 07/29/2012   Procedure: ENDOSCOPIC RETROGRADE CHOLANGIOPANCREATOGRAPHY (ERCP);  Surgeon: Theda Belfast, MD;  Location: [facility];  Service: Endoscopy;  Laterality: N/A;  Dr. Elnoria Howard said he would start this PT arond 1330( AW)  . INCISION AND DRAINAGE Right 11/28/2015   "boil on upper arm; did this in dr's office"  . INCISION AND DRAINAGE ABSCESS Right 12/02/2015  Procedure: INCISION AND DRAINAGE ABSCESS;  Surgeon: Jimmye Norman, MD;  Location[facility] OR;  Service: General;  Laterality: Right;  Right  axillary abscess  . TUBAL LIGATION  1995    MEDICATIONS: Current Outpatient Medications on File Prior to Visit  Medication Sig Dispense Refill  . atorvastatin (LIPITOR) 10 MG tablet Take 1 tablet (10 mg total) by mouth daily. 90 tablet 3  . cetirizine (ZYRTEC) 10 MG tablet TAKE 1 TABLET BY MOUTH AS NEEDED FOR  ALLERGIES 90 tablet 1  . Cyclobenzaprine HCl (FLEXERIL PO) Take by mouth 2 (two) times daily as needed.    Marland Kitchen glucose blood test strip Use as instructed (Patient not taking: Reported on 01/28/2018) 100 each 12  . hydroxychloroquine (PLAQUENIL) 200 MG tablet Take 1 tablet (200 mg total) by mouth daily. 90 tablet   . indapamide (LOZOL) 2.5 MG tablet Take 1 tablet (2.5 mg total) by mouth daily. 90 tablet 2  . losartan (COZAAR) 100 MG tablet Take 1 tablet (100 mg total) by mouth daily. 90 tablet 1  . naproxen (NAPROSYN) 500 MG tablet Take 500 mg by mouth 2 (two) times daily with a meal.    . spironolactone (ALDACTONE) 25 MG tablet Take 1 tablet (25 mg total) by mouth daily. 90 tablet 2   No current facility-administered medications on file prior to visit.     ALLERGIES: Allergies  Allergen Reactions  . Amlodipine Swelling    Leg edema  . Tessalon [Benzonatate] Other (See Comments)    Lip swelling    FAMILY HISTORY: Family History  Problem Relation Age of Onset  . Diabetes Mother   . Hypertension Father   . Multiple sclerosis Sister   . Hypertension Brother     SOCIAL HISTORY: Social History   Socioeconomic History  . Marital status: Married    Spouse name: Not on file  . Number of children: Not on file  . Years of education: Not on file  . Highest education level: Not on file  Occupational History  . Not on file  Social Needs  . Financial resource strain: Not on file  . Food insecurity:    Worry: Not on file    Inability: Not on file  . Transportation needs:    Medical: Not on file    Non-medical: Not on file  Tobacco Use  . Smoking status: Never Smoker  .  Smokeless tobacco: Never Used  Substance and Sexual Activity  . Alcohol use: No  . Drug use: No  . Sexual activity: Yes    Partners: Male  Lifestyle  . Physical activity:    Days per week: Not on file    Minutes per session: Not on file  . Stress: Not on file  Relationships  . Social connections:    Talks on phone: Not on file    Gets together: Not on file    Attends religious service: Not on file    Active member of club or organization: Not on file    Attends meetings of clubs or organizations: Not on file    Relationship status: Not on file  . Intimate partner violence:    Fear of current or ex partner: Not on file    Emotionally abused: Not on file    Physically abused: Not on file    Forced sexual activity: Not on file  Other Topics Concern  . Not on file  Social History Narrative   Pt lives with spouse. Some college.    Works 7  days on then 7 days off at place of work     REVIEW OF SYSTEMS: Constitutional: No fevers, chills, or sweats, no generalized fatigue, change in appetite Eyes: No visual changes, double vision, eye pain Ear, nose and throat: No hearing loss, ear pain, nasal congestion, sore throat Cardiovascular: No chest pain, palpitations Respiratory:  No shortness of breath at rest or with exertion, wheezes GastrointestinaI: No nausea, vomiting, diarrhea, abdominal pain, fecal incontinence Genitourinary:  No dysuria, urinary retention or frequency Musculoskeletal:  No neck pain, back pain Integumentary: No rash, pruritus, skin lesions Neurological: as above Psychiatric: No depression.  Some stress. Endocrine: No palpitations, fatigue, diaphoresis, mood swings, change in appetite, change in weight, increased thirst Hematologic/Lymphatic:  No purpura, petechiae. Allergic/Immunologic: no itchy/runny eyes, nasal congestion, recent allergic reactions, rashes  PHYSICAL EXAM: Blood pressure 130/64, pulse 70, height 5' 0.5" (1.537 m), weight 228 lb (103.4 kg),  SpO2 98 %. General: No acute distress.  Patient appears well-groomed.   Head:  Normocephalic/atraumatic Eyes:  fundi examined but not visualized Neck: supple, no paraspinal tenderness, full range of motion Back: No paraspinal tenderness Heart: regular rate and rhythm Lungs: Clear to auscultation bilaterally. Vascular: No carotid bruits. Neurological Exam: Mental status: alert and oriented to person, place, and time, recent and remote memory intact, fund of knowledge intact, attention and concentration intact, speech fluent and not dysarthric, language intact. Cranial nerves: CN I: not tested CN II: pupils equal, round and reactive to light, visual fields intact CN III, IV, VI:  full range of motion, no nystagmus, no ptosis CN V: facial sensation intact CN VII: upper and lower face symmetric CN VIII: hearing intact CN IX, X: gag intact, uvula midline CN XI: sternocleidomastoid and trapezius muscles intact CN XII: tongue midline Bulk & Tone: normal, no fasciculations. Motor:  5/5 throughout  Sensation:  temperature and vibration sensation intact.  Deep Tendon Reflexes:  2+ throughout, toes downgoing.   Finger to nose testing:  Without dysmetria.  Heel to shin:  Without dysmetria.   Gait:  Normal station and stride.  Able to turn and tandem walk. Romberg negative.  IMPRESSION: Chronic tension type headache, not intractable   PLAN: 1.  For preventative management, start nortriptyline 10mg  at bedtime.  If headaches still frequent in 6 weeks, she is to contact me and we can increase dose to 25mg  at bedtime. 2. Limit use of Tylenol and naproxen (all pain relievers) to no more than 2 days out of week to prevent risk of rebound or medication-overuse headache. 3.  Keep headache diary 4.  Exercise, hydration, caffeine cessation, sleep hygiene, monitor for and avoid triggers.  Sleep hygiene sheet provided.  Advised to follow up with sleep specialist. 5.  Consider:  magnesium citrate 400mg   daily, riboflavin 400mg  daily, and coenzyme Q10 100mg  three times daily 6.  Follow up in 4 months.  Thank you for allowing me to take part in the care of this patient.  Shon Millet, DO  CC: [name], MD

## 2018-06-10 ENCOUNTER — Encounter: Payer: Self-pay | Admitting: Neurology

## 2018-06-10 ENCOUNTER — Ambulatory Visit: Payer: BLUE CROSS/BLUE SHIELD | Admitting: Neurology

## 2018-06-10 VITALS — BP 130/64 | HR 70 | Ht 60.5 in | Wt 228.0 lb

## 2018-06-10 DIAGNOSIS — G44229 Chronic tension-type headache, not intractable: Secondary | ICD-10-CM

## 2018-06-10 MED ORDER — NORTRIPTYLINE HCL 10 MG PO CAPS: 10.0000 mg | ORAL_CAPSULE | Freq: Every day | ORAL | 3 refills | Status: DC

## 2018-06-10 NOTE — Patient Instructions (Signed)
1.  For preventative management, start nortriptyline 10mg  at bedtime.  If headaches not improved in 6 weeks, contact me and we can increase dose. 2.  Limit use of naproxen and Tylenol to no more than 2 days out of week to prevent risk of rebound or medication-overuse headache. 3.  Keep headache diary 4.  Exercise, hydration, caffeine cessation, stress reduction, sleep hygiene, monitor for and avoid triggers.  Follow up regarding sleep apnea 5.  Consider:  magnesium citrate 400mg  daily, riboflavin 400mg  daily, and coenzyme Q10 100mg  three times daily 6.  Follow up in 4 months.

## 2018-07-04 ENCOUNTER — Other Ambulatory Visit: Payer: Self-pay

## 2018-07-04 DIAGNOSIS — I10 Essential (primary) hypertension: Secondary | ICD-10-CM

## 2018-07-04 MED ORDER — SPIRONOLACTONE 25 MG PO TABS: 25.0000 mg | ORAL_TABLET | Freq: Every day | ORAL | 2 refills | Status: DC

## 2018-07-04 NOTE — Telephone Encounter (Signed)
Patient called nurse line requesting a refill on Spironolactone. Pt requests a 90 day supply. Please advise.

## 2018-09-29 ENCOUNTER — Other Ambulatory Visit: Payer: Self-pay

## 2018-09-29 DIAGNOSIS — I10 Essential (primary) hypertension: Secondary | ICD-10-CM

## 2018-09-29 MED ORDER — ATORVASTATIN CALCIUM 10 MG PO TABS: 10.0000 mg | ORAL_TABLET | Freq: Every day | ORAL | 3 refills | Status: DC

## 2018-09-29 MED ORDER — INDAPAMIDE 2.5 MG PO TABS: 2.5000 mg | ORAL_TABLET | Freq: Every day | ORAL | 2 refills | Status: DC

## 2018-10-10 ENCOUNTER — Encounter: Payer: Self-pay | Admitting: Neurology

## 2018-10-13 ENCOUNTER — Ambulatory Visit: Payer: BLUE CROSS/BLUE SHIELD | Admitting: Neurology

## 2018-12-30 ENCOUNTER — Telehealth: Payer: Self-pay

## 2018-12-30 MED ORDER — LOSARTAN POTASSIUM 100 MG PO TABS: 100.0000 mg | ORAL_TABLET | Freq: Every day | ORAL | 1 refills | Status: DC

## 2018-12-30 NOTE — Telephone Encounter (Signed)
Called patient and she no longer comes here because she has [facility] and we do not take her insurance.  Veronica [name], [place]

## 2018-12-30 NOTE — Telephone Encounter (Signed)
Please schedule for office visit with me to check blood work.  Dorris Singh, MD  Family Medicine Teaching Service

## 2019-03-30 ENCOUNTER — Other Ambulatory Visit: Payer: Self-pay

## 2019-03-30 DIAGNOSIS — Z20822 Contact with and (suspected) exposure to covid-19: Secondary | ICD-10-CM

## 2019-03-31 LAB — NOVEL CORONAVIRUS, NAA: SARS-CoV-2, NAA: NOT DETECTED

## 2019-04-02 ENCOUNTER — Other Ambulatory Visit: Payer: Self-pay | Admitting: Neurology

## 2019-04-02 NOTE — Telephone Encounter (Signed)
Requested Prescriptions   Pending Prescriptions Disp Refills  . nortriptyline (PAMELOR) 10 MG capsule [Pharmacy Med Name: Nortriptyline HCl 10 MG Oral Capsule] 30 capsule 0    Sig: Take 1 capsule by mouth at bedtime   Rx last filled: 06/10/18 #30 3 refills  Pt last seen:06/10/18  Follow up appt scheduled:none

## 2019-04-22 ENCOUNTER — Ambulatory Visit: Payer: BLUE CROSS/BLUE SHIELD | Attending: Internal Medicine

## 2019-04-22 DIAGNOSIS — Z20822 Contact with and (suspected) exposure to covid-19: Secondary | ICD-10-CM

## 2019-04-23 LAB — NOVEL CORONAVIRUS, NAA: SARS-CoV-2, NAA: DETECTED — AB

## 2019-05-15 ENCOUNTER — Encounter: Payer: Self-pay | Admitting: Family Medicine

## 2019-05-15 ENCOUNTER — Ambulatory Visit (INDEPENDENT_AMBULATORY_CARE_PROVIDER_SITE_OTHER): Payer: 59 | Admitting: Family Medicine

## 2019-05-15 ENCOUNTER — Other Ambulatory Visit: Payer: Self-pay

## 2019-05-15 VITALS — BP 110/64 | HR 96 | Wt 232.6 lb

## 2019-05-15 DIAGNOSIS — R058 Other specified cough: Secondary | ICD-10-CM

## 2019-05-15 DIAGNOSIS — R05 Cough: Secondary | ICD-10-CM

## 2019-05-15 DIAGNOSIS — F321 Major depressive disorder, single episode, moderate: Secondary | ICD-10-CM | POA: Diagnosis not present

## 2019-05-15 DIAGNOSIS — M329 Systemic lupus erythematosus, unspecified: Secondary | ICD-10-CM | POA: Diagnosis not present

## 2019-05-15 DIAGNOSIS — I10 Essential (primary) hypertension: Secondary | ICD-10-CM

## 2019-05-15 DIAGNOSIS — F411 Generalized anxiety disorder: Secondary | ICD-10-CM | POA: Insufficient documentation

## 2019-05-15 DIAGNOSIS — A599 Trichomoniasis, unspecified: Secondary | ICD-10-CM

## 2019-05-15 MED ORDER — FLUOXETINE HCL 10 MG PO TABS: 10.0000 mg | ORAL_TABLET | Freq: Every day | ORAL | 3 refills | Status: DC

## 2019-05-15 MED ORDER — LOSARTAN POTASSIUM 100 MG PO TABS: 100.0000 mg | ORAL_TABLET | Freq: Every day | ORAL | 1 refills | Status: DC

## 2019-05-15 MED ORDER — INDAPAMIDE 2.5 MG PO TABS: 2.5000 mg | ORAL_TABLET | Freq: Every day | ORAL | 2 refills | Status: DC

## 2019-05-15 MED ORDER — CETIRIZINE HCL 10 MG PO TABS: ORAL_TABLET | ORAL | 1 refills | Status: DC

## 2019-05-15 NOTE — Patient Instructions (Addendum)
1. Stretch your ankles three times a day 2. Start Prozac 10 mg daily---- call me if you have side effects, take in the morning 3. I will reach out to Dr. Hartford Poli and get back to you 4. Follow up in 2 weeks with a virtual check in 5. GREAT JOB with your  Blood pressure.   It was wonderful to see you today.  Thank you for choosing [facility].   Please call [phone] with any questions about today's appointment.  Please be sure to schedule follow up at the front  desk before you leave today.   Dorris Singh, MD  Family Medicine      Therapy and Counseling Resources Most providers on this list will take Medicaid. Patients with commercial insurance or Medicare should contact their insurance company to get a list of in network providers.  [facility] [address] [place], [place] [address]       [phone]     [facility] [address] [place], [place]    Jinny Blossom [facility] [address] [place], [place]  [facility]:  [place]. [place]  [facility]:  [address]oni [place], [place]  [facility] (under & uninsured) [address], [place] [phone]    [email or web address]    [facility] [place]     Phone:  [phone]     [place]  [place] [address]      [place], [place] ext [place]: [place], [place], [place]   [place] ([place] therapist) [address] [place] [address]   [place] [address]    [phone]    [facility]   [place],  [place], [place]   [place]   [place]  [facility]  [address] [place], [place]        [phone]  Open Access/Walk In Clinic under & uninsured [place],  [address] [place] [phone]):  Mon - Fri from 8 AM - 3 PM  Family Service of the [place],  ([place])   [place], [place]: [phone]) 8:30 - 12; 1 - 2:30  Family Service of the [place],  [place], [place]    [phone]):8:30 - 12; 2 - 3PM  [facility] [employer],  [address],  [place]; [phone]):   Mon - Fri 8 AM - 5 PM  [facility] [place]  MWF 12:30 to 3:00 or call to schedule an appointment  [phone]  Specific Provider options Psychology Today  [email or web address].psychologytoday.com/us 1. click on find a therapist  2. enter your zip code 3. left side and select or tailor a therapist for your specific need.   [facility] Provider Directory [email or web address]  (Medicaid)   Follow all drop down to find a provider  [place] or [email or web address] [address], [place], [place] Recovery support and educational   In home counseling [place] Telephone: [phone]  office in [facility] [email or web address]   Does not take reg. Medicaid  or [employer] BCCS, [place] health Choice, [facility], [place], [place], [place], [place] Health Choice  24- Hour Availability:  . [facility]   [phone] or [phone]  . Family Service of the [facility] [phone]  [facility] Crisis Service  [phone]   . [facility] [employer]  [phone] (after hours)  . Therapeutic Alternative/Mobile Crisis   [phone]  . [place] National Suicide Hotline  (959)084-5999 (TALK)  . Call 911 or go to emergency room  . [employer]  [phone]);  [place] and [employer]    . [employer] ACCESS  [phone]); [place], [place], [place], [place], [place], [place], [place]

## 2019-05-15 NOTE — Assessment & Plan Note (Signed)
At goal, refilled only.

## 2019-05-15 NOTE — Assessment & Plan Note (Signed)
Needs repeat at follow up.

## 2019-05-15 NOTE — Progress Notes (Signed)
Patient Name: Veronica [name] Date of Birth: 05/09/1960 Date of Visit: 05/15/19 PCP: Westley Chandler, MD  Chief Complaint: follow up, re-establish care   Subjective: Veronica [name] is a pleasant 59 y.o. with medical history significant for hypertension and SLE presenting today for check in.  HTN Taking medications as prescribed. No side effects. No chest pain/dyspnea. BMP recently at [facility] was normal.   SLE/RA Recently seen by [facility]. Reports some bilateral ankle pain, aches after starting methotrexate. She was taken off hydroxychloroquine due to G6PD deficiency. No joint swelling or weight loss.   Headaches Takes nortryptiline PRN. Reports intermittent left sided headaches lasting < 5 minutes. Has had similar headaches to this in the past. No dizziness, vision changes, speech changes. No falls. Has been experiencing increased stress. No dental pain or sinus issues.   Depression The patient scored a 4 on PHQ2. Reports low mood, feeling anxious, poor sleeping, increased appetite. Major stressors is that her husband lost his job. He has been at home for nearly a year. They now just have 1 car. 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 Denies thoughts of hurting herself or others. No history of bipolar or admissions. Feels safe at home.   In terms of job, the patient has a lot of stress at work---has been at group home for 13 year. She now has to stay overnight for 7 days in a row. No increase in pay, which is stressor.   In terms of sleep, the patient has her cell phone in the room. Has TV in room at home and at work. Has difficulty falling and staying asleep.    ROS: Per HPI.   I have reviewed the patient's medical, surgical, family, and social history as appropriate.  Vitals:   05/15/19 1011  BP: 110/64  Pulse: 96  SpO2: 99%   HEENT: Sclera anicteric. Dentition is moderate. Appears well hydrated. TMs with sclerosis. NO  effusion or redness, minimal cerumen. Spasm along bilateral trapezius.  Cardiac: Regular rate and rhythm. Normal S1/S2. No murmurs, rubs, or gallops appreciated. Lungs: Clear bilaterally to ascultation.  Extremities: Warm, well perfused without edema.  Ankles- no effusions. No erythema.  TTP over left Achilles, minimal pain with dorsiflexion.  Psych: Pleasant and appropriate    Depression, major, single episode, moderate (HCC) New, acute onset. Discussed options at length. Interested in therapy. Will discuss with Dr. Shawnee Knapp. Starting low dose SSRI---- will check in 2 weeks for side effects. Discussed sleep hygiene. Can try melatonin at night. Resources provided.   Essential hypertension, benign At goal, refilled only.   Trichimoniasis Needs repeat at follow up.   Total time: 35 minutes, including time spent reviewing labs, notes, and imaging (CT scan) from outside records.   Return to care in 2 weeks- virtual check in.   [name], MD  Family Medicine Teaching Service

## 2019-05-15 NOTE — Assessment & Plan Note (Signed)
New, acute onset. Discussed options at length. Interested in therapy. Will discuss with Dr. Shawnee Knapp. Starting low dose SSRI---- will check in 2 weeks for side effects. Discussed sleep hygiene. Can try melatonin at night. Resources provided.

## 2019-05-29 ENCOUNTER — Other Ambulatory Visit: Payer: Self-pay

## 2019-05-29 ENCOUNTER — Telehealth (INDEPENDENT_AMBULATORY_CARE_PROVIDER_SITE_OTHER): Payer: 59 | Admitting: Family Medicine

## 2019-05-29 ENCOUNTER — Telehealth: Payer: Self-pay | Admitting: Psychology

## 2019-05-29 ENCOUNTER — Encounter: Payer: Self-pay | Admitting: Family Medicine

## 2019-05-29 DIAGNOSIS — F411 Generalized anxiety disorder: Secondary | ICD-10-CM | POA: Diagnosis not present

## 2019-05-29 NOTE — Telephone Encounter (Signed)
Schedule BH appt with Dr. Shawnee Knapp for 2/2/ at 10am virtually.  Pt was made aware that Dr. Manson Passey and Dr. Shawnee Knapp document in same place and communicate about care.

## 2019-05-29 NOTE — Assessment & Plan Note (Signed)
Symptoms most predominant with anxiety--reports depressive symptoms in past. Has previously had bouts of anxiety. Continue Prozac at current dose. Will reach out to Dr. Shawnee Knapp to see if appropriate for short session of therapy.   Follow up early March for medication check

## 2019-05-29 NOTE — Telephone Encounter (Signed)
-----   Message from Westley Chandler, MD sent at 05/29/2019 12:37 PM EST ----- Hi,This patient is struggling with some anxiety--let me know if appropriate for therapy with you.Thanks!Carina

## 2019-05-29 NOTE — Progress Notes (Signed)
[facility] Telemedicine Visit  Patient consented to have virtual visit. Method of visit: Telephone  Encounter participants: Patient: Veronica [name] - located at home- alone Provider: Westley Chandler - located at [facility] Others (if applicable): NA  Chief Complaint: check in on mood, follow up fluoxetine   HPI: Veronica [name] is a pleasant 59 year old with history of RA, G5PD deficiency, and HTN presenting for follow up of anxiety.  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 She has had anxiety on and off for years--- worsened the past 12 months. Intermittently has spikes in anxiety---yesterday triggered for no reason. No chest pain. Denies low interest, SI, HI. Reports small improvement in symptoms with fluoxetine--- has no side effects. Reports appetite lower since not working this week (starts work on Wednesday next week).   ROS: per HPI  Pertinent PMHx:  RA SLE HTN Fibromyalagia   Exam:  Respiratory: Speaking in full sentences   Assessment/Plan:  Anxiety state Symptoms most predominant with anxiety--reports depressive symptoms in past. Has previously had bouts of anxiety. Continue Prozac at current dose. Will reach out to Dr. Shawnee Knapp to see if appropriate for short session of therapy.   Follow up early March for medication check     Time spent during visit with patient: 8 minutes

## 2019-06-02 ENCOUNTER — Other Ambulatory Visit: Payer: Self-pay

## 2019-06-02 ENCOUNTER — Ambulatory Visit (INDEPENDENT_AMBULATORY_CARE_PROVIDER_SITE_OTHER): Payer: 59 | Admitting: Psychology

## 2019-06-02 DIAGNOSIS — F411 Generalized anxiety disorder: Secondary | ICD-10-CM

## 2019-06-02 NOTE — BH Specialist Note (Signed)
Integrated Behavioral Health Visit via Telemedicine (Telephone)  06/02/2019 Veronica [name] [id number]   Session Start time: 1030 Session End time: 11 Total time: 30  Referring Provider: Dr. Manson Passey Type of Visit: Telephonic Patient location: home [facility] Provider location: [facility] All persons participating in visit: pt and provider   Confirmed patient's address: Yes  Confirmed patient's phone number: Yes  Any changes to demographics: No   Confirmed patient's insurance: Yes  Any changes to patient's insurance: No   Discussed confidentiality: Yes    The following statements were read to the patient and/or legal guardian that are established with the [facility] Provider.  "The purpose of this phone visit is to provide behavioral health care while limiting exposure to the coronavirus (COVID19).  There is a possibility of technology failure and discussed alternative modes of communication if that failure occurs."  "By engaging in this telephone visit, you consent to the provision of healthcare.  Additionally, you authorize for your insurance to be billed for the services provided during this telephone visit."   Patient and/or legal guardian consented to telephone visit: Yes   PRESENTING CONCERNS: Patient and/or family reports the following symptoms/concerns: Pt reported she has been struggling with stress related to her marriage.  Pt reported that she is frustrated with her husband because of his substance use and lack of follow through with jobs.  Pt reported that she has been struggling sleeping and wakes up in an anxious state.  Pt reported that she may want to consider couples therapy but wanted to check with husband before deciding.  Pt and Dr. Shawnee Knapp talked through her coping strategies for stress such as creating time for walks.  Duration of problem: past year; Severity of problem: mild  STRENGTHS (Protective Factors/Coping Skills): Insight and strong coping strategies   GOALS  ADDRESSED: Patient will: 1.  Reduce symptoms of: anxiety: anxiety related to marriage; issues with sleep  2.  Increase knowledge and/or ability of: stress reduction  3.  Demonstrate ability to: Increase healthy adjustment to current life circumstances  INTERVENTIONS: Interventions utilized:  Brief CBT Standardized Assessments completed: Not Needed  ASSESSMENT: Patient currently experiencing anxiety related to life transitions .   Patient may benefit from brief CBT and couples therapy.  PLAN: 1. Follow up with behavioral health clinician on : if would like to engage in individual therapy or couples therapy  2. Behavioral recommendations: continue using coping strategies  3. Referral(s): Integrated [employer] (In Clinic)  Royetta Asal, PhD., LMFT-A

## 2019-06-10 ENCOUNTER — Telehealth: Payer: Self-pay | Admitting: *Deleted

## 2019-06-10 NOTE — Telephone Encounter (Signed)
-----   Message from Westley Chandler, MD sent at 06/08/2019  4:25 PM EST ----- Regarding: Follow up Hi,  Please call patient and help her to schedule follow up in March with me.   Thanks!CB

## 2019-06-10 NOTE — Telephone Encounter (Signed)
Pt informed and scheduled in march. Deseree Bruna Potter, CMA

## 2019-06-15 ENCOUNTER — Other Ambulatory Visit: Payer: Self-pay

## 2019-06-15 ENCOUNTER — Ambulatory Visit: Payer: 59 | Admitting: Psychology

## 2019-06-18 ENCOUNTER — Ambulatory Visit (INDEPENDENT_AMBULATORY_CARE_PROVIDER_SITE_OTHER): Payer: 59 | Admitting: Psychology

## 2019-06-18 DIAGNOSIS — F411 Generalized anxiety disorder: Secondary | ICD-10-CM
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
## 2019-06-25 ENCOUNTER — Other Ambulatory Visit: Payer: Self-pay

## 2019-06-25 ENCOUNTER — Ambulatory Visit: Payer: 59 | Admitting: Psychology

## 2019-06-29 ENCOUNTER — Ambulatory Visit (INDEPENDENT_AMBULATORY_CARE_PROVIDER_SITE_OTHER): Payer: 59 | Admitting: Psychology

## 2019-06-29 ENCOUNTER — Other Ambulatory Visit: Payer: Self-pay

## 2019-06-29 DIAGNOSIS — F411 Generalized anxiety disorder: Secondary | ICD-10-CM
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
## 2019-07-09 ENCOUNTER — Other Ambulatory Visit (HOSPITAL_COMMUNITY): Payer: Self-pay | Admitting: Gastroenterology

## 2019-07-09 ENCOUNTER — Other Ambulatory Visit: Payer: Self-pay | Admitting: Gastroenterology

## 2019-07-09 DIAGNOSIS — Z8719 Personal history of other diseases of the digestive system: Secondary | ICD-10-CM

## 2019-07-09 DIAGNOSIS — R1032 Left lower quadrant pain: Secondary | ICD-10-CM

## 2019-07-13 ENCOUNTER — Ambulatory Visit (INDEPENDENT_AMBULATORY_CARE_PROVIDER_SITE_OTHER): Payer: 59 | Admitting: Family Medicine

## 2019-07-13 ENCOUNTER — Encounter: Payer: Self-pay | Admitting: Family Medicine

## 2019-07-13 ENCOUNTER — Other Ambulatory Visit: Payer: Self-pay

## 2019-07-13 DIAGNOSIS — Z1231 Encounter for screening mammogram for malignant neoplasm of breast: Secondary | ICD-10-CM | POA: Diagnosis not present

## 2019-07-13 DIAGNOSIS — M329 Systemic lupus erythematosus, unspecified: Secondary | ICD-10-CM | POA: Diagnosis not present

## 2019-07-13 DIAGNOSIS — G8929 Other chronic pain: Secondary | ICD-10-CM

## 2019-07-13 DIAGNOSIS — N941 Unspecified dyspareunia: Secondary | ICD-10-CM

## 2019-07-13 DIAGNOSIS — R7303 Prediabetes: Secondary | ICD-10-CM | POA: Insufficient documentation

## 2019-07-13 DIAGNOSIS — R109 Unspecified abdominal pain: Secondary | ICD-10-CM

## 2019-07-13 MED ORDER — METHOTREXATE 2.5 MG PO TABS: 12.5000 mg | ORAL_TABLET | ORAL | 0 refills | Status: DC

## 2019-07-13 NOTE — Assessment & Plan Note (Addendum)
Differential is broad. Has tried OTC lubricants---primarily right sided, referral to Gynecology, discussed with patient. History of hysterectomy ~ 10 years ago.

## 2019-07-13 NOTE — Patient Instructions (Addendum)
[facility]  [phone]  It was wonderful to see you today.  Thank you for choosing [facility].   Please call [phone] with any questions about today's appointment.  Please be sure to schedule follow up at the front  desk before you leave today.   [name], MD  Family Medicine    Follow up in 3 months

## 2019-07-13 NOTE — Assessment & Plan Note (Signed)
A1C 6.1 in fall 2020---repeat in 6-12 months.

## 2019-07-13 NOTE — Progress Notes (Signed)
Subjective  Veronica [name] is a 59 y.o. individual  is presenting with the following: depression and stomach issues.  Stomach issues Recently seen by Dr. Benson Norway and treated for bout of diverticulitis. She completed antibiotic therapy. She has burning sensation in epigastric and periumbilical area after each meal.She does have unintentional weight loss related to poor appetite. Reports burning sensation after meals resulting in lower appetite.  No blood in stool. Does endorse some buttock irritation from wiping. Has a CT scan later this week. Is up to date on colonoscopy.   Depression Patient reports mood improving slowly. Still has strain in relationship with husband. She asked him to attend counseling, he declined. She does not find joy in relationship. She is looking for separate housing currently. Continues to be very busy at work. Major ongoing issue is sleep. Continues to sleep at most 4 hours per night, which is improvement. Sleep hygiene improving.  Denies physical abuse. Denies SI/HI. Following with Dr. Hartford Poli. Reports Fluoxetine is helping. She denies specific side effects, although query if stomach issues related to this (began around same time interval).   Prediabetes A1C 6.1 in December, repeat in summer (or next winter per ADA guidelines). She has lost weight secondary to bowel symptoms (above). She continues to try to stay active during weeks off work.   Dyspareunia Has had dysparuenia with deep penetration for about 2 years. Has tried OTC cleanser (Replens). Has a history of hysterectomy in 2010. No vaginal bleeding or irritation. Also has a history of Trichomonas.   Bilateral leg pain Reports several months of bilateral leg pain, localized to ankles and anterior thighs. Begins as soon as she starts walking. No falls, joint erythema, swelling. Thinks this may be related to RA. Unchanged by position. Unchanged by duration of walking or distance. Does not improve with walking.     Objective Vital Signs reviewed BP 122/80   Pulse 75   Ht 5\' 4"  (1.626 m)   Wt 225 lb 12.8 oz (102.4 kg)   SpO2 99%   BMI 38.76 kg/m  Cardiac: Regular rate and rhythm. Normal S1/S2. No murmurs, rubs, or gallops appreciated. Lungs: Clear bilaterally to ascultation.  Abdomen: Normoactive bowel sounds. No tenderness to deep or light palpation. No rebound or guarding.   Assessments/Plans  Bilateral leg pain Uncertain etiology, chronic problem, recently worsened. Localized to ankles, recommended more supportive shoes, differential includes RA flare, tendinopathy related to ABX. Consider ABI in future (although history not consistent with PAD). Spinal stenosis also considered but less likely.   Prediabetes A1C 6.1 in fall 2020---repeat in 6-12 months.   Dyspareunia in female Differential is broad, possibly due to atrophy, although unilateral nature makes this less likely. History of hysterectomy ~ 10 years ago. Ovarian pathology is possible--- TAH without BSO previously. We discussed how relationship strain may be contributing. Has tried OTC lubricants---primarily right sided, referral to Gynecology, discussed with patient.  Depression Sleep hygiene discussed Consider buspirone in future for intermittent anxiety symptoms Follow up with Dr. Hartford Poli Continue Fluoxetine---consider transition as below if GI symptoms do not improve    Weight loss Likely secondary to GI issues. Methotrexate may also be contributing. UTD on Pap smear (benign cytology, hysterectomy), Mammogram ordered as below. If persists after GI evaluation, will obtain labs, consider sedimentation rate. Consider transition to Citalopram in future if fluoxetine thought to be causing GI symptoms.   HCM Mammogram ordered for screening today   See after visit summary for details of patient instructions  Dorris Singh, MD  Family Medicine Teaching Service

## 2019-07-15 ENCOUNTER — Ambulatory Visit (HOSPITAL_COMMUNITY)
Admission: RE | Admit: 2019-07-15 | Discharge: 2019-07-15 | Disposition: A | Discharge status: Home / Self Care | Payer: 59 | Source: Ambulatory Visit | Attending: Gastroenterology | Admitting: Gastroenterology

## 2019-07-15 ENCOUNTER — Encounter: Payer: Self-pay | Admitting: Family Medicine

## 2019-07-15 ENCOUNTER — Other Ambulatory Visit: Payer: Self-pay

## 2019-07-15 DIAGNOSIS — Z8719 Personal history of other diseases of the digestive system: Secondary | ICD-10-CM | POA: Diagnosis present

## 2019-07-15 DIAGNOSIS — I700 Atherosclerosis of aorta: Secondary | ICD-10-CM | POA: Insufficient documentation

## 2019-07-15 DIAGNOSIS — R1032 Left lower quadrant pain: Secondary | ICD-10-CM | POA: Insufficient documentation

## 2019-07-15 MED ORDER — SODIUM CHLORIDE (PF) 0.9 % IJ SOLN
INTRAMUSCULAR | Status: AC
  Filled 2019-07-15: qty 50

## 2019-07-15 MED ORDER — IOHEXOL 9 MG/ML PO SOLN
ORAL | Status: AC
  Administered 2019-07-15: 500 mL via ORAL
  Filled 2019-07-15: qty 500

## 2019-07-15 MED ORDER — IOHEXOL 300 MG/ML  SOLN
100.0000 mL | Freq: Once | INTRAMUSCULAR | Status: AC | PRN
  Administered 2019-07-15: 100 mL via INTRAVENOUS

## 2019-07-15 MED ORDER — IOHEXOL 9 MG/ML PO SOLN: 500.0000 mL | ORAL | Status: AC

## 2019-07-21 ENCOUNTER — Other Ambulatory Visit: Payer: Self-pay

## 2019-07-21 ENCOUNTER — Ambulatory Visit (INDEPENDENT_AMBULATORY_CARE_PROVIDER_SITE_OTHER): Payer: 59 | Admitting: Psychology

## 2019-07-21 DIAGNOSIS — F411 Generalized anxiety disorder: Secondary | ICD-10-CM
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
## 2019-08-12 ENCOUNTER — Ambulatory Visit: Payer: 59 | Admitting: Psychology

## 2019-08-20 ENCOUNTER — Ambulatory Visit: Payer: 59

## 2019-08-24 ENCOUNTER — Ambulatory Visit: Payer: 59

## 2019-08-26 ENCOUNTER — Other Ambulatory Visit: Payer: Self-pay

## 2019-08-26 ENCOUNTER — Ambulatory Visit
Admission: RE | Admit: 2019-08-26 | Discharge: 2019-08-26 | Disposition: A | Discharge status: Home / Self Care | Payer: 59 | Source: Ambulatory Visit | Attending: Family Medicine | Admitting: Family Medicine

## 2019-08-26 ENCOUNTER — Encounter: Payer: Self-pay | Admitting: Family Medicine

## 2019-08-26 DIAGNOSIS — Z1231 Encounter for screening mammogram for malignant neoplasm of breast: Secondary | ICD-10-CM

## 2019-08-31 ENCOUNTER — Encounter: Payer: 59 | Admitting: Obstetrics & Gynecology

## 2019-08-31 ENCOUNTER — Ambulatory Visit (INDEPENDENT_AMBULATORY_CARE_PROVIDER_SITE_OTHER): Payer: 59 | Admitting: Psychology

## 2019-08-31 ENCOUNTER — Other Ambulatory Visit: Payer: Self-pay

## 2019-08-31 NOTE — BH Specialist Note (Signed)
Attempted to call pt twice, unable to reach pt and left 2 VM

## 2019-09-07 ENCOUNTER — Ambulatory Visit (INDEPENDENT_AMBULATORY_CARE_PROVIDER_SITE_OTHER): Payer: 59 | Admitting: Obstetrics and Gynecology

## 2019-09-07 ENCOUNTER — Encounter: Payer: Self-pay | Admitting: Obstetrics and Gynecology

## 2019-09-07 ENCOUNTER — Other Ambulatory Visit (HOSPITAL_COMMUNITY)
Admission: RE | Admit: 2019-09-07 | Discharge: 2019-09-07 | Disposition: A | Discharge status: Home / Self Care | Payer: 59 | Source: Ambulatory Visit | Attending: Obstetrics & Gynecology | Admitting: Obstetrics & Gynecology

## 2019-09-07 ENCOUNTER — Other Ambulatory Visit: Payer: Self-pay

## 2019-09-07 VITALS — BP 135/66 | HR 60 | Ht 64.0 in | Wt 225.0 lb

## 2019-09-07 DIAGNOSIS — Z124 Encounter for screening for malignant neoplasm of cervix: Secondary | ICD-10-CM | POA: Diagnosis not present

## 2019-09-07 DIAGNOSIS — R3915 Urgency of urination: Secondary | ICD-10-CM

## 2019-09-07 DIAGNOSIS — N941 Unspecified dyspareunia: Secondary | ICD-10-CM | POA: Insufficient documentation

## 2019-09-07 DIAGNOSIS — R102 Pelvic and perineal pain: Secondary | ICD-10-CM | POA: Diagnosis present

## 2019-09-07 LAB — POCT URINALYSIS DIP (DEVICE)
Bilirubin Urine: NEGATIVE
Glucose, UA: NEGATIVE mg/dL
Hgb urine dipstick: NEGATIVE
Ketones, ur: NEGATIVE mg/dL
Leukocytes,Ua: NEGATIVE
Nitrite: NEGATIVE
Protein, ur: NEGATIVE mg/dL
Specific Gravity, Urine: >=1.03 (ref 1.005–1.030)
Urobilinogen, UA: 0.2 mg/dL (ref 0.0–1.0)
pH: 5.5 (ref 5.0–8.0)

## 2019-09-07 NOTE — Patient Instructions (Signed)
Certain foods can irritate your bladder. This can give you the sensation of not emptying well or having sudden, significant urges to go to the bathroom. You can eliminate these foods and see if this improves your symptoms. Try to avoid alcohol, caffeine, citrus and spicy foods.   

## 2019-09-07 NOTE — Progress Notes (Signed)
GYNECOLOGY OFFICE FOLLOW UP NOTE  History:  59 y.o. [id number] here today for follow up for dyspareunia for several years. Has tried lube with no improvement. Also needs pap smear, s/p TAH, note unclear if cervix remains. Has been getting pap since, all normal.    Past Medical History:  Diagnosis Date  . Daily headache   . Fibromyalgia   . G6PD deficiency    outside labs--low level, no longer on HCQ  . Hypertension   . Prediabetes   . Rheumatoid arthritis ([place])   . SLE (systemic lupus erythematosus) ([place]) 1998    Past Surgical History:  Procedure Laterality Date  . ABDOMINAL HYSTERECTOMY  09/2008  . [place]  . CHOLECYSTECTOMY N/A 07/18/2012   Procedure: LAPAROSCOPIC CHOLECYSTECTOMY WITH INTRAOPERATIVE CHOLANGIOGRAM;  Surgeon: Gayland Curry, MD;  Location: [facility];  Service: General;  Laterality: N/A;  . ERCP N/A 07/29/2012   Procedure: ENDOSCOPIC RETROGRADE CHOLANGIOPANCREATOGRAPHY (ERCP);  Surgeon: Beryle Beams, MD;  Location: [facility];  Service: Endoscopy;  Laterality: N/A;  Dr. Benson Norway said he would start this PT arond 1330( AW)  . INCISION AND DRAINAGE Right 11/28/2015   "boil on upper arm; did this in dr's office"  . INCISION AND DRAINAGE ABSCESS Right 12/02/2015   Procedure: INCISION AND DRAINAGE ABSCESS;  Surgeon: Judeth Horn, MD;  Location: [facility];  Service: General;  Laterality: Right;  Right axillary abscess  . TUBAL LIGATION  1995     Current Outpatient Medications:  .  atorvastatin (LIPITOR) 10 MG tablet, Take 1 tablet (10 mg total) by mouth daily., Disp: 90 tablet, Rfl: 3 .  cetirizine (ZYRTEC) 10 MG tablet, TAKE 1 TABLET BY MOUTH AS NEEDED FOR  ALLERGIES, Disp: 90 tablet, Rfl: 1 .  FLUoxetine (PROZAC) 10 MG tablet, Take 1 tablet (10 mg total) by mouth daily., Disp: 30 tablet, Rfl: 3 .  hydroxychloroquine (PLAQUENIL) 200 MG tablet, Take 200 mg by mouth daily., Disp: , Rfl:  .  indapamide (LOZOL) 2.5 MG tablet, Take 1 tablet (2.5 mg total) by mouth  daily., Disp: 90 tablet, Rfl: 2 .  losartan (COZAAR) 100 MG tablet, Take 1 tablet (100 mg total) by mouth daily., Disp: 90 tablet, Rfl: 1 .  methotrexate (RHEUMATREX) 2.5 MG tablet, Take 5 tablets (12.5 mg total) by mouth once a week. Caution:Chemotherapy. Protect from light., Disp: 4 tablet, Rfl: 0 .  spironolactone (ALDACTONE) 25 MG tablet, Take 1 tablet (25 mg total) by mouth daily., Disp: 90 tablet, Rfl: 2 .  nortriptyline (PAMELOR) 10 MG capsule, Take 1 capsule by mouth at bedtime (Patient not taking: Reported on 09/07/2019), Disp: 30 capsule, Rfl: 0  The following portions of the patient's history were reviewed and updated as appropriate: allergies, current medications, past family history, past medical history, past social history, past surgical history and problem list.   Review of Systems:  Pertinent items noted in HPI and remainder of comprehensive ROS otherwise negative.   Objective:  Physical Exam BP 135/66   Pulse 60   Ht 5\' 4"  (1.626 m)   Wt 225 lb (102.1 kg)   BMI 38.62 kg/m  CONSTITUTIONAL: Well-developed, well-nourished female in no acute distress.  HENT:  Normocephalic, atraumatic. External right and left ear normal. Oropharynx is clear and moist EYES: Conjunctivae and EOM are normal. Pupils are equal, round, and reactive to light. No scleral icterus.  NECK: Normal range of motion, supple, no masses SKIN: Skin is warm and dry. No rash noted. Not diaphoretic. No erythema.  No pallor. NEUROLOGIC: Alert and oriented to person, place, and time. Normal reflexes, muscle tone coordination. No cranial nerve deficit noted. PSYCHIATRIC: Normal mood and affect. Normal behavior. Normal judgment and thought content. CARDIOVASCULAR: Normal heart rate noted RESPIRATORY: Effort normal, no problems with respiration noted ABDOMEN: Soft, no distention noted.   PELVIC: Normal appearing external genitalia; normal appearing mildly atrophic vaginal mucosa, nulliparous cervix at apex.  Scant  amount white discharge. Pap smear obtained.  pelvic cultures obtained. Mild tenderness on anterior vaginal wall, no other tenderness or masses noted.  MUSCULOSKELETAL: Normal range of motion. No edema noted.  Exam done with chaperone present.  Labs and Imaging MM Digital Screening  Result Date: 08/26/2019 CLINICAL DATA:  Screening. EXAM: DIGITAL SCREENING BILATERAL MAMMOGRAM WITH CAD COMPARISON:  Previous exam(s). ACR Breast Density Category c: The breast tissue is heterogeneously dense, which may obscure small masses. FINDINGS: There are no findings suspicious for malignancy. Images were processed with CAD. IMPRESSION: No mammographic evidence of malignancy. A result letter of this screening mammogram will be mailed directly to the patient. RECOMMENDATION: Screening mammogram in one year. (Code:SM-B-01Y) BI-RADS CATEGORY  1: Negative. Electronically Signed   By: Sande Brothers M.D.   On: 08/26/2019 10:20    Assessment & Plan:   1. Dyspareunia in female - vaginal mucosa appears healthy, no significant musculoskeletal tenderness on exam - will rule out infection, advised to try lubrication again - pain localized to anterior vaginal wall near bladder, will try diet adjustment to see if improvement since she also has urinary urgency - may need vaginal estrogen if no improvement - Cervicovaginal ancillary only - Urinalysis, Routine w reflex microscopic - Urine Culture - Cytology - PAP( [place])  2. Urinary urgency  3. Vaginal pain  4. Cervical cancer screening - Cytology - PAP( [place])  Routine preventative health maintenance measures emphasized. Please refer to After Visit Summary for other counseling recommendations.   Return in about 2 months (around 11/07/2019) for Followup.  Total face-to-face time with patient: 16 minutes. Over 50% of encounter was spent on counseling and coordination of care.  Baldemar Lenis, M.D. Attending Center for [employer] and exercise psychologist)

## 2019-09-08 LAB — CERVICOVAGINAL ANCILLARY ONLY
Bacterial Vaginitis (gardnerella): POSITIVE — AB
Candida Glabrata: NEGATIVE
Candida Vaginitis: NEGATIVE
Chlamydia: NEGATIVE
Comment: NEGATIVE
Comment: NEGATIVE
Comment: NEGATIVE
Comment: NEGATIVE
Comment: NEGATIVE
Comment: NORMAL
Neisseria Gonorrhea: NEGATIVE
Trichomonas: NEGATIVE

## 2019-09-08 MED ORDER — METRONIDAZOLE 500 MG PO TABS: 500.0000 mg | ORAL_TABLET | Freq: Two times a day (BID) | ORAL | 0 refills | Status: DC

## 2019-09-08 NOTE — Addendum Note (Signed)
Addended by: Leroy Libman on: 09/08/2019 11:47 AM   Modules accepted: Orders

## 2019-09-10 ENCOUNTER — Other Ambulatory Visit: Payer: Self-pay

## 2019-09-10 ENCOUNTER — Ambulatory Visit (INDEPENDENT_AMBULATORY_CARE_PROVIDER_SITE_OTHER): Payer: 59 | Admitting: Psychology

## 2019-09-10 DIAGNOSIS — F411 Generalized anxiety disorder: Secondary | ICD-10-CM
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
## 2019-09-11 LAB — CYTOLOGY - PAP
Comment: NEGATIVE
Diagnosis: NEGATIVE
High risk HPV: NEGATIVE

## 2019-09-24 ENCOUNTER — Other Ambulatory Visit: Payer: Self-pay | Admitting: *Deleted

## 2019-09-24 DIAGNOSIS — F321 Major depressive disorder, single episode, moderate: Secondary | ICD-10-CM

## 2019-09-24 MED ORDER — FLUOXETINE HCL 10 MG PO CAPS: 10.0000 mg | ORAL_CAPSULE | Freq: Every day | ORAL | 2 refills | Status: DC

## 2019-09-24 MED ORDER — ATORVASTATIN CALCIUM 10 MG PO TABS: 10.0000 mg | ORAL_TABLET | Freq: Every day | ORAL | 3 refills | Status: DC

## 2019-10-15 ENCOUNTER — Other Ambulatory Visit: Payer: Self-pay

## 2019-10-15 ENCOUNTER — Ambulatory Visit (INDEPENDENT_AMBULATORY_CARE_PROVIDER_SITE_OTHER): Payer: 59 | Admitting: Psychology

## 2019-10-15 ENCOUNTER — Encounter: Payer: 59 | Admitting: Psychology

## 2019-10-15 NOTE — BH Specialist Note (Signed)
Pt requested to reschedule during call 6/24 at 10AM

## 2019-10-22 ENCOUNTER — Ambulatory Visit (INDEPENDENT_AMBULATORY_CARE_PROVIDER_SITE_OTHER): Payer: 59 | Admitting: Psychology

## 2019-10-22 ENCOUNTER — Other Ambulatory Visit: Payer: Self-pay

## 2019-10-22 DIAGNOSIS — F411 Generalized anxiety disorder: Secondary | ICD-10-CM
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
## 2019-11-13 ENCOUNTER — Encounter: Payer: Self-pay | Admitting: Family Medicine

## 2019-11-13 ENCOUNTER — Ambulatory Visit (INDEPENDENT_AMBULATORY_CARE_PROVIDER_SITE_OTHER): Payer: 59 | Admitting: Family Medicine

## 2019-11-13 ENCOUNTER — Other Ambulatory Visit: Payer: Self-pay

## 2019-11-13 VITALS — BP 122/72 | HR 74 | Wt 227.6 lb

## 2019-11-13 DIAGNOSIS — I10 Essential (primary) hypertension: Secondary | ICD-10-CM | POA: Diagnosis not present

## 2019-11-13 DIAGNOSIS — F39 Unspecified mood [affective] disorder: Secondary | ICD-10-CM

## 2019-11-13 DIAGNOSIS — R7303 Prediabetes: Secondary | ICD-10-CM

## 2019-11-13 DIAGNOSIS — R7309 Other abnormal glucose: Secondary | ICD-10-CM | POA: Diagnosis not present

## 2019-11-13 DIAGNOSIS — M329 Systemic lupus erythematosus, unspecified: Secondary | ICD-10-CM

## 2019-11-13 LAB — POCT GLYCOSYLATED HEMOGLOBIN (HGB A1C): Hemoglobin A1C: 5.5 % (ref 4.0–5.6)

## 2019-11-13 MED ORDER — FLUOXETINE HCL 20 MG PO CAPS: 20.0000 mg | ORAL_CAPSULE | Freq: Every day | ORAL | 2 refills | Status: DC

## 2019-11-13 NOTE — Assessment & Plan Note (Signed)
A1C is 5.5.  Congratulated, continue dietary changes.

## 2019-11-13 NOTE — Assessment & Plan Note (Signed)
Work and sleep hygiene contributing to daily headaches. Suspect anxiety also plays role - Increase Prozac to 20 mg, discussed side effects - Discussed regular use of TCA for both headaches  - Encouraged follow up with Dr. Shawnee Knapp

## 2019-11-13 NOTE — Progress Notes (Signed)
    SUBJECTIVE:   CHIEF COMPLAINT / HPI:   Veronica [name] is a pleasant 59 year old with history of dyslipidemia, hypertension, and depression presenting for annual examination.  HTN Reports compliance with medications. Has been pleased with dietary changes for weight change and BP. Denies chest pain, dizziness, dyspnea on exertion.   HLD Reports adherence with statin. Denies myalgias.   Sleep/Depression Patient reports she has had worsening anxiety and poor sleep. She tries to avoid excess caffeine. Drinks 1-2 sodas a day, if that. 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 Then returns home to sleep 'all of the time'. Room is not particularly quiet or dark. She reports compliance with medications. She likes seeing Dr. Shawnee Knapp. She cannot move from her home until March 2022. Still living with husband. He does not regularly work. Feels safe at home. No physical or sexual abuse. She looks forward to work as well as weeks off. Sleeps for about 2 hours then wakes up intermittently. Frequently has morning headaches that resolve with Tylenol. Do not wake her up at night. Had sleep study with mild AHI in 2019.  PHQ completed, GAD is 14. Denies SI/HI.   PERTINENT  PMH / PSH/Family/Social History :  Reviewed and updated.   OBJECTIVE:   BP 122/72   Pulse 74   Wt 227 lb 9.6 oz (103.2 kg)   SpO2 97%   BMI 39.07 kg/m   HEENT: Sclera anicteric. Dentition is moderate. Appears well hydrated. Neck: Supple Cardiac: Regular rate and rhythm. Normal S1/S2. No murmurs, rubs, or gallops appreciated. Lungs: Clear bilaterally to ascultation.  Abdomen: Normoactive bowel sounds. No tenderness to deep or light palpation. No rebound or guarding.  Extremities: Warm, well perfused without edema.  Skin: Warm, dry Psych: Pleasant and appropriate    ASSESSMENT/PLAN:   Prediabetes A1C is 5.5.  Congratulated, continue dietary changes.   SLE (systemic lupus  erythematosus) Updated medication list- only on HCQ.   Essential hypertension, benign Continue current medications of indapamide, losartan, spironolactone. Labs for monitoring. BP at goal.   Mood disorder [facility]) Work and sleep hygiene contributing to daily headaches. Suspect anxiety also plays role - Increase Prozac to 20 mg, discussed side effects - Discussed regular use of TCA for both headaches  - Encouraged follow up with Dr. Shawnee Knapp    Follow up in 1 month.   [name], MD  Family Medicine Teaching Service  [facility]

## 2019-11-13 NOTE — Assessment & Plan Note (Signed)
Updated medication list- only on HCQ.

## 2019-11-13 NOTE — Assessment & Plan Note (Signed)
Continue current medications of indapamide, losartan, spironolactone. Labs for monitoring. BP at goal.

## 2019-11-13 NOTE — Patient Instructions (Signed)
It was wonderful to see you today.  Please bring ALL of your medications with you to every visit.   Today we talked about:  - Probiotics over the counter--with Lactobacillus  - Increase Prozac to 20 mg   Try Pamelor twice this week  Great work with dietary changes  Work on normalizing sleep  Follow up in 4-6 weeks- please schedule at the front   I will call you with blood work results     Thank you for choosing [facility].   Please call [phone] with any questions about today's appointment.  Please be sure to schedule follow up at the front  desk before you leave today.   [name], MD  Family Medicine

## 2019-11-14 LAB — LIPID PANEL
Chol/HDL Ratio: 2.8 ratio (ref 0.0–4.4)
Cholesterol, Total: 128 mg/dL (ref 100–199)
HDL: 45 mg/dL (ref >39)
LDL Chol Calc (NIH): 60 mg/dL (ref 0–99)
Triglycerides: 128 mg/dL (ref 0–149)
VLDL Cholesterol Cal: 23 mg/dL (ref 5–40)

## 2019-11-14 LAB — BASIC METABOLIC PANEL
BUN/Creatinine Ratio: 13 (ref 9–23)
BUN: 15 mg/dL (ref 6–24)
CO2: 26 mmol/L (ref 20–29)
Calcium: 9.7 mg/dL (ref 8.7–10.2)
Chloride: 99 mmol/L (ref 96–106)
Creatinine, Ser: 1.14 mg/dL — ABNORMAL HIGH (ref 0.57–1.00)
GFR calc Af Amer: 62 mL/min/{1.73_m2} (ref >59)
GFR calc non Af Amer: 54 mL/min/{1.73_m2} — ABNORMAL LOW (ref >59)
Glucose: 94 mg/dL (ref 65–99)
Potassium: 3.9 mmol/L (ref 3.5–5.2)
Sodium: 140 mmol/L (ref 134–144)

## 2019-11-16 ENCOUNTER — Telehealth: Payer: Self-pay | Admitting: Family Medicine

## 2019-11-16 NOTE — Telephone Encounter (Signed)
Left generic voicemail with results--Cr slightly elevated, will discuss repeating at follow up.  [name], MD  Family Medicine Teaching Service

## 2019-11-17 NOTE — Telephone Encounter (Signed)
Patient returned call to nurse line. Informed of below.   Aleksandr Pellow C Breda Bond, RN  

## 2019-11-18 ENCOUNTER — Encounter: Payer: Self-pay | Admitting: Family Medicine

## 2019-11-19 ENCOUNTER — Ambulatory Visit (INDEPENDENT_AMBULATORY_CARE_PROVIDER_SITE_OTHER): Payer: 59 | Admitting: Psychology

## 2019-11-19 ENCOUNTER — Other Ambulatory Visit: Payer: Self-pay

## 2019-11-19 DIAGNOSIS — F411 Generalized anxiety disorder: Secondary | ICD-10-CM | POA: Diagnosis not present

## 2019-11-19 NOTE — BH Specialist Note (Signed)
Integrated Behavioral Health Visit via Telemedicine (Telephone)  11/19/2019 EMIRA EUBANKS [id number]   Session Start time: 10  Session End time: 1030 Total time: 30  Referring Provider:Dr. Manson Passey Type of Visit: Telephonic Patient location: Work  [facility] Provider location: [facility] All persons participating in visit: pt and provider   The following statements were read to the patient and/or legal guardian that are established with the [employer].  "The purpose of this phone visit is to provide behavioral health care while limiting exposure to the coronavirus (COVID19).  There is a possibility of technology failure and discussed alternative modes of communication if that failure occurs."  "By engaging in this telephone visit, you consent to the provision of healthcare.  Additionally, you authorize for your insurance to be billed for the services provided during this telephone visit."     PRESENTING CONCERNS: Patient and/or family reports the following symptoms/concerns: Pt reported she has been managing her stress and shared most of her stress is in regards to her husband.  She reports he struggles with him not keeping a job and substance use.  Pt and Dr. Shawnee Knapp discussed engaging with lawyer about her resources available to her.  Pt shared that she enjoys being with her granddaughters and walking to cope with her stress. Pt also shared listening to gospel music is helpful for her.  Duration of problem: past year ; Severity of problem: mild  GOALS ADDRESSED: Patient will: 1. Reduce symptoms of: anxiety: worry and stress related to relationship 2. Increase knowledge and/or ability of: self-management skills: advocating for needs 3. Demonstrate ability to: Increase healthy adjustment to current life circumstances  INTERVENTIONS: Interventions utilized:Supportive Counseling Standardized Assessments completed:Not Needed  ASSESSMENT: Patient currently  experiencinganxiety due to life transitions especially related to relationship with husband  Patient may benefit fromsupportive therapy through marital struggles.  PLAN: 1. Follow up with behavioral health clinician on :advocating for needs in relationship 2. Behavioral recommendations:space to relieve anxiety continued. 3. Referral(s):[facility] [employer] (In Clinic)  Royetta Asal, PhD., LMFT-A

## 2019-12-05 ENCOUNTER — Emergency Department (HOSPITAL_COMMUNITY)
Admission: EM | Admit: 2019-12-05 | Discharge: 2019-12-06 | Disposition: A | Discharge status: Home / Self Care | Payer: 59 | Attending: Emergency Medicine | Admitting: Emergency Medicine

## 2019-12-05 ENCOUNTER — Emergency Department (HOSPITAL_COMMUNITY): Payer: 59

## 2019-12-05 ENCOUNTER — Other Ambulatory Visit: Payer: Self-pay

## 2019-12-05 DIAGNOSIS — Z20822 Contact with and (suspected) exposure to covid-19: Secondary | ICD-10-CM | POA: Insufficient documentation

## 2019-12-05 DIAGNOSIS — Z79899 Other long term (current) drug therapy: Secondary | ICD-10-CM | POA: Insufficient documentation

## 2019-12-05 DIAGNOSIS — R21 Rash and other nonspecific skin eruption: Secondary | ICD-10-CM | POA: Diagnosis not present

## 2019-12-05 DIAGNOSIS — I10 Essential (primary) hypertension: Secondary | ICD-10-CM | POA: Diagnosis not present

## 2019-12-05 DIAGNOSIS — G459 Transient cerebral ischemic attack, unspecified: Secondary | ICD-10-CM | POA: Insufficient documentation

## 2019-12-05 DIAGNOSIS — R29810 Facial weakness: Secondary | ICD-10-CM

## 2019-12-05 DIAGNOSIS — R4781 Slurred speech: Secondary | ICD-10-CM

## 2019-12-05 LAB — DIFFERENTIAL
Abs Immature Granulocytes: 0.04 10*3/uL (ref 0.00–0.07)
Basophils Absolute: 0 10*3/uL (ref 0.0–0.1)
Basophils Relative: 0 %
Eosinophils Absolute: 0 10*3/uL (ref 0.0–0.5)
Eosinophils Relative: 0 %
Immature Granulocytes: 0 %
Lymphocytes Relative: 9 %
Lymphs Abs: 0.9 10*3/uL (ref 0.7–4.0)
Monocytes Absolute: 0.6 10*3/uL (ref 0.1–1.0)
Monocytes Relative: 6 %
Neutro Abs: 9.2 10*3/uL — ABNORMAL HIGH (ref 1.7–7.7)
Neutrophils Relative %: 85 %

## 2019-12-05 LAB — I-STAT CHEM 8, ED
BUN: 18 mg/dL (ref 6–20)
Calcium, Ion: 1.09 mmol/L — ABNORMAL LOW (ref 1.15–1.40)
Chloride: 101 mmol/L (ref 98–111)
Creatinine, Ser: 1 mg/dL (ref 0.44–1.00)
Glucose, Bld: 154 mg/dL — ABNORMAL HIGH (ref 70–99)
HCT: 38 % (ref 36.0–46.0)
Hemoglobin: 12.9 g/dL (ref 12.0–15.0)
Potassium: 3.1 mmol/L — ABNORMAL LOW (ref 3.5–5.1)
Sodium: 141 mmol/L (ref 135–145)
TCO2: 22 mmol/L (ref 22–32)

## 2019-12-05 LAB — COMPREHENSIVE METABOLIC PANEL
ALT: 18 U/L (ref 0–44)
AST: 24 U/L (ref 15–41)
Albumin: 3.9 g/dL (ref 3.5–5.0)
Alkaline Phosphatase: 45 U/L (ref 38–126)
Anion gap: 10 (ref 5–15)
BUN: 16 mg/dL (ref 6–20)
CO2: 26 mmol/L (ref 22–32)
Calcium: 9.4 mg/dL (ref 8.9–10.3)
Chloride: 101 mmol/L (ref 98–111)
Creatinine, Ser: 1.11 mg/dL — ABNORMAL HIGH (ref 0.44–1.00)
GFR calc Af Amer: >60 mL/min (ref >60)
GFR calc non Af Amer: 55 mL/min — ABNORMAL LOW (ref >60)
Glucose, Bld: 166 mg/dL — ABNORMAL HIGH (ref 70–99)
Potassium: 3.1 mmol/L — ABNORMAL LOW (ref 3.5–5.1)
Sodium: 137 mmol/L (ref 135–145)
Total Bilirubin: 0.5 mg/dL (ref 0.3–1.2)
Total Protein: 7.8 g/dL (ref 6.5–8.1)

## 2019-12-05 LAB — CBC
HCT: 39.1 % (ref 36.0–46.0)
Hemoglobin: 12.1 g/dL (ref 12.0–15.0)
MCH: 27.7 pg (ref 26.0–34.0)
MCHC: 30.9 g/dL (ref 30.0–36.0)
MCV: 89.5 fL (ref 80.0–100.0)
Platelets: 286 10*3/uL (ref 150–400)
RBC: 4.37 MIL/uL (ref 3.87–5.11)
RDW: 12.8 % (ref 11.5–15.5)
WBC: 10.8 10*3/uL — ABNORMAL HIGH (ref 4.0–10.5)
nRBC: 0 % (ref 0.0–0.2)

## 2019-12-05 LAB — CBG MONITORING, ED
Glucose-Capillary: 125 mg/dL — ABNORMAL HIGH (ref 70–99)
Glucose-Capillary: 132 mg/dL — ABNORMAL HIGH (ref 70–99)

## 2019-12-05 LAB — PROTIME-INR
INR: 1.1 (ref 0.8–1.2)
Prothrombin Time: 13.9 seconds (ref 11.4–15.2)

## 2019-12-05 LAB — APTT: aPTT: 27 seconds (ref 24–36)

## 2019-12-05 LAB — I-STAT BETA HCG BLOOD, ED (MC, WL, AP ONLY): I-stat hCG, quantitative: <5 m[IU]/mL (ref <5)

## 2019-12-05 MED ORDER — SODIUM CHLORIDE 0.9% FLUSH
3.0000 mL | Freq: Once | INTRAVENOUS | Status: DC
Start: 2019-12-05 — End: 2019-12-06

## 2019-12-05 NOTE — Consult Note (Signed)
Requesting Physician: Dr. Jodi Mourning    Chief Complaint: Slurred speech  History obtained from: Patient and Chart     HPI:                                                                                                                                       Veronica [name] is a 59 y.o. female past medical history significant for hypertension, prediabetes, G6PD deficiency, fibromyalgia, daily headache presents emergency department with slurred speech and possible facial droop.  Last known normal was around 6 PM.  At 6:45 PM patient was on the phone and noted to have slurred speech and called EMS.  Patient also felt lightheaded and dizzy.  When EMS arrived they also noticed that she has subtle left sided facial droop and brought in as a code stroke.  Blood pressure initially was 130 systolic but later became 170 systolic.  On arrival patient had no neurological deficits.  Stat CT head was obtained which was negative for acute findings and MRI was also negative for acute infarct.  tPA was not administered due to  symptoms being resolved.      Past Medical History:  Diagnosis Date  . Daily headache   . Fibromyalgia   . G6PD deficiency    outside labs--low level, no longer on HCQ  . Hypertension   . Prediabetes   . Rheumatoid arthritis (HCC)   . SLE (systemic lupus erythematosus) (HCC) 1998    Past Surgical History:  Procedure Laterality Date  . ABDOMINAL HYSTERECTOMY  09/2008  . CESAREAN SECTION  1992, 1995  . CHOLECYSTECTOMY N/A 07/18/2012   Procedure: LAPAROSCOPIC CHOLECYSTECTOMY WITH INTRAOPERATIVE CHOLANGIOGRAM;  Surgeon: Atilano Ina, MD;  Location: [facility] OR;  Service: General;  Laterality: N/A;  . ERCP N/A 07/29/2012   Procedure: ENDOSCOPIC RETROGRADE CHOLANGIOPANCREATOGRAPHY (ERCP);  Surgeon: Theda Belfast, MD;  Location: [facility];  Service: Endoscopy;  Laterality: N/A;  Dr. Elnoria Howard said he would start this PT arond 1330( AW)  . INCISION AND DRAINAGE Right 11/28/2015   "boil on upper  arm; did this in dr's office"  . INCISION AND DRAINAGE ABSCESS Right 12/02/2015   Procedure: INCISION AND DRAINAGE ABSCESS;  Surgeon: Jimmye Norman, MD;  Location: [facility] OR;  Service: General;  Laterality: Right;  Right axillary abscess  . TUBAL LIGATION  1995    Family History  Problem Relation Age of Onset  . Diabetes Mother   . Hypertension Father   . Multiple sclerosis Sister   . Hypertension Brother    Social History:  reports that she has never smoked. She has never used smokeless tobacco. She reports that she does not drink alcohol and does not use drugs.  Allergies:  Allergies  Allergen Reactions  . Amlodipine Swelling    Leg edema  . Tessalon [Benzonatate] Other (See Comments)    Lip swelling    Medications:  I reviewed home medications   ROS:                                                                                                                                     14 systems reviewed and negative except above    Examination:                                                                                                      General: Appears well-developed and well-nourished.  Psych: Affect appropriate to situation Eyes: No scleral injection HENT: No OP obstrucion Head: Normocephalic.  Cardiovascular: Normal rate and regular rhythm.  Respiratory: Effort normal and breath sounds normal to anterior ascultation GI: Soft.  No distension. There is no tenderness.  Skin: WDI    Neurological Examination Mental Status: Alert, oriented, thought content appropriate.  Speech fluent without evidence of aphasia. Able to follow 3 step commands without difficulty. Cranial Nerves: II: Visual fields grossly normal,  III,IV, VI: ptosis not present, extra-ocular motions intact bilaterally, pupils equal, round, reactive to light and accommodation V,VII: smile  symmetric, facial light touch sensation normal bilaterally VIII: hearing normal bilaterally IX,X: uvula rises symmetrically XI: bilateral shoulder shrug XII: midline tongue extension Motor: Right : Upper extremity   5/5    Left:     Upper extremity   5/5  Lower extremity   5/5     Lower extremity   5/5 Tone and bulk:normal tone throughout; no atrophy noted Sensory: Pinprick and light touch intact throughout, bilaterally Deep Tendon Reflexes: 2+ and symmetric throughout Plantars: Right: downgoing   Left: downgoing Cerebellar: normal finger-to-nose, normal rapid alternating movements and normal heel-to-shin test Gait: normal gait and station     Lab Results: Basic Metabolic Panel: Recent Labs  Lab 12/05/19 2029/09/04 12/05/19 09/04/21  NA 141 137  K 3.1* 3.1*  CL 101 101  CO2  --  26  GLUCOSE 154* 166*  BUN 18 16  CREATININE 1.00 1.11*  CALCIUM  --  9.4    CBC: Recent Labs  Lab 12/05/19 09-04-29 12/05/19 09-04-2121  WBC  --  10.8*  NEUTROABS  --  9.2*  HGB 12.9 12.1  HCT 38.0 39.1  MCV  --  89.5  PLT  --  286    Coagulation Studies: Recent Labs    12/05/19 09/04/2121  LABPROT 13.9  INR 1.1    Imaging: CT HEAD CODE STROKE WO CONTRAST  Result Date: 12/05/2019 CLINICAL DATA:  Code stroke. Acute neuro deficit with  left-sided weakness slurred speech EXAM: CT HEAD WITHOUT CONTRAST TECHNIQUE: Contiguous axial images were obtained from the base of the skull through the vertex without intravenous contrast. COMPARISON:  CT head 07/21/2008 FINDINGS: Brain: No evidence of acute infarction, hemorrhage, hydrocephalus, extra-axial collection or mass lesion/mass effect. Vascular: Negative for hyperdense vessel Skull: Negative Sinuses/Orbits: Mild mucosal. Remaining sinuses edema in the maxillary sinus bilaterally clear. Negative orbit Other: None ASPECTS (Alberta Stroke Program Early CT Score) - Ganglionic level infarction (caudate, lentiform nuclei, internal capsule, insula, M1-M3 cortex): 7 -  Supraganglionic infarction (M4-M6 cortex): 3 Total score (0-10 with 10 being normal): 10 IMPRESSION: 1. Negative CT head 2. ASPECTS is 10 3. Preliminary report texted to Dr. Laurence Slate Electronically Signed   By: Marlan Palau M.D.   On: 12/05/2019 20:34     I have reviewed the above imaging : CT head and MRI brain normal   ASSESSMENT AND PLAN   59 y.o. female past medical history significant for hypertension, prediabetes, G6PD deficiency, fibromyalgia, daily headache presents emergency department with slurred speech and possible facial droop.  Impression Anxiety versus migraine versus TIA, though less likely to be TIA Possible TIA with low ABCD 2 score  Recommendations MRI brain without contrast performed: Acute stroke ruled out Start patient aspirin 81 mg and statin-however this could possibly be discontinued remainder stroke work-up is negative. Outpatient neurology follow-up in 2 to 4 weeks.    Arthur Speagle [facility] Pager Number [phone]

## 2019-12-05 NOTE — Code Documentation (Signed)
Responded to Code Stroke called at 1958 for L sided facial droop and slurred speech, [id number]. Pt arrived at 2015, NIH-0, CBG-125, CT head-negative. Plan for MRI.

## 2019-12-05 NOTE — ED Notes (Signed)
Pt transported to MRI 

## 2019-12-05 NOTE — ED Provider Notes (Addendum)
[facility] EMERGENCY DEPARTMENT Provider Note   CSN: [id number] Arrival date & time: 12/05/19  2015  An emergency department physician performed an initial assessment on this suspected stroke patient at 2016.  History Chief Complaint  Patient presents with  . Code Stroke    Veronica [name] is a 59 y.o. female with hx of SLE, RA, G6PD deficiency, FM, HTN, prediabetes presents with left sided facial droop and slurred speech that patient noticed. Last seen well at 1800. Last heard well at 1845. Patient reports that she noticed slurred speech and told her supervisor that she wasn't feeling well. She is tearful on exam and reports that she is very nervous. Denies fever, chills, n/v, pain, tingling, numbness.      Past Medical History:  Diagnosis Date  . Daily headache   . Fibromyalgia   . G6PD deficiency    outside labs--low level, no longer on HCQ  . Hypertension   . Prediabetes   . Rheumatoid arthritis (HCC)   . SLE (systemic lupus erythematosus) (HCC) 1998    Patient Active Problem List   Diagnosis Date Noted  . Mood disorder (HCC) 11/13/2019  . Atherosclerosis of aorta (HCC) 07/15/2019  . Prediabetes 07/13/2019  . Chronic abdominal pain 07/13/2019  . Screening-pulmonary TB 02/21/2018  . Insomnia 12/15/2012  . Essential hypertension, benign 04/10/2012  . SLE (systemic lupus erythematosus) (HCC) 04/10/2012    Past Surgical History:  Procedure Laterality Date  . ABDOMINAL HYSTERECTOMY  09/2008  . CESAREAN SECTION  1992, 1995  . CHOLECYSTECTOMY N/A 07/18/2012   Procedure: LAPAROSCOPIC CHOLECYSTECTOMY WITH INTRAOPERATIVE CHOLANGIOGRAM;  Surgeon: Atilano Ina, MD;  Location: [facility] OR;  Service: General;  Laterality: N/A;  . ERCP N/A 07/29/2012   Procedure: ENDOSCOPIC RETROGRADE CHOLANGIOPANCREATOGRAPHY (ERCP);  Surgeon: Theda Belfast, MD;  Location: [facility];  Service: Endoscopy;  Laterality: N/A;  Dr. Elnoria Howard said he would start this PT arond 1330( AW)  .  INCISION AND DRAINAGE Right 11/28/2015   "boil on upper arm; did this in dr's office"  . INCISION AND DRAINAGE ABSCESS Right 12/02/2015   Procedure: INCISION AND DRAINAGE ABSCESS;  Surgeon: Jimmye Norman, MD;  Location: [facility] OR;  Service: General;  Laterality: Right;  Right axillary abscess  . TUBAL LIGATION  1995     OB History    Gravida  2   Para  2   Term  2   Preterm  0   AB  0   Living  2     SAB  0   TAB  0   Ectopic  0   Multiple  0   Live Births  2           Family History  Problem Relation Age of Onset  . Diabetes Mother   . Hypertension Father   . Multiple sclerosis Sister   . Hypertension Brother     Social History   Tobacco Use  . Smoking status: Never Smoker  . Smokeless tobacco: Never Used  Vaping Use  . Vaping Use: Never used  Substance Use Topics  . Alcohol use: No  . Drug use: No    Home Medications Prior to Admission medications   Medication Sig Start Date End Date Taking? Authorizing Provider  atorvastatin (LIPITOR) 10 MG tablet Take 1 tablet (10 mg total) by mouth daily. 09/24/19  Yes Westley Chandler, MD  cetirizine (ZYRTEC) 10 MG tablet TAKE 1 TABLET BY MOUTH AS NEEDED FOR  ALLERGIES Patient taking differently: Take  10 mg by mouth daily as needed for allergies.  05/15/19  Yes Westley Chandler, MD  FLUoxetine (PROZAC) 20 MG capsule Take 1 capsule (20 mg total) by mouth daily. 11/13/19  Yes Westley Chandler, MD  hydroxychloroquine (PLAQUENIL) 200 MG tablet Take 400 mg by mouth at bedtime.    Yes [provider]  indapamide (LOZOL) 2.5 MG tablet Take 1 tablet (2.5 mg total) by mouth daily. 05/15/19  Yes Westley Chandler, MD  losartan (COZAAR) 100 MG tablet Take 1 tablet (100 mg total) by mouth daily. 05/15/19  Yes Westley Chandler, MD  spironolactone (ALDACTONE) 25 MG tablet Take 1 tablet (25 mg total) by mouth daily. 07/04/18  Yes Westley Chandler, MD  methotrexate (RHEUMATREX) 2.5 MG tablet Take 5 tablets (12.5 mg total) by mouth once a  week. Caution:Chemotherapy. Protect from light. Patient not taking: Reported on 11/13/2019 07/13/19   Westley Chandler, MD  nortriptyline (PAMELOR) 10 MG capsule Take 1 capsule by mouth at bedtime Patient not taking: Reported on 09/07/2019 04/02/19   Drema Dallas, DO    Allergies    Amlodipine and Tessalon [benzonatate]  Review of Systems   Review of Systems  Constitutional: Negative for activity change, chills and fever.  HENT: Negative for congestion, rhinorrhea and sore throat.   Respiratory: Negative for shortness of breath.   Cardiovascular: Negative for chest pain.  Gastrointestinal: Negative for abdominal distention and abdominal pain.  Endocrine: Negative.   Genitourinary: Negative.   Musculoskeletal: Negative for arthralgias and neck pain.  Skin: Positive for rash.  Neurological: Negative for dizziness, syncope and speech difficulty.  Hematological: Negative.   Psychiatric/Behavioral: Negative.     Physical Exam Updated Vital Signs BP (!) 168/77 (BP Location: Left Arm)   Pulse 96   Temp 98.5 F (36.9 C) (Oral)   Resp (!) 21   Ht 5\' 4"  (1.626 m)   Wt 103.3 kg   SpO2 100%   BMI 39.09 kg/m   Physical Exam Constitutional:      General: She is in acute distress.     Appearance: Normal appearance. She is obese. She is not diaphoretic.     Comments: Very anxious appearing and tearful.  HENT:     Head: Normocephalic and atraumatic.     Nose: Nose normal.     Mouth/Throat:     Mouth: Mucous membranes are moist.     Pharynx: Oropharynx is clear.  Eyes:     General: No visual field deficit.    Extraocular Movements: Extraocular movements intact.     Conjunctiva/sclera: Conjunctivae normal.     Pupils: Pupils are equal, round, and reactive to light.  Cardiovascular:     Rate and Rhythm: Normal rate and regular rhythm.     Pulses: Normal pulses.  Pulmonary:     Effort: Pulmonary effort is normal.     Breath sounds: Normal breath sounds.  Abdominal:     General:  Abdomen is flat. Bowel sounds are normal. There is no distension.     Tenderness: There is no abdominal tenderness.  Musculoskeletal:        General: No swelling or deformity. Normal range of motion.     Cervical back: Normal range of motion and neck supple. No tenderness.  Lymphadenopathy:     Cervical: No cervical adenopathy.  Skin:    General: Skin is warm.     Capillary Refill: Capillary refill takes less than 2 seconds.     Findings: No bruising.  Comments: Malar rash  Neurological:     General: No focal deficit present.     Mental Status: She is alert and oriented to person, place, and time.     GCS: GCS eye subscore is 4. GCS verbal subscore is 5. GCS motor subscore is 6.     Cranial Nerves: Cranial nerves are intact. No cranial nerve deficit, dysarthria or facial asymmetry.     Sensory: Sensation is intact.     Motor: Motor function is intact. No pronator drift.     Coordination: Coordination is intact. Romberg sign negative.     Gait: Gait is intact.     Deep Tendon Reflexes: Reflexes are normal and symmetric.     Comments: Spells WORLD backwards: DLORW. Serial 7's x 1. Repeats no ifs ands or buts without issue.   Psychiatric:        Mood and Affect: Mood normal.        Behavior: Behavior normal.        Thought Content: Thought content normal.        Judgment: Judgment normal.     ED Results / Procedures / Treatments   Labs (all labs ordered are listed, but only abnormal results are displayed) Labs Reviewed  CBC - Abnormal; Notable for the following components:      Result Value   WBC 10.8 (*)    All other components within normal limits  DIFFERENTIAL - Abnormal; Notable for the following components:   Neutro Abs 9.2 (*)    All other components within normal limits  COMPREHENSIVE METABOLIC PANEL - Abnormal; Notable for the following components:   Potassium 3.1 (*)    Glucose, Bld 166 (*)    Creatinine, Ser 1.11 (*)    GFR calc non Af Amer 55 (*)    All other  components within normal limits  I-STAT CHEM 8, ED - Abnormal; Notable for the following components:   Potassium 3.1 (*)    Glucose, Bld 154 (*)    Calcium, Ion 1.09 (*)    All other components within normal limits  CBG MONITORING, ED - Abnormal; Notable for the following components:   Glucose-Capillary 125 (*)    All other components within normal limits  SARS CORONAVIRUS 2 BY RT PCR (HOSPITAL ORDER, PERFORMED IN  HOSPITAL LAB)  PROTIME-INR  APTT  I-STAT BETA HCG BLOOD, ED ([facility], [facility], [facility] ONLY)    EKG None  Radiology MR BRAIN WO CONTRAST  Result Date: 12/05/2019 CLINICAL DATA:  Facial droop and slurred speech. EXAM: MRI HEAD WITHOUT CONTRAST TECHNIQUE: Multiplanar, multiecho pulse sequences of the brain and surrounding structures were obtained without intravenous contrast. COMPARISON:  None. FINDINGS: BRAIN: No acute infarct, acute hemorrhage or extra-axial collection. Normal white matter signal. Normal volume of CSF spaces. No chronic microhemorrhage. Normal midline structures. VASCULAR: Major flow voids are preserved. SKULL AND UPPER CERVICAL SPINE: Normal calvarium and skull base. Visualized upper cervical spine and soft tissues are normal. SINUSES/ORBITS: No paranasal sinus fluid levels or advanced mucosal thickening. No mastoid or middle ear effusion. Normal orbits. IMPRESSION: Normal brain MRI. Electronically Signed   By: Deatra Gironda M.D.   On: 12/05/2019 23:25   CT HEAD CODE STROKE WO CONTRAST  Result Date: 12/05/2019 CLINICAL DATA:  Code stroke. Acute neuro deficit with left-sided weakness slurred speech EXAM: CT HEAD WITHOUT CONTRAST TECHNIQUE: Contiguous axial images were obtained from the base of the skull through the vertex without intravenous contrast. COMPARISON:  CT head 07/21/2008 FINDINGS: Brain: No  evidence of acute infarction, hemorrhage, hydrocephalus, extra-axial collection or mass lesion/mass effect. Vascular: Negative for hyperdense vessel Skull: Negative  Sinuses/Orbits: Mild mucosal. Remaining sinuses edema in the maxillary sinus bilaterally clear. Negative orbit Other: None ASPECTS (Alberta Stroke Program Early CT Score) - Ganglionic level infarction (caudate, lentiform nuclei, internal capsule, insula, M1-M3 cortex): 7 - Supraganglionic infarction (M4-M6 cortex): 3 Total score (0-10 with 10 being normal): 10 IMPRESSION: 1. Negative CT head 2. ASPECTS is 10 3. Preliminary report texted to Dr. Laurence Slate Electronically Signed   By: Marlan Palau M.D.   On: 12/05/2019 20:34    Procedures Procedures (including critical care time)  Medications Ordered in ED Medications  sodium chloride flush (NS) 0.9 % injection 3 mL (has no administration in time range)    ED Course  I have reviewed the triage vital signs and the nursing notes.  Pertinent labs & imaging results that were available during my care of the patient were reviewed by me and considered in my medical decision making (see chart for details).     MDM Rules/Calculators/A&P                          59 y.o F with pmhx s/f SLE, RA, HTN,  G6PD deficiency, FM, prediabetes. anxiety presenting with left sided facial weakness and dysarthria. No previous history of stroke or TIA. NIH 0. Ct head with no acute abnormalities. No FND on physical exam. Patient is able to get up and walk to the bathroom. Initial istat labs s/f hypokalemia to 3.1 and mildly low iCa. Remainder of labs pending. Per office notes, recent worsening headaches attributed to sleep and work. Given high risk comorbidities, will continue to observe and obtain MRI. If MRI negative, patient will likely be able to go home pending further neuro recommendations.  MRI was negative. Re-evaluation of patient shows no change in neuro exam. Patient blood pressure stable (128/72) and VS stable. Neurology okay with patient going home given negative work up.  Follow up in [facility] scheduled. Return precautions provided.    Final Clinical  Impression(s) / ED Diagnoses Final diagnoses:  Slurred speech  Facial droop  TIA (transient ischemic attack)    Rx / DC Orders ED Discharge Orders    None       Melene Plan, MD 12/05/19 2257    Melene Plan, MD 12/05/19 2332    Melene Plan, MD 12/05/19 2352    Blane Ohara, MD 12/06/19 0006

## 2019-12-05 NOTE — ED Triage Notes (Signed)
BIB by [facility] after family reports pt having left sided facial droop and slurred speech. LKW 1800. Pt has hx of HTN, lupus. On arrival, pt symptoms resolved. B/P 168/77, HR 96, 02 sat 100.

## 2019-12-06 LAB — SARS CORONAVIRUS 2 BY RT PCR (HOSPITAL ORDER, PERFORMED IN ~~LOC~~ HOSPITAL LAB): SARS Coronavirus 2: NEGATIVE

## 2019-12-06 NOTE — ED Provider Notes (Signed)
  [facility] EMERGENCY DEPARTMENT Provider Note   Spoke to Dr. Laurence Slate about this patient after signing previous provider note. At discharge, diagnosis still unclear, but CVA was ruled out. Given possible TIA, will discharge patient with ASA and follow up with neurology outpatient. Patient already taking atorvastatin 10. Could consider increasing to high intensity; will defer to PCP/neurology.   Patient follows with [facility] and will call to make an appt for f/u within 2-4 weeks.   Spoke to patient over the phone and confirmed. Patient agreeable to start taking ASA 81 and to make appointment with [facility] Neurology.   Melene Plan, M.D.  12:56 AM 12/06/2019     Melene Plan, MD 12/06/19 [id number]    Marily Memos, MD 12/06/19 [phone]

## 2019-12-06 NOTE — ED Notes (Signed)
Patient verbalizes understanding of discharge instructions. Opportunity for questioning and answers were provided. Armband removed by staff, pt discharged from ED via wheelchair to go home with husband.

## 2019-12-08 ENCOUNTER — Ambulatory Visit (INDEPENDENT_AMBULATORY_CARE_PROVIDER_SITE_OTHER): Payer: 59 | Admitting: Family Medicine

## 2019-12-08 ENCOUNTER — Other Ambulatory Visit: Payer: Self-pay

## 2019-12-08 VITALS — BP 124/68 | HR 67 | Wt 224.6 lb

## 2019-12-08 DIAGNOSIS — E785 Hyperlipidemia, unspecified: Secondary | ICD-10-CM | POA: Diagnosis not present

## 2019-12-08 DIAGNOSIS — Z8673 Personal history of transient ischemic attack (TIA), and cerebral infarction without residual deficits: Secondary | ICD-10-CM | POA: Diagnosis not present

## 2019-12-08 DIAGNOSIS — Z09 Encounter for follow-up examination after completed treatment for conditions other than malignant neoplasm: Secondary | ICD-10-CM | POA: Insufficient documentation

## 2019-12-08 DIAGNOSIS — R232 Flushing: Secondary | ICD-10-CM | POA: Diagnosis not present

## 2019-12-08 DIAGNOSIS — I10 Essential (primary) hypertension: Secondary | ICD-10-CM

## 2019-12-08 DIAGNOSIS — F39 Unspecified mood [affective] disorder: Secondary | ICD-10-CM

## 2019-12-08 MED ORDER — ASPIRIN EC 81 MG PO TBEC: 81.0000 mg | DELAYED_RELEASE_TABLET | Freq: Every day | ORAL | 11 refills | Status: AC

## 2019-12-08 MED ORDER — ATORVASTATIN CALCIUM 40 MG PO TABS: 40.0000 mg | ORAL_TABLET | Freq: Every day | ORAL | 3 refills | Status: DC

## 2019-12-08 NOTE — Progress Notes (Signed)
    SUBJECTIVE:   CHIEF COMPLAINT / HPI:   Veronica [name] is a 59 yo F who presents for the below issues.   Hospital f/u, c/f TIA Seen in the ED 12/05/2019 for slurred speech and facial droop. They ruled out a stroke but there was a suspicion for a TIA. She was prescribed ASA 81 mg but has not started it. She feels like she is back to her baseline but continues to be nervous about what happened. This has never happened to her before. She is also experiencing a heated sensation all over her body that self resolves.   Hypertension Endorses continues compliance with current BP medications. Denies headaches, shortness of breath, and vision changes.    PERTINENT  PMH / PSH: Hx of hysterectomy 2010 (subsequent history of hot flashes), Mood disorder (Last visit w/ Dr. Manson Passey 11/13/2019 increase prozac to 20 mg daily; sees Dr. Shawnee [name] regularly)  OBJECTIVE:   BP 124/68   Pulse 67   Wt 224 lb 9.6 oz (101.9 kg)   SpO2 99%   BMI 38.55 kg/m   General: Appears anxious, no acute distress. Age appropriate. Cardiac: RRR, normal heart sounds, no murmurs Respiratory: CTAB, normal effort Neuro: alert and oriented, no focal deficits. CN II-XII intact.   CT HEAD WITHOUT CONTRAST  TECHNIQUE: Contiguous axial images were obtained from the base of the skull through the vertex without intravenous contrast.  COMPARISON:  CT head 07/21/2008  FINDINGS: Brain: No evidence of acute infarction, hemorrhage, hydrocephalus, extra-axial collection or mass lesion/mass effect.  Vascular: Negative for hyperdense vessel  Skull: Negative  Sinuses/Orbits: Mild mucosal. Remaining sinuses edema in the maxillary sinus bilaterally clear. Negative orbit  Other: None  ASPECTS (Alberta Stroke Program Early CT Score)  - Ganglionic level infarction (caudate, lentiform nuclei, internal capsule, insula, M1-M3 cortex): 7  - Supraganglionic infarction (M4-M6 cortex): 3  Total score (0-10 with 10 being  normal): 10  IMPRESSION: 1. Negative CT head 2. ASPECTS is 10 3. Preliminary report texted to Dr. Laurence [name]  Electronically Signed   By: Veronica [name] M.D.   On: 12/05/2019 20:34  MRI HEAD WITHOUT CONTRAST  TECHNIQUE: Multiplanar, multiecho pulse sequences of the brain and surrounding structures were obtained without intravenous contrast.  COMPARISON:  None.  FINDINGS: BRAIN: No acute infarct, acute hemorrhage or extra-axial collection. Normal white matter signal. Normal volume of CSF spaces. No chronic microhemorrhage. Normal midline structures.  VASCULAR: Major flow voids are preserved.  SKULL AND UPPER CERVICAL SPINE: Normal calvarium and skull base. Visualized upper cervical spine and soft tissues are normal.  SINUSES/ORBITS: No paranasal sinus fluid levels or advanced mucosal thickening. No mastoid or middle ear effusion. Normal orbits.  IMPRESSION: Normal brain MRI.  Electronically Signed   By: Veronica [name] M.D.   On: 12/05/2019 23:25  ASSESSMENT/PLAN:   History of TIA (transient ischemic attack) Stable. Back to baseline. No evidence of Acute CVA.  -Start low dose ASA daily -Start lipitor 40 mg daily for secondary prevention -Follow up with [facility] (established patient)  Essential hypertension, benign Stable on current medication regimen. Continue.   Mood disorder (HCC) Acutely nervous due to traumatic event.  -Continue prozac 20 mg daily.  -Has scheduled f/u w/ Dr. Shawnee [name]  Hot flashes Chronic. Intermittent. Likely a manifestation of hormonal imbalance due to hysterectomy v. Autonomic disorder. TSH wnl.  -Follow up as needed    Veronica Jumbo, DO [facility]

## 2019-12-08 NOTE — Patient Instructions (Addendum)
It was very nice to meet you today. Please enjoy the rest of your week. Today you were seen for a hospital follow-up.  Please make a follow-up appointment with the neurologist today. I have prescribed aspirin 81 mg to be taken daily.  I have also changed your statin to Lipitor 40 mg daily. These were sent to your pharmacy. Follow up with PCP.  Continue follow-up with Dr. Shawnee Knapp.  Resources to use to relax your anxiety; Headspace (this is an app for your phone) [email or web address]   Please call the clinic at [phone] if your symptoms worsen or you have any concerns. It was our pleasure to serve you.

## 2019-12-09 LAB — TSH: TSH: 1.15 u[IU]/mL (ref 0.450–4.500)

## 2019-12-10 ENCOUNTER — Encounter: Payer: Self-pay | Admitting: Family Medicine

## 2019-12-10 DIAGNOSIS — R4689 Other symptoms and signs involving appearance and behavior: Secondary | ICD-10-CM | POA: Insufficient documentation

## 2019-12-10 DIAGNOSIS — Z8673 Personal history of transient ischemic attack (TIA), and cerebral infarction without residual deficits: Secondary | ICD-10-CM | POA: Insufficient documentation

## 2019-12-10 NOTE — Assessment & Plan Note (Signed)
Acutely nervous due to traumatic event.  -Continue prozac 20 mg daily.  -Has scheduled f/u w/ Dr. Shawnee Knapp

## 2019-12-10 NOTE — Assessment & Plan Note (Signed)
Stable on current medication regimen. Continue.

## 2019-12-10 NOTE — Assessment & Plan Note (Signed)
Stable. Back to baseline. No evidence of Acute CVA.  -Start low dose ASA daily -Start lipitor 40 mg daily for secondary prevention -Follow up with [facility] (established patient)

## 2019-12-10 NOTE — Progress Notes (Signed)
NEUROLOGY FOLLOW UP OFFICE NOTE  VERSA CRATON [id number]  HISTORY OF PRESENT ILLNESS: Veronica [name] is a 59 year old right-handed African-American woman with SLE, hypertension, and allergic rhinitis who follows up for new issue, TIA.  She was seen in the ED at [facility] on 12/05/2019 for left sided facial droop and slurred speech.  She was on the phone when she started feeling lightheaded and noted slurred speech.  She called 911 and when EMS arrived, they noticed subtle left sided facial droop.  She had an associated severe pounding frontal headache.  No unilateral numbness or weakness of extremities. She presented as a code stroke.  Blood pressure initially 130 systolic but later 170.  No objective neurologic signs on exam in the ED (NIHSS 0).  CT and MRI of brain personally reviewed were unremarkable.  tPA was not administered due to resolution of symptoms.  She was discharged on ASA 81mg  daily.  Since then, she has had a daily headache.  She also has feeling of heat in her body and has increased nervousness and shakiness.  She feels off balance.  When she closes her eyes, she sees darkness that is moving quickly across her visual field.  Due to increased anxiety, she was started on Prozac.  Lipitor was increased from 10mg  to 40mg  daily.  She has history of headaches since 2012-2013, described as moderate bifrontal non-throbbing headache with slight dizziness.  She has a known partially empty sella on MRI of brain with and without contrast from 04/08/15 and MRI of brain without contrast on 07/12/11.  She has her eyes examined annually.  No significant issues.    Labs from July:  Hgb A1c 5.5; LDL 60 Labs from August:  SARS Coronvirus 2 negative; TSH 1.150  Current NSAIDS:  Naproxen 500mg   Current analgesics:  Tylenol Current triptans:  none Current ergotamine:  none Current anti-emetic:  none Current muscle relaxants:  Flexeril  Current anti-anxiolytic:  none Current sleep  aide:  none Current Antihypertensive medications:  Losartan, indapamide, spironolactone Current Antidepressant medications:  none Current Anticonvulsant medications:  none Current anti-CGRP:  none Current Vitamins/Herbal/Supplements:  none Current Antihistamines/Decongestants:  Zyrtec Other therapy:  Vicks vapor rub to forehead Other medication:  Plaquenil  Past NSAIDS:  ibuprofen Past analgesics:  Excedrin Past abortive triptans:  none Past abortive ergotamine:  none Past muscle relaxants:  none Past anti-emetic:  none Past antihypertensive medications:  none Past antidepressant medications:  none Past anticonvulsant medications:  none Past anti-CGRP:  none Past vitamins/Herbal/Supplements:  none Past antihistamines/decongestants:  none Other past therapies:  none  Caffeine:  No coffee.  Sometimes Coke Diet:  Drinks 3 bottles of 16.9 oz water daily.  1 can of Fanta Orange or 07/14/11 Exercise:  no Depression:  no; Anxiety:  Increased work-related anxiety Other pain:  no Sleep hygiene:  Poor.  Diagnosed with mild OSA in 2019. Instructed to lose weight and follow up.  She reports having lost weight but sleep has not improved.   Sister has MS.  PAST MEDICAL HISTORY: Past Medical History:  Diagnosis Date  . Daily headache   . Fibromyalgia   . G6PD deficiency    outside labs--low level, no longer on HCQ  . Hypertension   . Prediabetes   . Rheumatoid arthritis (HCC)   . SLE (systemic lupus erythematosus) (HCC) 1998    MEDICATIONS: Current Outpatient Medications on File Prior to Visit  Medication Sig Dispense Refill  . aspirin EC 81 MG tablet Take 1 tablet (  81 mg total) by mouth daily. Swallow whole. 30 tablet 11  . atorvastatin (LIPITOR) 40 MG tablet Take 1 tablet (40 mg total) by mouth daily. 90 tablet 3  . cetirizine (ZYRTEC) 10 MG tablet TAKE 1 TABLET BY MOUTH AS NEEDED FOR  ALLERGIES (Patient taking differently: Take 10 mg by mouth daily as needed for allergies.  ) 90 tablet 1  . FLUoxetine (PROZAC) 20 MG capsule Take 1 capsule (20 mg total) by mouth daily. 90 capsule 2  . hydroxychloroquine (PLAQUENIL) 200 MG tablet Take 400 mg by mouth at bedtime.     . indapamide (LOZOL) 2.5 MG tablet Take 1 tablet (2.5 mg total) by mouth daily. 90 tablet 2  . losartan (COZAAR) 100 MG tablet Take 1 tablet (100 mg total) by mouth daily. 90 tablet 1  . methotrexate (RHEUMATREX) 2.5 MG tablet Take 5 tablets (12.5 mg total) by mouth once a week. Caution:Chemotherapy. Protect from light. (Patient not taking: Reported on 11/13/2019) 4 tablet 0  . nortriptyline (PAMELOR) 10 MG capsule Take 1 capsule by mouth at bedtime (Patient not taking: Reported on 09/07/2019) 30 capsule 0  . spironolactone (ALDACTONE) 25 MG tablet Take 1 tablet (25 mg total) by mouth daily. 90 tablet 2   No current facility-administered medications on file prior to visit.    ALLERGIES: Allergies  Allergen Reactions  . Amlodipine Swelling    Leg edema  . Tessalon [Benzonatate] Other (See Comments)    Lip swelling    FAMILY HISTORY: Family History  Problem Relation Age of Onset  . Diabetes Mother   . Hypertension Father   . Multiple sclerosis Sister   . Hypertension Brother     SOCIAL HISTORY: Social History   Socioeconomic History  . Marital status: Married    Spouse name: Cedric  . Number of children: 2  . Years of education: Not on file  . Highest education level: Some college, no degree  Occupational History    Employer: [employer]  Tobacco Use  . Smoking status: Never Smoker  . Smokeless tobacco: Never Used  Vaping Use  . Vaping Use: Never used  Substance and Sexual Activity  . Alcohol use: No  . Drug use: No  . Sexual activity: Yes    Partners: Male    Birth control/protection: Surgical  Other Topics Concern  . Not on file  Social History Narrative   Pt lives with spouse. Some college.    Works 7 days on then 7 days off at place of work       Patient is right-handed.  She lives with her husband in a one level home. She occasionally drinks soda. She does not exercise.    Social Determinants of Health   Financial Resource Strain:   . Difficulty of Paying Living Expenses:   Food Insecurity: No Food Insecurity  . Worried About Programme researcher, broadcasting/film/video in the Last Year: Never true  . Ran Out of Food in the Last Year: Never true  Transportation Needs: No Transportation Needs  . Lack of Transportation (Medical): No  . Lack of Transportation (Non-Medical): No  Physical Activity:   . Days of Exercise per Week:   . Minutes of Exercise per Session:   Stress:   . Feeling of Stress :   Social Connections:   . Frequency of Communication with Friends and Family:   . Frequency of Social Gatherings with Friends and Family:   . Attends Religious Services:   . Active Member of Clubs or  Organizations:   . Attends Banker Meetings:   Marland Kitchen Marital Status:   Intimate Partner Violence:   . Fear of Current or Ex-Partner:   . Emotionally Abused:   Marland Kitchen Physically Abused:   . Sexually Abused:     PHYSICAL EXAM: Blood pressure 130/85, pulse 74, height 5\' 4"  (1.626 m), weight 224 lb (101.6 kg), SpO2 100 %. General: No acute distress.  Patient appears well-groomed.   Head:  Normocephalic/atraumatic Eyes:  Fundi examined but not visualized Neck: supple, no paraspinal tenderness, full range of motion Heart:  Regular rate and rhythm Lungs:  Clear to auscultation bilaterally Back: No paraspinal tenderness Neurological Exam: alert and oriented to person, place, and time. Attention span and concentration intact, recent and remote memory intact, fund of knowledge intact.  Speech fluent and not dysarthric, language intact.  CN II-XII intact. Bulk and tone normal, muscle strength 5/5 throughout.  Sensation to light touch, temperature and vibration intact.  Deep tendon reflexes 2+ throughout, toes downgoing.  Finger to nose and heel to shin testing intact.  Gait normal, Romberg  negative.  IMPRESSION: 1.  Transient ischemic attack.   2.  Chronic tension-type headache 3.  Hypertension  PLAN: 1.  Continue ASA 81mg  daily for secondary stroke prevention 2.  LDL goal less than 70.  As LDL last month was 60, will have her reduce atorvastatin dose back to 10mg  daily. 3.  Continue blood pressure management (goal less than 130/90) 4.  Glycemic control (Hgb A1c goal less than 7) 5.  For headache, start topiramate 25mg  at bedtime for a week, then 50mg  at bedtime.  If headaches not improved in 5 to 6 weeks, we can increase to 75mg  at bedtime. 6.  Stroke workup:  - CTA of head and neck  - Echocardiogram  - Cardiac event monitor 7.  Limit use of pain relievers to no more than 2 days out of week to prevent risk of rebound or medication-overuse headache. 8.  Follow up in 4 to 6 months.  , DO  CC: , MD

## 2019-12-10 NOTE — Assessment & Plan Note (Signed)
Chronic. Intermittent. Likely a manifestation of hormonal imbalance due to hysterectomy v. Autonomic disorder. TSH wnl.  -Follow up as needed

## 2019-12-11 ENCOUNTER — Ambulatory Visit (INDEPENDENT_AMBULATORY_CARE_PROVIDER_SITE_OTHER): Payer: 59 | Admitting: Neurology

## 2019-12-11 ENCOUNTER — Encounter: Payer: Self-pay | Admitting: Neurology

## 2019-12-11 ENCOUNTER — Other Ambulatory Visit: Payer: Self-pay

## 2019-12-11 VITALS — BP 130/85 | HR 74 | Ht 64.0 in | Wt 224.0 lb

## 2019-12-11 DIAGNOSIS — I10 Essential (primary) hypertension: Secondary | ICD-10-CM

## 2019-12-11 DIAGNOSIS — G459 Transient cerebral ischemic attack, unspecified: Secondary | ICD-10-CM | POA: Diagnosis not present

## 2019-12-11 DIAGNOSIS — G44229 Chronic tension-type headache, not intractable: Secondary | ICD-10-CM | POA: Diagnosis not present

## 2019-12-11 MED ORDER — TOPIRAMATE 25 MG PO TABS
ORAL_TABLET | ORAL | 0 refills | Status: DC
Start: 2019-12-11 — End: 2020-01-25

## 2019-12-11 NOTE — Patient Instructions (Addendum)
1.  Start topiramate 25mg  at bedtime for one week, then 50mg  at bedtime.  Contact me for refill and update. 2.  Continue aspirin 81mg  daily 3.  Decrease atorvastatin back to 10mg  daily 4.  Continue blood pressure medications 5.  Check CTA of head and neck. We have sent a referral to [facility] for your CTA and they will call you directly to schedule your appointment. They are located at [address] [facility]. If you need to contact them directly please call [phone].  6.  Check echocardiogram 7.  Check 3 week cardiac event monitor.  [facility]. [phone] 8.  Follow up in 4 to 6 months.

## 2019-12-14 ENCOUNTER — Ambulatory Visit (INDEPENDENT_AMBULATORY_CARE_PROVIDER_SITE_OTHER): Payer: 59 | Admitting: Family Medicine

## 2019-12-14 ENCOUNTER — Encounter: Payer: Self-pay | Admitting: Family Medicine

## 2019-12-14 ENCOUNTER — Other Ambulatory Visit: Payer: Self-pay

## 2019-12-14 DIAGNOSIS — Z8673 Personal history of transient ischemic attack (TIA), and cerebral infarction without residual deficits: Secondary | ICD-10-CM | POA: Diagnosis not present

## 2019-12-14 DIAGNOSIS — I700 Atherosclerosis of aorta: Secondary | ICD-10-CM | POA: Diagnosis not present

## 2019-12-14 MED ORDER — ATORVASTATIN CALCIUM 10 MG PO TABS
10.0000 mg | ORAL_TABLET | Freq: Every day | ORAL | 3 refills | Status: DC
Start: 2019-12-14 — End: 2020-12-28

## 2019-12-14 NOTE — Patient Instructions (Addendum)
It was wonderful to see you today.  Please bring ALL of your medications with you to every visit.   Today we talked about:  - Reducing your atorvastatin to 10 mg per Dr. Everlena Cooper  Call the heart doctor to schedule your ultrasound and monitor [phone].      Thank you for choosing [facility].   Please call [phone] with any questions about today's appointment.  Please be sure to schedule follow up at the front  desk before you leave today.   [name], MD  Family Medicine

## 2019-12-14 NOTE — Assessment & Plan Note (Signed)
Discussed next steps again--number for [facility] given to patient to schedule. This has worsened her anxiety, discussed, has follow up with Dr. Shawnee Knapp.

## 2019-12-14 NOTE — Progress Notes (Signed)
    SUBJECTIVE:   CHIEF COMPLAINT / HPI:   Veronica [name] is a pleasant 59 year old woman with history of mood disorder, hypertension, dyslipidemia, and recent TIA presenting for follow up.   Mood Disorder Primarily has anxiety, also component of depression. Sleep has worsened since last visit due to stress over TIA. Her job is reducing her hours to two days a week then one weekend a month. She reports some improvement with Prozac. She has not yet started Topamax. Reports some anxiety around having another TIA--has been keeping diary of symptoms which she finds useful. Reviewed with her--symptoms have improved. Feels anxiety is under better control than last week--has support from family. Husband is looking for work again. She is hopeful he finds something that he likes.   TIA Reviewed recent imaging. She reports no further symptoms. Endorses mild ongoing headache. Denies numbness, change in speech, facial droop. No recurrence of symptoms.   PERTINENT  PMH / PSH/Family/Social History : Reviewed and updated   OBJECTIVE:   BP 128/80   Pulse 67   Wt 225 lb 9.6 oz (102.3 kg)   SpO2 94%   BMI 38.72 kg/m   HEENT: Sclera anicteric. Dentition is moderate. Appears well hydrated. Neck: Supple Cardiac: Regular rate and rhythm. Normal S1/S2. No murmurs, rubs, or gallops appreciated. Lungs: Clear bilaterally to ascultation.   Neuro: CN II: PERRL CN III, IV,VI: EOMI CV V: Normal sensation in V1, V2, V3 CVII: Symmetric smile and brow raise CN VIII: Normal hearing CN IX,X: Symmetric palate raise  CN XI: 5/5 shoulder shrug CN XII: Symmetric tongue protrusion  No ataxia with finger to nose,  ASSESSMENT/PLAN:   Atherosclerosis of aorta (HCC) Reduced atorvastatin to 10 mg per Neurology. New Rx.   History of TIA (transient ischemic attack) Discussed next steps again--number for [facility] given to patient to schedule. This has worsened her anxiety, discussed, has follow up with  Dr. Shawnee Knapp.   Mood Disorder Discussed, supportive listening. Letter for work given through Thursday, August 19th. She will then go to a modified schedule for September Set goal of not having TV on in room at night   Continue SSRI Discussed side effects of Topamax   HCM Already received COVID vaccine   At follow up repeat BMP   [name], MD  Family Medicine Teaching Service  [facility]

## 2019-12-14 NOTE — Assessment & Plan Note (Signed)
Reduced atorvastatin to 10 mg per Neurology. New Rx.

## 2019-12-17 ENCOUNTER — Other Ambulatory Visit: Payer: Self-pay

## 2019-12-17 ENCOUNTER — Ambulatory Visit (INDEPENDENT_AMBULATORY_CARE_PROVIDER_SITE_OTHER): Payer: 59 | Admitting: Psychology

## 2019-12-17 DIAGNOSIS — F411 Generalized anxiety disorder: Secondary | ICD-10-CM | POA: Diagnosis not present

## 2019-12-17 NOTE — BH Specialist Note (Signed)
  Integrated Behavioral Health Visit via Telemedicine (Telephone)  12/17/2019 LEIA COLETTI [id number]   Session Start time: 10  Session End time: 1020 Total time: 20 minutes  Referring Provider: Dr. Manson Passey Type of Visit: Telephonic Patient location: home [facility] Provider location: [facility] All persons participating in visit: pt and provider  The following statements were read to the patient and/or legal guardian that are established with the [facility] Provider.  "The purpose of this phone visit is to provide behavioral health care while limiting exposure to the coronavirus (COVID19).  There is a possibility of technology failure and discussed alternative modes of communication if that failure occurs."  "By engaging in this telephone visit, you consent to the provision of healthcare.  Additionally, you authorize for your insurance to be billed for the services provided during this telephone visit."   Patient and/or legal guardian consented to telephone visit: Yes   PRESENTING CONCERNS: Patient and/or family reports the following symptoms/concerns: Pt reported she recently had a TIA.  She reported feeling stressed and overwhelmed when this happened.  Pt has already started to engage in coping strategies to manage stress: walking dog, stretching; workout class. Pt stated she is taking a step back from work to recover.    Dr. Shawnee Knapp validated her coping strategies and talked through anxiety stressors.    Duration of problem:past year; Severity of problem:mild  GOALS ADDRESSED: Patient will: 1. Reduce symptoms of: anxiety: worry and stress related to relationship and work 2. Increase knowledge and/or ability of: self-management skills: advocating for needs; engage in coping strategies for stress management  3. Demonstrate ability to: Increase healthy adjustment to current life circumstances  INTERVENTIONS: Interventions utilized:Supportive Counseling and  CBT Standardized Assessments completed:Not Needed  ASSESSMENT: Patient currently experiencinganxiety due to life transitions especially related to relationship with husband  Patient may benefit fromsupportive therapy through marital struggles.  PLAN: 1. Follow up with behavioral health clinician on :advocating for needs in relationship; continue coping strategies  2. Behavioral recommendations:space to relieve anxiety continued. 3. Referral(s):[facility] [employer] (In Clinic)  Royetta Asal, PhD., LMFT-A

## 2019-12-24 ENCOUNTER — Inpatient Hospital Stay: Admission: RE | Admit: 2019-12-24 | Payer: 59 | Source: Ambulatory Visit

## 2019-12-24 ENCOUNTER — Other Ambulatory Visit: Payer: 59

## 2019-12-30 ENCOUNTER — Other Ambulatory Visit: Payer: Self-pay

## 2019-12-30 DIAGNOSIS — I10 Essential (primary) hypertension: Secondary | ICD-10-CM

## 2019-12-30 MED ORDER — LOSARTAN POTASSIUM 100 MG PO TABS: 100.0000 mg | ORAL_TABLET | Freq: Every day | ORAL | 1 refills | Status: DC

## 2020-01-07 ENCOUNTER — Other Ambulatory Visit: Payer: Self-pay | Admitting: *Deleted

## 2020-01-07 DIAGNOSIS — I10 Essential (primary) hypertension: Secondary | ICD-10-CM

## 2020-01-07 MED ORDER — LOSARTAN POTASSIUM 100 MG PO TABS: 100.0000 mg | ORAL_TABLET | Freq: Every day | ORAL | 1 refills | Status: DC

## 2020-01-08 ENCOUNTER — Ambulatory Visit
Admission: RE | Admit: 2020-01-08 | Discharge: 2020-01-08 | Disposition: A | Discharge status: Home / Self Care | Payer: 59 | Source: Ambulatory Visit | Attending: Neurology | Admitting: Neurology

## 2020-01-08 DIAGNOSIS — G459 Transient cerebral ischemic attack, unspecified: Secondary | ICD-10-CM

## 2020-01-08 DIAGNOSIS — I10 Essential (primary) hypertension: Secondary | ICD-10-CM

## 2020-01-08 MED ORDER — IOPAMIDOL (ISOVUE-370) INJECTION 76%
75.0000 mL | Freq: Once | INTRAVENOUS | Status: AC | PRN
  Administered 2020-01-08: 75 mL via INTRAVENOUS

## 2020-01-13 ENCOUNTER — Ambulatory Visit: Payer: 59 | Admitting: Neurology

## 2020-01-14 ENCOUNTER — Ambulatory Visit (INDEPENDENT_AMBULATORY_CARE_PROVIDER_SITE_OTHER): Payer: 59 | Admitting: Psychology

## 2020-01-14 ENCOUNTER — Other Ambulatory Visit: Payer: Self-pay

## 2020-01-14 DIAGNOSIS — F411 Generalized anxiety disorder: Secondary | ICD-10-CM | POA: Diagnosis not present

## 2020-01-14 NOTE — BH Specialist Note (Signed)
Integrated Behavioral Health Visit via Telemedicine (Telephone)  01/14/2020 Veronica [name] [id number]   Session Start time: 10  Session End time: 1020 Total time: 20 minutes  Referring Provider: Dr. Manson Passey Type of Visit: Telephonic Patient location: home [facility] Provider location: [facility] All persons participating in visit: pt and provider  The following statements were read to the patient and/or legal guardian that are established with the [facility] Provider.  "The purpose of this phone visit is to provide behavioral health care while limiting exposure to the coronavirus (COVID19). There is a possibility of technology failure and discussed alternative modes of communication if that failure occurs."  "By engaging in this telephone visit, you consent to the provision of healthcare. Additionally, you authorize for your insurance to be billed for the services provided during this telephone visit."   Patient and/or legal guardian consented to telephone visit: Yes   PRESENTING CONCERNS: Patient and/or family reports the following symptoms/concerns: Pt reported improved anxiety. Pt has returned back to work full time.  Pt stated she has been engaging in using many coping strategies to monitor her anxiety such as stretching and getting massages.   Pt and Dr. Shawnee Knapp discussed spreading out therapy sessions and will f/u in 2 months.   Duration of problem:past year; Severity of problem:mild  GOALS ADDRESSED: Patient will: 1. Reduce symptoms of: anxiety: worry and stress related to relationship and work- have improved  2. Increase knowledge and/or ability of: self-management skills: advocating for needs; engage in coping strategies for stress management  3. Demonstrate ability to: Increase healthy adjustment to current life circumstances  INTERVENTIONS: Interventions utilized:Supportive Counseling and CBT Standardized Assessments completed:Not  Needed  ASSESSMENT: Patient currently experiencinganxiety due to life transitions especially related to relationship with husband  Patient may benefit fromsupportive therapy through marital struggles.  PLAN: 1. Follow up with behavioral health clinician on :2 months  2. Behavioral recommendations:engage in coping strategies 3. Referral(s):[facility] [employer] (In Clinic)  Royetta Asal, PhD., LMFT-A

## 2020-01-24 ENCOUNTER — Other Ambulatory Visit: Payer: Self-pay | Admitting: Neurology

## 2020-01-25 ENCOUNTER — Encounter: Payer: Self-pay | Admitting: Family Medicine

## 2020-01-25 ENCOUNTER — Other Ambulatory Visit: Payer: Self-pay

## 2020-01-25 ENCOUNTER — Ambulatory Visit (INDEPENDENT_AMBULATORY_CARE_PROVIDER_SITE_OTHER): Payer: 59 | Admitting: Family Medicine

## 2020-01-25 VITALS — BP 130/82 | HR 65 | Wt 220.4 lb

## 2020-01-25 DIAGNOSIS — I700 Atherosclerosis of aorta: Secondary | ICD-10-CM

## 2020-01-25 DIAGNOSIS — Z23 Encounter for immunization: Secondary | ICD-10-CM

## 2020-01-25 DIAGNOSIS — I10 Essential (primary) hypertension: Secondary | ICD-10-CM | POA: Diagnosis not present

## 2020-01-25 DIAGNOSIS — Z111 Encounter for screening for respiratory tuberculosis: Secondary | ICD-10-CM

## 2020-01-25 DIAGNOSIS — F39 Unspecified mood [affective] disorder: Secondary | ICD-10-CM

## 2020-01-25 MED ORDER — TOPIRAMATE 25 MG PO TABS: ORAL_TABLET | ORAL | 3 refills | Status: DC

## 2020-01-25 NOTE — Progress Notes (Signed)
    SUBJECTIVE:   CHIEF COMPLAINT / HPI:   Veronica [name] is a very pleasant now 59 year old woman presenting today for follow-up.  Her medical history is significant for generalized anxiety disorder, systemic lupus, cerebrovascular disease with recent TIA and obesity.  She presents today for routine follow-up and to discuss her anxiety as well as a few other concerns.  In regards to the patient's TIA this has not recurred.  She returned to work and is on a different schedule now than she was previously.  She denies recurrence of her symptoms of transient neurologic deficit.  She has been compliant with her statin medication and denies side effects.  She is pleased with the results of her CT scan.  She has not yet had her echocardiogram or cardiac monitor completed.  The patient's anxiety worsened upon her return to work last month.  She reports around Labor Day weekend her son had a DWI.  He called her while she was at work and this was very stressful to her.  She reports ongoing panic-like symptoms at times.  She is able to get this under control with deep breathing exercises and meditation.  She feels therapy is working well for her.  She reports her biggest stressor in her life continues to be her husband.  Due to financial constraints she is unable to exit the relationship until March 2022.  She denies thoughts of hurting herself or others.  She reports she is able to function at work, feels overall she is doing better after her TIA.  The patient is lost 4 pounds since her last visit.  She has been trying to lose weight and cut out all red meats.  She feels well from this.  She does report she tried a probiotic and this resulted in constipation.  She denies melena or hematochezia.  Her last colonoscopy was 2017 with hyperplastic polyp.     PERTINENT  PMH / PSH/Family/Social History : Updated and reviewd   OBJECTIVE:   BP 130/82   Pulse 65   Wt 220 lb 6.4 oz (100 kg)   SpO2 98%   BMI  37.83 kg/m   HEENT: Sclera anicteric. Dentition is moderate. Appears well hydrated. Neck: Supple Cardiac: Regular rate and rhythm. Normal S1/S2. No murmurs, rubs, or gallops appreciated. Lungs: Clear bilaterally to ascultation.  Abdomen: Normoactive bowel sounds. No tenderness to deep or light palpation. No rebound or guarding.  Extremities: Warm, well perfused without edema.  Skin: Warm, dry Psych: Pleasant and appropriate    ASSESSMENT/PLAN:   Atherosclerosis of aorta (HCC) Already on a statin.   Mood disorder (HCC) Most consistent with GAD, discussed, supportive listening provided, stable but not improved. Discussed light therapy, additional medications. Continue SSRI.    Constipation, due to medication, no other changes in bowel habits, likely related to dietary changes, continue to monitor. At follow up, consider early CSY if symptoms change.   Hypertension, at goal, continue current medications. As she started topamax, BMP today.  Chronic tension headaches, refilled topamax, discussed side effects.  HCM Flu vaccine today, already received COVID vaccine  PPD for work placed At follow up-discuss Rheumatology   [name], MD  Family Medicine Teaching Service  [facility]

## 2020-01-25 NOTE — Patient Instructions (Signed)
It was wonderful to see you today.  Please bring ALL of your medications with you to every visit.   Today we talked about:  - Checking your kidney function   - CONGRATULATIONS ON YOUR WEIGHT!!!!  Try prune juice 4 ounces per day for your stomach  Increase leafy greens  Follow up in 3 months for a check up  Call [facility] cardiology [phone]   Thank you for choosing [facility].   Please call [phone] with any questions about today's appointment.  Please be sure to schedule follow up at the front  desk before you leave today.   [name], MD  Family Medicine

## 2020-01-25 NOTE — Assessment & Plan Note (Signed)
Already on a statin.  

## 2020-01-25 NOTE — Assessment & Plan Note (Signed)
Most consistent with GAD, discussed, supportive listening provided, stable but not improved. Discussed light therapy, additional medications. Continue SSRI.

## 2020-01-26 ENCOUNTER — Telehealth: Payer: Self-pay | Admitting: Family Medicine

## 2020-01-26 LAB — BASIC METABOLIC PANEL
BUN/Creatinine Ratio: 13 (ref 9–23)
BUN: 12 mg/dL (ref 6–24)
CO2: 25 mmol/L (ref 20–29)
Calcium: 9.9 mg/dL (ref 8.7–10.2)
Chloride: 100 mmol/L (ref 96–106)
Creatinine, Ser: 0.92 mg/dL (ref 0.57–1.00)
GFR calc Af Amer: 80 mL/min/{1.73_m2} (ref >59)
GFR calc non Af Amer: 69 mL/min/{1.73_m2} (ref >59)
Glucose: 99 mg/dL (ref 65–99)
Potassium: 3.5 mmol/L (ref 3.5–5.2)
Sodium: 137 mmol/L (ref 134–144)

## 2020-01-26 NOTE — Telephone Encounter (Signed)
Attempted to call patient with results--- left generic voicemail to call back.   If calls back, renal function is perfect on new medication. Let me know if she has questions. Will send letter.

## 2020-01-27 ENCOUNTER — Ambulatory Visit (INDEPENDENT_AMBULATORY_CARE_PROVIDER_SITE_OTHER): Payer: 59

## 2020-01-27 ENCOUNTER — Other Ambulatory Visit: Payer: Self-pay

## 2020-01-27 ENCOUNTER — Encounter: Payer: Self-pay | Admitting: Family Medicine

## 2020-01-27 DIAGNOSIS — Z111 Encounter for screening for respiratory tuberculosis: Secondary | ICD-10-CM

## 2020-01-27 LAB — TB SKIN TEST
Induration: 0 mm
TB Skin Test: NEGATIVE

## 2020-01-27 NOTE — Progress Notes (Signed)
Patient is here for a PPD read.  It was placed on 01/25/2020 in the right forearm @ 1157 am.  *Per Dr. Leveda Anna, okay to read at 11:05 am.   PPD RESULTS:  Result: negative Induration: 0 mm  Letter created and given to patient for documentation purposes. Veronda Prude, RN

## 2020-04-15 ENCOUNTER — Ambulatory Visit (INDEPENDENT_AMBULATORY_CARE_PROVIDER_SITE_OTHER): Payer: 59 | Admitting: Family Medicine

## 2020-04-15 ENCOUNTER — Encounter: Payer: Self-pay | Admitting: Family Medicine

## 2020-04-15 ENCOUNTER — Other Ambulatory Visit: Payer: Self-pay

## 2020-04-15 VITALS — BP 142/80 | HR 64 | Ht 64.0 in | Wt 224.8 lb

## 2020-04-15 DIAGNOSIS — F419 Anxiety disorder, unspecified: Secondary | ICD-10-CM

## 2020-04-15 DIAGNOSIS — I10 Essential (primary) hypertension: Secondary | ICD-10-CM

## 2020-04-15 DIAGNOSIS — F39 Unspecified mood [affective] disorder: Secondary | ICD-10-CM

## 2020-04-15 DIAGNOSIS — K219 Gastro-esophageal reflux disease without esophagitis: Secondary | ICD-10-CM

## 2020-04-15 DIAGNOSIS — M329 Systemic lupus erythematosus, unspecified: Secondary | ICD-10-CM

## 2020-04-15 DIAGNOSIS — Z8673 Personal history of transient ischemic attack (TIA), and cerebral infarction without residual deficits: Secondary | ICD-10-CM

## 2020-04-15 DIAGNOSIS — R058 Other specified cough: Secondary | ICD-10-CM

## 2020-04-15 DIAGNOSIS — R7303 Prediabetes: Secondary | ICD-10-CM

## 2020-04-15 DIAGNOSIS — R1319 Other dysphagia: Secondary | ICD-10-CM

## 2020-04-15 MED ORDER — SPIRONOLACTONE 25 MG PO TABS: 25.0000 mg | ORAL_TABLET | Freq: Every day | ORAL | 2 refills | Status: DC

## 2020-04-15 MED ORDER — FLUOXETINE HCL 20 MG PO CAPS: 20.0000 mg | ORAL_CAPSULE | Freq: Every day | ORAL | 2 refills | Status: DC

## 2020-04-15 MED ORDER — CETIRIZINE HCL 10 MG PO TABS: 10.0000 mg | ORAL_TABLET | Freq: Every day | ORAL | 3 refills | Status: DC | PRN

## 2020-04-15 MED ORDER — BUSPIRONE HCL 5 MG PO TABS: 5.0000 mg | ORAL_TABLET | Freq: Two times a day (BID) | ORAL | 2 refills | Status: DC

## 2020-04-15 MED ORDER — OMEPRAZOLE 20 MG PO CPDR: 20.0000 mg | DELAYED_RELEASE_CAPSULE | Freq: Every day | ORAL | 3 refills | Status: DC

## 2020-04-15 NOTE — Assessment & Plan Note (Signed)
Discussed snacking strategies and limiting sugary beverages.

## 2020-04-15 NOTE — Progress Notes (Signed)
SUBJECTIVE:   CHIEF COMPLAINT / HPI:   Veronica [name] is a pleasant 59 year old woman with history significant for obesity, hypertension, anxiety and recent TIA presenting for routine follow-up.  She has a number of concerns today.  We agreed to address her anxiety, difficulty swallowing and orthostasis at work.  Mood The patient reports her anxiety and depression are doing okay.  PHQ was reviewed.  She reports her main concern is her anxiety.  She feels anxious in social situations.  She has anxiety work.  She reports her sleep continues to be poor.  Her work schedule is variable.  She works at a group home.  She has a sleeping situation at work  that is not quiet nor dark.  Her relationship with her husband continues to be tense.  She reports that either she or he will move out in March 2022.  Regards to the relationship with her husband, he is not working.  She reports he is becoming a member of the church tomorrow.  She is looking forward to this.  She reports she is given him an Chief Operating Officer.  She reports that he frequently wants to have intercourse with her and she has no interest in having intercourse with her.  When she does have intercourse with him she oftentimes has pain.  This is both with deep and light penetration.  She wonders if her pain with intercourse could be related to the relationship or if it is normal to have pain with intercourse.  Hypertension the patient reports compliance with her medications.  She does report twice at work she felt dizzy upon immediately standing.  She denies chest pain dyspnea on exertion, lower extremity edema.  Dysphagia This is a new problem.  She reports over the past few years this is been on and off.  Over the past few months this is worsened.  She is noticing this more.  This only occurs with meats and solid foods.  She is noted with most dry solid foods.  She feels as though food gets stuck.  She has no pain with swallowing.  She denies  difficulty swallowing with liquids, nausea, vomiting, melena or hematochezia.  She follows with by her GI.  She reports a history of acid reflux.  She describes her symptoms as burning in her throat.  She sometimes has upper abdominal burning as well.  This is intermittent.  She previously used an H2 blocker over-the-counter for this but has not been using this recently.  Headaches the patient was placed on Topamax by neurology.  She reports her headache number per day has decreased although she continues to have near daily headaches.  She is taking Tylenol PM at the recommendation of her rheumatologist at night for joint aches.  She does report some word finding difficulties at times which she is unsure if is related to the Topamax.  This is only been going on for the past month or so.  Denies difficulty speaking, slurred speech, weakness.  She is taking aspirin for TIA  PERTINENT  PMH / PSH/Family/Social History : Reviewed and updated   OBJECTIVE:   BP (!) 142/80   Pulse 64   Ht 5\' 4"  (1.626 m)   Wt 224 lb 12.8 oz (102 kg)   SpO2 99%   BMI 38.59 kg/m   Today's weight:  Last Weight  Most recent update: 04/15/2020 10:31 AM   Weight  102 kg (224 lb 12.8 oz)  Review of prior weights: Filed Weights   04/15/20 1030  Weight: 224 lb 12.8 oz (102 kg)    Cardiac: Regular rate and rhythm. Normal S1/S2. No murmurs, rubs, or gallops appreciated. Lungs: Clear bilaterally to ascultation.  Abdomen: Normoactive bowel sounds. No tenderness to deep or light palpation. No rebound or guarding. No epigastric tenderns. Neck no thyromegaly, no LAD  Psych: Pleasant and appropriate     ASSESSMENT/PLAN:   Mood disorder (HCC) Mix of anxiety and depressive symptoms.  In part this is also situational.  We discussed options at length. SSRI refilled, no side effects. Discussed options, recommended continued therapy, Buspar added.   History of TIA (transient ischemic attack) No further episodes,  the patient has follow-up with neurology.  She has not yet heard from Cardiology regarding monitoring, will reach out.     Essential hypertension, benign At goal, encouraged H2O rather than teas/sodas. Will continue current medications. Orthosta  Esophageal dysphagia Differential includes stricture, mass, dysmotility, achalasia given SLE. Most likely related to reflux, given age and dysphagia referral to GI. Restarted PPI interim given reflux symptoms Reviewed return precautions CBC today to evaluate for anemia related to possible gastritis given recent aspirin addition   Prediabetes Discussed snacking strategies and limiting sugary beverages.    History of Diverticulitis patient reports she avoids all nuts and seeds. Discussed recommendations for this. Will send via Mychart--encouraged high fiber diet including with seeds   Dyspareunia- discussed safety in relationship, basis for healthy sexual relationship  Orthostasis, no syncope or neurologic symptoms with this, orthostatics negative in office, limit caffeine, encourage water   Headaches, tension type, could consider alternative to topamax as I suspect she may be having some slowed thinking   HCM Already had COVID vaccine and booster    [name], MD  Family Medicine Teaching Service  [facility]

## 2020-04-15 NOTE — Patient Instructions (Addendum)
It was wonderful to see you today.  Please bring ALL of your medications with you to every visit.   Today we talked about:   Snacking - At least 4 big water bottles per day - "Two component snacks" - Such as: yogurt plus apples, carrots and peanut butter, celery and hummus, cheese and apples, oranges + cheese    Anxiety  - Buspar 5 mg twice a day--We can increase this to three times per day if needed  Reflux and swallowing  - you should be called by Gastroenterology for this concern  - Start omeprazole daily for symptoms  Please call if you have worsening with swallowing  Follow up in 2-3 months--after your 30 day work month!  You will be called by our office   Thank you for choosing [facility].   Please call [phone] with any questions about today's appointment.  Please be sure to schedule follow up at the front  desk before you leave today.   [name], MD  Family Medicine

## 2020-04-15 NOTE — Assessment & Plan Note (Signed)
Differential includes stricture, mass, dysmotility, achalasia given SLE. Most likely related to reflux, given age and dysphagia referral to GI. Restarted PPI interim given reflux symptoms Reviewed return precautions CBC today to evaluate for anemia related to possible gastritis given recent aspirin addition

## 2020-04-15 NOTE — Assessment & Plan Note (Signed)
No further episodes, the patient has follow-up with neurology.  She has not yet heard from Cardiology regarding monitoring, will reach out.

## 2020-04-15 NOTE — Assessment & Plan Note (Signed)
At goal, encouraged H2O rather than teas/sodas. Will continue current medications. Veronica [name]

## 2020-04-15 NOTE — Assessment & Plan Note (Signed)
Mix of anxiety and depressive symptoms.  In part this is also situational.  We discussed options at length. SSRI refilled, no side effects. Discussed options, recommended continued therapy, Buspar added.

## 2020-04-16 LAB — CBC
Hematocrit: 38.2 % (ref 34.0–46.6)
Hemoglobin: 12.8 g/dL (ref 11.1–15.9)
MCH: 28.7 pg (ref 26.6–33.0)
MCHC: 33.5 g/dL (ref 31.5–35.7)
MCV: 86 fL (ref 79–97)
Platelets: 337 10*3/uL (ref 150–450)
RBC: 4.46 x10E6/uL (ref 3.77–5.28)
RDW: 11.6 % — ABNORMAL LOW (ref 11.7–15.4)
WBC: 4.8 10*3/uL (ref 3.4–10.8)

## 2020-04-16 LAB — BASIC METABOLIC PANEL
BUN/Creatinine Ratio: 14 (ref 9–23)
BUN: 14 mg/dL (ref 6–24)
CO2: 22 mmol/L (ref 20–29)
Calcium: 9.8 mg/dL (ref 8.7–10.2)
Chloride: 100 mmol/L (ref 96–106)
Creatinine, Ser: 0.98 mg/dL (ref 0.57–1.00)
GFR calc Af Amer: 74 mL/min/{1.73_m2} (ref >59)
GFR calc non Af Amer: 64 mL/min/{1.73_m2} (ref >59)
Glucose: 95 mg/dL (ref 65–99)
Potassium: 3.8 mmol/L (ref 3.5–5.2)
Sodium: 137 mmol/L (ref 134–144)

## 2020-04-17 ENCOUNTER — Telehealth: Payer: Self-pay | Admitting: Family Medicine

## 2020-04-17 NOTE — Telephone Encounter (Signed)
Called with results.   Virgil Slinger, MD  Family Medicine Teaching Service   

## 2020-04-25 NOTE — Progress Notes (Signed)
NEUROLOGY FOLLOW UP OFFICE NOTE  [name] [id number]   Subjective:  Veronica [name] is a 17 year oldright-handed woman with SLE, hypertension, and allergic rhinitis who follows up for TIA and headache.  UPDATE: Current medications for secondary stroke prevention:  ASA 81mg , atorvastatin 10mg , losartan  Underwent stroke workup: 01/08/2020 CTA head and neck:  No large vessel stenosis or occlusion 2D echocardiogram:  Not performed.  She never received a call to schedule.   Cardiac event monitor:  Not performed.  She never received a call to schedule  Headaches are now improved with topiramate.  They are infrequent.  About 3 weeks ago, she has had episodes of "black outs" in which she is standing and suddenly feels off balance, possibly loss of awareness for a second or so.  She also has been stuttering or have slurred speech.  Orthostatic vitals reportedly normal.  She was told that it could have been dehydration so she started increasing water intake and it has resolved.   Current NSAIDS:Naproxen 500mg  Current analgesics:Tylenol Current triptans:none Current ergotamine:none Current anti-emetic:none Current muscle relaxants:Flexeril Current anti-anxiolytic:none Current sleep aide:none Current Antihypertensive medications:Losartan, indapamide, spironolactone Current Antidepressant medications:none Current Anticonvulsant medications:topiramate 50mg  at bedtime Current anti-CGRP:none Current Vitamins/Herbal/Supplements:none Current Antihistamines/Decongestants:Zyrtec Other therapy:Vicks vapor rub to forehead Other medication:Plaquenil  Caffeine:No coffee. Sometimes Coke Diet:Drinks 3 bottles of 16.9 oz water daily. 1 can of Fanta Orange or Exercise:no Depression:no; Anxiety:Increased work-related anxiety Other pain:no Sleep hygiene:Poor. Diagnosed with mild OSA in 2019. Instructed to lose weight and follow  up. She reports having lost weight but sleep has not improved.    HISTORY: She was seen in the ED at [facility] on 12/05/2019 for left sided facial droop and slurred speech.  She was on the phone when she started feeling lightheaded and noted slurred speech.  She called 911 and when EMS arrived, they noticed subtle left sided facial droop.  She had an associated severe pounding frontal headache.  No unilateral numbness or weakness of extremities. She presented as a code stroke.  Blood pressure initially 130 systolic but later 170.  No objective neurologic signs on exam in the ED (NIHSS 0).  CT and MRI of brain personally reviewed were unremarkable.  tPA was not administered due to resolution of symptoms.  She was discharged on ASA 81mg  daily.  Since then, she has had a daily headache.  She also has feeling of heat in her body and has increased nervousness and shakiness.  She feels off balance.  When she closes her eyes, she sees darkness that is moving quickly across her visual field.  Due to increased anxiety, she was started on Prozac.  Lipitor was increased from 10mg  to 40mg  daily.  She has history of headaches since 2012-2013, described as moderate bifrontal non-throbbing headache with slight dizziness.  She has a known partially empty sella on MRI of brain with and without contrast from 04/08/15 and MRI of brain without contrast on 07/12/11.She has her eyes examined annually. No significant issues.   Labs from July:  Hgb A1c 5.5; LDL 60 Labs from August:  SARS Coronvirus 2 negative; TSH 1.150  Past NSAIDS:ibuprofen Past analgesics:Excedrin Past abortive triptans:none Past abortive ergotamine:none Past muscle relaxants:none Past anti-emetic:none Past antihypertensive medications:none Past antidepressant medications:none Past anticonvulsant medications:none Past anti-CGRP:none Past vitamins/Herbal/Supplements:none Past  antihistamines/decongestants:none Other past therapies:none   Sister has MS.  PAST MEDICAL HISTORY: Past Medical History:  Diagnosis Date  . Daily headache   . Fibromyalgia   . G6PD deficiency  outside labs--low level, no longer on HCQ  . Hypertension   . Prediabetes   . Rheumatoid arthritis (HCC)   . SLE (systemic lupus erythematosus) (HCC) 1998    MEDICATIONS: Current Outpatient Medications on File Prior to Visit  Medication Sig Dispense Refill  . aspirin EC 81 MG tablet Take 1 tablet (81 mg total) by mouth daily. Swallow whole. 30 tablet 11  . atorvastatin (LIPITOR) 10 MG tablet Take 1 tablet (10 mg total) by mouth daily. 90 tablet 3  . busPIRone (BUSPAR) 5 MG tablet Take 1 tablet (5 mg total) by mouth 2 (two) times daily. 60 tablet 2  . cetirizine (ZYRTEC) 10 MG tablet Take 1 tablet (10 mg total) by mouth daily as needed for allergies. 90 tablet 3  . FLUoxetine (PROZAC) 20 MG capsule Take 1 capsule (20 mg total) by mouth daily. 90 capsule 2  . hydroxychloroquine (PLAQUENIL) 200 MG tablet Take 400 mg by mouth at bedtime.     . indapamide (LOZOL) 2.5 MG tablet Take 1 tablet (2.5 mg total) by mouth daily. 90 tablet 2  . losartan (COZAAR) 100 MG tablet Take 1 tablet (100 mg total) by mouth daily. 90 tablet 1  . omeprazole (PRILOSEC) 20 MG capsule Take 1 capsule (20 mg total) by mouth daily. 30 capsule 3  . spironolactone (ALDACTONE) 25 MG tablet Take 1 tablet (25 mg total) by mouth daily. 90 tablet 2  . topiramate (TOPAMAX) 25 MG tablet TAKE 1 TABLET BY MOUTH AT BEDTIME FOR  1  WEEK,  THEN  INCREASE  TO  2  TABLETS  AT  BEDTIME 60 tablet 3   No current facility-administered medications on file prior to visit.    ALLERGIES: Allergies  Allergen Reactions  . Amlodipine Swelling    Leg edema  . Tessalon [Benzonatate] Other (See Comments)    Lip swelling    FAMILY HISTORY: Family History  Problem Relation Age of Onset  . Diabetes Mother   . Hypertension Father   .  Multiple sclerosis Sister   . Hypertension Brother     SOCIAL HISTORY: Social History   Socioeconomic History  . Marital status: Married    Spouse name: Cedric  . Number of children: 2  . Years of education: Not on file  . Highest education level: Some college, no degree  Occupational History    Employer: [employer]  Tobacco Use  . Smoking status: Never Smoker  . Smokeless tobacco: Never Used  Vaping Use  . Vaping Use: Never used  Substance and Sexual Activity  . Alcohol use: No  . Drug use: No  . Sexual activity: Yes    Partners: Male    Birth control/protection: Surgical  Other Topics Concern  . Not on file  Social History Narrative   Pt lives with spouse. Some college.    Works 7 days on then 7 days off at place of work       Patient is right-handed. She lives with her husband in a one level home. She occasionally drinks soda. She does not exercise.    Social Determinants of Health   Financial Resource Strain: Not on file  Food Insecurity: No Food Insecurity  . Worried About Programme researcher, broadcasting/film/video in the Last Year: Never true  . Ran Out of Food in the Last Year: Never true  Transportation Needs: No Transportation Needs  . Lack of Transportation (Medical): No  . Lack of Transportation (Non-Medical): No  Physical Activity: Not on file  Stress: Not on file  Social Connections: Not on file  Intimate Partner Violence: Not on file     Objective:  Blood pressure 114/76, pulse 65, height 5\' 4"  (1.626 m), weight 228 lb 3.2 oz (103.5 kg), SpO2 98 %. General: No acute distress.  Patient appears well-groomed.   Head:  Normocephalic/atraumatic Eyes:  Fundi examined but not visualized Neck: supple, no paraspinal tenderness, full range of motion Heart:  Regular rate and rhythm Lungs:  Clear to auscultation bilaterally Back: No paraspinal tenderness Neurological Exam: alert and oriented to person, place, and time. Attention span and concentration intact, recent and remote  memory intact, fund of knowledge intact.  Speech fluent and not dysarthric, language intact.  CN II-XII intact. Bulk and tone normal, muscle strength 5/5 throughout.  Sensation to light touch, temperature and vibration intact.  Deep tendon reflexes 2+ throughout, toes downgoing.  Finger to nose and heel to shin testing intact.  Gait normal, Romberg negative.   Assessment/Plan:   1.  Transient ischemic attack 2.  tension-type headache, not intractable, improved 3.  Slurred speech and "black out" spells, resolved.  Unclear if may have been due to dehydration.  Does not sound like seizure or TIA 4.  Hypertension  1.  Topiramate 2.  Secondary stroke prevention:  ASA, statin, blood pressure management 3. Reorder 2D echocardiogram and cardiac event monitor. If she does not receive a call to schedule within a week, she is to contact .  4. Follow up 6 months.  [place], DO  CC: Shon Millet, MD

## 2020-04-27 ENCOUNTER — Other Ambulatory Visit: Payer: Self-pay

## 2020-04-27 ENCOUNTER — Ambulatory Visit: Payer: 59 | Admitting: Neurology

## 2020-04-27 ENCOUNTER — Encounter: Payer: Self-pay | Admitting: Neurology

## 2020-04-27 VITALS — BP 114/76 | HR 65 | Ht 64.0 in | Wt 228.2 lb

## 2020-04-27 DIAGNOSIS — G459 Transient cerebral ischemic attack, unspecified: Secondary | ICD-10-CM | POA: Diagnosis not present

## 2020-04-27 DIAGNOSIS — G44219 Episodic tension-type headache, not intractable: Secondary | ICD-10-CM | POA: Diagnosis not present

## 2020-04-27 DIAGNOSIS — I10 Essential (primary) hypertension: Secondary | ICD-10-CM

## 2020-04-27 NOTE — Patient Instructions (Signed)
1.  Continue aspirin, statin, blood pressure mediation 2.  Continue topiramate 3.  Follow up 6 months

## 2020-05-09 ENCOUNTER — Other Ambulatory Visit: Payer: 59

## 2020-05-18 ENCOUNTER — Other Ambulatory Visit: Payer: Self-pay | Admitting: Neurology

## 2020-05-18 DIAGNOSIS — I10 Essential (primary) hypertension: Secondary | ICD-10-CM

## 2020-05-18 DIAGNOSIS — G459 Transient cerebral ischemic attack, unspecified: Secondary | ICD-10-CM

## 2020-05-24 ENCOUNTER — Other Ambulatory Visit: Payer: Self-pay

## 2020-05-24 ENCOUNTER — Ambulatory Visit: Payer: 59

## 2020-05-24 ENCOUNTER — Inpatient Hospital Stay: Payer: 59

## 2020-05-24 DIAGNOSIS — I10 Essential (primary) hypertension: Secondary | ICD-10-CM

## 2020-05-24 DIAGNOSIS — G459 Transient cerebral ischemic attack, unspecified: Secondary | ICD-10-CM

## 2020-05-27 NOTE — Progress Notes (Signed)
Tried calling pt no answer. LMOVm to call the office back.

## 2020-06-28 ENCOUNTER — Other Ambulatory Visit: Payer: Self-pay

## 2020-06-28 DIAGNOSIS — I10 Essential (primary) hypertension: Secondary | ICD-10-CM

## 2020-06-28 MED ORDER — LOSARTAN POTASSIUM 100 MG PO TABS: 100.0000 mg | ORAL_TABLET | Freq: Every day | ORAL | 1 refills | Status: DC

## 2020-07-15 ENCOUNTER — Other Ambulatory Visit: Payer: Self-pay

## 2020-07-15 ENCOUNTER — Ambulatory Visit: Payer: Medicaid Other | Admitting: Family Medicine

## 2020-07-15 DIAGNOSIS — I10 Essential (primary) hypertension: Secondary | ICD-10-CM

## 2020-07-15 MED ORDER — INDAPAMIDE 2.5 MG PO TABS: 2.5000 mg | ORAL_TABLET | Freq: Every day | ORAL | 2 refills | Status: DC

## 2020-07-22 ENCOUNTER — Other Ambulatory Visit: Payer: Self-pay | Admitting: Neurology

## 2020-08-01 ENCOUNTER — Encounter: Payer: Self-pay | Admitting: Family Medicine

## 2020-08-01 ENCOUNTER — Ambulatory Visit (INDEPENDENT_AMBULATORY_CARE_PROVIDER_SITE_OTHER): Payer: 59 | Admitting: Family Medicine

## 2020-08-01 ENCOUNTER — Other Ambulatory Visit: Payer: Self-pay

## 2020-08-01 VITALS — BP 108/60 | HR 62 | Wt 224.0 lb

## 2020-08-01 DIAGNOSIS — R7303 Prediabetes: Secondary | ICD-10-CM | POA: Diagnosis not present

## 2020-08-01 DIAGNOSIS — K921 Melena: Secondary | ICD-10-CM | POA: Diagnosis not present

## 2020-08-01 DIAGNOSIS — R1319 Other dysphagia: Secondary | ICD-10-CM

## 2020-08-01 DIAGNOSIS — F39 Unspecified mood [affective] disorder: Secondary | ICD-10-CM

## 2020-08-01 DIAGNOSIS — R4689 Other symptoms and signs involving appearance and behavior: Secondary | ICD-10-CM | POA: Diagnosis not present

## 2020-08-01 NOTE — Progress Notes (Signed)
SUBJECTIVE:   CHIEF COMPLAINT / HPI:   Veronica [name] is a very pleasant 60 year old woman with history significant for major depression, hypertension, dyslipidemia and chronic tension type headaches presenting today for routine follow-up.  The patient reports she had another spell as she had last August but this was different.  She had no change in speech no notable facial droop.  She reports she just felt several hours of vague symptoms of feeling unwell.  This happened last Friday she has had none since that time.  She specifically denies headache at the time, weakness, numbness, slurred speech, facial droop,, difficulty swallowing, difficulty speaking or other symptoms.  She just reports a several hour episode of feeling not like herself.  She has not had 1 since that time.  She had a normal cardiac event monitor normal evaluation after her abnormal episode in August, this was ultimately thought to be due to dehydration or hypoglycemia.  She reports at the time she is extended event and trying to order dinner with her friend.  She reports has been under considerable stress for the last few months.  She is had 4 days off per month since January.  She spends each night at the facility where she gets little sleep as the facility is loud.  She reports a poor diet she reports she is feeling somewhat overwhelmed.  She is interested in finding a counselor and is interested in looking on psychology today for this.  The patient reports an episode of hematochezia several months ago.  She also endorses worsening dysphagia.  She is previously referred to gastroenterology for this but has not had time to go.  She has not had any hematochezia for approximately 1 month.  She does endorse fatigue but denies palpitations, chest pain or dyspnea on exertion.  The patient reports her mood is okay.  Her sleep is poor due to her job.  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  She denies  thoughts of hurting herself or others.  She is still with her husband who has started a new job again today.    PERTINENT  PMH / PSH/Family/Social History : Also history notable for SLE   OBJECTIVE:   BP 108/60   Pulse 62   Wt 224 lb (101.6 kg)   SpO2 98%   BMI 38.45 kg/m   Today's weight:  Last Weight  Most recent update: 08/01/2020  2:21 PM   Weight  101.6 kg (224 lb)           Review of prior weights: [employer]   08/01/20 1421  Weight: 224 lb (101.6 kg)   Well dressed, appropriate  Cardiac: Regular rate and rhythm. Normal S1/S2. No murmurs, rubs, or gallops appreciated. Lungs: Clear bilaterally to ascultation.  Abdomen: Normoactive bowel sounds. No tenderness to deep or light palpation. No rebound or guarding.   Psych: Pleasant, somewhat flat affect     ASSESSMENT/PLAN:   Spell of behavior change Prior evaluation was negative and these were thought to be due to possibly dehydration or hypoglycemia.  I suspect this may again have been due to dehydration given the nature of her job currently in significant distress.  Strict return precautions given will check blood count, BMP and thyroid today. Other etiology to consider would be dysautonomia, much less likely, also less likely lupus neuritis   Mood disorder (HCC) Discussed options.  This is not well controlled currently due to significant stressors of  job.  Recommend consideration of increasing SSRI in future.  She is taking BuSpar just once a day.  Information given for psychology today.  Supportive listening provided.  Prediabetes A1c today.  Esophageal dysphagia Dysphagia is currently mostly to solid foods.  She has no weight loss but she does have some acid reflux symptoms.  Given concerns will refer to gastroenterology for consideration of an EGD. Continue PPI at this time.    Hematochezia- no history of hemorrhoids or fissures. Referral to Gastroenterology--last CSY 2017, recommended repeat in 5-10 years, now  is 5 years. Given new symptoms, consideration of repeat. CBC today.  The patient endorses what she calls left-sided pectoralis pain.  This worsens when she moves her arm and is underneath her bra she thinks.  Given the need for careful examination of this area recommended close follow-up which was scheduled for this.  She denies substernal chest pressure or pain with exertion. This is reproducible with palpation, located at insertion of L pectoralis. Strict ED precautions discussed.   HCM Had COVID vaccines at work.     [name], MD  Family Medicine Teaching Service  [facility]

## 2020-08-01 NOTE — Assessment & Plan Note (Signed)
Discussed options.  This is not well controlled currently due to significant stressors of job.  Recommend consideration of increasing SSRI in future.  She is taking BuSpar just once a day.  Information given for psychology today.  Supportive listening provided.

## 2020-08-01 NOTE — Assessment & Plan Note (Signed)
Dysphagia is currently mostly to solid foods.  She has no weight loss but she does have some acid reflux symptoms.  Given concerns will refer to gastroenterology for consideration of an EGD. Continue PPI at this time.

## 2020-08-01 NOTE — Assessment & Plan Note (Signed)
A1c today.  

## 2020-08-01 NOTE — Patient Instructions (Addendum)
It was wonderful to see you today.  Please bring ALL of your medications with you to every visit.   Today we talked about:  --Getting some blood work   -- I will call with results  -- You will be called by Gastroenterology   -- Go to [email or web address] to find a counselor   --go to the ER if you have weakness, numbness, difficulty speaking, or chest pain --> Emergency Room   Thank you for choosing [facility].   Please call [phone] with any questions about today's appointment.  Please be sure to schedule follow up at the front  desk before you leave today.   [name], MD  Family Medicine

## 2020-08-01 NOTE — Assessment & Plan Note (Signed)
Prior evaluation was negative and these were thought to be due to possibly dehydration or hypoglycemia.  I suspect this may again have been due to dehydration given the nature of her job currently in significant distress.  Strict return precautions given will check blood count, BMP and thyroid today.

## 2020-08-02 ENCOUNTER — Encounter: Payer: Self-pay | Admitting: Family Medicine

## 2020-08-02 ENCOUNTER — Telehealth: Payer: Self-pay | Admitting: Family Medicine

## 2020-08-02 LAB — BASIC METABOLIC PANEL
BUN/Creatinine Ratio: 13 (ref 9–23)
BUN: 13 mg/dL (ref 6–24)
CO2: 24 mmol/L (ref 20–29)
Calcium: 9.8 mg/dL (ref 8.7–10.2)
Chloride: 101 mmol/L (ref 96–106)
Creatinine, Ser: 0.98 mg/dL (ref 0.57–1.00)
Glucose: 91 mg/dL (ref 65–99)
Potassium: 3.6 mmol/L (ref 3.5–5.2)
Sodium: 137 mmol/L (ref 134–144)
eGFR: 67 mL/min/{1.73_m2} (ref >59)

## 2020-08-02 LAB — CBC
Hematocrit: 37.5 % (ref 34.0–46.6)
Hemoglobin: 12.4 g/dL (ref 11.1–15.9)
MCH: 27.7 pg (ref 26.6–33.0)
MCHC: 33.1 g/dL (ref 31.5–35.7)
MCV: 84 fL (ref 79–97)
Platelets: 339 10*3/uL (ref 150–450)
RBC: 4.47 x10E6/uL (ref 3.77–5.28)
RDW: 11.8 % (ref 11.7–15.4)
WBC: 5.3 10*3/uL (ref 3.4–10.8)

## 2020-08-02 LAB — HEMOGLOBIN A1C
Est. average glucose Bld gHb Est-mCnc: 120 mg/dL
Hgb A1c MFr Bld: 5.8 % — ABNORMAL HIGH (ref 4.8–5.6)

## 2020-08-02 LAB — TSH: TSH: 1.08 u[IU]/mL (ref 0.450–4.500)

## 2020-08-02 NOTE — Telephone Encounter (Signed)
Attempted to call X1. Will try again 4/6.   [name], MD  Family Medicine Teaching Service

## 2020-08-19 ENCOUNTER — Other Ambulatory Visit: Payer: Self-pay

## 2020-08-19 ENCOUNTER — Encounter: Payer: Self-pay | Admitting: Family Medicine

## 2020-08-19 ENCOUNTER — Ambulatory Visit (INDEPENDENT_AMBULATORY_CARE_PROVIDER_SITE_OTHER): Payer: 59 | Admitting: Family Medicine

## 2020-08-19 VITALS — BP 132/65 | HR 67 | Ht 64.0 in | Wt 227.8 lb

## 2020-08-19 DIAGNOSIS — G8929 Other chronic pain: Secondary | ICD-10-CM

## 2020-08-19 DIAGNOSIS — R109 Unspecified abdominal pain: Secondary | ICD-10-CM

## 2020-08-19 DIAGNOSIS — R079 Chest pain, unspecified: Secondary | ICD-10-CM

## 2020-08-19 DIAGNOSIS — J301 Allergic rhinitis due to pollen: Secondary | ICD-10-CM | POA: Diagnosis not present

## 2020-08-19 DIAGNOSIS — M25571 Pain in right ankle and joints of right foot: Secondary | ICD-10-CM

## 2020-08-19 DIAGNOSIS — N644 Mastodynia: Secondary | ICD-10-CM

## 2020-08-19 DIAGNOSIS — I10 Essential (primary) hypertension: Secondary | ICD-10-CM

## 2020-08-19 MED ORDER — FLUTICASONE PROPIONATE 50 MCG/ACT NA SUSP: 2.0000 | Freq: Every day | NASAL | 6 refills | Status: DC

## 2020-08-19 NOTE — Assessment & Plan Note (Signed)
Recently had a bout of diverticulitis requiring antibiotics.  Discussed increasing fiber in her diet to reduce attacks.

## 2020-08-19 NOTE — Progress Notes (Signed)
SUBJECTIVE:   CHIEF COMPLAINT / HPI:   Veronica [name] is a very pleasant 60 year old woman with history significant for systemic lupus, depression, hypertension and prediabetes presenting today for routine follow-up.   The patient today reports left breast pain.  At her last visit she had talked some about chest pain we discussed strict report return precautions and close follow-up.  Today she reports the pain is more in her breast actually.  She reports this is worse when she leans forward and she feels the weight of her breast on her left shoulder.  She denies palpitations dyspnea on exertion, chest pain, central chest pain, syncope or similar episodes as to prior.  She reports she can reproduce the pain by pressing over her muscles some.  She reports she has had this pain before when her bras are not properly fitting.  The pain is not worse when she lies down rather it is worse when she leans forward at times.  There is not an exertional component to it unless she feels the weight on her left anterior shoulder muscles.  She denies new breast masses or discharge.  The patient reports her mood is doing okay.  The patient reports that her right leg is bothering her.  Several times a day most days she will have sharp radiating pain going down her right leg.  This starts at the lateral hip and goes to the foot.  She denies back pain, hip pain, injury, fall, numbness, tingling there is no associated neuropathic type pain.  She has not tried anything for this.  The patient also reports ongoing right ankle pain and problems with this.  This is not as much pain as it is swelling at times.  The ankle swells and then goes down.  No significant redness with this but she does notice that the swelling comes and goes.  She denies falls or injuries.  No history of prior sprains.  No issue with the left ankle or other small joints.  She does seen rheumatology in the coming months. PERTINENT  PMH /  PSH/Family/Social History : updated and reviewed   OBJECTIVE:   BP 132/65   Pulse 67   Ht 5\' 4"  (1.626 m)   Wt 227 lb 12.8 oz (103.3 kg)   SpO2 98%   BMI 39.10 kg/m   Today's weight:  Last Weight  Most recent update: 08/19/2020 10:45 AM   Weight  103.3 kg (227 lb 12.8 oz)           Review of prior weights: Filed Weights   08/19/20 1043  Weight: 227 lb 12.8 oz (103.3 kg)     Cardiac: Regular rate and rhythm. Normal S1/S2. No murmurs, rubs, or gallops appreciated. Lungs: Clear bilaterally to ascultation.  Chaperoned breast exam, bilateral breasts examined symmetric and normal-appearing There is a small superficial cyst that appears to be a sebaceous cyst over the right lateral aspect of the breast along the bra line  The left breast is normal there is no palpable abnormality.  She does have reproducible pain along the left pectoralis insertion but no pain within the breast tissue itself.  Right ankle has a small effusion as compared to the left, no erythema or warmth.  No tenderness to palpation along the bilateral malleoli or along the navicular.  There is no tenderness along the base of the fifth metatarsal Psych: Pleasant and appropriate      Normal echocardiogram in August 2021 ASSESSMENT/PLAN:   Essential hypertension,  benign At goal today.  Continue current therapy  Chronic abdominal pain Recently had a bout of diverticulitis requiring antibiotics.  Discussed increasing fiber in her diet to reduce attacks.    Mastalgia I suspect this is in part due to macromastia and pectoralis pain.  Other causes of atypical type chest pain must be considered I also considered pulmonary embolism although this is less likely given normal vital signs, lack of pleuritic component and no edema.  Coronary artery disease less also less likely as there is no exertional component and pain is reproducible.  Pericarditis associate with lupus or lupus myocarditis is possible although also  less likely given the symptomatology.  I suspect this is related to pectoralis strain given it is reproducible there and associated mastalgia.  Recommended supportive bras diagnostic mammograms ordered.  If this persists or is not improved we will evaluate further with an x-ray, EKG and consideration of referral to cardiology.  Right ankle pain query if due to osteoarthritis, history of prior injury or lupus.  There is no signs of erosive synovitis on examination today obtain x-ray.  Consider Voltaren gel  HCM- At follow up discuss eye doctor and dysphagia (GI is Dr. Elnoria Howard)     [name], MD  Family Medicine Teaching Service  [facility]

## 2020-08-19 NOTE — Assessment & Plan Note (Signed)
At goal today. Continue current therapy.  

## 2020-08-19 NOTE — Patient Instructions (Addendum)
It was wonderful to see you today.  Please bring ALL of your medications with you to every visit.   Today we talked about:  --- Going to get an x-ray ankle  --- Doing exercises for your leg --3 X per day   --- I will call you with ankle x-ray results   --If you decide you would like a breast reduction, please let me know--it is worth talking about with the surgeon as well      Thank you for choosing  Family Medicine.    Please call [phone] with any questions about today's appointment.  Please be sure to schedule follow up at the front  desk before you leave today.   [name], MD  Family Medicine

## 2020-08-29 ENCOUNTER — Other Ambulatory Visit: Payer: Self-pay | Admitting: Family Medicine

## 2020-08-29 DIAGNOSIS — N644 Mastodynia: Secondary | ICD-10-CM

## 2020-08-30 ENCOUNTER — Other Ambulatory Visit: Payer: Self-pay | Admitting: Family Medicine

## 2020-08-30 DIAGNOSIS — N644 Mastodynia: Secondary | ICD-10-CM

## 2020-09-01 ENCOUNTER — Other Ambulatory Visit: Payer: Self-pay | Admitting: Neurology

## 2020-09-08 ENCOUNTER — Ambulatory Visit
Admission: RE | Admit: 2020-09-08 | Discharge: 2020-09-08 | Disposition: A | Discharge status: Home / Self Care | Payer: 59 | Source: Ambulatory Visit | Attending: Family Medicine | Admitting: Family Medicine

## 2020-09-08 DIAGNOSIS — M25571 Pain in right ankle and joints of right foot: Secondary | ICD-10-CM

## 2020-09-14 ENCOUNTER — Telehealth: Payer: Self-pay | Admitting: Family Medicine

## 2020-09-14 DIAGNOSIS — G8929 Other chronic pain: Secondary | ICD-10-CM

## 2020-09-14 DIAGNOSIS — J301 Allergic rhinitis due to pollen: Secondary | ICD-10-CM

## 2020-09-14 MED ORDER — FEXOFENADINE HCL 60 MG PO TABS: 60.0000 mg | ORAL_TABLET | Freq: Two times a day (BID) | ORAL | 2 refills | Status: DC

## 2020-09-14 NOTE — Telephone Encounter (Signed)
Called patient about x-ray of ankle. She has since seen Rheumatology, they do not feel ankle related to SLE/RA. 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  Given ongoing symptoms, recommend PT evaluation and consideration of SM evaluation. Patient amenable to seeing Sports Medicine for possible diagnostic ultrasound. Declined PT.  [name], MD  Family Medicine Teaching Service

## 2020-09-23 ENCOUNTER — Other Ambulatory Visit: Payer: Self-pay

## 2020-09-23 DIAGNOSIS — F419 Anxiety disorder, unspecified: Secondary | ICD-10-CM

## 2020-09-23 MED ORDER — BUSPIRONE HCL 5 MG PO TABS: 5.0000 mg | ORAL_TABLET | Freq: Two times a day (BID) | ORAL | 2 refills | Status: DC

## 2020-09-27 ENCOUNTER — Ambulatory Visit (INDEPENDENT_AMBULATORY_CARE_PROVIDER_SITE_OTHER): Payer: 59 | Admitting: Sports Medicine

## 2020-09-27 ENCOUNTER — Other Ambulatory Visit: Payer: Self-pay

## 2020-09-27 DIAGNOSIS — M25571 Pain in right ankle and joints of right foot: Secondary | ICD-10-CM

## 2020-09-27 DIAGNOSIS — G8929 Other chronic pain: Secondary | ICD-10-CM

## 2020-09-27 MED ORDER — MELOXICAM 15 MG PO TABS: ORAL_TABLET | ORAL | 0 refills | Status: DC

## 2020-09-27 NOTE — Patient Instructions (Signed)
It was great to meet you today! Thank you for letting me participate in your care!  Today, we discussed your right ankle pain and swelling which is due to an overuse injury. Ideally, you would be able to rest it some. When not working please elevate and apply and ice pack multiple times for at least 10 minutes at a time. Try the compression sleeve and wear something during work that supports your ankle. Please also wear a supportive shoe while at work. I will see you back in 3 weeks and if you are not improving we can consider getting you a custom orthotic insert for your shoes. You can also take Meloxicam once per day with food.  Be well, Jules Schick, DO PGY-4, Sports Medicine Fellow [facility]

## 2020-09-27 NOTE — Assessment & Plan Note (Signed)
Suspect overuse injury as she has been working more and staying on her feet longer longer without any real rest or ability to take a break.  I did recommend that during her downtime what ever time she has she ice and elevate the right ankle and try or compression sleeve and wearing that when active but not to bed.  Also prescribed meloxicam as start for 7 days once with food with no other NSAIDs and then take only as needed.  I am hopeful that this will get her a little bit ahead of the pain and swelling.  We will see her back in 2 to 3 weeks and if no improvement we will start with some inserts for shoes.  Of note she has not wearing supportive shoes to work and is wearing flat sandals this also could be contributing.  So we recommended that she wear supportive shoes while at work to see if this helps with the issue.

## 2020-09-27 NOTE — Progress Notes (Signed)
    SUBJECTIVE:   CHIEF COMPLAINT / HPI:   Right Ankle Swelling Veronica [name] is a very pleasant 59y/o female who is presenting today per referral by Dr. Manson Passey from [facility]. She has been having over one year of right ankle swelling and pain with weight bearing and standing for long periods of time. She denies any known trauma or inciting event. It not hurts and swells all the time with minimal activity. 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 She has tried OTC medications with little relief. She was seen and evaluated by a Rheumatologist and RA/SLE was ruled out as a contributing cause. She had x-rays which were negative for fracture or any osseous abnormality.   PERTINENT  PMH / PSH: HTN, Migraines, Prediabetes, SLE, mood disorder  OBJECTIVE:   BP 140/82   Ht 5\' 4"  (1.626 m)   Wt 225 lb (102.1 kg)   BMI 38.62 kg/m  No flowsheet data found.  Ankle/Foot, Right: No visible erythema, mild swelling just inferior to lateral malleolus, ecchymosis, or bony deformity. No notable pes planus deformity. Transverse arch grossly intact; No evidence of tibiotalar deviation; Range of motion is full in all directions. Strength is 5/5 in all directions. No tenderness at the Achilles tendon; TTP over the peroneal tendons; No other tenderness.  Able to walk 4 steps.  Talar Tilt: Neg Ant Drawer Test: Neg  ASSESSMENT/PLAN:   Right ankle pain Suspect overuse injury as she has been working more and staying on her feet longer longer without any real rest or ability to take a break.  I did recommend that during her downtime what ever time she has she ice and elevate the right ankle and try or compression sleeve and wearing that when active but not to bed.  Also prescribed meloxicam as start for 7 days once with food with no other NSAIDs and then take only as needed.  I am hopeful that this will  get her a little bit ahead of the pain and swelling.  We will see her back in 2 to 3 weeks and if no improvement we will start with some inserts for shoes.  Of note she has not wearing supportive shoes to work and is wearing flat sandals this also could be contributing.  So we recommended that she wear supportive shoes while at work to see if this helps with the issue.     , DO PGY-4, Sports Medicine Fellow [facility]  Patient seen and evaluated with the sports medicine fellow.  I agree with the above plan of care.  X-rays are unremarkable and bedside ultrasound showed only some mild soft tissue swelling.  I believe her symptoms are secondary to her job which requires her to stand and walk for long periods of time coupled with the fact that she wears sandals instead of good abortive shoes.  I recommended that she try wearing her tennis shoes at work.  If she continues to have issues then we can try some additional arch support for her.  She will follow-up as needed.

## 2020-10-03 ENCOUNTER — Other Ambulatory Visit: Payer: 59

## 2020-10-18 ENCOUNTER — Ambulatory Visit: Payer: 59 | Admitting: Sports Medicine

## 2020-10-25 NOTE — Progress Notes (Signed)
NEUROLOGY FOLLOW UP OFFICE NOTE  Veronica [name] [id number]  Assessment/Plan:   Transient ischemic attack  Tension-type headache, not intractable  Hypertension  1.Topiramate 50mg  at bedtime 2.Secondary stroke prevention as managed by PCP: - ASA 81mg  daily - Statin.  LDL goal less than 70 - Normotensive blood pressure - Hgb A1c goal less than 7 3. Follow up one year  Subjective:  Veronica [name] is a 60 year old right-handed woman with SLE, hypertension, and allergic rhinitis who follows up for TIA and headache.   UPDATE: Current medications for secondary stroke prevention:  ASA 81mg , atorvastatin 10mg , losartan   Completed stroke workup: 05/24/2020 2D echocardiogram:  EF 60-65% with no cardiac source of emboli. 12 day Cardiac event monitor:  No afib or significant heart block  Headaches are now improved with topiramate.  They are mild and infrequent lasting an hour.  Current NSAIDS:  Naproxen 500mg   Current analgesics:  Tylenol Current triptans:  none Current ergotamine:  none Current anti-emetic:  none Current muscle relaxants:  Flexeril  Current anti-anxiolytic:  none Current sleep aide:  none Current Antihypertensive medications:  Losartan, indapamide, spironolactone Current Antidepressant medications:  fluoxetine Current Anticonvulsant medications:  topiramate 50mg  at bedtime Current anti-CGRP:  none Current Vitamins/Herbal/Supplements:  none Current Antihistamines/Decongestants:  Zyrtec Other therapy:  Vicks vapor rub to forehead Other medication:  Plaquenil   Caffeine:  No coffee.  Sometimes Coke Diet:  Drinks 3 bottles of 16.9 oz water daily.  1 can of Fanta Orange or [employer] Exercise:  no Depression:  no; Anxiety:  Increased work-related anxiety Other pain:  no Sleep hygiene:  Poor.  Diagnosed with mild OSA in 2019. Instructed to lose weight and follow up.  She reports having lost weight but sleep has not improved.     HISTORY:  She was seen in  the ED at [facility] on 12/05/2019 for left sided facial droop and slurred speech.  She was on the phone when she started feeling lightheaded and noted slurred speech.  She called 911 and when EMS arrived, they noticed subtle left sided facial droop.  She had an associated severe pounding frontal headache.  No unilateral numbness or weakness of extremities. She presented as a code stroke.  Blood pressure initially 130 systolic but later 170.  No objective neurologic signs on exam in the ED (NIHSS 0).  CT and MRI of brain personally reviewed were unremarkable.  tPA was not administered due to resolution of symptoms.  She was discharged on ASA 81mg  daily.  Since then, she has had a daily headache.  She also has feeling of heat in her body and has increased nervousness and shakiness.  She feels off balance.  When she closes her eyes, she sees darkness that is moving quickly across her visual field.  Due to increased anxiety, she was started on Prozac.  Lipitor was increased from 10mg  to 40mg  daily.  CTA head and neck on 01/08/2020 showed no large vessel stenosis or occlusion.  In December 2021, she has had episodes of "black outs" in which she is standing and suddenly feels off balance, possibly loss of awareness for a second or so.  She also has been stuttering or have slurred speech.  Orthostatic vitals reportedly normal.  She was told that it could have been dehydration so she started increasing water intake and it has resolved.     She has history of headaches since 2012-2013, described as moderate bifrontal non-throbbing headache with slight dizziness.   She has  a known partially empty sella on MRI of brain with and without contrast from 04/08/15 and MRI of brain without contrast on 07/12/11.  She has her eyes examined annually.  No significant issues.     Labs from July:  Hgb A1c 5.5; LDL 60 Labs from August:  SARS Coronvirus 2 negative; TSH 1.150   Past NSAIDS:  ibuprofen Past analgesics:   Excedrin Past abortive triptans:  none Past abortive ergotamine:  none Past muscle relaxants:  none Past anti-emetic:  none Past antihypertensive medications:  none Past antidepressant medications:  none Past anticonvulsant medications:  none Past anti-CGRP:  none Past vitamins/Herbal/Supplements:  none Past antihistamines/decongestants:  none Other past therapies:  none     Sister has MS.  PAST MEDICAL HISTORY: Past Medical History:  Diagnosis Date   Daily headache    Fibromyalgia    G6PD deficiency    outside labs--low level, no longer on HCQ   Hypertension    Prediabetes    Rheumatoid arthritis (HCC)    SLE (systemic lupus erythematosus) (HCC) 1998    MEDICATIONS: Current Outpatient Medications on File Prior to Visit  Medication Sig Dispense Refill   aspirin EC 81 MG tablet Take 1 tablet (81 mg total) by mouth daily. Swallow whole. 30 tablet 11   atorvastatin (LIPITOR) 10 MG tablet Take 1 tablet (10 mg total) by mouth daily. 90 tablet 3   busPIRone (BUSPAR) 5 MG tablet Take 1 tablet (5 mg total) by mouth 2 (two) times daily. 60 tablet 2   cetirizine (ZYRTEC) 10 MG tablet Take 1 tablet (10 mg total) by mouth daily as needed for allergies. 90 tablet 3   fexofenadine (ALLEGRA) 60 MG tablet Take 1 tablet (60 mg total) by mouth 2 (two) times daily. 60 tablet 2   FLUoxetine (PROZAC) 20 MG capsule Take 1 capsule (20 mg total) by mouth daily. 90 capsule 2   fluticasone (FLONASE) 50 MCG/ACT nasal spray Place 2 sprays into both nostrils daily. 16 g 6   hydroxychloroquine (PLAQUENIL) 200 MG tablet Take 400 mg by mouth at bedtime.      indapamide (LOZOL) 2.5 MG tablet Take 1 tablet (2.5 mg total) by mouth daily. 90 tablet 2   losartan (COZAAR) 100 MG tablet Take 1 tablet (100 mg total) by mouth daily. 90 tablet 1   meloxicam (MOBIC) 15 MG tablet Take 1 tablet daily with food for 7 days. Then take as needed. 30 tablet 0   omeprazole (PRILOSEC) 20 MG capsule Take 1 capsule (20 mg  total) by mouth daily. 30 capsule 3   spironolactone (ALDACTONE) 25 MG tablet Take 1 tablet (25 mg total) by mouth daily. 90 tablet 2   topiramate (TOPAMAX) 25 MG tablet TAKE 1 TABLET BY MOUTH AT BEDTIME FOR 1 WEEK THEN INCREASE TO 2 TABLETS AT BEDTIME 60 tablet 0   No current facility-administered medications on file prior to visit.    ALLERGIES: Allergies  Allergen Reactions   Amlodipine Swelling    Leg edema   Tessalon [Benzonatate] Other (See Comments)    Lip swelling    FAMILY HISTORY: Family History  Problem Relation Age of Onset   Diabetes Mother    Hypertension Father    Multiple sclerosis Sister    Hypertension Brother       Objective:  Blood pressure 116/79, pulse 66, height 5\' 4"  (1.626 m), weight 230 lb (104.3 kg), SpO2 99 %. General: No acute distress.  Patient appears well-groomed.   Head:  Normocephalic/atraumatic Eyes:  Fundi examined but not visualized Neck: supple, no paraspinal tenderness, full range of motion Heart:  Regular rate and rhythm Lungs:  Clear to auscultation bilaterally Back: No paraspinal tenderness Neurological Exam: alert and oriented to person, place, and time.  Speech fluent and not dysarthric, language intact.  CN II-XII intact. Bulk and tone normal, muscle strength 5/5 throughout.  Sensation to light touch intact.  Deep tendon reflexes 2+ throughout, toes downgoing.  Finger to nose testing intact.  Gait normal, Romberg negative.    Shon Millet, DO  CC: [name], MD

## 2020-10-26 ENCOUNTER — Other Ambulatory Visit: Payer: Self-pay

## 2020-10-26 ENCOUNTER — Ambulatory Visit (INDEPENDENT_AMBULATORY_CARE_PROVIDER_SITE_OTHER): Payer: 59 | Admitting: Neurology

## 2020-10-26 ENCOUNTER — Encounter: Payer: Self-pay | Admitting: Neurology

## 2020-10-26 VITALS — BP 116/79 | HR 66 | Ht 64.0 in | Wt 230.0 lb

## 2020-10-26 DIAGNOSIS — G459 Transient cerebral ischemic attack, unspecified: Secondary | ICD-10-CM

## 2020-10-26 DIAGNOSIS — G44219 Episodic tension-type headache, not intractable: Secondary | ICD-10-CM | POA: Diagnosis not present

## 2020-10-26 NOTE — Patient Instructions (Signed)
Continue topiramate 50mg  at bedtime Aspirin 81mg  daily atorvastin

## 2020-11-01 ENCOUNTER — Ambulatory Visit: Payer: 59 | Admitting: Sports Medicine

## 2020-11-07 ENCOUNTER — Other Ambulatory Visit: Payer: Self-pay

## 2020-11-07 ENCOUNTER — Ambulatory Visit (INDEPENDENT_AMBULATORY_CARE_PROVIDER_SITE_OTHER): Payer: 59 | Admitting: Family Medicine

## 2020-11-07 ENCOUNTER — Encounter: Payer: Self-pay | Admitting: Family Medicine

## 2020-11-07 VITALS — BP 137/72 | HR 65 | Wt 230.6 lb

## 2020-11-07 DIAGNOSIS — Z23 Encounter for immunization: Secondary | ICD-10-CM | POA: Diagnosis not present

## 2020-11-07 DIAGNOSIS — R350 Frequency of micturition: Secondary | ICD-10-CM | POA: Diagnosis not present

## 2020-11-07 DIAGNOSIS — M329 Systemic lupus erythematosus, unspecified: Secondary | ICD-10-CM

## 2020-11-07 DIAGNOSIS — I10 Essential (primary) hypertension: Secondary | ICD-10-CM

## 2020-11-07 DIAGNOSIS — G8929 Other chronic pain: Secondary | ICD-10-CM

## 2020-11-07 DIAGNOSIS — R1310 Dysphagia, unspecified: Secondary | ICD-10-CM

## 2020-11-07 DIAGNOSIS — G5601 Carpal tunnel syndrome, right upper limb: Secondary | ICD-10-CM

## 2020-11-07 DIAGNOSIS — M25512 Pain in left shoulder: Secondary | ICD-10-CM

## 2020-11-07 DIAGNOSIS — R1319 Other dysphagia: Secondary | ICD-10-CM

## 2020-11-07 LAB — POCT URINALYSIS DIP (MANUAL ENTRY)
Bilirubin, UA: NEGATIVE
Blood, UA: NEGATIVE
Glucose, UA: NEGATIVE mg/dL
Ketones, POC UA: NEGATIVE mg/dL
Leukocytes, UA: NEGATIVE
Nitrite, UA: NEGATIVE
Protein Ur, POC: NEGATIVE mg/dL
Spec Grav, UA: 1.02 (ref 1.010–1.025)
Urobilinogen, UA: 0.2 E.U./dL
pH, UA: 7 (ref 5.0–8.0)

## 2020-11-07 MED ORDER — SHINGRIX 50 MCG/0.5ML IM SUSR: 0.5000 mL | INTRAMUSCULAR | 0 refills | Status: AC

## 2020-11-07 NOTE — Progress Notes (Signed)
SUBJECTIVE   CHIEF COMPLAINT: urinary symptoms HPI:   Veronica [name] is a 60 y.o. yo with history notable for obesity, prediabetes, hypertension and carpal tunnel syndrome status post release on the left presenting for urinary frequency.   The patient reports several years to months of worsening urinary urgency and frequency.  She reports history of the bathroom frequently and then only voids a small amount.  She feels that she empties her bladder completely.  She does endorse intermittent leakage of urine to small volumes when she coughs or sneeze.  She denies vaginal discharge.  She denies hematuria, dysuria, nocturia.  She believes this is related to anxiety.  She drinks tea 1-2 times a week.  She does not drink any caffeinated beverages she does not drink sodas or carbonated beverages.  She does not require juices she does not eat spicy foods.  The patient reports ongoing difficulty swallowing at times.  This is significantly improved since increasing her omeprazole to twice a day.  She talk to Dr. Haywood Pao office but did not discuss this with him.  She denies melena hematochezia.  Her weight is stable.  The patient reports intermittent left shoulder pain.  This is localized today over the left biceps tendon.  She denies hand numbness or weakness.  She does endorse shoulder pain at times.  She denies chest pain with exertion.  She denies dyspnea or palpitations.  She denies neck pain or radiating pain from the shoulder.  The patient reports she continues to work regularly.  Her husband has not yet found a sustainable job.  She finds that her anxiety is relatively under control.  The patient has a history of left-sided carpal tunnel release.  She reports for the past several months has had worsening right hand numbness at night.  She previously used night splints without relief.  She is not using them currently.  She denies elbow pain, shoulder pain, neck pain.  She had great success with  her hand surgery on the left.  PERTINENT  PMH / PSH/Family/Social History : Updated and reviewed as appropriate  OBJECTIVE:   BP 137/72   Pulse 65   Wt 230 lb 9.6 oz (104.6 kg)   SpO2 100%   BMI 39.58 kg/m   Today's weight:  Last Weight  Most recent update: 11/07/2020 11:43 AM    Weight  104.6 kg (230 lb 9.6 oz)            Review of prior weights: Filed Weights   11/07/20 1142  Weight: 230 lb 9.6 oz (104.6 kg)   Cardiac: Regular rate and rhythm. Normal S1/S2. No murmurs, rubs, or gallops appreciated. Lungs: Clear bilaterally to ascultation.  Psych: Pleasant and appropriate  MSK exam Neck exam and she has good range of motion no tenderness along the cervical spine.  She does have mild tenderness along the left trapezius.   Shoulders appear symmetric and normal she has mild thoracic kyphosis Tenderness to palpation along left AC joint and biceps tendon Full forward flexion no pain with abduction, good range of motion of the bilateral shoulders in abduction and abduction forward flexion Negative Hawkins, negative Neer testing  Bilateral hands examined there is no thenar atrophy on either side.  Hands appear symmetric and normal There is no low overlying warmth or erythema Normal sensation on my examination 5/5 grip strength 5/5 thumb abduction strength   ASSESSMENT/PLAN:   SLE (systemic lupus erythematosus) (HCC) The patient's ophthalmology in March and will follow-up in  October  Esophageal dysphagia The patient recently increased her omeprazole to twice daily this is improved her symptoms.  When she does not take it she does endorse a sensation of difficulty with food getting stuck.  Referred back to gastroenterology for this.   Left-sided shoulder pain this is been ongoing and is now changed could be due to tendinopathy of the left shoulder versus acromioclavicular joint arthritis, less likely arthritis of the shoulder itself given her excellent range of motion also  considered but less likely frozen shoulder given symptoms.  Also considered cardiac etiology requested and recommended to have an EKG done today the patient would like to leave today.  She will schedule nurse follow-up for this.  Hand numbness this is most prominent on the right, this is the patient's dominant hand, symptoms bother her most at night.  Unable to specifically localize it to digits 1 through 3, hot suspect that carpal tunnel is most likely.  Recommended night splints and referred to hand surgeon per patient preference.  History of tendinopathy of right ankle most consistent with overuse injury she is intermittently been using Mobic, given concomitant hypertension we will check renal function today  Urinary frequency unclear if this is related to painful bladder syndrome, urinary tract infection or food triggers.  Discussed food triggers and she will generally avoid these.  We will check kidney function consider Myrbetriq in the future  Healthcare maintenance, she received her COVID-vaccine today she is due for her pneumonia follow-up.  Shingrix  printed for her    [name], MD  Family Medicine Teaching Service  [facility]

## 2020-11-07 NOTE — Progress Notes (Signed)
Covid-19 Vaccination Clinic  Name:  Veronica [name]    MRN: [id number] DOB: [birth year]  11/07/2020  [name] was observed post Covid-19 immunization for 15 minutes without incident. She was provided with Vaccine Information Sheet and instruction to access the V-Safe system.   [name] was instructed to call 911 with any severe reactions post vaccine: Difficulty breathing  Swelling of face and throat  A fast heartbeat  A bad rash all over body  Dizziness and weakness

## 2020-11-07 NOTE — Assessment & Plan Note (Signed)
The patient recently increased her omeprazole to twice daily this is improved her symptoms.  When she does not take it she does endorse a sensation of difficulty with food getting stuck.  Referred back to gastroenterology for this.

## 2020-11-07 NOTE — Assessment & Plan Note (Signed)
The patient's ophthalmology in March and will follow-up in October

## 2020-11-07 NOTE — Patient Instructions (Addendum)
It was wonderful to see you today.  Please bring ALL of your medications with you to every visit.   Today we talked about:  -- Going to [facility] imaging for a shoulder xray  WEAR YOUR NIGHT SPLINTS ON THE RIGHT HAND  You will be called by Hand Surgery  Please schedule a nurse visit for an EKG  I will call you with results of your urine test    Thank you for choosing [facility].   Please call [phone] with any questions about today's appointment.  Please be sure to schedule follow up at the front  desk before you leave today.   [name], MD  Family Medicine

## 2020-11-08 ENCOUNTER — Telehealth: Payer: Self-pay | Admitting: Family Medicine

## 2020-11-08 ENCOUNTER — Encounter: Payer: Self-pay | Admitting: Family Medicine

## 2020-11-08 DIAGNOSIS — N3281 Overactive bladder: Secondary | ICD-10-CM

## 2020-11-08 LAB — BASIC METABOLIC PANEL
BUN/Creatinine Ratio: 15 (ref 9–23)
BUN: 15 mg/dL (ref 6–24)
CO2: 23 mmol/L (ref 20–29)
Calcium: 9.5 mg/dL (ref 8.7–10.2)
Chloride: 101 mmol/L (ref 96–106)
Creatinine, Ser: 1.01 mg/dL — ABNORMAL HIGH (ref 0.57–1.00)
Glucose: 86 mg/dL (ref 65–99)
Potassium: 3.8 mmol/L (ref 3.5–5.2)
Sodium: 140 mmol/L (ref 134–144)
eGFR: 65 mL/min/{1.73_m2} (ref >59)

## 2020-11-08 MED ORDER — MIRABEGRON ER 25 MG PO TB24: 25.0000 mg | ORAL_TABLET | Freq: Every day | ORAL | 0 refills | Status: DC

## 2020-11-08 NOTE — Telephone Encounter (Signed)
Called patient's with results.  No renal etiology of her bladder symptoms identified.  Discussed Myrbetriq sent MyChart message with the name for her to look up she will message back to determine if she would like to pursue this treatment.

## 2020-11-10 ENCOUNTER — Encounter: Payer: Self-pay | Admitting: Family Medicine

## 2020-11-11 ENCOUNTER — Telehealth: Payer: Self-pay

## 2020-11-11 ENCOUNTER — Encounter: Payer: Self-pay | Admitting: Family Medicine

## 2020-11-11 NOTE — Telephone Encounter (Signed)
Sent patient message via mychart about these symptoms.  [name], MD  Family Medicine Teaching Service

## 2020-11-11 NOTE — Telephone Encounter (Signed)
Patients insurance will not approve MYRBETRIQ with out documented trail and failure of below medications.   darifenacin, solifenacin, oxybutynin, tolterodine, or trospium.  Please advise.

## 2020-11-15 ENCOUNTER — Other Ambulatory Visit: Payer: Self-pay

## 2020-11-15 ENCOUNTER — Ambulatory Visit (INDEPENDENT_AMBULATORY_CARE_PROVIDER_SITE_OTHER): Payer: 59 | Admitting: Sports Medicine

## 2020-11-15 VITALS — BP 141/70 | Ht 64.0 in | Wt 230.0 lb

## 2020-11-15 DIAGNOSIS — M25571 Pain in right ankle and joints of right foot: Secondary | ICD-10-CM

## 2020-11-15 DIAGNOSIS — G8929 Other chronic pain: Secondary | ICD-10-CM | POA: Diagnosis not present

## 2020-11-15 NOTE — Progress Notes (Signed)
PCP: Westley Chandler, MD  Subjective:   HPI: Patient is a pleasant 60 y.o. female here for follow-up of right ankle pain.    She was seen on 09/27/20 to have her right ankle pain evaluated. At that time, it was suspected to be an over-use injury and she was given Mobic to take as needed and fitted for a body helix ankle sleeve. Since this time, the patient states she is doing better. No new injuries or aggravation. She is still very active on her feet, but with wearing the ankle sleeve, she has less pain and more support throughout the day. She does feel like this minimizes swelling as well. Her pain is still located over the lateral ankle, moreso over the peroneal tendons. Her pain is worse at the end of the day, usually 5/10 but with the sleeve she is able to perform all ADL's and normal activity without difficulty. She has taken Mobic and Naproxen as needed, but unsure if these help or not. She is pleased with her management and does not feel she needs anything additional today.  Past Medical History:  Diagnosis Date   Daily headache    Fibromyalgia    G6PD deficiency    outside labs--low level, no longer on HCQ   Hypertension    Prediabetes    Rheumatoid arthritis (HCC)    SLE (systemic lupus erythematosus) (HCC) 1998    BP (!) 141/70   Ht 5\' 4"  (1.626 m)   Wt 230 lb (104.3 kg)   BMI 39.48 kg/m   No flowsheet data found.  No flowsheet data found.  Review of Systems: See HPI above.     Objective:  Physical Exam:  Gen: Well-appearing, in no acute distress; non-toxic CV: Well-perfused. Warm.  Resp: Breathing unlabored on room air; no wheezing. Psych: Fluid speech in conversation; appropriate affect; normal thought process Neuro: Sensation intact throughout. No gross coordination deficits.  MSK:  Ankle/Foot, Right: very mild TTP noted over the peroneal tendons just inferior to lateral malleolus. No visible erythema, swelling, ecchymosis, or bony deformity. No notable pes  planus/cavus deformity. Transverse arch grossly intact; Normal eversion/inversion stress testing. Range of motion is full in all directions. Strength is 5/5 in all directions. There is clicking of the peroneal tendons with inversion/eversion but no subluxation or pain associated.; Stable lateral and medial ligaments; No tenderness at the distal metatarsals; Able to ambulate without pain or difficulty. Provocative testing: Negative anterior drawer, talar tilt.  Gait analysis: B/l ankle external and tibial external rotation noted in stance phase, R > L. Normal heel-strike and transfer, however there is excessive supination b/l during heel strike to mid-stance.      Assessment & Plan:  1. Right ankle pain - improving. Diagnosis is likely peroneal tendinosis 2/2 to overuse. Her pain and support has improved with the body helix ankle sleeve and wearing more supportive shoewear (wearing soled-shoes instead of sandals/flip flops). She is able to do her ADL's and normal activity without difficulty at this point and is happy with her improvement. We did discuss future options such as a lateral heel wedge to help counteract her excessive supination--she may return or make appt if she desires this or her symptoms worsen. Otherwise, work on healthy weight loss, continue supportive shoewear, body helix sleeve, and NSAIDs only as needed.  , DO PGY-4, Sports Medicine Fellow [facility]

## 2020-11-16 ENCOUNTER — Ambulatory Visit
Admission: RE | Admit: 2020-11-16 | Discharge: 2020-11-16 | Disposition: A | Discharge status: Home / Self Care | Payer: 59 | Source: Ambulatory Visit | Attending: Family Medicine | Admitting: Family Medicine

## 2020-11-16 ENCOUNTER — Encounter: Payer: Self-pay | Admitting: Family Medicine

## 2020-11-16 ENCOUNTER — Ambulatory Visit: Payer: 59

## 2020-11-16 DIAGNOSIS — N644 Mastodynia: Secondary | ICD-10-CM

## 2020-12-27 ENCOUNTER — Other Ambulatory Visit: Payer: Self-pay

## 2020-12-27 DIAGNOSIS — I10 Essential (primary) hypertension: Secondary | ICD-10-CM

## 2020-12-27 MED ORDER — SPIRONOLACTONE 25 MG PO TABS: 25.0000 mg | ORAL_TABLET | Freq: Every day | ORAL | 2 refills | Status: DC

## 2020-12-28 ENCOUNTER — Other Ambulatory Visit: Payer: Self-pay

## 2020-12-28 ENCOUNTER — Other Ambulatory Visit: Payer: Self-pay | Admitting: Family Medicine

## 2020-12-28 MED ORDER — ATORVASTATIN CALCIUM 10 MG PO TABS: 10.0000 mg | ORAL_TABLET | Freq: Every day | ORAL | 3 refills | Status: DC

## 2021-01-06 ENCOUNTER — Encounter: Payer: Self-pay | Admitting: Family Medicine

## 2021-01-06 ENCOUNTER — Ambulatory Visit (INDEPENDENT_AMBULATORY_CARE_PROVIDER_SITE_OTHER): Payer: 59 | Admitting: Family Medicine

## 2021-01-06 ENCOUNTER — Other Ambulatory Visit: Payer: Self-pay

## 2021-01-06 VITALS — BP 130/74 | HR 64 | Wt 227.8 lb

## 2021-01-06 DIAGNOSIS — R1319 Other dysphagia: Secondary | ICD-10-CM

## 2021-01-06 DIAGNOSIS — R350 Frequency of micturition: Secondary | ICD-10-CM

## 2021-01-06 DIAGNOSIS — F4329 Adjustment disorder with other symptoms: Secondary | ICD-10-CM | POA: Diagnosis not present

## 2021-01-06 DIAGNOSIS — M545 Low back pain, unspecified: Secondary | ICD-10-CM

## 2021-01-06 DIAGNOSIS — F39 Unspecified mood [affective] disorder: Secondary | ICD-10-CM

## 2021-01-06 DIAGNOSIS — Z23 Encounter for immunization: Secondary | ICD-10-CM | POA: Diagnosis not present

## 2021-01-06 DIAGNOSIS — R7303 Prediabetes: Secondary | ICD-10-CM

## 2021-01-06 DIAGNOSIS — B360 Pityriasis versicolor: Secondary | ICD-10-CM

## 2021-01-06 LAB — POCT URINALYSIS DIP (MANUAL ENTRY)
Bilirubin, UA: NEGATIVE
Blood, UA: NEGATIVE
Glucose, UA: NEGATIVE mg/dL
Ketones, POC UA: NEGATIVE mg/dL
Leukocytes, UA: NEGATIVE
Nitrite, UA: NEGATIVE
Protein Ur, POC: NEGATIVE mg/dL
Spec Grav, UA: 1.025 (ref 1.010–1.025)
Urobilinogen, UA: 0.2 E.U./dL
pH, UA: 5.5 (ref 5.0–8.0)

## 2021-01-06 MED ORDER — KETOCONAZOLE 2 % EX CREA: 1.0000 "application " | TOPICAL_CREAM | Freq: Every day | CUTANEOUS | 0 refills | Status: AC

## 2021-01-06 NOTE — Assessment & Plan Note (Signed)
Given current stressors she is having some difficulty adjusting, referred to chronic care management to connect with therapy.  She has had barriers in the past with connecting with a therapist that is affordable with her [employer].  We appreciate their guidance and support.  She is amenable to referral and call from chronic care management team.

## 2021-01-06 NOTE — Patient Instructions (Addendum)
It was wonderful to see you today.  Please bring ALL of your medications with you to every visit.   Today we talked about:  --Giving a urine sample--I will call you with results--please call if the right sided pain recurs  - Our care team will be reaching out about a therapist covered by [employer]   - You are due for your PPD (tb) test October 1--schedule a nurse visit for this  - Follow up in 2- 3 months--you will be due for your COVID booster (the new one)   CONGRATULATIONS on your WEIGHT LOSS! Keep up the activity!    Thank you for choosing [facility].   Please call [phone] with any questions about today's appointment.  Please be sure to schedule follow up at the front  desk before you leave today.   [name], MD  Family Medicine    Therapy and Counseling Resources  You can check out [email or web address].com for counselors accepted by your insurance    BestDay:Psychiatry and Counseling [address] [place], [place] [address] [phone]  [facility]  [address] [place], [place] [address]      [phone]  [facility] [address]  [place], [place] [address] [phone]  [facility] [address]  [place], [place] [address]       [phone]     [facility] (virtual only) [phone]  Jovita Kussmaul [facility] [address], [place], [place] [phone]  [facility]:  [address] [place] [phone]  [facility]:  [address] Hessie Diener [phone]  [facility] (under & uninsured) [address]   [place] [phone]    [email or web address]    [facility] [address]  [place]    [phone]  [facility] [place] -[address]     Phone:  [phone]     [facility]-  [address]  [place]  [phone]   [facility] [address]      [place], [place] [phone]  [facility]: [address] [place], [place], [place]  [phone]   [facility] (Spanish therapist) [email or web address]  [address] [place]  [address]   [place] [address]    [phone]    [facility]   [address],  [place], [place]  [phone]   [facility]  [address] [place]  [phone]  [facility]  [address] [place], [place]        [phone]  Open Access/Walk In Clinic under & uninsured  [facility]  [address] [place], [place] [phone] Crisis [phone]  [facility] [place],  (Spanish)   [address], [place]: [phone]) 8:30 - 12; 1 - 2:30  [facility] [employer],  [address] [place], [place]    [phone]):8:30 - 12; 2 - 3PM  [facility] [employer],  [address]  [place]; [phone]):   Mon - Fri 8 AM - 5 PM  [facility] [name] [place]  MWF 12:30 to 3:00 or call to schedule an appointment  [phone]  Specific Provider options Psychology Today  [email or web address].[email or web address]/us click on find a therapist  enter your zip  code left side and select or tailor a therapist for your specific need.   [facility] Provider Directory [email or web address]  (Medicaid)   Follow all drop down to find a provider  Social Support program [facility] [place] [phone] or PhotoSolver.pl [address], [place], [place] Recovery support and educational   24- Hour Availability:   [facility]  [address] [place], [place] [phone] Crisis [phone]  [facility] [employer] [phone]  [facility]  [phone]    [facility]  [phone] (after hours)  Therapeutic Alternative/Mobile Crisis   [phone]  [place] National Suicide Hotline  [phone] Len Childs)  Call 911 or go to emergency room  [facility]  [phone]);  [place] and [employer]  [phone]); [place], [place], [place], [place], [place], [place], [place]

## 2021-01-06 NOTE — Assessment & Plan Note (Signed)
Repeat due in fall.  Follow-up in 2 to 3 months for this.

## 2021-01-06 NOTE — Progress Notes (Signed)
SUBJECTIVE:   CHIEF COMPLAINT: Rash on back and feeling stressed HPI:   Veronica [name] is a 60 y.o. yo with history notable for prediabetes presenting for follow up.   Adjustment disorder The patient has a history of mood disorder.  She has a number of stressors.  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  He does not work regularly.  She is only driver for the family.  She feels overwhelmed and feels like sometimes she just feels so stressed she has to leave the situation.  No thoughts of hurting herself or others.  She is interested in seeking therapy and previously seen Dr. Jolyne Loa for 6 sessions but would like to establish with someone long-term.   Left shoulder pain This is resolved and not recurred  Intermittent right-sided back pain The patient reports last week she had an episode of several minutes of really sharp right-sided back pain radiating from the low back around to the anterior right lower quadrant.  She denies associated, nausea, vomiting, hematuria, dysuria, radiating pain.  The pain has not recurred.  She is tried nothing for this.  She does have some urinary frequency at times.  Patient reports recurrence of an intermittent back rash.  She reports she is previously prescribed a antifungal for this and this is improved.  It is pruritic.  This occurred when it is hot out.  It is recently recurred.  No other skin changes.   PERTINENT  PMH / PSH/Family/Social History : reviewed   OBJECTIVE:   BP 130/74   Pulse 64   Wt 227 lb 12.8 oz (103.3 kg)   SpO2 100%   BMI 39.10 kg/m   Today's weight:  Last Weight  Most recent update: 01/06/2021 10:06 AM    Weight  103.3 kg (227 lb 12.8 oz)            Review of prior weights: Filed Weights   01/06/21 1005  Weight: 227 lb 12.8 oz (103.3 kg)  Cardiac exam regular rate and rhythm.  No murmurs rubs or gallops.  Lungs clear bilaterally to  auscultation.  No lower extremity edema.  She is pleasant and talkative throughout encounter.  Back is examined.  Gait is normal.  She has good range of motion of the hip.  She has full hip flexor and extensor strength.  Good range of motion of the hip.  Negative straight leg raise bilaterally.  Negative FABER test and FADIR test bilaterally.  There is no tenderness to palpation along the lumbar spine or the bilateral sacroiliac joints.  Back is examined.  There are multiple hyperpigmented benign-appearing irregularly shaped nevi.  None are larger than 0.5 cm.  None irregularly-shaped. In combination with these nevi there are multiple scattered lightly pigmented macules.   ASSESSMENT/PLAN:   Mood disorder [facility]) Given current stressors she is having some difficulty adjusting, referred to chronic care management to connect with therapy.  She has had barriers in the past with connecting with a therapist that is affordable with her [employer].  We appreciate their guidance and support.  She is amenable to referral and call from chronic care management team.  Prediabetes Repeat due in fall.  Follow-up in 2 to 3 months for this.  Intermittent right-sided back pain, differentials broad, possibly due to muscle spasm much less likely but also considered appendicitis.  Nephrolithiasis is possible but unlikely given  that this has not recurred.  Do not suspect sacroiliac joint dysfunction.  There are no red flags such as incontinence, saddle anesthesia or new neurologic changes suggestive of a bony lesion.  Will monitor for now.  Urinalysis sent to check for blood.  Will let patient know about results--discussed that if recurs, develops new symptoms, hematuria, fevers to call ASAP  Tinea versicolor Rx ketoconazole, check at follow up consider dermatology referral for Nevi   Left shoulder pain instructed to return if recurs   HCM Flu shot today PCV20 and COVID at follow up A1C at follow up in 2-3  months     [name], MD  Family Medicine Teaching Service  [facility]

## 2021-01-09 ENCOUNTER — Other Ambulatory Visit: Payer: Self-pay

## 2021-01-09 DIAGNOSIS — I10 Essential (primary) hypertension: Secondary | ICD-10-CM

## 2021-01-09 MED ORDER — LOSARTAN POTASSIUM 100 MG PO TABS: 100.0000 mg | ORAL_TABLET | Freq: Every day | ORAL | 1 refills | Status: DC

## 2021-01-10 ENCOUNTER — Encounter: Payer: Self-pay | Admitting: Family Medicine

## 2021-01-11 ENCOUNTER — Telehealth: Payer: Self-pay | Admitting: *Deleted

## 2021-01-11 NOTE — Chronic Care Management (AMB) (Signed)
Care Management   Outreach Note  01/11/2021 Name: Veronica [name] MRN: [id number] DOB: 1960/11/03  Referred by: Westley Chandler, MD Reason for referral : Care Coordination (Initial outreach to schedule referral with Licensed Clinical SW )   An unsuccessful telephone outreach was attempted today. The patient was referred to the case management team for assistance with care management and care coordination.   Follow Up Plan:  A HIPAA compliant phone message was left for the patient providing contact information and requesting a return call. The care management team will reach out to the patient again over the next 7 days.  If patient returns call to provider office, please advise to call Embedded Care Management Care Guide Misty Stanley at [phone].  Gwenevere Ghazi  Care Guide, Embedded Care Coordination [facility] Management  Direct Dial: [phone]

## 2021-01-20 NOTE — Chronic Care Management (AMB) (Signed)
Care Management   Outreach Note  01/20/2021 Name: Veronica [name] MRN: [id number] DOB: 17-Aug-1960  Referred by: Westley Chandler, MD Reason for referral : Care Coordination (Initial outreach to schedule referral with Licensed Clinical SW )   A second unsuccessful telephone outreach was attempted today. The patient was referred to the case management team for assistance with care management and care coordination.   Follow Up Plan:  The care management team will reach out to the patient again over the next 7 days. If patient returns call to provider office, please advise to call Embedded Care Management Care Guide Misty Stanley at [phone].  Gwenevere Ghazi  Care Guide, Embedded Care Coordination [facility] Management  Direct Dial: [phone]

## 2021-01-23 NOTE — Chronic Care Management (AMB) (Signed)
Care Management   Note  01/23/2021 Name: Veronica [name] MRN: [id number] DOB: June 03, 1960  Veronica [name] is a 60 y.o. year old female who is a primary care patient of Westley Chandler, MD. I reached out to [name] by phone today in response to a referral sent by Veronica [name]'s PCP.    Veronica [name] was given information about care management services today including:  Care management services include personalized support from designated clinical staff supervised by her physician, including individualized plan of care and coordination with other care providers 24/7 contact phone numbers for assistance for urgent and routine care needs. The patient may stop care management services at any time by phone call to the office staff.  Patient agreed to services and verbal consent obtained.   Follow up plan: Telephone appointment with care management team member scheduled for:02/02/21  [facility] Guide, Embedded Care Coordination [facility]  Care Management  Direct Dial: [phone]

## 2021-01-30 ENCOUNTER — Ambulatory Visit (INDEPENDENT_AMBULATORY_CARE_PROVIDER_SITE_OTHER): Payer: 59

## 2021-01-30 ENCOUNTER — Other Ambulatory Visit: Payer: Self-pay

## 2021-01-30 DIAGNOSIS — Z111 Encounter for screening for respiratory tuberculosis: Secondary | ICD-10-CM | POA: Diagnosis not present

## 2021-01-30 DIAGNOSIS — Z23 Encounter for immunization: Secondary | ICD-10-CM

## 2021-01-30 NOTE — Progress Notes (Signed)
Tuberculin skin test applied to left ventral forearm.  Patient to return on 10/5 for site reading at 4pm. Reminder card given.   Patient also received Prevnar 20.

## 2021-02-01 ENCOUNTER — Ambulatory Visit: Payer: 59

## 2021-02-02 ENCOUNTER — Other Ambulatory Visit: Payer: Self-pay

## 2021-02-02 ENCOUNTER — Ambulatory Visit (INDEPENDENT_AMBULATORY_CARE_PROVIDER_SITE_OTHER): Payer: 59

## 2021-02-02 ENCOUNTER — Ambulatory Visit: Payer: 59 | Admitting: Licensed Clinical Social Worker

## 2021-02-02 DIAGNOSIS — Z111 Encounter for screening for respiratory tuberculosis: Secondary | ICD-10-CM

## 2021-02-02 DIAGNOSIS — F4329 Adjustment disorder with other symptoms: Secondary | ICD-10-CM

## 2021-02-02 DIAGNOSIS — Z7189 Other specified counseling: Secondary | ICD-10-CM

## 2021-02-02 LAB — TB SKIN TEST
Induration: 0 mm
TB Skin Test: NEGATIVE

## 2021-02-02 NOTE — Patient Instructions (Signed)
Licensed Clinical Social Worker Visit Information  Goals we discussed today:   Goals Addressed             This Visit's Progress    Begin and Stick with Counseling-Depression       Timeframe:  Short-Term Goal Priority:  High Start Date: 02/02/2021                            Expected End Date:                       Follow Up Date 02/23/2021     Patient Goals/Self-Care Activities: Call [facility] [phone] or [facility] [phone]  to schedule your counseling appointment. Review your EMMI educational information on Relieving Stress and Movement: Emotion Health    Why is this important?   Beating depression may take some time.  If you don't feel better right away, don't give up on your treatment plan.            Ms. Woodham was given information about Care Management services today including:  Care Management services include personalized support from designated clinical staff supervised by her physician, including individualized plan of care and coordination with other care providers 24/7 contact phone numbers for assistance for urgent and routine care needs. The patient may stop Care Management services at any time by phone call to the office staff.  Patient agreed to services and verbal consent obtained.   Patient verbalizes understanding of instructions provided today and agrees to view in MyChart.   Follow up plan: Appointment scheduled for SW follow up with client by phone on: 02/23/2021   Sammuel Hines, LCSW Care Management & Coordination  [facility] / [facility] [employer]   [phone]

## 2021-02-02 NOTE — Progress Notes (Signed)
Patient is here for a PPD read.  It was placed on 01/30/2021 in the left forearm @ 1030 am.    PPD RESULTS:  Result: negative Induration: 0 mm  Letter created and given to patient for documentation purposes. Veronda Prude, RN

## 2021-02-02 NOTE — Chronic Care Management (AMB) (Signed)
Care Management Clinical Social Work Note  02/02/2021 Name: Veronica [name] MRN: [id number] DOB: May 20, 1960  Veronica [name] is a 60 y.o. year old female who is a primary care patient of Westley Chandler, MD.  The Care Management team was consulted for assistance with chronic disease management and coordination needs.  Engaged with patient by telephone for initial visit in response to provider referral for social work chronic care management and care coordination services  Consent to Services:  Ms. Brain was given information about Care Management services today including:  Care Management services includes personalized support from designated clinical staff supervised by her physician, including individualized plan of care and coordination with other care providers 24/7 contact phone numbers for assistance for urgent and routine care needs. The patient may stop case management services at any time by phone call to the office staff.  Patient agreed to services and consent obtained.   Assessment: .   Assessed needs, previous treatment, current coping skills, support system and barriers to care . Provided patient with therapy options and interventions, she declined assistance with making call to schedule the appointment. See Care Plan below for interventions and patient self-care actives.  Recommendation: Patient may benefit from, and is in agreement call agencies discussed today to make therapy appointment and review EMMI video for coping skills.   Follow up Plan: Patient would like continued follow-up from CCM LCSW .  per patient's request will follow up in 3 weeks.  Will call office if needed prior to next encounter.   Review of patient past medical history, allergies, medications, and health status, including review of relevant consultants reports was performed today as part of a comprehensive evaluation and provision of chronic care management and care coordination services.  SDOH  (Social Determinants of Health) assessments and interventions performed:  SDOH Interventions    Flowsheet Row Most Recent Value  SDOH Interventions   Stress Interventions Other (Comment), Provide Counseling  [referral]        Advanced Directives Status: Not addressed in this encounter.  Care Plan  Allergies  Allergen Reactions   Amlodipine Swelling    Leg edema   Tessalon [Benzonatate] Other (See Comments)    Lip swelling    Outpatient Encounter Medications as of 02/02/2021  Medication Sig   aspirin EC 81 MG tablet Take 1 tablet (81 mg total) by mouth daily. Swallow whole.   atorvastatin (LIPITOR) 10 MG tablet Take 1 tablet (10 mg total) by mouth daily.   busPIRone (BUSPAR) 5 MG tablet Take 1 tablet (5 mg total) by mouth 2 (two) times daily.   cetirizine (ZYRTEC) 10 MG tablet Take 1 tablet (10 mg total) by mouth daily as needed for allergies.   FLUoxetine (PROZAC) 20 MG capsule Take 1 capsule (20 mg total) by mouth daily.   hydroxychloroquine (PLAQUENIL) 200 MG tablet Take 400 mg by mouth at bedtime.    indapamide (LOZOL) 2.5 MG tablet Take 1 tablet (2.5 mg total) by mouth daily.   losartan (COZAAR) 100 MG tablet Take 1 tablet (100 mg total) by mouth daily.   omeprazole (PRILOSEC) 20 MG capsule Take 1 capsule (20 mg total) by mouth daily.   spironolactone (ALDACTONE) 25 MG tablet Take 1 tablet (25 mg total) by mouth daily.   topiramate (TOPAMAX) 25 MG tablet TAKE 1 TABLET BY MOUTH AT BEDTIME FOR 1 WEEK THEN INCREASE TO 2 TABLETS AT BEDTIME   No facility-administered encounter medications on file as of 02/02/2021.    Patient Active  Problem List   Diagnosis Date Noted   Esophageal dysphagia 04/15/2020   Mood disorder (HCC) 11/13/2019   Atherosclerosis of aorta (HCC) 07/15/2019   Prediabetes 07/13/2019   Chronic abdominal pain 07/13/2019   Insomnia 12/15/2012   Essential hypertension, benign 04/10/2012   SLE (systemic lupus erythematosus) (HCC) 04/10/2012    Conditions  to be addressed/monitored:  managing symptoms of mood ;   Care Plan : General Social Work (Adult)  Updates made by Soundra Pilon, LCSW since 02/02/2021 12:00 AM     Problem: Emotional Distress      Goal: Emotional Health Supported by connecting for ongoing Therapy   Start Date: 02/02/2021  This Visit's Progress: On track  Priority: High  Note:   Current Barriers:  Disease Management support and education needs related to Mood Instability  CSW Clinical Goal(s):  Patient  will contact providers discussed today and schedule therapy appointment.   Interventions: Inter-disciplinary care team collaboration (see longitudinal plan of care) Evaluation of current treatment plan related to  self management and patient's adherence to plan as established by provider  Mental Health:  (Status: New goal.) Evaluation of current treatment plan related to Mood Instability Solution-Focused Strategies employed: with locating a therapist Mindfulness or Relaxation training provided Provided EMMI education information on Relieving Stress and Movement: Emotional Health  Patient Goals/Self-Care Activities: Call [facility] [phone] or [facility] [phone]  to schedule your counseling appointment. Review your EMMI educational information on Relieving Stress and Movement: Emotion Health  Follow Up Plan: Telephone follow up appointment with care management team member scheduled for:02/23/2021      Sammuel Hines, LCSW Care Management & Coordination  [facility] / [employer]   [phone]

## 2021-02-21 ENCOUNTER — Other Ambulatory Visit: Payer: Self-pay

## 2021-02-21 DIAGNOSIS — F419 Anxiety disorder, unspecified: Secondary | ICD-10-CM

## 2021-02-21 MED ORDER — BUSPIRONE HCL 5 MG PO TABS: 5.0000 mg | ORAL_TABLET | Freq: Two times a day (BID) | ORAL | 2 refills | Status: DC

## 2021-02-23 ENCOUNTER — Ambulatory Visit: Payer: 59 | Admitting: Licensed Clinical Social Worker

## 2021-02-23 DIAGNOSIS — Z7189 Other specified counseling: Secondary | ICD-10-CM

## 2021-02-23 DIAGNOSIS — F39 Unspecified mood [affective] disorder: Secondary | ICD-10-CM

## 2021-02-23 NOTE — Patient Instructions (Signed)
Visit Information   Goals Addressed             This Visit's Progress    Begin and Stick with Counseling-Depression   On track    Timeframe:  Short-Term Goal Priority:  High Start Date: 02/02/2021                            Expected End Date:                       Follow Up Date 03/14/2021     Patient Goals/Self-Care Activities: Per your request I have placed a referral to [facility] [phone] they will contact you to schedule your counseling appointment. Continue to review your EMMI educational information on Relieving Stress and Movement: Emotion Health      It was a pleasure speaking with you today. Follow up appointment is scheduled 03/14/2021 Patient verbalizes understanding of instructions provided today and agrees to view in MyChart.  Sammuel Hines, LCSW Care Management & Coordination  [phone]

## 2021-02-23 NOTE — Chronic Care Management (AMB) (Signed)
Care Management  Clinical Social Work Note  02/23/2021 Name: ANJULI GEMMILL MRN: [id number] DOB: [birth year]  ALLIENE KLUGH is a 60 y.o. year old female who is a primary care patient of Westley Chandler, MD. The CCM team was consulted for assistance with care coordination needs.  Connect for ongoing counseling services.    Consent to Services:  The patient was given information about Care Management services, agreed to services, and gave verbal consent prior to initiation of services.  Please see initial visit note for detailed documentation.   Patient agreed to services today and consent obtained.  Engaged with patient by telephone for follow up visit in response to provider referral for social work care coordination services.   Assessment/Interventions: Assessed patient's current treatment, progress, coping skills, support system and barriers to care.  She has not been able to connect with counseling resources provided . Reminded patient of upcoming appointments  See Care Plan below for interventions and patient self-care actives.  Recent life changes or stressors: two deaths in her family. Reports she is doing ok.  Recommendation: Patient may benefit from, and is in agreement to allow LCSW to make referral to establish with a therapist. Patient would like [facility].   Follow up Plan: Patient would like continued follow-up from CCM LCSW .  per patient's request will follow up in 2 to 3 weeks.  Will call office if needed prior to next encounter.   Review of patient past medical history, allergies, medications, and health status, including review of pertinent consultant reports was performed as part of comprehensive evaluation and provision of care management/care coordination services.   SDOH (Social Determinants of Health) screening and interventions performed today:   Advanced Directives Status:Not addressed in this encounter.     Care Plan    Conditions to be  addressed/monitored per PCP order: symptoms of Mood disorder   Care Plan : General Social Work (Adult)  Updates made by Soundra Pilon, LCSW since 02/23/2021 12:00 AM     Problem: Emotional Distress      Goal: Emotional Health Supported by connecting for ongoing Therapy   Start Date: 02/02/2021  This Visit's Progress: On track  Recent Progress: On track  Priority: High  Note:   Current Barriers:  Patient has not been able to connect to counseling resources provided Disease Management support and education needs related to Mood Instability  CSW Clinical Goal(s):  Patient  will contact providers discussed today and schedule therapy appointment.   Interventions: Inter-disciplinary care team collaboration (see longitudinal plan of care) Evaluation of current treatment plan related to  self management and patient's adherence to plan as established by provider  Mental Health:  (Status: New goal. Goal on track: YES.) Evaluation of current treatment plan related to Mood Instability Solution-Focused Strategies employed: with locating a therapist  Patient Goals/Self-Care Activities: Per your request I have placed a referral to [facility] [phone] they will contact you to schedule your counseling appointment. Continue to review your EMMI educational information on Relieving Stress and Movement: Emotion Health    Sammuel Hines, LCSW Care Management & Coordination  [facility] / [employer]   [phone]

## 2021-02-27 ENCOUNTER — Other Ambulatory Visit: Payer: Self-pay | Admitting: *Deleted

## 2021-02-27 MED ORDER — FLUOXETINE HCL 20 MG PO CAPS: 20.0000 mg | ORAL_CAPSULE | Freq: Every day | ORAL | 2 refills | Status: DC

## 2021-03-03 ENCOUNTER — Encounter: Payer: Self-pay | Admitting: Family Medicine

## 2021-03-03 ENCOUNTER — Ambulatory Visit (INDEPENDENT_AMBULATORY_CARE_PROVIDER_SITE_OTHER): Payer: 59 | Admitting: Family Medicine

## 2021-03-03 ENCOUNTER — Other Ambulatory Visit: Payer: Self-pay

## 2021-03-03 VITALS — BP 134/76 | HR 64 | Wt 230.6 lb

## 2021-03-03 DIAGNOSIS — I10 Essential (primary) hypertension: Secondary | ICD-10-CM

## 2021-03-03 DIAGNOSIS — R109 Unspecified abdominal pain: Secondary | ICD-10-CM

## 2021-03-03 DIAGNOSIS — Z23 Encounter for immunization: Secondary | ICD-10-CM | POA: Diagnosis not present

## 2021-03-03 DIAGNOSIS — K5904 Chronic idiopathic constipation: Secondary | ICD-10-CM

## 2021-03-03 DIAGNOSIS — R7303 Prediabetes: Secondary | ICD-10-CM

## 2021-03-03 DIAGNOSIS — I700 Atherosclerosis of aorta: Secondary | ICD-10-CM

## 2021-03-03 DIAGNOSIS — Z6839 Body mass index (BMI) 39.0-39.9, adult: Secondary | ICD-10-CM

## 2021-03-03 DIAGNOSIS — R1319 Other dysphagia: Secondary | ICD-10-CM

## 2021-03-03 DIAGNOSIS — T50905A Adverse effect of unspecified drugs, medicaments and biological substances, initial encounter: Secondary | ICD-10-CM

## 2021-03-03 DIAGNOSIS — F39 Unspecified mood [affective] disorder: Secondary | ICD-10-CM

## 2021-03-03 DIAGNOSIS — R232 Flushing: Secondary | ICD-10-CM

## 2021-03-03 DIAGNOSIS — F419 Anxiety disorder, unspecified: Secondary | ICD-10-CM

## 2021-03-03 LAB — POCT GLYCOSYLATED HEMOGLOBIN (HGB A1C): HbA1c, POC (controlled diabetic range): 5.7 % (ref 0.0–7.0)

## 2021-03-03 MED ORDER — POLYETHYLENE GLYCOL 3350 17 GM/SCOOP PO POWD: 17.0000 g | Freq: Two times a day (BID) | ORAL | 1 refills | Status: DC | PRN

## 2021-03-03 MED ORDER — LOSARTAN POTASSIUM 100 MG PO TABS: 100.0000 mg | ORAL_TABLET | Freq: Every day | ORAL | 1 refills | Status: DC

## 2021-03-03 MED ORDER — KETOCONAZOLE 2 % EX CREA: 1.0000 "application " | TOPICAL_CREAM | Freq: Every day | CUTANEOUS | 3 refills | Status: DC

## 2021-03-03 MED ORDER — SPIRONOLACTONE 25 MG PO TABS: 25.0000 mg | ORAL_TABLET | Freq: Every day | ORAL | 2 refills | Status: DC

## 2021-03-03 MED ORDER — BUSPIRONE HCL 5 MG PO TABS
5.0000 mg | ORAL_TABLET | Freq: Two times a day (BID) | ORAL | 2 refills | Status: DC
Start: 2021-03-03 — End: 2021-09-01

## 2021-03-03 MED ORDER — OMEPRAZOLE 20 MG PO CPDR: 20.0000 mg | DELAYED_RELEASE_CAPSULE | Freq: Every day | ORAL | 3 refills | Status: DC

## 2021-03-03 MED ORDER — ATORVASTATIN CALCIUM 10 MG PO TABS: 10.0000 mg | ORAL_TABLET | Freq: Every day | ORAL | 3 refills | Status: DC

## 2021-03-03 MED ORDER — INDAPAMIDE 2.5 MG PO TABS: 2.5000 mg | ORAL_TABLET | Freq: Every day | ORAL | 2 refills | Status: DC

## 2021-03-03 MED ORDER — SEMAGLUTIDE-WEIGHT MANAGEMENT 0.25 MG/0.5ML ~~LOC~~ SOAJ: 0.2500 mg | SUBCUTANEOUS | 0 refills | Status: AC

## 2021-03-03 NOTE — Patient Instructions (Signed)
It was wonderful to see you today.  Please bring ALL of your medications with you to every visit.   Today we talked about:  --Going to the lab   - For your hot flashes--we will check some blood work--we may switch your Prozac to another medication  - I sent in Wegovy--let me know th cost    Follow up in 1 month to adjust Wegovy dose   Thank you for choosing [facility].   Please call [phone] with any questions about today's appointment.  Please be sure to schedule follow up at the front  desk before you leave today.   [name], MD  Family Medicine

## 2021-03-03 NOTE — Assessment & Plan Note (Signed)
Improved and at goal.  Refill medications today.  Metabolic panel was unremarkable in the summer.  Repeat in winter 2023.

## 2021-03-03 NOTE — Assessment & Plan Note (Addendum)
Repeat A1C today.  Congratulated on her dietary changes and weight change.  She reports to me today she would like to lose 100 pounds.  We decided to set a more reasonable goal.  Discussed fully COVID.  We discussed that her insurance may not cover this prescription sent to pharmacy to check the cost.  Discussed side effects contraindications.  There is no family history of thyroid medullary carcinoma or personal history of pancreatitis.  Discussed this possible side effect.

## 2021-03-03 NOTE — Assessment & Plan Note (Signed)
GustWill evaluate for secondary causes of hot flashes with CBC and TSH today.  There are no red flag symptoms.  She has not had a new sexual partner and has had STI testing in the past.  Patient from Prozac to possible venlafaxine or Cymbalta.  Will obtain test and message patient to determine next best steps.

## 2021-03-03 NOTE — Progress Notes (Signed)
SUBJECTIVE:   CHIEF COMPLAINT: follow up HPI:   Veronica [name] is a 60 y.o. yo with history notable for prediabetes and adjustment disorder presenting for follow up.  The patient reports in terms of her mood she is doing okay.  Work is less stressful.  She reports home is similar to prior.  The patient's weight today is 230.  Previously this was 227 pounds.  She is due for her A1c today.  She denies signs or symptoms of diabetes.  The patient reports her flank pain is resolved.  She thinks this was from her diverticulitis.  She is interested in therapy for constipation.  She is up-to-date on her colonoscopy. No more severe pain, melena, hematochezia.   The patient reports 1 her main concerns is hot flashes.  At night she feels like she soaks the sheets.  She has associated vaginal dryness and despite conservative measures and fans and trying to wear cotton at all times she feels very hot at night.  She is interested in alternative medication.  She finds some benefit to her Prozac but would be interested in transitioning to another medication that could help with both her hot flashes and her mood symptoms.  The patient reports she recently got her shingles shot and she notes some erythema on her arm.  The patient reports that the rash on her back improved with application of the topical therapy from last time.  PERTINENT  PMH / PSH/Family/Social History : history of SLE, follows with Rheum, updated problem list  OBJECTIVE:   BP 134/76   Pulse 64   Wt 230 lb 9.6 oz (104.6 kg)   SpO2 100%   BMI 39.58 kg/m   Today's weight:  Last Weight  Most recent update: 03/03/2021 10:49 AM    Weight  104.6 kg (230 lb 9.6 oz)            Review of prior weights: Filed Weights   03/03/21 1049  Weight: 230 lb 9.6 oz (104.6 kg)     Cardiac: Regular rate and rhythm. Normal S1/S2. No murmurs, rubs, or gallops appreciated. Lungs: Clear bilaterally to ascultation.  Abdomen: Normoactive  bowel sounds. No tenderness to deep or light palpation. No rebound or guarding.  No masses appreciated. Psych: Pleasant and appropriate   Hypo and hyperpigmented macules on back.  On her right arm she has a perfectly circular area of erythema without warmth or induration suggestive of a recent vaccine administration. ASSESSMENT/PLAN:   Prediabetes Repeat A1C today.  Congratulated on her dietary changes and weight change.  She reports to me today she would like to lose 100 pounds.  We decided to set a more reasonable goal.  Discussed fully COVID.  We discussed that her insurance may not cover this prescription sent to pharmacy to check the cost.  Discussed side effects contraindications.  There is no family history of thyroid medullary carcinoma or personal history of pancreatitis.  Discussed this possible side effect.  Essential hypertension, benign Improved and at goal.  Refill medications today.  Metabolic panel was unremarkable in the summer.  Repeat in winter 2023.   Intermittent constipation, discussed Miralax. Rx to pharmacy. Discussed reasons to return--follows with GI, has known diverticular disease.   HTN Medications refilled today  HCM Pause on other vaccines--she is amenable to COVID at follow up Follow up at 1 month for titrating [facility]  Lipids at follow up     [name], MD  [facility] Medicine Teaching Service  [facility]

## 2021-03-04 LAB — CBC
Hematocrit: 37.1 % (ref 34.0–46.6)
Hemoglobin: 11.9 g/dL (ref 11.1–15.9)
MCH: 27.2 pg (ref 26.6–33.0)
MCHC: 32.1 g/dL (ref 31.5–35.7)
MCV: 85 fL (ref 79–97)
Platelets: 322 10*3/uL (ref 150–450)
RBC: 4.38 x10E6/uL (ref 3.77–5.28)
RDW: 11.8 % (ref 11.7–15.4)
WBC: 4.6 10*3/uL (ref 3.4–10.8)

## 2021-03-04 LAB — TSH: TSH: 0.86 u[IU]/mL (ref 0.450–4.500)

## 2021-03-14 ENCOUNTER — Telehealth: Payer: 59

## 2021-03-14 ENCOUNTER — Telehealth: Payer: Self-pay | Admitting: Licensed Clinical Social Worker

## 2021-03-14 NOTE — Chronic Care Management (AMB) (Signed)
Clinical Social Work  Care Management   Phone Outreach    03/14/2021 Name: Veronica [name] MRN: [id number] DOB: 1961-02-07  Veronica [name] is a 59 y.o. year old female who is a primary care patient of Westley Chandler, MD .   Reason for referral: Mental Health Counseling and Resources.    F/U phone call today to assess needs, progress and barriers with care plan goals.   Telephone outreach was unsuccessful. A HIPPA compliant phone message was left for the patient providing contact information and requesting a return call.   Plan:CCM LCSW will wait for return call. If no return call is received, Will route chart to Care Guide to see if patient would like to reschedule phone appointment   Review of patient status, including review of consultants reports, relevant laboratory and other test results, and collaboration with appropriate care team members and the patient's provider was performed as part of comprehensive patient evaluation and provision of care management services.    Sammuel Hines, LCSW Care Management & Coordination  [facility] / [employer]   [phone]

## 2021-03-17 ENCOUNTER — Encounter: Payer: Self-pay | Admitting: Family Medicine

## 2021-03-21 ENCOUNTER — Ambulatory Visit: Payer: 59 | Admitting: Licensed Clinical Social Worker

## 2021-03-21 DIAGNOSIS — Z7189 Other specified counseling: Secondary | ICD-10-CM

## 2021-03-21 DIAGNOSIS — F39 Unspecified mood [affective] disorder: Secondary | ICD-10-CM

## 2021-03-21 NOTE — Patient Instructions (Signed)
Licensed Clinical Social Worker Visit Information  Goals we discussed today:   Goals Addressed             This Visit's Progress    Begin and Stick with Counseling-Depression   On track    Timeframe:  Short-Term Goal Priority:  High Start Date: 02/02/2021                            Expected End Date:                        Patient Goals/Self-Care Activities: Continue to review your EMMI educational information on Relieving Stress and Movement: Emotion Health Keep therapy appointment with [facility] 04/04/21   [phone]          Materials provided: Yes: see above  Patient agreed to services and verbal consent obtained.   Patient verbalizes understanding of instructions provided today and agrees to view in MyChart.   It was a pleasure speaking with you today. Please call the office if needed No follow up scheduled, per our conversation you do not require or desire continued follow up Per our conversation I will remain part of your care team for the next 21 days.  If no needs are identified in the next 21 days, I will disconnect from the care team.  Sammuel Hines, LCSW Care Management & Coordination  [phone]

## 2021-03-21 NOTE — Chronic Care Management (AMB) (Signed)
Care Management  Clinical Social Work Note  03/21/2021 Name: Veronica [name] MRN: [id number] DOB: 1961/02/06  Veronica [name] is a 60 y.o. year old female who is a primary care patient of Veronica Chandler, MD. The CCM team was consulted for assistance with care coordination needs.  Connect for therapy     Consent to Services:  The patient was given information about Care Management services, agreed to services, and gave verbal consent prior to initiation of services.  Please see initial visit note for detailed documentation.   Patient agreed to services today and consent obtained.  Engaged with patient by telephone for follow up visit in response to provider referral for social work care coordination services.   Assessment/Interventions: Assessed patient's current treatment, progress, coping skills, support system and barriers to care.  Collaborated with Veronica [name] in order to meet patient's ongoing needs. Patient is making progress with connect for therapy, has an appointment 12/6 with [facility] .  See Care Plan below for interventions and patient self-care actives.  Follow up Plan:  Patient does not require or desire continued follow-up by CCM LCSW. Will contact the office if needed Patient may benefit from and is in agreement for CCM LCSW to remain part of care team for the next 21 days.  If no needs are identified in the next 21 days, CCM LSCW will disconnect from the care team.   Review of patient past medical history, allergies, medications, and [name] status, including review of pertinent consultant reports was performed as part of comprehensive evaluation and provision of care management/care coordination services.   SDOH (Social Determinants of [name]) screening and interventions performed today:   Advanced Directives Status:Not ready or willing to discuss     Care Plan    Conditions to be addressed/monitored per PCP order: Depression,   Care Plan  : General Social Work (Adult)  Updates made by Veronica Pilon, LCSW since 03/21/2021 12:00 AM     Problem: Emotional Distress      Goal: Emotional [name] Supported by connecting for ongoing Therapy   Start Date: 02/02/2021  Expected End Date: 04/04/2021  This Visit's Progress: On track  Recent Progress: On track  Priority: High  Note:   Current Barriers:  Patient has not been able to connect to counseling resources provided Disease Management support and education needs related to Mood Instability  CSW Clinical Goal(s):  Patient  will contact providers discussed today and schedule therapy appointment.   Interventions: Inter-disciplinary care team collaboration (see longitudinal plan of care) Evaluation of current treatment plan related to  self management and patient's adherence to plan as established by provider  Mental [name]:  (Status: New goal. Goal on track: YES.) Evaluation of current treatment plan related to Mood Instability Solution-Focused Strategies employed: with locating a therapist Collaborate with [name]  Patient Goals/Self-Care Activities: Keep therapy appointment with Veronica [name] 04/04/21   [phone]  Continue to review your EMMI educational information on Relieving Stress and Movement: Emotion [name]     Veronica Hines, LCSW Care Management & Coordination  Veronica [name]   [phone]

## 2021-04-04 ENCOUNTER — Other Ambulatory Visit: Payer: Self-pay

## 2021-04-04 ENCOUNTER — Ambulatory Visit (INDEPENDENT_AMBULATORY_CARE_PROVIDER_SITE_OTHER): Payer: 59 | Admitting: Psychologist

## 2021-04-04 DIAGNOSIS — F321 Major depressive disorder, single episode, moderate: Secondary | ICD-10-CM | POA: Diagnosis not present
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
## 2021-04-04 NOTE — Plan of Care (Signed)
Symptoms Depressed or irritable mood Decrease or loss of appetite Diminished interest Sleeplessness Lack of energy  Poor concentration Irritability Social withdrawal Suicidal thoughts or gestures Feelings of hopelessness, worthlessness Low self-esteem    Goals Alleviate depressive symptoms Recognize, accept, and cope with depressive feelings Develop healthy thinking patterns Develop healthy interpersonal relationships  Objectives Cooperate with a medication evaluation by a physician Verbalize an accurate understanding of depression Verbalize an understanding of the treatment Identify and replace thoughts that support depression Learn and implement behavioral strategies Verbalize an understanding and resolution of current interpersonal problems Learn and implement problem solving and decision making skills Learn and implement conflict resolution skills to resolve interpersonal problems Verbalize an understanding of healthy and unhealthy emotions verbalize insight into how past relationships may be influence current experiences with depression Use mindfulness and acceptance strategies and increase value based behavior  Increase hopeful statements about the future.  Interventions Consistent with treatment model, discuss how change in cognitive, behavioral, and interpersonal can help client alleviate depression CBT Behavioral activation help the client explore the relationship, nature of the dispute,  Help the client develop new interpersonal skills and relationships Conduct Problem so living therapy Teach conflict resolution skills Use a process-experiential approach Conduct TLDP Conduct ACT Evaluate need for psychotropic medication Monitor adherence to medication

## 2021-04-07 ENCOUNTER — Other Ambulatory Visit: Payer: Self-pay

## 2021-04-07 ENCOUNTER — Ambulatory Visit (INDEPENDENT_AMBULATORY_CARE_PROVIDER_SITE_OTHER): Payer: 59 | Admitting: Family Medicine

## 2021-04-07 ENCOUNTER — Encounter: Payer: Self-pay | Admitting: Family Medicine

## 2021-04-07 VITALS — BP 119/75 | HR 76 | Ht 64.0 in | Wt 230.8 lb

## 2021-04-07 DIAGNOSIS — I700 Atherosclerosis of aorta: Secondary | ICD-10-CM

## 2021-04-07 DIAGNOSIS — R7303 Prediabetes: Secondary | ICD-10-CM

## 2021-04-07 DIAGNOSIS — I10 Essential (primary) hypertension: Secondary | ICD-10-CM

## 2021-04-07 DIAGNOSIS — R058 Other specified cough: Secondary | ICD-10-CM

## 2021-04-07 DIAGNOSIS — M329 Systemic lupus erythematosus, unspecified: Secondary | ICD-10-CM

## 2021-04-07 DIAGNOSIS — Z23 Encounter for immunization: Secondary | ICD-10-CM | POA: Diagnosis not present

## 2021-04-07 MED ORDER — WEGOVY 0.25 MG/0.5ML ~~LOC~~ SOAJ: 0.2500 mg | SUBCUTANEOUS | 0 refills | Status: DC

## 2021-04-07 MED ORDER — CETIRIZINE HCL 10 MG PO TABS: 10.0000 mg | ORAL_TABLET | Freq: Every day | ORAL | 3 refills | Status: DC | PRN

## 2021-04-07 MED ORDER — BD PEN NEEDLE NANO U/F 32G X 4 MM MISC: 1 refills | Status: DC

## 2021-04-07 NOTE — Progress Notes (Signed)
    SUBJECTIVE:   CHIEF COMPLAINT: weight and mood check  HPI:   Veronica [name] is a 60 y.o. yo with history notable for mood disorder, obesity and lupus presenting for several concerns  She reports a number of stressors at work.  She reports that her clients are  Acting up.  She reports her anxiety is quite high.  She is not particularly interested in switching her fluoxetine to venlafaxine at this time.  She is interested in having ongoing counseling but does not feel she connect with her current counselor.  Patient is interested in weight loss.  She is trying dietary changes but finds stressors at work prevent her from doing this.  She was prescribed Wegovy at her last visit but does not check the cost of this yet.  She is interested in a medication potentially called go low.  The patient reports intermittent headaches.  She has not had an eye exam in about a year.  No nausea, vomiting, chest pain, fevers.  She is followed by neurology for this condition and is on Topamax.  She reports these are similar to prior headaches and they mostly occur at the end of the day.  PERTINENT  PMH / PSH/Family/Social History : Updated and reviewed as appropriate  OBJECTIVE:   BP 119/75   Pulse 76   Ht 5\' 4"  (1.626 m)   Wt 230 lb 12.8 oz (104.7 kg)   BMI 39.62 kg/m   Today's weight:  Last Weight  Most recent update: 04/07/2021  9:47 AM    Weight  104.7 kg (230 lb 12.8 oz)            Review of prior weights: Filed Weights   04/07/21 0947  Weight: 230 lb 12.8 oz (104.7 kg)    Well-appearing woman in no distress.  HEENT exam is unremarkable.  Regular rate and rhythm no murmurs rubs or gallops.  Lungs clear bilaterally to auscultation.  Neurologic exam she is alert oriented to person place and date.  Normal gait and balance.  Abdomen is soft nontender nondistended.  ASSESSMENT/PLAN:   Atherosclerosis of aorta (HCC) Detected on imaging.  Lipid panel today.  Essential hypertension,  benign At goal.  Metabolic panel today.  SLE (systemic lupus erythematosus) (HCC) Recommended ophthalmology evaluation for intermittent headaches.  Reviewed return precautions.  Headache type is most consistent with tension type.  May be related to change in vision.  Will check glucose level today metabolic panel.   Obesity discussed dietary changes at length.  Congratulated on her lifestyle changes she has made.  She has a number of stressors at home.  Discussed starting Wegovy.  This most medication was prescribed again.  We discussed side effects at length.  There is no personal family history of medullary thyroid cancer.  Mood disorder has found benefit from fluoxetine we will continue this medication at this time.  Consider transition to venlafaxine in the future given her hot flashes.  Will reach out to Veronica [name] to see if we can transfer to a different counselor.    06-08-1990, MD  Family Medicine Teaching Service  [facility]

## 2021-04-07 NOTE — Patient Instructions (Addendum)
It was wonderful to see you today.  Please bring ALL of your medications with you to every visit.   Today we talked about:  -- I will ask Gavin Pound about finding you a new counselor  -- I will look into Go Low  --See how much Reginal Lutes is at your pharmacy  --We will get blood work today--I will send results via mychart    Thank you for choosing [facility].   Please call [phone] with any questions about today's appointment.  Please be sure to schedule follow up at the front  desk before you leave today.   [name], MD  Family Medicine

## 2021-04-07 NOTE — Assessment & Plan Note (Signed)
At goal.  Metabolic panel today.

## 2021-04-07 NOTE — Assessment & Plan Note (Signed)
Recommended ophthalmology evaluation for intermittent headaches.  Reviewed return precautions.  Headache type is most consistent with tension type.  May be related to change in vision.  Will check glucose level today metabolic panel.

## 2021-04-07 NOTE — Assessment & Plan Note (Signed)
Detected on imaging.  Lipid panel today.

## 2021-04-08 LAB — LIPID PANEL
Chol/HDL Ratio: 2.8 ratio (ref 0.0–4.4)
Cholesterol, Total: 143 mg/dL (ref 100–199)
HDL: 52 mg/dL (ref >39)
LDL Chol Calc (NIH): 81 mg/dL (ref 0–99)
Triglycerides: 42 mg/dL (ref 0–149)
VLDL Cholesterol Cal: 10 mg/dL (ref 5–40)

## 2021-04-08 LAB — BASIC METABOLIC PANEL
BUN/Creatinine Ratio: 14 (ref 9–23)
BUN: 15 mg/dL (ref 6–24)
CO2: 25 mmol/L (ref 20–29)
Calcium: 9.8 mg/dL (ref 8.7–10.2)
Chloride: 100 mmol/L (ref 96–106)
Creatinine, Ser: 1.08 mg/dL — ABNORMAL HIGH (ref 0.57–1.00)
Glucose: 108 mg/dL — ABNORMAL HIGH (ref 70–99)
Potassium: 3.9 mmol/L (ref 3.5–5.2)
Sodium: 138 mmol/L (ref 134–144)
eGFR: 60 mL/min/{1.73_m2} (ref >59)

## 2021-04-10 ENCOUNTER — Ambulatory Visit: Payer: 59 | Admitting: Licensed Clinical Social Worker

## 2021-04-10 DIAGNOSIS — F39 Unspecified mood [affective] disorder: Secondary | ICD-10-CM

## 2021-04-10 DIAGNOSIS — Z7689 Persons encountering health services in other specified circumstances: Secondary | ICD-10-CM

## 2021-04-10 NOTE — Chronic Care Management (AMB) (Signed)
Care Management  Clinical Social Work Note  04/10/2021 Name: Veronica [name] MRN: [id number] DOB: 06/27/1960  Veronica [name] is a 60 y.o. year old female who is a primary care patient of Westley Chandler, MD. The CCM team was consulted for assistance with care coordination needs. Mental Health Counseling and Resources   Consent to Services:  The patient was given information about Care Management services, agreed to services, and gave verbal consent prior to initiation of services.  Please see initial visit note for detailed documentation.   Patient agreed to services today and consent obtained.  Engaged with patient by telephone for follow up visit in response to provider referral for social work care coordination services. Assessed patient's current treatment, progress, coping skills, support system and barriers to care. .   Assessment includes: Review of patient past medical history, allergies, medications, and health status, including review of pertinent consultant reports was performed as part of comprehensive evaluation and provision of care management/care coordination services. See Care Plan below for interventions and patient self-care actives.   SDOH (Social Determinants of Health) screening and interventions performed today:   Advanced Directives Status:Not addressed in this encounter.     Care Plan    Conditions to be addressed/monitored per PCP order:  mood disorder ,   Care Plan : General Social Work (Adult)  Updates made by Soundra Pilon, LCSW since 04/10/2021 12:00 AM     Problem: Emotional Distress      Goal: Emotional Health Supported by connecting for ongoing Therapy   Start Date: 02/02/2021  Expected End Date: 04/29/2021  This Visit's Progress: On track  Recent Progress: On track  Priority: High  Note:   Current Barriers:  Patient has not been able to connect to counseling resources provided Disease Management support and education needs related to Mood  Instability  CSW Clinical Goal(s):  Patient  will contact providers discussed today and schedule therapy appointment.   Interventions: Inter-disciplinary care team collaboration (see longitudinal plan of care) Evaluation of current treatment plan related to  self management and patient's adherence to plan as established by provider  Mental Health:  (Status: Goal on Track (progressing): YES.)  had first session with [facility] not sure it's a good fit with therapist will go to next appointment in Jan. Evaluation of current treatment plan related to Mood Instability Solution-Focused Strategies employed: with developing a therapeutic relationship with therapist Problem Solving /Task Center strategies reviewed Participation in counseling encouraged  Discussed therapy options and how things went with first session at [facility]  [phone] to explore options  Patient Goals/Self-Care Activities: Keep therapy appointment with [facility]  [phone]  Continue to review your EMMI educational information on Relieving Stress and Movement: Emotion Health Call [facility]  [phone] and visit website      Follow up Plan:  Patient does not require or desire continued follow-up by CCM LCSW. Will contact the office if needed CCM LCSW will disconnect from patient's care team at this time, but will be available at any time patient would like to re-engage for care coordination needs.   Sammuel Hines, LCSW Care Management & Coordination  [facility] / [employer]   [phone]

## 2021-04-10 NOTE — Patient Instructions (Signed)
Visit Information  Thank you for taking time to visit with me today. Please don't hesitate to contact me if I can be of assistance to you before our next scheduled telephone appointment.  Following are the goals we discussed today: managing mood and ongoing therapy Patient Self-Care Activities: Keep therapy appointment with [facility]  [phone]  Continue to review your EMMI educational information on Relieving Stress and Movement: Emotion Health Call [facility]  [phone] and visit website    Please call the care guide team at [phone] if you need to cancel or reschedule your appointment.   If you are experiencing a Mental Health or Behavioral Health Crisis or need someone to talk to, please call the Suicide and Crisis Lifeline: 988 call the [place] National Suicide Prevention Lifeline: (575) 430-2301 or TTY: 650-660-0144 TTY 857 659 7054) to talk to a trained counselor call 1-800-273-TALK (toll free, 24 hour hotline) go to [facility] [address] [place] [phone]) call 911   Patient verbalizes understanding of instructions provided today and agrees to view in MyChart.   Please call the office if needed No follow up scheduled, per our conversation you do not require or desire continued follow up I will disconnect from your care team at this time, please call the office if additional needs are identified   Sammuel Hines, [facility] Care Management & Coordination  [phone]

## 2021-04-25 ENCOUNTER — Other Ambulatory Visit: Payer: Self-pay

## 2021-04-26 MED ORDER — TOPIRAMATE 25 MG PO TABS: ORAL_TABLET | ORAL | 2 refills | Status: DC

## 2021-05-09 ENCOUNTER — Ambulatory Visit: Payer: Self-pay | Admitting: Psychologist

## 2021-05-12 ENCOUNTER — Ambulatory Visit: Payer: 59 | Admitting: Family Medicine

## 2021-05-20 ENCOUNTER — Encounter: Payer: Self-pay | Admitting: Family Medicine

## 2021-05-22 ENCOUNTER — Encounter: Payer: Self-pay | Admitting: Family Medicine

## 2021-05-22 ENCOUNTER — Other Ambulatory Visit (HOSPITAL_COMMUNITY): Payer: Self-pay

## 2021-05-22 ENCOUNTER — Ambulatory Visit (INDEPENDENT_AMBULATORY_CARE_PROVIDER_SITE_OTHER): Payer: 59

## 2021-05-22 ENCOUNTER — Ambulatory Visit (INDEPENDENT_AMBULATORY_CARE_PROVIDER_SITE_OTHER): Payer: 59 | Admitting: Family Medicine

## 2021-05-22 ENCOUNTER — Other Ambulatory Visit: Payer: Self-pay

## 2021-05-22 DIAGNOSIS — I10 Essential (primary) hypertension: Secondary | ICD-10-CM | POA: Diagnosis not present

## 2021-05-22 DIAGNOSIS — F39 Unspecified mood [affective] disorder: Secondary | ICD-10-CM | POA: Diagnosis not present

## 2021-05-22 DIAGNOSIS — Z23 Encounter for immunization: Secondary | ICD-10-CM

## 2021-05-22 MED ORDER — WEGOVY 0.25 MG/0.5ML ~~LOC~~ SOAJ
0.2500 mg | SUBCUTANEOUS | 0 refills | Status: DC
  Filled 2021-05-22 (×2): qty 2, 28d supply, fill #0

## 2021-05-22 NOTE — Assessment & Plan Note (Signed)
Supportive listening, continue current therapy, messaged Ms. Ashe about other options for counseling

## 2021-05-22 NOTE — Patient Instructions (Addendum)
It was wonderful to see you today.  Please bring ALL of your medications with you to every visit.   Today we talked about:  - I will message our care management team about a counselor  --- I called your pharmacy about Wegovy--they do not yet have this medication in stock--our [facility] have reported to have this in stock--I [facility] to [facility]   [address], [place], [place] [address]  Follow up in 4 weeks after you start the [facility]   You can try below for hot flashes  Other things to try ( you may already do most of these) - Fan in the room - Wearing layers  - Drink plenty of water        Thank you for choosing [facility].   Please call [phone] with any questions about today's appointment.  Please be sure to schedule follow up at the front  desk before you leave today.   Terisa Starrarina Aleister Lady, MD  Family Medicine

## 2021-05-22 NOTE — Progress Notes (Signed)
° ° °  SUBJECTIVE:   CHIEF COMPLAINT: hot flashes, medications  HPI:   HARLEM THRESHER is a 61 y.o.  with history notable for prediabetes, HTN, and obesity  presenting for follow up. She had a significant stressor at her job--client acting up. She took ~ 7 days off of work. Still feeling anxious slightly. Also has home stressors. She is interested in a new therapist. Finds benefit from  SSRI therapy.   The patient reports ongoing hot flashes. Has tried lifestyle changes. Wonders if she needs birth control pills. Currently menopausal, no bleeding. Her primary symptom is hot flashes. 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   The patient is interested in [place]. Her pharmacy was out of the medication, called again today, still out of starting dose. She has tried multiple interventions to help with her weight in the past.    PERTINENT  PMH / PSH/Family/Social History : updated and noted   OBJECTIVE:   BP 138/66    Pulse 80    Wt 230 lb 3.2 oz (104.4 kg)    SpO2 100%    BMI 39.51 kg/m   Today's weight:  Last Weight  Most recent update: 05/22/2021 10:17 AM    Weight  104.4 kg (230 lb 3.2 oz)            Review of prior weights: Filed Weights   05/22/21 1016  Weight: 230 lb 3.2 oz (104.4 kg)     Cardiac: Regular rate and rhythm. Normal S1/S2. No murmurs, rubs, or gallops appreciated. Lungs: Clear bilaterally to ascultation.  Psych: Pleasant and appropriate    ASSESSMENT/PLAN:   Essential hypertension, benign At goal   Mood disorder [facility]) Supportive listening, continue current therapy, messaged Ms. Ashe about other options for counseling    HCM Covid vaccine given today   Hot flashes, discussed benefits, risks of HRT. Recommended phytoestrogen therapy   Obesity, with prediabetes, discussed options, interested in [place], [facility]  Acute stress reaction- as above- continue therapy, given anxiety  did not transition to SNRI   At follow up, consider discussion of SLE/going back to Rheum     [name], MD  Family Medicine Teaching Service  [facility]

## 2021-05-22 NOTE — Assessment & Plan Note (Signed)
At goal.  

## 2021-05-23 ENCOUNTER — Other Ambulatory Visit (HOSPITAL_COMMUNITY): Payer: Self-pay

## 2021-05-23 NOTE — Addendum Note (Signed)
Addended by: Manson Passey, Korry Dalgleish on: 05/23/2021 09:16 AM   Modules accepted: Orders

## 2021-05-25 ENCOUNTER — Telehealth: Payer: Self-pay | Admitting: *Deleted

## 2021-05-25 NOTE — Chronic Care Management (AMB) (Signed)
Care Management   Outreach Note  05/25/2021 Name: Veronica [name] MRN: [id number] DOB: 02-Dec-1960  Referred by: Westley Chandler, MD Reason for referral : Care Coordination (Initial outreach to schedule referral with BSW)   An unsuccessful telephone outreach was attempted today. The patient was referred to the case management team for assistance with care management and care coordination.   Follow Up Plan:  A HIPAA compliant phone message was left for the patient providing contact information and requesting a return call.  The care management team will reach out to the patient again over the next 7 days. If patient returns call to provider office, please advise to call Embedded Care Management Care Guide Misty Stanley at [phone].  Gwenevere Ghazi  Care Guide, Embedded Care Coordination [facility] Management  Direct Dial: [phone]

## 2021-05-29 NOTE — Chronic Care Management (AMB) (Signed)
Care Management   Outreach Note  05/29/2021 Name: Veronica [name] MRN: [id number] DOB: 1960-10-28  Referred by: Westley Chandler, MD Reason for referral : Care Coordination (Initial outreach to schedule referral with BSW)   A second unsuccessful telephone outreach was attempted today. The patient was referred to the case management team for assistance with care management and care coordination.   Follow Up Plan:  A HIPAA compliant phone message was left for the patient providing contact information and requesting a return call.  The care management team will reach out to the patient again over the next 7 days.  If patient returns call to provider office, please advise to call Embedded Care Management Care Guide Misty Stanley * at [phone]  Care Guide, Embedded Care Coordination [facility]   Care Management  Direct Dial: [phone]

## 2021-05-30 NOTE — Chronic Care Management (AMB) (Signed)
Care Management   Note  05/30/2021 Name: Veronica [name] MRN: [id number] DOB: 08-15-1960  Veronica [name] is a 61 y.o. year old female who is a primary care patient of Westley Chandler, MD. I reached out to [name] by phone today in response to a referral sent by [name]'s primary care provider.   [name] was given information about care management services today including:  Care management services include personalized support from designated clinical staff supervised by her physician, including individualized plan of care and coordination with other care providers 24/7 contact phone numbers for assistance for urgent and routine care needs. The patient may stop care management services at any time by phone call to the office staff.  Patient agreed to services and verbal consent obtained.   Follow up plan: Telephone appointment with care management team member scheduled for:06/14/21  [facility] Guide, Embedded Care Coordination [facility]   Care Management  Direct Dial: [phone]

## 2021-06-14 ENCOUNTER — Telehealth: Payer: Self-pay | Admitting: Licensed Clinical Social Worker

## 2021-06-14 ENCOUNTER — Telehealth: Payer: Medicaid Other

## 2021-06-14 NOTE — Telephone Encounter (Signed)
Care Management   Follow Up Note   06/14/2021 Name: Veronica [name] MRN: [id number] DOB: 06/17/1960   Referred by: Westley Chandler, MD Reason for referral : Care Coordination (BSW 1st attempt)   An unsuccessful telephone outreach was attempted today. The patient was referred to the case management team for assistance with care management and care coordination.   Follow Up Plan: The care management team will reach out to the patient again over the next 30 days.   Christen Butter, BSW  Social Worker [facility] Care Management  [phone]

## 2021-06-19 ENCOUNTER — Other Ambulatory Visit: Payer: Self-pay

## 2021-06-19 ENCOUNTER — Telehealth: Payer: Self-pay

## 2021-06-19 ENCOUNTER — Encounter: Payer: Self-pay | Admitting: Family Medicine

## 2021-06-19 ENCOUNTER — Ambulatory Visit (INDEPENDENT_AMBULATORY_CARE_PROVIDER_SITE_OTHER): Payer: 59 | Admitting: Family Medicine

## 2021-06-19 VITALS — BP 131/73 | HR 66 | Ht 64.0 in | Wt 231.4 lb

## 2021-06-19 DIAGNOSIS — M25511 Pain in right shoulder: Secondary | ICD-10-CM | POA: Diagnosis not present

## 2021-06-19 DIAGNOSIS — R109 Unspecified abdominal pain: Secondary | ICD-10-CM

## 2021-06-19 DIAGNOSIS — G8929 Other chronic pain: Secondary | ICD-10-CM | POA: Diagnosis not present

## 2021-06-19 DIAGNOSIS — Z0000 Encounter for general adult medical examination without abnormal findings: Secondary | ICD-10-CM | POA: Diagnosis not present

## 2021-06-19 MED ORDER — DICLOFENAC SODIUM 1 % EX GEL: 4.0000 g | Freq: Four times a day (QID) | CUTANEOUS | 1 refills | Status: DC

## 2021-06-19 NOTE — Patient Instructions (Signed)
It was wonderful to see you today.  Please bring ALL of your medications with you to every visit.   Today we talked about:  **   For your stomach--- take Prilosec every day for 4 weeks  Let me know you how your stomach feels  Then you can transition to over the counter Famotidine  If your symptoms worsen, let me know and we will get you to gastroetnerology   An x-ray was ordered for you---you do not need an appointment to have this completed.  I [facility] [address] [place] [address] [place]   If the results are normal,I will send you a letter  I will call you with results if anything is abnormal    For your shoulder--Voltaren gel 3 times per day  I have referred you to Sports Medicine to further evaluate your concern of shoulder pain. If you do not received a phone call about this appointment within 2 weeks, please call our office back at [phone]. Jazmin Hartsell coordinates our referrals and can assist you in this.   For your jaw---I recommend a bite guard   Thank you for choosing [facility].   Please call [phone] with any questions about today's appointment.  Please be sure to schedule follow up at the front  desk before you leave today.   Terisa Starrarina Azrael Maddix, MD  Family Medicine

## 2021-06-19 NOTE — Assessment & Plan Note (Signed)
Question if these symptoms due to GERD. Will trial PPI for 4 weeks then taper off. If persists, will refer back to Dr. Elnoria Howard for EGD. No red flags, no vomiting, blood in stool or weight loss. Other etiologies include IBS, less likely gastroparesis.

## 2021-06-19 NOTE — Telephone Encounter (Signed)
Left mssg informing pt that medication called into pharmacy is OTC.

## 2021-06-19 NOTE — Progress Notes (Signed)
SUBJECTIVE:   CHIEF COMPLAINT: right shoulder pain  HPI:   Veronica SchlatterCathy A Swicegood is a 61 y.o.  with history notable for prediabetes and SLE presenting for right shoulder pain and several other symptoms.   Veronica [name] reports intermittent nausea and sometimes sweating after she eats.  She has associated epigastric pain.  This is triggered primarily by fried greasy foods.  She intermittently has diarrhea when she has mayonnaise.  She denies vomiting, right-sided pain.  She denies melena or hematochezia.  Her stools are either loose if she has vegetables or hard at times as well.  She is followed with gastroenterology for diverticulosis.  She reports omeprazole helps her symptoms that she does notice when she is off this her symptoms flare.  She has a history of a cholecystectomy.  Veronica [name] is right-hand dominant.  For about a month or more she has had right shoulder pain.  This is worse when she goes to raise her hand above her shoulder.  She also notices that she can no longer sleep on her right side at night.  She denies numbness or tingling in her arm.  She denies neck pain.  She denies trauma or prior injury.  She was taking meloxicam intermittently for her ankle and thinks this may have helped but cannot remember.  No prior imaging for review.  The patient reports intermittent jaw pain.  She reports is worse when she chews hard things like nuts.  She does believe she clenches her teeth at night.  She denies vision changes, severe headaches, ear pain or sinus pressure.  This is bilateral nature. Has relatively poor dentition--last saw dentist >6 months ago.   PERTINENT  PMH / PSH/Family/Social History : updated--also notable history of prediabetes   OBJECTIVE:   BP 131/73    Pulse 66    Ht 5\' 4"  (1.626 m)    Wt 105 kg    SpO2 100%    BMI 39.72 kg/m   Today's weight:  Last Weight  Most recent update: 06/19/2021 10:15 AM    Weight  105 kg (231 lb 6.4 oz)            Review of prior  weights: Filed Weights   06/19/21 1014  Weight: 105 kg     Cardiac: Regular rate and rhythm. Normal S1/S2. No murmurs, rubs, or gallops appreciated. Lungs: Clear bilaterally to ascultation.  Abdomen: Normoactive bowel sounds. No tenderness to deep or light palpation. + mild epigastric tenderness Psych: Pleasant and appropriate  Shoulder Abduction (C5) Intact + pain on R  Elbow Flexion (C6) intact Shoulder Extension above head (C7) intact + pain on right  Forearm Pronation - C7/8 intact Wrist Extension (C6) intact Wrist Flexion (C7) intact Fingers Extension/ Flexion (C7, C8) intact Finger Abduction/adduction (T1) intact    Negative Spurling  + Hawkins and empty can on RIGHT  + Painful arc  ASSESSMENT/PLAN:   Chronic abdominal pain Question if these symptoms due to GERD. Will trial PPI for 4 weeks then taper off. If persists, will refer back to Dr. Elnoria HowardHung for EGD. No red flags, no vomiting, blood in stool or weight loss. Other etiologies include IBS, less likely gastroparesis.    Shoulder pain, right most likely rotator cuff injury, good ROM doubt frozen shoulder or OA. Will obtain Xray to evaluate for bony pathology, HEP given, discussed. Given abdominal symptoms, topical NSAID. Referral to Sports Medicine for possible ultrasound for diagnosis (and question if would benefit from subacromial injection)  Jaw  Pain, question if TMJ vs due to dental issues. No suggestive findings of temporal arteritis, recommended bite guard   Annual physical - UTD On Pap (hysterectomy for benign reasons) - Colonoscopy due in 2027 (10 years per patient) - Mammogram in July - Labs next due in April/May (A1C) - UTD on vaccines     [name], MD  Family Medicine Teaching Service  [facility]

## 2021-06-26 ENCOUNTER — Ambulatory Visit: Payer: 59

## 2021-06-27 ENCOUNTER — Ambulatory Visit: Payer: 59 | Admitting: Sports Medicine

## 2021-06-27 ENCOUNTER — Other Ambulatory Visit: Payer: Self-pay | Admitting: *Deleted

## 2021-06-27 ENCOUNTER — Encounter: Payer: Self-pay | Admitting: Family Medicine

## 2021-06-27 VITALS — BP 141/77 | Ht 64.0 in | Wt 230.0 lb

## 2021-06-27 DIAGNOSIS — M7541 Impingement syndrome of right shoulder: Secondary | ICD-10-CM

## 2021-06-27 DIAGNOSIS — M25511 Pain in right shoulder: Secondary | ICD-10-CM

## 2021-06-27 MED ORDER — TOPIRAMATE 25 MG PO TABS: ORAL_TABLET | ORAL | 2 refills | Status: DC

## 2021-06-27 NOTE — Progress Notes (Signed)
Subjective:    Patient ID: Veronica [name], female    DOB: 1960-12-12, 61 y.o.   MRN: [id number]  HPI chief complaint right shoulder pain  [name] presents today complaining of several months of right shoulder pain.  She denies any injury to the shoulder.  She is right-hand dominant.  Pain is diffuse throughout the shoulder and is present with activity such as abduction and occasionally with sleeping on her right side at night.  Pain does not radiate.  No prior shoulder problems in the past.  No prior shoulder surgeries.  She does have some meloxicam which she will take on occasion but she has been cautioned about taking too many NSAIDs.  She recently saw her PCP, Dr. Manson Passey, who recommended that she see [place] for work-up and treatment of this right shoulder pain.  Past medical history reviewed Medications reviewed Allergies reviewed    Review of Systems As above    Objective:   Physical Exam  Well-developed, well-nourished.  No acute distress.  Right shoulder: Full range of motion.  Positive painful arc.  No tenderness to palpation.  Positive empty can, positive Hawkins.  Rotator cuff strength is 5/5.  Negative O'Brien's.  Negative crossarm abduction.  Neurovascularly intact distally.       Assessment & Plan:   Right shoulder pain likely secondary to rotator cuff impingement  I discussed treatment options including home exercise program, formal physical therapy, and a subacromial cortisone injection.  She has elected to try the home exercises first.  She will follow-up with me again in 4 weeks for reevaluation.  If symptoms persist then we will reconsider merits of subacromial cortisone injection and formal physical therapy prior to imaging.  Of note, her PCP did order x-rays of her shoulder but I do not think she needs x-rays at this time.  She will call with questions or concerns prior to her follow-up visit.  This note was dictated using Dragon naturally speaking software and may  contain errors in syntax, spelling, or content which have not been identified prior to signing this note.

## 2021-06-27 NOTE — Progress Notes (Signed)
Patient was instructed in 10 minutes of therapeutic exercises for right shoulder pain to improve strength, ROM and function according to my instructions and plan of care by a Certified Athletic Trainer during the office visit.  Proper technique shown and discussed, handout provided.  All questions discussed and answered.  ?

## 2021-07-07 ENCOUNTER — Other Ambulatory Visit: Payer: Self-pay

## 2021-07-07 ENCOUNTER — Encounter: Payer: Self-pay | Admitting: Family Medicine

## 2021-07-07 ENCOUNTER — Ambulatory Visit (INDEPENDENT_AMBULATORY_CARE_PROVIDER_SITE_OTHER): Payer: 59 | Admitting: Family Medicine

## 2021-07-07 VITALS — BP 118/58 | HR 61 | Wt 230.0 lb

## 2021-07-07 DIAGNOSIS — J069 Acute upper respiratory infection, unspecified: Secondary | ICD-10-CM | POA: Diagnosis not present

## 2021-07-07 NOTE — Patient Instructions (Signed)
It was wonderful to meet you today. Thank you for allowing me to be a part of your care. Below is a short summary of what we discussed at your visit today: ? ?Upper respiratory infection ?This sounds like COVID.  ?Because you are out of the window to use COVID medicine, we wouldn't need to test you here to confirm COVID ?Simply wear a mask around anyone not in your immediate household ?Try honey and pineapple juice for your cough ?Use humidifier and nasal saline spray for comfort ?Cough drops too ? ?Fluids: make sure your child drinks enough water or Pedialyte; for older kids Gatorade is okay too. Signs of dehydration are not making tears or urinating less than once every 8-10 hours. ? ?Treatment:  ?- give 1 tablespoon of honey 3-4 times a day.  ?- You can also mix honey and lemon in chamomille or peppermint tea.  ?- You can use nasal saline to loosen nose mucus ?- Place a humidifier next to her bed while she sleeps ?- If someone is taking a hot shower, let her sit in the bathroom to breathe in the steam ?- research studies show that honey works better than cough medicine. Do not give kids cough medicine; every year in the [place] kids overdose on cough medicine.  ? ?Timeline:  ?- fever, runny nose, and fussiness get worse up to day 4 or 5, but then get better ?- it can take 2-3 weeks for cough to completely go away ?- cough may take weeks to resolve ? ? ?Please bring all of your medications to every appointment! ? ?If you have any questions or concerns, please do not hesitate to contact us via phone or MyChart message.  ? ?Fayette Phoatherine Danyell Shader, MD  ?

## 2021-07-07 NOTE — Assessment & Plan Note (Signed)
Acute, 1.5 weeks duration. Suspect COVID because of dysgeusia and anosmia. Given duration, discussed utility of swabbing for flu or COVID. Using shared decision making, decline to swab today. No suspicion of secondary pneumonia based on physical exam. Conservative measures discussed. Return precautions given. See AVS for more.  ?

## 2021-07-07 NOTE — Progress Notes (Signed)
?  SUBJECTIVE:  ? ?CHIEF COMPLAINT / HPI:  ? ?URI ?1.5 week duration, started last Wednesday ?No fevers, chills, muscle ache, n/v ?Last week had phlegm and wet cough ?This week has headache, diarrhea x 12 hours, burning chest with coughing, dry cough, lack of taste and smell ?UTD on flu and COVID vaccines ?History of walking pneumonia ?Normal water intake, normal UOP ? ?PERTINENT  PMH / PSH:  ?Patient Active Problem List  ? Diagnosis Date Noted  ? Esophageal dysphagia 04/15/2020  ? Mood disorder ([place]) 11/13/2019  ? Atherosclerosis of aorta ([place]) 07/15/2019  ? Prediabetes 07/13/2019  ? Chronic abdominal pain 07/13/2019  ? Viral URI with cough 05/28/2014  ? Insomnia 12/15/2012  ? Essential hypertension, benign 04/10/2012  ? SLE (systemic lupus erythematosus) ([place]) 04/10/2012  ?  ?OBJECTIVE:  ? ?BP (!) 118/58   Pulse 61   Wt 230 lb (104.3 kg)   SpO2 98%   BMI 39.48 kg/m?   ? ?PHQ-9:  ?Depression screen [facility] 2/9 07/07/2021 06/19/2021 04/07/2021  ?Decreased Interest 2 2 2   ?Down, Depressed, Hopeless 2 2 2   ?PHQ - 2 Score 4 4 4   ?Altered sleeping 0 2 2  ?Tired, decreased energy 2 2 2   ?Change in appetite 2 1 2   ?Feeling bad or failure about yourself  1 2 0  ?Trouble concentrating 1 - 2  ?Moving slowly or fidgety/restless 2 0 2  ?Suicidal thoughts 0 0 0  ?PHQ-9 Score 12 11 14   ?Difficult doing work/chores - - -  ?Some recent data might be hidden  ?  ?GAD-7:  ?GAD 7 : Generalized Anxiety Score 12/08/2019 09/07/2019  ?Nervous, Anxious, on Edge 2 2  ?Control/stop worrying 2 1  ?Worry too much - different things 2 1  ?Trouble relaxing 3 2  ?Restless 2 2  ?Easily annoyed or irritable 0 1  ?Afraid - awful might happen 0 1  ?Total GAD 7 Score 11 10  ?Anxiety Difficulty Somewhat difficult -  ? ?Physical Exam ?General: Awake, alert, oriented ?HEENT: TM normal bilaterally, sclera anicteric, nasal mucosa erythematous, moist oral mucosa without lesion, no palpable lymphadenopathy ?Cardiovascular: Regular rate and rhythm, S1 and S2  present, no murmurs auscultated ?Respiratory: Lung fields clear to auscultation bilaterally, no wheezes/rales/rhonchi auscultated ? ?ASSESSMENT/PLAN:  ? ?Viral URI with cough ?Acute, 1.5 weeks duration. Suspect COVID because of dysgeusia and anosmia. Given duration, discussed utility of swabbing for flu or COVID. Using shared decision making, decline to swab today. No suspicion of secondary pneumonia based on physical exam. Conservative measures discussed. Return precautions given. See AVS for more.  ?  ? ? ?Ezequiel Essex, MD ?[place]  ?

## 2021-07-12 ENCOUNTER — Ambulatory Visit: Payer: 59 | Admitting: Licensed Clinical Social Worker

## 2021-07-12 NOTE — Chronic Care Management (AMB) (Signed)
?  Care Management  ? ?Social Work Visit Note ? ?07/12/2021 ?Name: Veronica [name] MRN: [id number] DOB: 07-May-1960 ? ?Veronica [name] is a 61 y.o. year old female who sees Westley Chandler, MD for primary care. The care management team was consulted for assistance with care management and care coordination needs related to Mental Health Counseling and Resources  ? ?Patient was given the following information about care management and care coordination services today, agreed to services, and gave verbal consent: 1.care management/care coordination services include personalized support from designated clinical staff supervised by their physician, including individualized plan of care and coordination with other care providers 2. 24/7 contact phone numbers for assistance for urgent and routine care needs. 3. The patient may stop care management/care coordination services at any time by phone call to the office staff. ? ?Engaged with patient by telephone for initial visit in response to provider referral for social work chronic care management and care coordination services. ? ?Assessment: Review of patient history, allergies, and health status during evaluation of patient need for care management/care coordination services.   ? ?Interventions:  ?Patient interviewed and appropriate assessments performed ?Collaborated with clinical team regarding patient needs  ?Patient needed assistance with mental health resources. SW sent e-mail to patient with how patient can view therapist within her network with [employer]. SW also submitted a quartet referral for patient to received information regarding therapist in network.  ? ?SDOH (Social Determinants of Health) assessments performed: Yes ?   ? ?Plan:  ?No further follow up is needed. Patient has SW contact information if needed.  ? ?Christen Butter, BSW  ?Social Worker ?[facility] Care Management  ?[phone] ?  ? ? ? ? ? ? ? ? ? ? ? ? ? ? ?

## 2021-07-13 NOTE — Patient Instructions (Signed)
Visit Information ? ?Instructions: patient will work with SW to address concerns related to finding a therapist. ? ?Patient was given the following information about care management and care coordination services today, agreed to services, and gave verbal consent: 1.care management/care coordination services include personalized support from designated clinical staff supervised by their physician, including individualized plan of care and coordination with other care providers 2. 24/7 contact phone numbers for assistance for urgent and routine care needs. 3. The patient may stop care management/care coordination services at any time by phone call to the office staff. ? ?Patient verbalizes understanding of instructions and care plan provided today and agrees to view in MyChart. Active MyChart status confirmed with patient.   ? ?No additional follow up needed/ ? ?Christen ButterBianca Lynea Rollison, BSW  ?Social Worker ?[facility] Care Management  ?[phone] ?  ? ?  ?

## 2021-07-25 ENCOUNTER — Ambulatory Visit: Payer: 59 | Admitting: Sports Medicine

## 2021-08-09 ENCOUNTER — Encounter: Payer: Self-pay | Admitting: Family Medicine

## 2021-08-10 MED ORDER — FLUCONAZOLE 150 MG PO TABS
150.0000 mg | ORAL_TABLET | ORAL | 0 refills | Status: DC
Start: 2021-08-10 — End: 2021-10-20

## 2021-09-01 ENCOUNTER — Encounter: Payer: Self-pay | Admitting: Family Medicine

## 2021-09-01 ENCOUNTER — Ambulatory Visit (INDEPENDENT_AMBULATORY_CARE_PROVIDER_SITE_OTHER): Payer: 59 | Admitting: Family Medicine

## 2021-09-01 ENCOUNTER — Other Ambulatory Visit: Payer: Self-pay

## 2021-09-01 ENCOUNTER — Other Ambulatory Visit (HOSPITAL_COMMUNITY)
Admission: RE | Admit: 2021-09-01 | Discharge: 2021-09-01 | Disposition: A | Discharge status: Home / Self Care | Payer: 59 | Source: Ambulatory Visit | Attending: Family Medicine | Admitting: Family Medicine

## 2021-09-01 VITALS — BP 117/69 | HR 69 | Wt 231.6 lb

## 2021-09-01 DIAGNOSIS — J302 Other seasonal allergic rhinitis: Secondary | ICD-10-CM | POA: Diagnosis not present

## 2021-09-01 DIAGNOSIS — N898 Other specified noninflammatory disorders of vagina: Secondary | ICD-10-CM

## 2021-09-01 DIAGNOSIS — R7303 Prediabetes: Secondary | ICD-10-CM

## 2021-09-01 DIAGNOSIS — F39 Unspecified mood [affective] disorder: Secondary | ICD-10-CM

## 2021-09-01 DIAGNOSIS — I10 Essential (primary) hypertension: Secondary | ICD-10-CM | POA: Diagnosis not present

## 2021-09-01 DIAGNOSIS — R1319 Other dysphagia: Secondary | ICD-10-CM

## 2021-09-01 DIAGNOSIS — F419 Anxiety disorder, unspecified: Secondary | ICD-10-CM

## 2021-09-01 LAB — POCT GLYCOSYLATED HEMOGLOBIN (HGB A1C): HbA1c, POC (controlled diabetic range): 5.8 % (ref 0.0–7.0)

## 2021-09-01 LAB — POCT WET PREP (WET MOUNT)
Clue Cells Wet Prep Whiff POC: NEGATIVE
Trichomonas Wet Prep HPF POC: ABSENT

## 2021-09-01 MED ORDER — BUSPIRONE HCL 5 MG PO TABS: 5.0000 mg | ORAL_TABLET | Freq: Three times a day (TID) | ORAL | 2 refills | Status: DC

## 2021-09-01 MED ORDER — FLUTICASONE PROPIONATE 50 MCG/ACT NA SUSP: 2.0000 | Freq: Every day | NASAL | 6 refills | Status: DC

## 2021-09-01 MED ORDER — FEXOFENADINE HCL 60 MG PO TABS: 60.0000 mg | ORAL_TABLET | Freq: Two times a day (BID) | ORAL | 2 refills | Status: DC

## 2021-09-01 NOTE — Progress Notes (Signed)
? ? ?SUBJECTIVE:  ? ?CHIEF COMPLAINT: follow up and A1C check  ?HPI:  ? ?Veronica SchlatterCathy A [name] is a 61 y.o.  with history notable for HTN, SLE, and dyspepsia presenting for follow up. ? ?GERD Symptoms ?Patient reports her reflux symptoms are very well controlled with omeprazole.  She does report she thinks she is dairy intolerant and notes that she has intermittent diarrhea when she eats dairy.  She uses lactose-free milk but does not use Lactaid. ? ?Discharge  ?The patient reports ongoing vaginal discharge.  She was recently treated for suspected vulvovaginal candidiasis due to itching.  She reports no new sexual partners.  She is status post hysterectomy for benign reasons.  She notes some itching and dryness.  She has had very mild discharge.  She has not tried anything for this. ? ?Mood/Sleep  ?The patient reports her sleep is okay.  Her sleep cycle is disrupted as when she is at work she does not sleep well and sleeps at work.  At home she has improved sleep. ?patient reports her anxiety is higher than usual.  She is taking BuSpar 10 mg only at night.  She reports compliance with her Prozac.  She denies thoughts of hurting yourself or others.  She reports the reasons for anxiety include stressors around her granddaughter who does not behave well, her family and her work.  She is interested in counseling, had not had success with quartet.  ? ?Allergies  ?Patient reports her allergies are worse than usual.  She reports her Zyrtec is stopped working.  She endorses congestion and some hoarseness at the end of the day.  No dyspnea or chest pain. ? ?Patient has been trying to lose weight.  She is now exercising and walking half mile each day.  She intermittently watches her diet.  She is not interested in seeing nutrition.  Semaglutide is not covered by insurance.  She reports sometimes she does feel fatigued after exercise.  No chest pain or dyspnea. ? ?PERTINENT  PMH / PSH/Family/Social History : prediabetes, SLE, HTN,  adjustment disorder  ? ?OBJECTIVE:  ? ?BP 117/69   Pulse 69   Wt 231 lb 9.6 oz (105.1 kg)   SpO2 99%   BMI 39.75 kg/m?   ?Today's weight:  ?Last Weight  Most recent update: 09/01/2021 10:19 AM  ? ? Weight  ?105.1 kg (231 lb 9.6 oz)  ?      ? ?  ? ?Review of prior weights: ?Filed Weights  ? 09/01/21 1018  ?Weight: 231 lb 9.6 oz (105.1 kg)  ? ? ? ?Cardiac: Regular rate and rhythm. Normal S1/S2. No murmurs, rubs, or gallops appreciated. ?Lungs: Clear bilaterally to ascultation.  ?GU Exam:  ?  External exam: Normal-appearing female external genitalia.  Vaginal exam notable for thin discharge.  Cervix absent ?Chaperoned examine, CMA [place].  ?   ?Psych: Pleasant and appropriate  ? ?ASSESSMENT/PLAN:  ? ?Esophageal dysphagia ?Continue proton pump inhibitor. ? ?Prediabetes ?A1c today similar to prior.  Discussed options.  Her insurance does not cover GLP-1 agonist.  Referral to healthy weight and wellness. ? ?Mood disorder (HCC) ?Discussed at length.  Increase BuSpar to 3 times daily dosing.  We will reach out to our social worker to connect her with resources as she has had continued difficulty. ?  ?Vaginal discharge, likely atrophic vs. BV. Sent for wet mount, GC/CT  ? ?Allergies, no fevers or other viral symptoms, rotate antihistamine and start intranasal steroid.  ? ?Follow-up in 1 to 2  months discussed fatigue after exercise. ? ? ? ?[name], MD  ?Family Medicine Teaching Service  ?[facility]  ? ? ?

## 2021-09-01 NOTE — Assessment & Plan Note (Signed)
Discussed at length.  Increase BuSpar to 3 times daily dosing.  We will reach out to our social worker to connect her with resources as she has had continued difficulty. ?

## 2021-09-01 NOTE — Patient Instructions (Addendum)
It was wonderful to see you today. ? ?Please bring ALL of your medications with you to every visit.  ? ?Today we talked about: ?Allergies ?- STOP Zyrtec ?- START Allergra twice a day and flonase  ? ?Diarrhea ?- Try lactaid before meals ? ?Your A1C today was 5.8 ? ?Mood and Stress  ? ?[facility] ? ?-I will call you with results ? ?Weight loss ?Call [facility] ?Address: [address], [place], [place] [address] ?Phone: [phone] ? ?Follow up in 1-2 months to discuss fatigue after walking--please bring a diary of your sleep and activity ?Keep up the GREAT work with walking  ? ? ?Thank you for choosing [facility].  ? ?Please call [phone] with any questions about today's appointment. ? ?Please be sure to schedule follow up at the front  desk before you leave today.  ? ?Terisa Starrarina Dantonio Justen, MD  ?Family Medicine  ?heal ?

## 2021-09-01 NOTE — Assessment & Plan Note (Signed)
A1c today similar to prior.  Discussed options.  Her insurance does not cover GLP-1 agonist.  Referral to healthy weight and wellness. ?

## 2021-09-01 NOTE — Assessment & Plan Note (Signed)
Continue proton pump inhibitor

## 2021-09-02 LAB — BASIC METABOLIC PANEL
BUN/Creatinine Ratio: 13 (ref 9–23)
BUN: 14 mg/dL (ref 6–24)
CO2: 23 mmol/L (ref 20–29)
Calcium: 10.1 mg/dL (ref 8.7–10.2)
Chloride: 101 mmol/L (ref 96–106)
Creatinine, Ser: 1.06 mg/dL — ABNORMAL HIGH (ref 0.57–1.00)
Glucose: 88 mg/dL (ref 70–99)
Potassium: 3.6 mmol/L (ref 3.5–5.2)
Sodium: 143 mmol/L (ref 134–144)
eGFR: 61 mL/min/{1.73_m2} (ref >59)

## 2021-09-04 LAB — CERVICOVAGINAL ANCILLARY ONLY
Chlamydia: NEGATIVE
Comment: NEGATIVE
Comment: NORMAL
Neisseria Gonorrhea: NEGATIVE

## 2021-09-04 NOTE — Progress Notes (Signed)
Scheduled 09/12/21 with BSW ? ?Gwenevere GhaziStacey Amarri Michaelson  ?Care Guide, Embedded Care Coordination ?  Care Management  ?Direct Dial: [phone] ? ?

## 2021-09-08 ENCOUNTER — Encounter: Payer: Self-pay | Admitting: Family Medicine

## 2021-09-11 ENCOUNTER — Telehealth: Payer: Self-pay | Admitting: Family Medicine

## 2021-09-11 ENCOUNTER — Other Ambulatory Visit: Payer: Self-pay | Admitting: Family Medicine

## 2021-09-11 DIAGNOSIS — E876 Hypokalemia: Secondary | ICD-10-CM

## 2021-09-11 DIAGNOSIS — Z0289 Encounter for other administrative examinations: Secondary | ICD-10-CM

## 2021-09-11 MED ORDER — POTASSIUM CHLORIDE CRYS ER 20 MEQ PO TBCR: 20.0000 meq | EXTENDED_RELEASE_TABLET | Freq: Every day | ORAL | 0 refills | Status: DC

## 2021-09-11 NOTE — Telephone Encounter (Signed)
Called patient and discussed hypokalemia.  This is likely due to indapamide therapy.  Started potassium 20 mill equivalents.  She is to call to schedule visit for metabolic panel.  Future lab order placed for BMP. ?

## 2021-09-12 ENCOUNTER — Ambulatory Visit: Payer: 59 | Admitting: Licensed Clinical Social Worker

## 2021-09-18 ENCOUNTER — Other Ambulatory Visit: Payer: Self-pay

## 2021-09-18 DIAGNOSIS — I10 Essential (primary) hypertension: Secondary | ICD-10-CM

## 2021-09-18 MED ORDER — INDAPAMIDE 2.5 MG PO TABS: 2.5000 mg | ORAL_TABLET | Freq: Every day | ORAL | 2 refills | Status: DC

## 2021-09-21 NOTE — Patient Instructions (Signed)
Visit Information  Instructions:   Patient was given the following information about care management and care coordination services today, agreed to services, and gave verbal consent: 1.care management/care coordination services include personalized support from designated clinical staff supervised by their physician, including individualized plan of care and coordination with other care providers 2. 24/7 contact phone numbers for assistance for urgent and routine care needs. 3. The patient may stop care management/care coordination services at any time by phone call to the office staff.  Patient verbalizes understanding of instructions and care plan provided today and agrees to view in MyChart. Active MyChart status and patient understanding of how to access instructions and care plan via MyChart confirmed with patient.     No further follow up required: .  Saket Hellstrom, BSW  Social Worker [facility]  [phone]      

## 2021-09-21 NOTE — Chronic Care Management (AMB) (Signed)
Care Management   Social Work Visit Note  09/21/2021 Name: Veronica [name] MRN: [id number] DOB: June 21, 1960  Veronica [name] is a 61 y.o. year old female who sees Westley Chandler, MD for primary care. The care management team was consulted for assistance with care management and care coordination needs related to [facility] Resources    Patient was given the following information about care management and care coordination services today, agreed to services, and gave verbal consent: 1.care management/care coordination services include personalized support from designated clinical staff supervised by their physician, including individualized plan of care and coordination with other care providers 2. 24/7 contact phone numbers for assistance for urgent and routine care needs. 3. The patient may stop care management/care coordination services at any time by phone call to the office staff.  Engaged with patient by telephone for follow up visit in response to provider referral for social work chronic care management and care coordination services.  Assessment: Review of patient history, allergies, and health status during evaluation of patient need for care management/care coordination services.    Interventions:  Patient interviewed and appropriate assessments performed Collaborated with clinical team regarding patient needs  Patient advised everything is going well and SW services is no longer needed.  SW completed an assessment of needs and patient has no needs at the moment.  Patient has SW contact information if needed in the future.   SDOH (Social Determinants of Health) assessments performed: No     Plan:  No further follow up is needed.  Ander Gaster, MSW Social Worker [facility]  [phone]

## 2021-10-03 ENCOUNTER — Encounter: Payer: Self-pay | Admitting: *Deleted

## 2021-10-20 ENCOUNTER — Ambulatory Visit (INDEPENDENT_AMBULATORY_CARE_PROVIDER_SITE_OTHER): Payer: 59 | Admitting: Family Medicine

## 2021-10-20 ENCOUNTER — Encounter: Payer: Self-pay | Admitting: Family Medicine

## 2021-10-20 VITALS — BP 131/77 | HR 64 | Temp 97.7°F | Wt 233.0 lb

## 2021-10-20 DIAGNOSIS — F39 Unspecified mood [affective] disorder: Secondary | ICD-10-CM | POA: Diagnosis not present

## 2021-10-20 DIAGNOSIS — I10 Essential (primary) hypertension: Secondary | ICD-10-CM

## 2021-10-20 DIAGNOSIS — E876 Hypokalemia: Secondary | ICD-10-CM | POA: Diagnosis not present

## 2021-10-20 DIAGNOSIS — R1319 Other dysphagia: Secondary | ICD-10-CM | POA: Diagnosis not present

## 2021-10-20 MED ORDER — SHINGRIX 50 MCG/0.5ML IM SUSR: 0.5000 mL | INTRAMUSCULAR | 0 refills | Status: AC

## 2021-10-20 NOTE — Assessment & Plan Note (Signed)
No symptoms currently, continue PPI.

## 2021-10-20 NOTE — Assessment & Plan Note (Signed)
Now has an Ambulance person.  Supportive listening provided.  She is doing quite well despite numerous stressors.

## 2021-10-22 LAB — BASIC METABOLIC PANEL
BUN/Creatinine Ratio: 16 (ref 9–23)
BUN: 16 mg/dL (ref 6–24)
CO2: 23 mmol/L (ref 20–29)
Calcium: 10 mg/dL (ref 8.7–10.2)
Chloride: 101 mmol/L (ref 96–106)
Creatinine, Ser: 0.97 mg/dL (ref 0.57–1.00)
Glucose: 97 mg/dL (ref 70–99)
Potassium: 4 mmol/L (ref 3.5–5.2)
Sodium: 140 mmol/L (ref 134–144)
eGFR: 68 mL/min/{1.73_m2} (ref >59)

## 2021-10-22 LAB — MICROALBUMIN / CREATININE URINE RATIO
Creatinine, Urine: 135.5 mg/dL
Microalb/Creat Ratio: 3 mg/g creat (ref 0–29)
Microalbumin, Urine: 4.6 ug/mL

## 2021-10-25 NOTE — Progress Notes (Signed)
NEUROLOGY FOLLOW UP OFFICE NOTE  Veronica [name] [id number]  Assessment/Plan:   Tension-type headache, not intractable - stable.  Reports a dull left temporal headache - brief and infrequent. No red flags.  It is mild - do not suspect concerning etiologies such as temporal arteritis, mass lesion or aneurysm.  Neurologic exam is normal.  Hypertension History of TIA   1.Topiramate 50mg  at bedtime 2. Monitor for headache and contact me if worsens 3.Secondary stroke prevention as managed by PCP: - ASA 81mg  daily - Statin.  LDL goal less than 70 - Normotensive blood pressure - Hgb A1c goal less than 7 4. Follow up one year  Total time spent with patient and reviewing chart:  12 minutes.   Subjective:  Veronica [name] is a 61 year old right-handed woman with SLE, hypertension, and allergic rhinitis who follows up for TIA and headache.   UPDATE: Current medications for secondary stroke prevention:  ASA 81mg , atorvastatin 10mg , losartan   Headaches are now improved with topiramate.  They are mild and infrequent lasting an hour.  For a couple of months she reports a mild left temporal throbbing headache.  Usually lasts a minute or two.  Occurs maybe once a week.  No associated nausea, vomiting, photophobia, phonophobia, osmophobia, visual disturbance, autonomic symptoms or numbness/weakness.    Current NSAIDS:  Naproxen 500mg   Current analgesics:  Tylenol Current triptans:  none Current ergotamine:  none Current anti-emetic:  none Current muscle relaxants:  Flexeril  Current anti-anxiolytic:  none Current sleep aide:  none Current Antihypertensive medications:  Losartan, indapamide, spironolactone Current Antidepressant medications:  fluoxetine Current Anticonvulsant medications:  topiramate 50mg  at bedtime Current anti-CGRP:  none Current Vitamins/Herbal/Supplements:  none Current Antihistamines/Decongestants:  Zyrtec Other therapy:  Vicks vapor rub to forehead Other  medication:  Plaquenil   Caffeine:  No coffee.  Sometimes Coke Diet:  Drinks 3 bottles of 16.9 oz water daily.  1 can of Fanta Orange or Veronica [name] Exercise:  no Depression:  no; Anxiety:  Increased work-related anxiety Other pain:  no Sleep hygiene:  Poor.  Diagnosed with mild OSA in 2019. Instructed to lose weight and follow up.  She reports having lost weight but sleep has not improved.     HISTORY:  She was seen in the ED at [facility] on 12/05/2019 for left sided facial droop and slurred speech.  She was on the phone when she started feeling lightheaded and noted slurred speech.  She called 911 and when EMS arrived, they noticed subtle left sided facial droop.  She had an associated severe pounding frontal headache.  No unilateral numbness or weakness of extremities. She presented as a code stroke.  Blood pressure initially 130 systolic but later 170.  No objective neurologic signs on exam in the ED (NIHSS 0).  CT and MRI of brain personally reviewed were unremarkable.  tPA was not administered due to resolution of symptoms.  She was discharged on ASA 81mg  daily.  Since then, she has had a daily headache.  She also has feeling of heat in her body and has increased nervousness and shakiness.  She feels off balance.  When she closes her eyes, she sees darkness that is moving quickly across her visual field.  Due to increased anxiety, she was started on Prozac.  Lipitor was increased from 10mg  to 40mg  daily.  CTA head and neck on 01/08/2020 showed no large vessel stenosis or occlusion.  In December 2021, she has had episodes of "black outs" in which  she is standing and suddenly feels off balance, possibly loss of awareness for a second or so.  She also has been stuttering or have slurred speech.  Orthostatic vitals reportedly normal.  She was told that it could have been dehydration so she started increasing water intake and it has resolved.  05/24/2020 2D echocardiogram:  EF 60-65% with no  cardiac source of emboli.  12 day Cardiac event monitor:  No afib or significant heart block   She has history of headaches since 2012-2013, described as moderate bifrontal non-throbbing headache with slight dizziness.   She has a known partially empty sella on MRI of brain with and without contrast from 04/08/15 and MRI of brain without contrast on 07/12/11.  She has her eyes examined annually.  No significant issues.     Labs from July:  Hgb A1c 5.5; LDL 60 Labs from August:  SARS Coronvirus 2 negative; TSH 1.150   Past NSAIDS:  ibuprofen Past analgesics:  Excedrin Past abortive triptans:  none Past abortive ergotamine:  none Past muscle relaxants:  none Past anti-emetic:  none Past antihypertensive medications:  none Past antidepressant medications:  none Past anticonvulsant medications:  none Past anti-CGRP:  none Past vitamins/Herbal/Supplements:  none Past antihistamines/decongestants:  none Other past therapies:  none     Sister has MS.  PAST MEDICAL HISTORY: Past Medical History:  Diagnosis Date   Daily headache    Fibromyalgia    G6PD deficiency    outside labs--low level, no longer on HCQ   Glucose 6 phosphatase deficiency (HCC)    Hypertension    Prediabetes    Rheumatoid arthritis (HCC)    SLE (systemic lupus erythematosus) (HCC) 1998    MEDICATIONS: Current Outpatient Medications on File Prior to Visit  Medication Sig Dispense Refill   aspirin EC 81 MG tablet Take 1 tablet (81 mg total) by mouth daily. Swallow whole. 30 tablet 11   atorvastatin (LIPITOR) 10 MG tablet Take 1 tablet (10 mg total) by mouth daily. 90 tablet 3   busPIRone (BUSPAR) 5 MG tablet Take 1 tablet (5 mg total) by mouth 3 (three) times daily. 180 tablet 2   fexofenadine (ALLEGRA ALLERGY) 60 MG tablet Take 1 tablet (60 mg total) by mouth 2 (two) times daily. 180 tablet 2   FLUoxetine (PROZAC) 20 MG capsule Take 1 capsule (20 mg total) by mouth daily. 90 capsule 2   fluticasone (FLONASE) 50  MCG/ACT nasal spray Place 2 sprays into both nostrils daily. 16 g 6   hydroxychloroquine (PLAQUENIL) 200 MG tablet Take 400 mg by mouth at bedtime.      indapamide (LOZOL) 2.5 MG tablet Take 1 tablet (2.5 mg total) by mouth daily. 90 tablet 2   ketoconazole (NIZORAL) 2 % cream Apply 1 application topically daily. To back when area flares 60 g 3   losartan (COZAAR) 100 MG tablet Take 1 tablet (100 mg total) by mouth daily. 90 tablet 1   omeprazole (PRILOSEC) 20 MG capsule Take 1 capsule (20 mg total) by mouth daily. 30 capsule 3   polyethylene glycol powder (GLYCOLAX/MIRALAX) 17 GM/SCOOP powder Take 17 g by mouth 2 (two) times daily as needed. 3350 g 1   potassium chloride SA (KLOR-CON M) 20 MEQ tablet Take 1 tablet (20 mEq total) by mouth daily. 60 tablet 0   spironolactone (ALDACTONE) 25 MG tablet Take 1 tablet (25 mg total) by mouth daily. 90 tablet 2   topiramate (TOPAMAX) 25 MG tablet Take 2 tablets at bedtime 60  tablet 2   Zoster Vaccine Adjuvanted [facility]) injection Inject 0.5 mLs into the muscle every 2 (two) months for 2 doses. 1 mL 0   No current facility-administered medications on file prior to visit.    ALLERGIES: Allergies  Allergen Reactions   Amlodipine Swelling    Leg edema   Tessalon [Benzonatate] Other (See Comments)    Lip swelling    FAMILY HISTORY: Family History  Problem Relation Age of Onset   Diabetes Mother    Hypertension Father    Multiple sclerosis Sister    Hypertension Brother       Objective:   General: No acute distress.  Patient appears well-groomed.   Head:  Normocephalic/atraumatic Eyes:  Fundi examined but not visualized Neck: supple, no paraspinal tenderness, full range of motion Heart:  Regular rate and rhythm Neurological Exam: alert and oriented to person, place, and time.  Speech fluent and not dysarthric, language intact.  CN II-XII intact. Bulk and tone normal, muscle strength 5/5 throughout.  Sensation to light touch intact.  Deep  tendon reflexes 2+ throughout, toes downgoing.  Finger to nose testing intact.  Gait normal, Romberg negative.   Shon Millet, DO  CC: Veronica Riches, MD

## 2021-10-26 ENCOUNTER — Encounter: Payer: Self-pay | Admitting: Neurology

## 2021-10-26 ENCOUNTER — Encounter: Payer: Self-pay | Admitting: Family Medicine

## 2021-10-26 ENCOUNTER — Ambulatory Visit: Payer: 59 | Admitting: Neurology

## 2021-10-26 DIAGNOSIS — Z8673 Personal history of transient ischemic attack (TIA), and cerebral infarction without residual deficits: Secondary | ICD-10-CM | POA: Diagnosis not present

## 2021-10-26 DIAGNOSIS — I10 Essential (primary) hypertension: Secondary | ICD-10-CM

## 2021-10-26 DIAGNOSIS — G44219 Episodic tension-type headache, not intractable: Secondary | ICD-10-CM

## 2021-10-26 MED ORDER — POTASSIUM CHLORIDE 20 MEQ PO PACK: 20.0000 meq | PACK | Freq: Every day | ORAL | 1 refills | Status: DC

## 2021-11-01 MED ORDER — POTASSIUM CHLORIDE CRYS ER 20 MEQ PO TBCR: 20.0000 meq | EXTENDED_RELEASE_TABLET | Freq: Every day | ORAL | 3 refills | Status: DC

## 2021-11-01 NOTE — Addendum Note (Signed)
Addended by: Manson Passey, Jearlene Bridwell on: 11/01/2021 11:07 AM   Modules accepted: Orders

## 2021-12-06 ENCOUNTER — Other Ambulatory Visit: Payer: Self-pay

## 2021-12-06 MED ORDER — FLUOXETINE HCL 20 MG PO CAPS: 20.0000 mg | ORAL_CAPSULE | Freq: Every day | ORAL | 3 refills | Status: DC

## 2021-12-13 ENCOUNTER — Ambulatory Visit (INDEPENDENT_AMBULATORY_CARE_PROVIDER_SITE_OTHER): Payer: BLUE CROSS/BLUE SHIELD | Admitting: Family Medicine

## 2021-12-27 ENCOUNTER — Ambulatory Visit (INDEPENDENT_AMBULATORY_CARE_PROVIDER_SITE_OTHER): Payer: BLUE CROSS/BLUE SHIELD | Admitting: Family Medicine

## 2021-12-29 ENCOUNTER — Other Ambulatory Visit: Payer: Self-pay | Admitting: *Deleted

## 2021-12-29 DIAGNOSIS — I10 Essential (primary) hypertension: Secondary | ICD-10-CM

## 2021-12-29 MED ORDER — LOSARTAN POTASSIUM 100 MG PO TABS: 100.0000 mg | ORAL_TABLET | Freq: Every day | ORAL | 1 refills | Status: DC

## 2022-01-16 ENCOUNTER — Ambulatory Visit (INDEPENDENT_AMBULATORY_CARE_PROVIDER_SITE_OTHER): Payer: Commercial Managed Care - HMO | Admitting: Family Medicine

## 2022-01-16 ENCOUNTER — Other Ambulatory Visit: Payer: Self-pay

## 2022-01-16 ENCOUNTER — Encounter: Payer: Self-pay | Admitting: Family Medicine

## 2022-01-16 VITALS — BP 117/77 | HR 68 | Wt 237.0 lb

## 2022-01-16 DIAGNOSIS — J302 Other seasonal allergic rhinitis: Secondary | ICD-10-CM

## 2022-01-16 DIAGNOSIS — Z1231 Encounter for screening mammogram for malignant neoplasm of breast: Secondary | ICD-10-CM | POA: Diagnosis not present

## 2022-01-16 DIAGNOSIS — I10 Essential (primary) hypertension: Secondary | ICD-10-CM | POA: Diagnosis not present

## 2022-01-16 DIAGNOSIS — E876 Hypokalemia: Secondary | ICD-10-CM | POA: Diagnosis not present

## 2022-01-16 DIAGNOSIS — F39 Unspecified mood [affective] disorder: Secondary | ICD-10-CM

## 2022-01-16 DIAGNOSIS — R7303 Prediabetes: Secondary | ICD-10-CM | POA: Diagnosis not present

## 2022-01-16 DIAGNOSIS — Z23 Encounter for immunization: Secondary | ICD-10-CM

## 2022-01-16 DIAGNOSIS — Z1239 Encounter for other screening for malignant neoplasm of breast: Secondary | ICD-10-CM

## 2022-01-16 MED ORDER — BACLOFEN 5 MG PO TABS: 10.0000 mg | ORAL_TABLET | Freq: Every evening | ORAL | 0 refills | Status: DC

## 2022-01-16 MED ORDER — SEMAGLUTIDE-WEIGHT MANAGEMENT 0.25 MG/0.5ML ~~LOC~~ SOAJ: 0.2500 mg | SUBCUTANEOUS | 1 refills | Status: DC

## 2022-01-16 MED ORDER — CETIRIZINE HCL 10 MG PO TABS: 10.0000 mg | ORAL_TABLET | Freq: Every day | ORAL | 11 refills | Status: DC
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
## 2022-01-16 NOTE — Patient Instructions (Addendum)
It was wonderful to see you today.  Please bring ALL of your medications with you to every visit.   Today we talked about:  I recommend you undergo a mammogram.   You can call to schedule an appointment by calling [phone].   Directions [place], [place] [address]  Please let me know if you have questions. I will send you a letter or call you with results.    I sent in your [employer]--call me if this is NOT covered or covered  For your leg - I sent in a muscle relaxer to use at night when you are not working - Stretching--3 times a day for 10 minutes -Call me if this is not better in 3 weeks   For information on therapists, please go to [email or web address]. You can also contact your insurance company to find an in-network therapist.   Please follow up in 3 months   Thank you for choosing [facility].   Please call [phone] with any questions about today's appointment.  Please be sure to schedule follow up at the front  desk before you leave today.   Dorris Singh, MD  Family Medicine

## 2022-01-16 NOTE — Assessment & Plan Note (Signed)
At goal today, check BMP to see if we can discontinue KCl

## 2022-01-16 NOTE — Assessment & Plan Note (Signed)
Discussed acute stress reaction, referred to counseling.  Numbers given.

## 2022-01-17 LAB — BASIC METABOLIC PANEL
BUN/Creatinine Ratio: 14 (ref 9–23)
BUN: 14 mg/dL (ref 6–24)
CO2: 23 mmol/L (ref 20–29)
Calcium: 9.4 mg/dL (ref 8.7–10.2)
Chloride: 100 mmol/L (ref 96–106)
Creatinine, Ser: 0.99 mg/dL (ref 0.57–1.00)
Glucose: 95 mg/dL (ref 70–99)
Potassium: 3.8 mmol/L (ref 3.5–5.2)
Sodium: 137 mmol/L (ref 134–144)
eGFR: 66 mL/min/{1.73_m2} (ref >59)

## 2022-01-30 ENCOUNTER — Encounter: Payer: Self-pay | Admitting: Sports Medicine

## 2022-02-01 ENCOUNTER — Ambulatory Visit (INDEPENDENT_AMBULATORY_CARE_PROVIDER_SITE_OTHER): Payer: Commercial Managed Care - HMO | Admitting: Sports Medicine

## 2022-02-01 VITALS — BP 118/74 | Ht 64.0 in | Wt 236.0 lb

## 2022-02-01 DIAGNOSIS — M25561 Pain in right knee: Secondary | ICD-10-CM

## 2022-02-01 DIAGNOSIS — M25511 Pain in right shoulder: Secondary | ICD-10-CM | POA: Diagnosis not present

## 2022-02-01 MED ORDER — METHYLPREDNISOLONE ACETATE 40 MG/ML IJ SUSP
40.0000 mg | Freq: Once | INTRAMUSCULAR | Status: AC
  Administered 2022-02-01: 40 mg via INTRA_ARTICULAR

## 2022-02-01 NOTE — Progress Notes (Signed)
Subjective:    Patient ID: Veronica [name], female    DOB: 08-27-1960, 61 y.o.   MRN: [id number]  HPI chief complaint: Right shoulder pain and right knee pain  Sherrol presents today with a couple of different complaints.  First complaint is right knee pain has been present for about a month.  She denies any trauma.  She returned from a trip to the beach at the beginning of September and began to have diffuse pain and swelling shortly thereafter.  She has tried Tylenol which has been somewhat helpful.  Her PCP put her on a muscle relaxer which has not been effective.  She had similar pain in the left knee a few years ago that responded well to a cortisone injection.  No prior knee surgeries.  She is also presenting today with returning right shoulder pain.  She has a history of rotator cuff tendinopathy/impingement.  No new trauma.  Pain is diffuse in the shoulder and similar to the pain she has had previously with her rotator cuff impingement.  She would like a cortisone injection here today as well.  Interim medical history reviewed Medications reviewed Allergies reviewed    Review of Systems As above    Objective:   Physical Exam  Well-developed, well-nourished.  No acute distress  Right shoulder: Full range of motion with a positive painful arc.  Rotator cuff is 5/5 but does reproduce pain with resisted supraspinatus.  Neurovascular intact distally.  Right knee: Range of motion 0 to 120 degrees.  Trace to 1+ effusion.  She is tender to palpation along medial and lateral joint lines.  Negative McMurray's.  Knee is stable to ligamentous exam.  Neurovascularly intact distally.      Assessment & Plan:   Right shoulder pain secondary to rotator cuff impingement/tendinopathy Right knee pain likely secondary to DJD  Both the right subacromial space and right knee were injected with cortisone today.  Right knee was injected with an anterior medial approach.  She tolerates these  injections without difficulty.  If knee pain persist despite today's injection then the patient will notify me and I will start with getting an x-ray of her knee.  I also recommended that she try over-the-counter NSAIDs in lieu of Tylenol as needed.  She is warned about possible GI upset with these medications however.  She will follow-up with me for ongoing or recalcitrant issues  Consent obtained and verified. Time-out conducted. Noted no overlying erythema, induration, or other signs of local infection. Skin prepped in a sterile fashion. Topical analgesic spray: Ethyl chloride. Joint: Right shoulder, subacromial Needle: 25-gauge 1.5 inch Completed without difficulty. Meds: 3 cc 1% Xylocaine, 1 cc (40 mg) Depo-Medrol  Consent obtained and verified. Time-out conducted. Noted no overlying erythema, induration, or other signs of local infection. Skin prepped in a sterile fashion. Topical analgesic spray: Ethyl chloride. Joint: Right knee Needle: 25-gauge 1.5 inch Completed without difficulty. Meds: 3 cc 1% Xylocaine, 1 cc (40 mg) Depo-Medrol  This note was dictated using Dragon naturally speaking software and may contain errors in syntax, spelling, or content which have not been identified prior to signing this note.

## 2022-02-05 ENCOUNTER — Ambulatory Visit (INDEPENDENT_AMBULATORY_CARE_PROVIDER_SITE_OTHER): Payer: Commercial Managed Care - HMO | Admitting: Family Medicine

## 2022-02-05 ENCOUNTER — Ambulatory Visit
Admission: RE | Admit: 2022-02-05 | Discharge: 2022-02-05 | Disposition: A | Discharge status: Home / Self Care | Payer: Commercial Managed Care - HMO | Source: Ambulatory Visit | Attending: Family Medicine | Admitting: Family Medicine

## 2022-02-05 VITALS — BP 146/67 | Ht 64.0 in | Wt 231.0 lb

## 2022-02-05 DIAGNOSIS — M25561 Pain in right knee: Secondary | ICD-10-CM

## 2022-02-05 MED ORDER — MELOXICAM 15 MG PO TABS: 15.0000 mg | ORAL_TABLET | Freq: Every day | ORAL | 1 refills | Status: DC

## 2022-02-05 NOTE — Patient Instructions (Signed)
I'm concerned you have a meniscus tear in your knee. Get x-rays after you leave today. We will call you with those results then go ahead with MRI. Take meloxicam 15mg  daily with food for pain and inflammation - don't take aleve or ibuprofen while on this. Continue with the knee sleeve, icing.

## 2022-02-05 NOTE — Progress Notes (Unsigned)
    SUBJECTIVE:   CHIEF COMPLAINT / HPI:   Patient is a 61 year old female who presents today for right knee pain. The patient she was walking yesterday when the locked and gave out on her. She has had chronic knee pain and was recently given a steroid injection 4 days ago for osteoarthritis in the right knee Patient report some relieve with the steroid short but knee pain came back since it gave out on her. She has done ibuprofen, Aleve and icing of the knee which has had mild relieve. Patient said she's never had a knee xray  PERTINENT  PMH / PSH: Reviewed  OBJECTIVE:   BP (!) 146/67   Ht 5\' 4"  (1.626 m)   Wt 231 lb (104.8 kg)   BMI 39.65 kg/m    Physical Exam General:  Alert, well appearing, NAD  Right knee -Nonirritable deformity, mild effusion -No tenderness on palpations -Full extension of the right knee, limited flexion up to 30 or 40 degrees -Negative Lachman, posterior and anterior drawer test.  Negative valgus and valgus test.   ASSESSMENT/PLAN:   Right knee pain Unclear cause of her knee pain. There is most likely an element of underlying arthritis of the right knee however she could also have possible meniscus tear. Will obtain knee x-ray to assess extent of arthritis and she will eventually need an MRI to assess for meniscus tear. Rx Meloxicam for antiinflammatory benefit and pain relieve   Alen Bleacher, MD [place]   {    This will disappear when note is signed, click to select method of visit    :1}

## 2022-02-06 ENCOUNTER — Encounter: Payer: Self-pay | Admitting: *Deleted

## 2022-02-06 ENCOUNTER — Encounter: Payer: Self-pay | Admitting: Family Medicine

## 2022-02-12 ENCOUNTER — Encounter: Payer: Self-pay | Admitting: Family Medicine

## 2022-02-12 DIAGNOSIS — B3731 Acute candidiasis of vulva and vagina: Secondary | ICD-10-CM

## 2022-02-12 MED ORDER — FLUCONAZOLE 150 MG PO TABS: 150.0000 mg | ORAL_TABLET | Freq: Once | ORAL | 0 refills | Status: DC

## 2022-02-13 MED ORDER — FLUCONAZOLE 150 MG PO TABS: 150.0000 mg | ORAL_TABLET | Freq: Once | ORAL | 0 refills | Status: AC

## 2022-02-13 NOTE — Telephone Encounter (Signed)
Called and canceled prescription at [facility]. Sent in to patient's requested pharmacy, [facility] on [address].   Sent patient mychart message with update.

## 2022-02-13 NOTE — Addendum Note (Signed)
Addended by: Talbot Grumbling on: 02/13/2022 03:49 PM   Modules accepted: Orders

## 2022-02-18 ENCOUNTER — Ambulatory Visit
Admission: RE | Admit: 2022-02-18 | Discharge: 2022-02-18 | Disposition: A | Discharge status: Home / Self Care | Payer: Commercial Managed Care - HMO | Source: Ambulatory Visit | Attending: Family Medicine | Admitting: Family Medicine

## 2022-02-18 DIAGNOSIS — M25561 Pain in right knee: Secondary | ICD-10-CM

## 2022-02-26 ENCOUNTER — Ambulatory Visit (INDEPENDENT_AMBULATORY_CARE_PROVIDER_SITE_OTHER): Payer: Commercial Managed Care - HMO | Admitting: Family Medicine

## 2022-02-26 ENCOUNTER — Encounter: Payer: Self-pay | Admitting: Family Medicine

## 2022-02-26 VITALS — BP 128/76 | Ht 64.0 in

## 2022-02-26 DIAGNOSIS — M25561 Pain in right knee: Secondary | ICD-10-CM

## 2022-02-27 ENCOUNTER — Encounter: Payer: Self-pay | Admitting: Family Medicine

## 2022-02-27 NOTE — Progress Notes (Signed)
Patient came in today to review MRI results and next steps.  She reports some improvement since last visit but still having discomfort in her knee.  MRI showed root tear of posterior horn of medial meniscus, mild thinning of articular cartilage patellofemoral and medial compartments.  Would expect if degenerative changes was main driver of her pain that injection would have helped more (and would be worse on MRI).  She is going to start physical therapy, continue meloxicam.  Consider surgery referral if not improving.

## 2022-03-02 IMAGING — CT CT ANGIO HEAD
1 of 7 series · 4 of 20 positions shown · IV contrast (iopamidol)
Comparison: Prior head CT and MRI brain from 12/05/2019

CLINICAL DATA: Episode of left-sided facial droop, abnormal speech
in [REDACTED]

EXAM:
CT ANGIOGRAPHY HEAD AND NECK
TECHNIQUE: Multidetector CT imaging of the head and neck was performed using
the standard protocol during bolus administration of intravenous
contrast. Multiplanar CT image reconstructions and MIPs were
obtained to evaluate the vascular anatomy. Carotid stenosis
measurements (when applicable) are obtained utilizing NASCET
criteria, using the distal internal carotid diameter as the
denominator.
CONTRAST:  75mL SFR7OX-9KA IOPAMIDOL (SFR7OX-9KA) INJECTION 76%

[Series 16: cta head & neck 1.00 hv48 s3 ax thin mips · axial · 0.39mm/px · z∈[-782,-569]mm · 4 of 356 slices shown]
[im 72/356  soft-tissue]
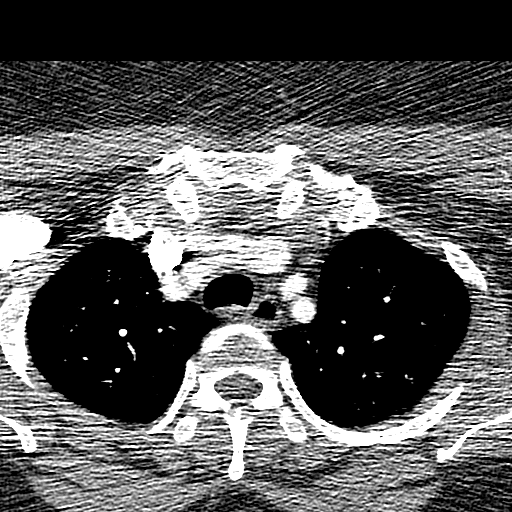
[im 143/356  bone]
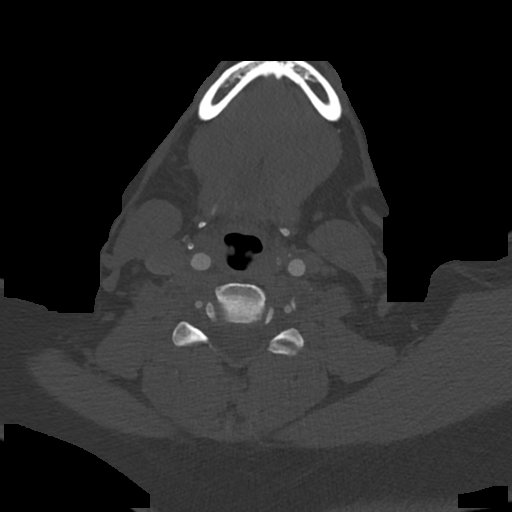
[im 214/356  soft-tissue]
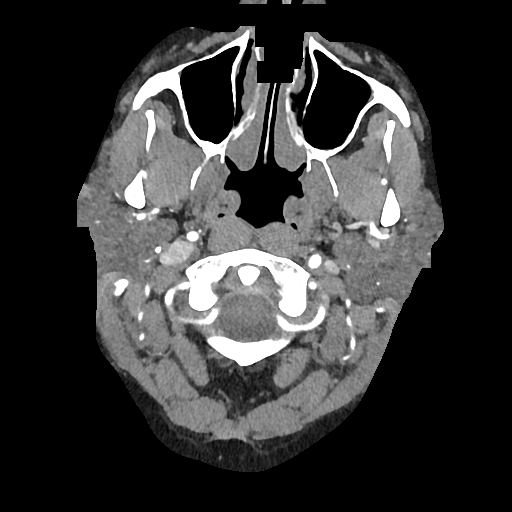
[im 285/356  bone]
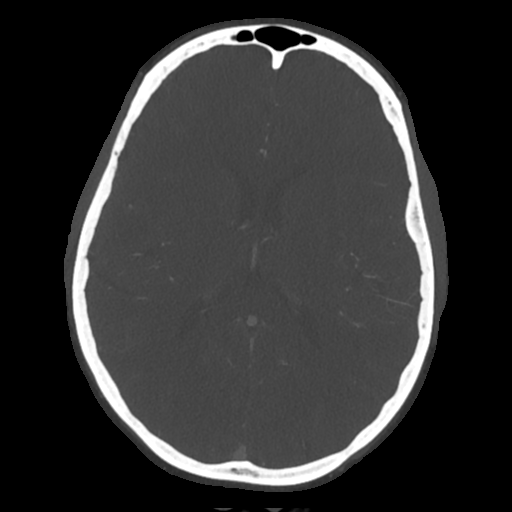

[4 of 20 positions shown; findings below may reference images not displayed]

FINDINGS: CT HEAD

Brain: There is no acute intracranial hemorrhage, mass effect, or
edema. Gray-white differentiation is preserved. There is no
extra-axial fluid collection. Ventricles and sulci are within normal
limits in size and configuration.

Vascular: No hyperdense vessel.

Skull: Calvarium is unremarkable.

Sinuses/Orbits: No acute finding.

Other: None.

Review of the MIP images confirms the above findings

CTA NECK

Aortic arch: Great vessel origins are patent.

Right carotid system: Patent. There is no measurable stenosis at the
ICA origin. Partially retropharyngeal course of the ICA.

Left carotid system: Patent. No measurable stenosis at the ICA
origin.

Vertebral arteries: Patent and codominant.

Skeleton: No significant abnormality.

Other neck: Negative.

Upper chest: No apical lung mass.

Review of the MIP images confirms the above findings

CTA HEAD

Anterior circulation: Intracranial internal carotid arteries are
patent. Anterior and middle cerebral arteries are patent.

Posterior circulation: Intracranial vertebral, basilar, and
posterior cerebral arteries are patent. There are bilateral
posterior communicating arteries. There is near fetal or fetal
origin of the left PCA.

Venous sinuses: Patent as allowed by contrast bolus timing.

Review of the MIP images confirms the above findings
IMPRESSION: No acute intracranial abnormality.

No hemodynamically significant stenosis or evidence of dissection.

## 2022-03-05 ENCOUNTER — Other Ambulatory Visit: Payer: Self-pay

## 2022-03-05 ENCOUNTER — Ambulatory Visit (INDEPENDENT_AMBULATORY_CARE_PROVIDER_SITE_OTHER): Payer: Commercial Managed Care - HMO | Admitting: Family Medicine

## 2022-03-05 ENCOUNTER — Encounter: Payer: Self-pay | Admitting: Family Medicine

## 2022-03-05 VITALS — BP 126/77 | HR 68 | Wt 237.2 lb

## 2022-03-05 DIAGNOSIS — L02419 Cutaneous abscess of limb, unspecified: Secondary | ICD-10-CM | POA: Diagnosis not present

## 2022-03-05 MED ORDER — DOXYCYCLINE HYCLATE 100 MG PO TABS: 100.0000 mg | ORAL_TABLET | Freq: Two times a day (BID) | ORAL | 0 refills | Status: DC

## 2022-03-05 MED ORDER — FLUCONAZOLE 150 MG PO TABS: 150.0000 mg | ORAL_TABLET | Freq: Once | ORAL | 0 refills | Status: AC

## 2022-03-05 NOTE — Progress Notes (Signed)
    SUBJECTIVE:   CHIEF COMPLAINT: spots under arms  HPI:   Veronica [name] is a 61 y.o.  with history notable for HTN and anxiety presenting for issues with underarms. She reports 2 weeks of having 'boils' under her right arm and now one under her left. Has tried Boil Ease with some relief. No fevers, drainage, breast symptoms (including masses, drainage, discharge). Has some pain from the lesions. Recently switched to Secret deodorant.   Medications reviewed today--is not taking Wegovy as has not heard about cost of this. She is interested in this for weight loss.   PERTINENT  PMH / PSH/Family/Social History : HTN  OBJECTIVE:   BP 126/77   Pulse 68   Wt 237 lb 3.2 oz (107.6 kg)   SpO2 100%   BMI 40.72 kg/m   Today's weight:  Last Weight  Most recent update: 03/05/2022  8:36 AM    Weight  107.6 kg (237 lb 3.2 oz)            Review of prior weights: Filed Weights   03/05/22 0835  Weight: 237 lb 3.2 oz (107.6 kg)     Cardiac: Warm well perfused.  Capillary refill less than 3 seconds Respiratory breathing comfortably on room air Psych: Pleasant normal affect, appropriate, normal rate of speech Bilateral breast exam Normal breast examination, no masses or discharge In R axilla there are 2 tender, hyperpigmented nodules (both <0.5 cm in total)  In L axillar there is 1 small hyperpigmented nodule  Chaperoned exam: CMA D Blount   ASSESSMENT/PLAN:   Axillary Abscesses Remote history of lesions,likely triggered by shaving and new product Rx doxycycline Lesions too small for I and D Has upcoming mammogram Return if not improving  History of yeast vaginitis Rx fluconazole for after antibiotics  Obesity, BMI elevated Will message about cost of Wegovy  HCM Mammogram on 11/30.        Dorris Singh, [place]

## 2022-03-05 NOTE — Patient Instructions (Signed)
It was wonderful to see you today.  Please bring ALL of your medications with you to every visit.   Today we talked about:   -You are doing great with your health - I do not recommend incision (cutting into) any of the abscesses today - I do recommend the following: -Warm compresses twice a day - Using a unscented cleansers - Start doxycycline 100 mg BID - I sent in Diflucan for a yeast infection    Please follow up in 3 months   Thank you for choosing [facility].   Please call [phone] with any questions about today's appointment.  Please be sure to schedule follow up at the front  desk before you leave today.   Dorris Singh, MD  Family Medicine

## 2022-03-06 NOTE — Therapy (Unsigned)
OUTPATIENT PHYSICAL THERAPY LOWER EXTREMITY EVALUATION   Patient Name: SONDI GRIBBON MRN: [id number] DOB:1960/10/20, 61 y.o., female Today's Date: 03/07/2022   PT End of Session - 03/07/22 0856     Visit Number 1    Number of Visits 12    Date for PT Re-Evaluation 04/18/22    Authorization Type [employer]    PT Start Time [phone]    Activity Tolerance Patient tolerated treatment well    Behavior During Therapy [facility] for tasks assessed/performed             Past Medical History:  Diagnosis Date   Daily headache    Fibromyalgia    G6PD deficiency    outside labs--low level, no longer on HCQ   Glucose 6 phosphatase deficiency (HCC)    Hypertension    Prediabetes    Rheumatoid arthritis (HCC)    SLE (systemic lupus erythematosus) (HCC) 1998   Past Surgical History:  Procedure Laterality Date   ABDOMINAL HYSTERECTOMY  09/2008   CESAREAN SECTION  1992, 1995   CHOLECYSTECTOMY N/A 07/18/2012   Procedure: LAPAROSCOPIC CHOLECYSTECTOMY WITH INTRAOPERATIVE CHOLANGIOGRAM;  Surgeon: Atilano Ina, MD;  [facility];  Service: General;  Laterality: N/A;   ERCP N/A 07/29/2012   Procedure: ENDOSCOPIC RETROGRADE CHOLANGIOPANCREATOGRAPHY (ERCP);  Surgeon: Theda Belfast, MD;  Location: [facility];  Service: Endoscopy;  Laterality: N/A;  Dr. Elnoria Howard said he would start this PT arond 1330( AW)   INCISION AND DRAINAGE Right 11/28/2015   "boil on upper arm; did this in dr's office"   INCISION AND DRAINAGE ABSCESS Right 12/02/2015   Procedure: INCISION AND DRAINAGE ABSCESS;  Surgeon: Jimmye Norman, MD;  Location: [facility] OR;  Service: General;  Laterality: Right;  Right axillary abscess   TUBAL LIGATION  1995   Patient Active Problem List   Diagnosis Date Noted   Esophageal dysphagia 04/15/2020   Mood disorder (HCC) 11/13/2019   Atherosclerosis of aorta (HCC) 07/15/2019   Prediabetes 07/13/2019   Chronic abdominal pain 07/13/2019   Insomnia 12/15/2012   Essential hypertension, benign 04/10/2012   SLE  (systemic lupus erythematosus) (HCC) 04/10/2012    PCP: [name] MD  REFERRING PROVIDER: Norton Blizzard MD   REFERRING DIAG: [phone] (ICD-10-CM) - Acute pain of right knee  THERAPY DIAG:  Acute pain of right knee - Plan: PT plan of care cert/re-cert  Difficulty in walking, not elsewhere classified - Plan: PT plan of care cert/re-cert  Localized edema - Plan: PT plan of care cert/re-cert  Rationale for Evaluation and Treatment: Rehabilitation  ONSET DATE: chronic   SUBJECTIVE:   SUBJECTIVE STATEMENT: Patient having diff walking stairs (at work) .    The longer I walk the better it gets.  The longer I sit the more stiff it is when I get up.  She can't get up and down from the floor easily.  She cannot lift heavy things from the floor.  She was previously walking regularly and for weight loss but she is nervous about how far she can go. Her Rt knee is swollen but does not fluctuate. She endorses weakness at times with walking.    PERTINENT HISTORY: Patient came in today to review MRI results and next steps.  She reports some improvement since last visit but still having discomfort in her knee.  MRI showed root tear of posterior horn of medial meniscus, mild thinning of articular cartilage patellofemoral and medial compartments.  Would expect if degenerative changes was main driver of her pain that injection  would have helped more (and would be worse on MRI).  She is going to start physical therapy, continue meloxicam.  Consider surgery referral if not improving.     PAIN:  Are you having pain? Yes: NPRS scale: 0/10 Pain location: Rt ant medial  Pain description: achy, dull  Aggravating factors: sit to stand  Relieving factors: rest, meds,  Pain goes to about 5/10-7/10. Wears a knee sleeve when needed.  PRECAUTIONS: None  WEIGHT BEARING RESTRICTIONS: No  FALLS:  Has patient fallen in last 6 months? 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   PLOF: Independent  PATIENT GOALS: Patient would like to avoid surgery.  Run a marathon.   NEXT MD VISIT:   OBJECTIVE:   DIAGNOSTIC FINDINGS:  Narrative & Impression  CLINICAL DATA:  Anterior knee pain with instability. No acute injury or prior relevant surgery.   EXAM: MRI OF THE RIGHT KNEE WITHOUT CONTRAST   TECHNIQUE: Multiplanar, multisequence MR imaging of the knee was performed. No intravenous contrast was administered.   COMPARISON:  Radiographs 02/05/2022.   FINDINGS: MENISCI   Medial meniscus: There is a root tear of the posterior horn of the medial meniscus with resulting partial extrusion of the medial meniscus peripherally from the joint. No centrally displaced meniscal fragments are seen.   Lateral meniscus:  Intact with normal morphology.   LIGAMENTS   Cruciates:  Intact.   Collaterals:  Intact.   CARTILAGE   Patellofemoral: Patellar cartilage evaluation mildly limited by motion on the axial images. There is chondral thinning and surface irregularity of the patellar cartilage over the apex and lateral facet. Mild medial trochlear involvement with associated subchondral cyst formation.   Medial:  Mild chondral thinning without focal defect.   Lateral:  Preserved.   MISCELLANEOUS   Joint:  Small joint effusion.   Popliteal Fossa: The popliteus muscle and tendon are intact. No significant Baker's cyst.   Extensor Mechanism: Intact. There is a patella alta with edema superolaterally in Hoffa's fat. The patellar retinacula appear intact.   Bones: No acute or significant osseous findings. Mild reactive edema in the lower pole of the patella. Reactive edema and cyst formation within the medial tibial intercondylar eminence,  attributed to the adjacent meniscal root tear.   Other: No other significant periarticular soft tissue findings.   IMPRESSION: 1. Root tear of the posterior horn of the medial meniscus with resulting partial extrusion of the meniscus peripherally from the joint. 2. Mild patellofemoral and medial compartment degenerative chondrosis. No acute osseous findings. 3. Patella alta with edema superolaterally in Hoffa's fat which may indicate patellar tendon-lateral femoral condyle friction syndrome. 4. The lateral meniscus, cruciate and collateral ligaments are intact.    PATIENT SURVEYS:  FOTO NT on eval   COGNITION: Overall cognitive status: Within functional limits for tasks assessed     SENSATION: WFL  EDEMA:  Circumferential: Rt 17 inch, Lt. 16 inch  MUSCLE LENGTH: Hamstrings: tight Thomas test: NT   POSTURE: No Significant postural limitations, mild genu valgus  PALPATION: Min TTP Rt medial joint line and posteriorly.  Discomfort with patellar mobs.   LOWER EXTREMITY ROM:  Active ROM Right eval Left eval  Hip flexion    Hip extension    Hip abduction  Hip adduction    Hip internal rotation    Hip external rotation    Knee flexion 126 132  Knee extension 4 0  Ankle dorsiflexion    Ankle plantarflexion    Ankle inversion    Ankle eversion     (Blank rows = not tested)  LOWER EXTREMITY MMT:  MMT Right eval Left eval  Hip flexion 4 4  Hip extension 4 4  Hip abduction 4 4  Hip adduction    Hip internal rotation    Hip external rotation    Knee flexion 5 5  Knee extension 5 5  Ankle dorsiflexion    Ankle plantarflexion    Ankle inversion    Ankle eversion     (Blank rows = not tested)  LOWER EXTREMITY SPECIAL TESTS:  NT  FUNCTIONAL TESTS:  5 times sit to stand: 19 sec  2 minute walk test: 440 feet, pain minimal at 2nd lap  GAIT: Distance walked: 150 Assistive device utilized: None Level of assistance: Complete Independence Comments:  very min limp, slower pace   TODAY'S TREATMENT:                                                                                                                              DATE: 03/07/22    PATIENT EDUCATION:  Education details: HEP, POC Person educated: Patient Education method: Programmer, multimedia, Facilities manager, Verbal cues, and Handouts Education comprehension: verbalized understanding and needs further education  HOME EXERCISE PROGRAM: Access Code: [email or web address] URL: [email or web address] Date: 03/07/2022 Prepared by: Karie Mainland  Exercises - Supine Active Straight Leg Raise  - 1 x daily - 7 x weekly - 2 sets - 10 reps - 5 hold - Straight Leg Raise with External Rotation  - 1 x daily - 7 x weekly - 2 sets - 10 reps - 5 hold - Sidelying Hip Abduction  - 1 x daily - 7 x weekly - 2 sets - 10 reps - 5 hold - Seated Hamstring Stretch  - 1 x daily - 7 x weekly - 1 sets - 3 reps - 30 hold  ASSESSMENT:  CLINICAL IMPRESSION: Patient is a 61 y.o. female who was seen today for physical therapy evaluation and treatment for Rt knee pain with confirmed degenerative meniscal tear.  Patient will only be able to come 2 weeks out of the month due to her work schedule.  OBJECTIVE IMPAIRMENTS: decreased mobility, difficulty walking, decreased ROM, decreased strength, increased edema, increased fascial restrictions, obesity, and pain.   ACTIVITY LIMITATIONS: lifting, bending, sitting, standing, squatting, stairs, and locomotion level  PARTICIPATION LIMITATIONS: cleaning, interpersonal relationship, shopping, community activity, and occupation  PERSONAL FACTORS: 1 comorbidity: obesity,also work schedule  are also affecting patient's functional outcome.   REHAB POTENTIAL: Excellent  CLINICAL DECISION MAKING: Stable/uncomplicated  EVALUATION COMPLEXITY: Low   GOALS: Goals reviewed with patient? Yes  SHORT TERM GOALS: Target date: 04/04/2022  Pt will be Independent home exercise  program Baseline: given  on eval  Goal status: INITIAL  2.  Pt will be able to resume walking routine with min increase in knee pain  Baseline:  Goal status: INITIAL  3.  Pt will verbalize 3 RICE, self care strategies for pain control Baseline:  Goal status: INITIAL   LONG TERM GOALS: Target date: 05/02/2022   Pt will complete HEP  Baseline:  Goal status: INITIAL  2.  Pt will demo 5/5 hip abduction and extension strength bilateral to optimize gait mechanics Baseline:  Goal status: INITIAL  3.  Pt will be able to sit for 30 minutes and no stiffness upon standing  Baseline: moderate  Goal status: INITIAL  4.  Patient will get up and down from the floor without difficulty Baseline:  Goal status: INITIAL  5.  Patient will return to doing stairs at work without increased pain or fear when not using rails Baseline:  Goal status: INITIAL  6.  Patient will perform 2-minute walk test without pain and ability to walk 500 feet.  Baseline:  Goal status: INITIAL   PLAN:  PT FREQUENCY: every other week 2 x each week when not working (4 visits per month)   PT DURATION: 8 weeks  PLANNED INTERVENTIONS: Therapeutic exercises, Therapeutic activity, Neuromuscular re-education, Balance training, Gait training, Patient/Family education, Self Care, Joint mobilization, Stair training, Cryotherapy, Moist heat, Taping, Manual therapy, and Re-evaluation  PLAN FOR NEXT SESSION: FOTO>  check HEP, bike vs TM, closed chain when able , step ups   Tinie Mcgloin, PT 03/07/2022, 9:52 AM

## 2022-03-07 ENCOUNTER — Ambulatory Visit: Payer: Commercial Managed Care - HMO | Attending: Family Medicine | Admitting: Physical Therapy

## 2022-03-07 ENCOUNTER — Encounter: Payer: Self-pay | Admitting: Physical Therapy

## 2022-03-07 DIAGNOSIS — M25561 Pain in right knee: Secondary | ICD-10-CM | POA: Diagnosis present

## 2022-03-07 DIAGNOSIS — R600 Localized edema: Secondary | ICD-10-CM | POA: Diagnosis present

## 2022-03-07 DIAGNOSIS — R262 Difficulty in walking, not elsewhere classified: Secondary | ICD-10-CM | POA: Diagnosis present

## 2022-03-14 NOTE — Therapy (Unsigned)
OUTPATIENT PHYSICAL THERAPY TREATMENT NOTE   Patient Name: Veronica [name] MRN: [id number] DOB:1960/11/13, 61 y.o., female Today's Date: 03/15/2022  PCP: Westley Chandler REFERRING PROVIDER: Dr. Norton Blizzard   END OF SESSION:   PT End of Session - 03/15/22 0853     Visit Number 2    Number of Visits 12    Date for PT Re-Evaluation 05/03/22    Authorization Type [employer]    PT Start Time [phone]    PT Stop Time 0930    PT Time Calculation (min) 38 min    Activity Tolerance Patient tolerated treatment well    Behavior During Therapy WFL for tasks assessed/performed             Past Medical History:  Diagnosis Date   Daily headache    Fibromyalgia    G6PD deficiency    outside labs--low level, no longer on HCQ   Glucose 6 phosphatase deficiency (HCC)    Hypertension    Prediabetes    Rheumatoid arthritis (HCC)    SLE (systemic lupus erythematosus) (HCC) 1998   Past Surgical History:  Procedure Laterality Date   ABDOMINAL HYSTERECTOMY  09/2008   CESAREAN SECTION  1992, 1995   CHOLECYSTECTOMY N/A 07/18/2012   Procedure: LAPAROSCOPIC CHOLECYSTECTOMY WITH INTRAOPERATIVE CHOLANGIOGRAM;  Surgeon: Atilano Ina, MD;  Location: [facility] OR;  Service: General;  Laterality: N/A;   ERCP N/A 07/29/2012   Procedure: ENDOSCOPIC RETROGRADE CHOLANGIOPANCREATOGRAPHY (ERCP);  Surgeon: Theda Belfast, MD;  Location: [facility];  Service: Endoscopy;  Laterality: N/A;  Dr. Elnoria Howard said he would start this PT arond 1330( AW)   INCISION AND DRAINAGE Right 11/28/2015   "boil on upper arm; did this in dr's office"   INCISION AND DRAINAGE ABSCESS Right 12/02/2015   Procedure: INCISION AND DRAINAGE ABSCESS;  Surgeon: Jimmye Norman, MD;  Location: [facility] OR;  Service: General;  Laterality: Right;  Right axillary abscess   TUBAL LIGATION  1995   Patient Active Problem List   Diagnosis Date Noted   Esophageal dysphagia 04/15/2020   Mood disorder (HCC) 11/13/2019   Atherosclerosis of aorta (HCC) 07/15/2019    Prediabetes 07/13/2019   Chronic abdominal pain 07/13/2019   Insomnia 12/15/2012   Essential hypertension, benign 04/10/2012   SLE (systemic lupus erythematosus) (HCC) 04/10/2012    REFERRING DIAG: Knee pain , Rt   THERAPY DIAG:  Acute pain of right knee  Difficulty in walking, not elsewhere classified  Localized edema  Rationale for Evaluation and Treatment Rehabilitation  PERTINENT HISTORY: see above   PRECAUTIONS: none   SUBJECTIVE:  SUBJECTIVE STATEMENT:  I had soreness after the eval.  I did not do the exercises.    PAIN:  Are you having pain? Yes: NPRS scale: 5/10 Pain location: Inner Rt knee  Pain description: sore  Aggravating factors: standing, walking  Relieving factors: rest   OBJECTIVE: (objective measures completed at initial evaluation unless otherwise dated)  DIAGNOSTIC FINDINGS:  Narrative & Impression  CLINICAL DATA:  Anterior knee pain with instability. No acute injury or prior relevant surgery.   EXAM: MRI OF THE RIGHT KNEE WITHOUT CONTRAST   TECHNIQUE: Multiplanar, multisequence MR imaging of the knee was performed. No intravenous contrast was administered.   COMPARISON:  Radiographs 02/05/2022.   FINDINGS: MENISCI   Medial meniscus: There is a root tear of the posterior horn of the medial meniscus with resulting partial extrusion of the medial meniscus peripherally from the joint. No centrally displaced meniscal fragments are seen.   Lateral meniscus:  Intact with normal morphology.   LIGAMENTS   Cruciates:  Intact.   Collaterals:  Intact.   CARTILAGE   Patellofemoral: Patellar cartilage evaluation mildly limited by motion on the axial images. There is chondral thinning and surface irregularity of the patellar cartilage over the apex and  lateral facet. Mild medial trochlear involvement with associated subchondral cyst formation.   Medial:  Mild chondral thinning without focal defect.   Lateral:  Preserved.   MISCELLANEOUS   Joint:  Small joint effusion.   Popliteal Fossa: The popliteus muscle and tendon are intact. No significant Baker's cyst.   Extensor Mechanism: Intact. There is a patella alta with edema superolaterally in Hoffa's fat. The patellar retinacula appear intact.   Bones: No acute or significant osseous findings. Mild reactive edema in the lower pole of the patella. Reactive edema and cyst formation within the medial tibial intercondylar eminence, attributed to the adjacent meniscal root tear.   Other: No other significant periarticular soft tissue findings.   IMPRESSION: 1. Root tear of the posterior horn of the medial meniscus with resulting partial extrusion of the meniscus peripherally from the joint. 2. Mild patellofemoral and medial compartment degenerative chondrosis. No acute osseous findings. 3. Patella alta with edema superolaterally in Hoffa's fat which may indicate patellar tendon-lateral femoral condyle friction syndrome. 4. The lateral meniscus, cruciate and collateral ligaments are intact.      PATIENT SURVEYS:  FOTO 42%, goal is 56%   COGNITION: Overall cognitive status: Within functional limits for tasks assessed                         SENSATION: WFL   EDEMA:  Circumferential: Rt 17 inch, Lt. 16 inch   MUSCLE LENGTH: Hamstrings: tight Thomas test: NT    POSTURE: No Significant postural limitations, mild genu valgus   PALPATION: Min TTP Rt medial joint line and posteriorly.  Discomfort with patellar mobs.    LOWER EXTREMITY ROM:   Active ROM Right eval Left eval  Hip flexion      Hip extension      Hip abduction      Hip adduction      Hip internal rotation      Hip external rotation      Knee flexion 126 132  Knee extension 4 0  Ankle dorsiflexion       Ankle plantarflexion      Ankle inversion      Ankle eversion       (Blank rows = not tested)   LOWER EXTREMITY  MMT:   MMT Right eval Left eval  Hip flexion 4 4  Hip extension 4 4  Hip abduction 4 4  Hip adduction      Hip internal rotation      Hip external rotation      Knee flexion 5 5  Knee extension 5 5  Ankle dorsiflexion      Ankle plantarflexion      Ankle inversion      Ankle eversion       (Blank rows = not tested)   LOWER EXTREMITY SPECIAL TESTS:  NT   FUNCTIONAL TESTS:  5 times sit to stand: 19 sec  2 minute walk test: 440 feet, pain minimal at 2nd lap   GAIT: Distance walked: 150 Assistive device utilized: None Level of assistance: Complete Independence Comments: very min limp, slower pace     TODAY'S TREATMENT:                                                                                                                               [facility] Adult PT Treatment:                                                DATE: 03/15/22 Therapeutic Exercise: Nustep LE and UE for 6 min L5 warm up Supine hamstring stretch with strap SLR x 15 toes up, then toes out  Knee extension with ball squeeze  Bridge x 10 Sidelying hip abduction 2 x 15 each LE  Sit to stand with 10 lbs x 10, cues  Self Care: FOTO , discussed goals Importance of HEP     PATIENT EDUCATION:  Education details: HEP, POC Person educated: Patient Education method: Programmer, multimedia, Demonstration, Verbal cues, and Handouts Education comprehension: verbalized understanding and needs further education   HOME EXERCISE PROGRAM: Access Code: [email or web address] URL: [email or web address] Date: 03/07/2022 Prepared by: Karie Mainland   Exercises - Supine Active Straight Leg Raise  - 1 x daily - 7 x weekly - 2 sets - 10 reps - 5 hold - Straight Leg Raise with External Rotation  - 1 x daily - 7 x weekly - 2 sets - 10 reps - 5 hold - Sidelying Hip Abduction  - 1 x daily - 7 x weekly - 2 sets - 10  reps - 5 hold - Seated Hamstring Stretch  - 1 x daily - 7 x weekly - 1 sets - 3 reps - 30 hold   ASSESSMENT:   CLINICAL IMPRESSION: Patient tolerated session well, no increased pain .  Encouraged her to try her HEP and walk before her next visit.     OBJECTIVE IMPAIRMENTS: decreased mobility, difficulty walking, decreased ROM, decreased strength, increased edema, increased fascial restrictions, obesity, and pain.    ACTIVITY LIMITATIONS: lifting, bending, sitting, standing, squatting, stairs, and locomotion level  PARTICIPATION LIMITATIONS: cleaning, interpersonal relationship, shopping, community activity, and occupation   PERSONAL FACTORS: 1 comorbidity: obesity,also work schedule  are also affecting patient's functional outcome.    REHAB POTENTIAL: Excellent   CLINICAL DECISION MAKING: Stable/uncomplicated   EVALUATION COMPLEXITY: Low     GOALS: Goals reviewed with patient? Yes   SHORT TERM GOALS: Target date: 04/04/2022  Pt will be Independent home exercise program Baseline: given on eval  Goal status: INITIAL   2.  Pt will be able to resume walking routine with min increase in knee pain  Baseline:  Goal status: INITIAL   3.  Pt will verbalize 3 RICE, self care strategies for pain control Baseline:  Goal status: INITIAL     LONG TERM GOALS: Target date: 05/02/2022    Pt will complete HEP  Baseline:  Goal status: INITIAL   2.  Pt will demo 5/5 hip abduction and extension strength bilateral to optimize gait mechanics Baseline:  Goal status: INITIAL   3.  Pt will be able to sit for 30 minutes and no stiffness upon standing   Baseline: moderate  Goal status: INITIAL   4.  Patient will get up and down from the floor without difficulty Baseline:  Goal status: INITIAL   5.  Patient will return to doing stairs at work without increased pain or fear when not using rails Baseline:  Goal status: INITIAL   6.  Patient will perform 2-minute walk test without pain  and ability to walk 500 feet.  Baseline:  Goal status: INITIAL     PLAN:   PT FREQUENCY: every other week 2 x each week when not working (4 visits per month)    PT DURATION: 8 weeks   PLANNED INTERVENTIONS: Therapeutic exercises, Therapeutic activity, Neuromuscular re-education, Balance training, Gait training, Patient/Family education, Self Care, Joint mobilization, Stair training, Cryotherapy, Moist heat, Taping, Manual therapy, and Re-evaluation   PLAN FOR NEXT SESSION:  check HEP, bike vs TM, closed chain when able , step ups    Idania Desouza, PT 03/15/2022, 9:37 AM    Karie Mainland, PT 03/15/22 9:52 AM Phone: [phone] Fax: [phone]

## 2022-03-15 ENCOUNTER — Ambulatory Visit: Payer: Commercial Managed Care - HMO | Admitting: Physical Therapy

## 2022-03-15 ENCOUNTER — Encounter: Payer: Self-pay | Admitting: Physical Therapy

## 2022-03-15 DIAGNOSIS — M25561 Pain in right knee: Secondary | ICD-10-CM

## 2022-03-15 DIAGNOSIS — R262 Difficulty in walking, not elsewhere classified: Secondary | ICD-10-CM

## 2022-03-15 DIAGNOSIS — R600 Localized edema: Secondary | ICD-10-CM

## 2022-03-16 NOTE — Therapy (Deleted)
OUTPATIENT PHYSICAL THERAPY TREATMENT NOTE   Patient Name: Veronica [name] MRN: [id number] DOB:05-18-1960, 61 y.o., female Today's Date: 03/16/2022  PCP: Westley Chandler REFERRING PROVIDER: Dr. Norton Blizzard   END OF SESSION:     Past Medical History:  Diagnosis Date   Daily headache    Fibromyalgia    G6PD deficiency    outside labs--low level, no longer on HCQ   Glucose 6 phosphatase deficiency (HCC)    Hypertension    Prediabetes    Rheumatoid arthritis (HCC)    SLE (systemic lupus erythematosus) (HCC) 1998   Past Surgical History:  Procedure Laterality Date   ABDOMINAL HYSTERECTOMY  09/2008   CESAREAN SECTION  1992, 1995   CHOLECYSTECTOMY N/A 07/18/2012   Procedure: LAPAROSCOPIC CHOLECYSTECTOMY WITH INTRAOPERATIVE CHOLANGIOGRAM;  Surgeon: Atilano Ina, MD;  Location: [facility] OR;  Service: General;  Laterality: N/A;   ERCP N/A 07/29/2012   Procedure: ENDOSCOPIC RETROGRADE CHOLANGIOPANCREATOGRAPHY (ERCP);  Surgeon: Theda Belfast, MD;  Location: [facility];  Service: Endoscopy;  Laterality: N/A;  Dr. Elnoria Howard said he would start this PT arond 1330( AW)   INCISION AND DRAINAGE Right 11/28/2015   "boil on upper arm; did this in dr's office"   INCISION AND DRAINAGE ABSCESS Right 12/02/2015   Procedure: INCISION AND DRAINAGE ABSCESS;  Surgeon: Jimmye Norman, MD;  Location: [facility] OR;  Service: General;  Laterality: Right;  Right axillary abscess   TUBAL LIGATION  1995   Patient Active Problem List   Diagnosis Date Noted   Esophageal dysphagia 04/15/2020   Mood disorder (HCC) 11/13/2019   Atherosclerosis of aorta (HCC) 07/15/2019   Prediabetes 07/13/2019   Chronic abdominal pain 07/13/2019   Insomnia 12/15/2012   Essential hypertension, benign 04/10/2012   SLE (systemic lupus erythematosus) (HCC) 04/10/2012    REFERRING DIAG: Knee pain , Rt   THERAPY DIAG:  No diagnosis found.  Rationale for Evaluation and Treatment Rehabilitation  PERTINENT HISTORY: see above   PRECAUTIONS:  none   SUBJECTIVE:                                                                                                                                                                                      SUBJECTIVE STATEMENT:  I had soreness after the eval.  I did not do the exercises.    PAIN:  Are you having pain? Yes: NPRS scale: 5/10 Pain location: Inner Rt knee  Pain description: sore  Aggravating factors: standing, walking  Relieving factors: rest   OBJECTIVE: (objective measures completed at initial evaluation unless otherwise dated)  DIAGNOSTIC FINDINGS:  Narrative & Impression  CLINICAL DATA:  Anterior knee pain with instability. No acute  injury or prior relevant surgery.   EXAM: MRI OF THE RIGHT KNEE WITHOUT CONTRAST   TECHNIQUE: Multiplanar, multisequence MR imaging of the knee was performed. No intravenous contrast was administered.   COMPARISON:  Radiographs 02/05/2022.   FINDINGS: MENISCI   Medial meniscus: There is a root tear of the posterior horn of the medial meniscus with resulting partial extrusion of the medial meniscus peripherally from the joint. No centrally displaced meniscal fragments are seen.   Lateral meniscus:  Intact with normal morphology.   LIGAMENTS   Cruciates:  Intact.   Collaterals:  Intact.   CARTILAGE   Patellofemoral: Patellar cartilage evaluation mildly limited by motion on the axial images. There is chondral thinning and surface irregularity of the patellar cartilage over the apex and lateral facet. Mild medial trochlear involvement with associated subchondral cyst formation.   Medial:  Mild chondral thinning without focal defect.   Lateral:  Preserved.   MISCELLANEOUS   Joint:  Small joint effusion.   Popliteal Fossa: The popliteus muscle and tendon are intact. No significant Baker's cyst.   Extensor Mechanism: Intact. There is a patella alta with edema superolaterally in Hoffa's fat. The patellar retinacula  appear intact.   Bones: No acute or significant osseous findings. Mild reactive edema in the lower pole of the patella. Reactive edema and cyst formation within the medial tibial intercondylar eminence, attributed to the adjacent meniscal root tear.   Other: No other significant periarticular soft tissue findings.   IMPRESSION: 1. Root tear of the posterior horn of the medial meniscus with resulting partial extrusion of the meniscus peripherally from the joint. 2. Mild patellofemoral and medial compartment degenerative chondrosis. No acute osseous findings. 3. Patella alta with edema superolaterally in Hoffa's fat which may indicate patellar tendon-lateral femoral condyle friction syndrome. 4. The lateral meniscus, cruciate and collateral ligaments are intact.      PATIENT SURVEYS:  FOTO 42%, goal is 56%   COGNITION: Overall cognitive status: Within functional limits for tasks assessed                         SENSATION: WFL   EDEMA:  Circumferential: Rt 17 inch, Lt. 16 inch   MUSCLE LENGTH: Hamstrings: tight Thomas test: NT    POSTURE: No Significant postural limitations, mild genu valgus   PALPATION: Min TTP Rt medial joint line and posteriorly.  Discomfort with patellar mobs.    LOWER EXTREMITY ROM:   Active ROM Right eval Left eval  Hip flexion      Hip extension      Hip abduction      Hip adduction      Hip internal rotation      Hip external rotation      Knee flexion 126 132  Knee extension 4 0  Ankle dorsiflexion      Ankle plantarflexion      Ankle inversion      Ankle eversion       (Blank rows = not tested)   LOWER EXTREMITY MMT:   MMT Right eval Left eval  Hip flexion 4 4  Hip extension 4 4  Hip abduction 4 4  Hip adduction      Hip internal rotation      Hip external rotation      Knee flexion 5 5  Knee extension 5 5  Ankle dorsiflexion      Ankle plantarflexion      Ankle inversion  Ankle eversion       (Blank rows = not  tested)   LOWER EXTREMITY SPECIAL TESTS:  NT   FUNCTIONAL TESTS:  5 times sit to stand: 19 sec  2 minute walk test: 440 feet, pain minimal at 2nd lap   GAIT: Distance walked: 150 Assistive device utilized: None Level of assistance: Complete Independence Comments: very min limp, slower pace     TODAY'S TREATMENT:                                                                                                                              [facility] Adult PT Treatment:                                                DATE: 03/19/22 Therapeutic Exercise: *** Manual Therapy: *** Neuromuscular re-ed: *** Therapeutic Activity: *** Modalities: *** Self Care: ***  [facility] Adult PT Treatment:                                                DATE: 03/15/22 Therapeutic Exercise: Nustep LE and UE for 6 min L5 warm up Supine hamstring stretch with strap SLR x 15 toes up, then toes out  Knee extension with ball squeeze  Bridge x 10 Sidelying hip abduction 2 x 15 each LE  Sit to stand with 10 lbs x 10, cues  Self Care: FOTO , discussed goals Importance of HEP     PATIENT EDUCATION:  Education details: HEP, POC Person educated: Patient Education method: Programmer, multimedia, Demonstration, Verbal cues, and Handouts Education comprehension: verbalized understanding and needs further education   HOME EXERCISE PROGRAM: Access Code: [email or web address] URL: [email or web address] Date: 03/07/2022 Prepared by: Karie Mainland   Exercises - Supine Active Straight Leg Raise  - 1 x daily - 7 x weekly - 2 sets - 10 reps - 5 hold - Straight Leg Raise with External Rotation  - 1 x daily - 7 x weekly - 2 sets - 10 reps - 5 hold - Sidelying Hip Abduction  - 1 x daily - 7 x weekly - 2 sets - 10 reps - 5 hold - Seated Hamstring Stretch  - 1 x daily - 7 x weekly - 1 sets - 3 reps - 30 hold   ASSESSMENT:   CLINICAL IMPRESSION: Patient tolerated session well, no increased pain .  Encouraged her to try her HEP  and walk before her next visit.     OBJECTIVE IMPAIRMENTS: decreased mobility, difficulty walking, decreased ROM, decreased strength, increased edema, increased fascial restrictions, obesity, and pain.    ACTIVITY LIMITATIONS: lifting, bending, sitting, standing, squatting, stairs, and locomotion level   PARTICIPATION LIMITATIONS: cleaning, interpersonal relationship, shopping,  community activity, and occupation   PERSONAL FACTORS: 1 comorbidity: obesity,also work schedule  are also affecting patient's functional outcome.    REHAB POTENTIAL: Excellent   CLINICAL DECISION MAKING: Stable/uncomplicated   EVALUATION COMPLEXITY: Low     GOALS: Goals reviewed with patient? Yes   SHORT TERM GOALS: Target date: 04/04/2022  Pt will be Independent home exercise program Baseline: given on eval  Goal status: INITIAL   2.  Pt will be able to resume walking routine with min increase in knee pain  Baseline:  Goal status: INITIAL   3.  Pt will verbalize 3 RICE, self care strategies for pain control Baseline:  Goal status: INITIAL     LONG TERM GOALS: Target date: 05/02/2022    Pt will complete HEP  Baseline:  Goal status: INITIAL   2.  Pt will demo 5/5 hip abduction and extension strength bilateral to optimize gait mechanics Baseline:  Goal status: INITIAL   3.  Pt will be able to sit for 30 minutes and no stiffness upon standing   Baseline: moderate  Goal status: INITIAL   4.  Patient will get up and down from the floor without difficulty Baseline:  Goal status: INITIAL   5.  Patient will return to doing stairs at work without increased pain or fear when not using rails Baseline:  Goal status: INITIAL   6.  Patient will perform 2-minute walk test without pain and ability to walk 500 feet.  Baseline:  Goal status: INITIAL     PLAN:   PT FREQUENCY: every other week 2 x each week when not working (4 visits per month)    PT DURATION: 8 weeks   PLANNED INTERVENTIONS:  Therapeutic exercises, Therapeutic activity, Neuromuscular re-education, Balance training, Gait training, Patient/Family education, Self Care, Joint mobilization, Stair training, Cryotherapy, Moist heat, Taping, Manual therapy, and Re-evaluation   PLAN FOR NEXT SESSION:  check HEP, bike vs TM, closed chain when able , step ups    Anmol Paschen, PT 03/16/2022, 10:01 AM    Karie Mainland, PT 03/16/22 10:01 AM Phone: [phone] Fax: [phone]

## 2022-03-19 ENCOUNTER — Ambulatory Visit: Payer: Commercial Managed Care - HMO | Admitting: Physical Therapy

## 2022-03-27 NOTE — Therapy (Unsigned)
OUTPATIENT PHYSICAL THERAPY TREATMENT NOTE   Patient Name: Veronica [name] MRN: [id number] DOB:Aug 27, 1960, 61 y.o., female Today's Date: 03/28/2022  PCP: Westley Chandler REFERRING PROVIDER: Dr. Norton Blizzard   END OF SESSION:   PT End of Session - 03/28/22 1110     Visit Number 3    Number of Visits 12    Date for PT Re-Evaluation 05/03/22    Authorization Type [employer]    PT Start Time 1108    PT Stop Time 1149    PT Time Calculation (min) 41 min    Activity Tolerance Patient tolerated treatment well    Behavior During Therapy WFL for tasks assessed/performed              Past Medical History:  Diagnosis Date   Daily headache    Fibromyalgia    G6PD deficiency    outside labs--low level, no longer on HCQ   Glucose 6 phosphatase deficiency (HCC)    Hypertension    Prediabetes    Rheumatoid arthritis (HCC)    SLE (systemic lupus erythematosus) (HCC) 1998   Past Surgical History:  Procedure Laterality Date   ABDOMINAL HYSTERECTOMY  09/2008   CESAREAN SECTION  1992, 1995   CHOLECYSTECTOMY N/A 07/18/2012   Procedure: LAPAROSCOPIC CHOLECYSTECTOMY WITH INTRAOPERATIVE CHOLANGIOGRAM;  Surgeon: Atilano Ina, MD;  Location: [facility] OR;  Service: General;  Laterality: N/A;   ERCP N/A 07/29/2012   Procedure: ENDOSCOPIC RETROGRADE CHOLANGIOPANCREATOGRAPHY (ERCP);  Surgeon: Theda Belfast, MD;  Location: [facility];  Service: Endoscopy;  Laterality: N/A;  Dr. Elnoria Howard said he would start this PT arond 1330( AW)   INCISION AND DRAINAGE Right 11/28/2015   "boil on upper arm; did this in dr's office"   INCISION AND DRAINAGE ABSCESS Right 12/02/2015   Procedure: INCISION AND DRAINAGE ABSCESS;  Surgeon: Jimmye Norman, MD;  Location: [facility] OR;  Service: General;  Laterality: Right;  Right axillary abscess   TUBAL LIGATION  1995   Patient Active Problem List   Diagnosis Date Noted   Esophageal dysphagia 04/15/2020   Mood disorder (HCC) 11/13/2019   Atherosclerosis of aorta (HCC) 07/15/2019    Prediabetes 07/13/2019   Chronic abdominal pain 07/13/2019   Insomnia 12/15/2012   Essential hypertension, benign 04/10/2012   SLE (systemic lupus erythematosus) (HCC) 04/10/2012    REFERRING DIAG: Knee pain , Rt   THERAPY DIAG:  Acute pain of right knee  Difficulty in walking, not elsewhere classified  Localized edema  Rationale for Evaluation and Treatment Rehabilitation  PERTINENT HISTORY: see above   PRECAUTIONS: none   SUBJECTIVE:  SUBJECTIVE STATEMENT:  My husband and I have been walking.  When I get home I don't think about it and its hard to do at work/     PAIN:  Are you having pain? Yes: NPRS scale: 0/10 Pain location: Inner Rt knee  Pain description: sore  Aggravating factors: standing, walking  Relieving factors: rest   OBJECTIVE:   PATIENT SURVEYS:  FOTO 42%, goal is 56%   COGNITION: Overall cognitive status: Within functional limits for tasks assessed                         SENSATION: WFL   EDEMA:  Circumferential: Rt 17 inch, Lt. 16 inch   MUSCLE LENGTH: Hamstrings: tight Thomas test: NT    POSTURE: No Significant postural limitations, mild genu valgus   PALPATION: Min TTP Rt medial joint line and posteriorly.  Discomfort with patellar mobs.    LOWER EXTREMITY ROM:   Active ROM Right eval Left eval  Hip flexion      Hip extension      Hip abduction      Hip adduction      Hip internal rotation      Hip external rotation      Knee flexion 126 132  Knee extension 4 0  Ankle dorsiflexion      Ankle plantarflexion      Ankle inversion      Ankle eversion       (Blank rows = not tested)   LOWER EXTREMITY MMT:   MMT Right eval Left eval  Hip flexion 4 4  Hip extension 4 4  Hip abduction 4 4  Hip adduction      Hip internal rotation      Hip  external rotation      Knee flexion 5 5  Knee extension 5 5  Ankle dorsiflexion      Ankle plantarflexion      Ankle inversion      Ankle eversion       (Blank rows = not tested)   LOWER EXTREMITY SPECIAL TESTS:  NT   FUNCTIONAL TESTS:  5 times sit to stand: 19 sec  2 minute walk test: 440 feet, pain minimal at 2nd lap   GAIT: Distance walked: 150 Assistive device utilized: None Level of assistance: Complete Independence Comments: very min limp, slower pace     TODAY'S TREATMENT:                                                                                                                              [facility] Adult PT Treatment:                                                DATE: 03/27/22 Therapeutic Exercise: Standing hip abduction  2  x 15 each (added red loop 2nd set)  Squat wide x 10 at counter  Heel raise x 20  Step ups x 15 , 6 inch  Wall sit 5 x 10 sec isometric then full ROM x 10  LAQ 5 lbs x 15 each side  Bridge 2 x 10  SLR  x 15 each side  Sidelying x 10 hip ER Sidelying green band clam x 15  NuStep 6 min L7 Ue and LE  Self Care: Strengthening vs activity, POC     [facility] Adult PT Treatment:                                                DATE: 03/15/22 Therapeutic Exercise: Nustep LE and UE for 6 min L5 warm up Supine hamstring stretch with strap SLR x 15 toes up, then toes out  Knee extension with ball squeeze  Bridge x 10 Sidelying hip abduction 2 x 15 each LE  Sit to stand with 10 lbs x 10, cues  Self Care: FOTO , discussed goals Importance of HEP     PATIENT EDUCATION:  Education details: HEP, POC Person educated: Patient Education method: Programmer, multimedia, Demonstration, Verbal cues, and Handouts Education comprehension: verbalized understanding and needs further education   HOME EXERCISE PROGRAM: Access Code: [email or web address] URL: [email or web address] Date: 03/07/2022 Prepared by: Karie Mainland     ASSESSMENT:   CLINICAL  IMPRESSION: Patient tolerated session well, no increased pain .  She has been walking with her husband and stretching but not doing much for strengthening.  Her knee pain has been very minimal since last week.  She would still like to continue PT and ensure her knee is stable and secure for exercise. 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 Is off next week so she will come in x 2.     OBJECTIVE IMPAIRMENTS: decreased mobility, difficulty walking, decreased ROM, decreased strength, increased edema, increased fascial restrictions, obesity, and pain.    ACTIVITY LIMITATIONS: lifting, bending, sitting, standing, squatting, stairs, and locomotion level   PARTICIPATION LIMITATIONS: cleaning, interpersonal relationship, shopping, community activity, and occupation   PERSONAL FACTORS: 1 comorbidity: obesity,also work schedule  are also affecting patient's functional outcome.    REHAB POTENTIAL: Excellent   CLINICAL DECISION MAKING: Stable/uncomplicated   EVALUATION COMPLEXITY: Low     GOALS: Goals reviewed with patient? Yes   SHORT TERM GOALS: Target date: 04/04/2022  Pt will be Independent home exercise program Baseline: given on eval  Goal status: INITIAL   2.  Pt will be able to resume walking routine with min increase in knee pain  Baseline:  Goal status: INITIAL   3.  Pt will verbalize 3 RICE, self care strategies for pain control Baseline:  Goal status: INITIAL     LONG TERM GOALS: Target date: 05/02/2022    Pt will complete HEP  Baseline:  Goal status: INITIAL   2.  Pt will demo 5/5 hip abduction and extension strength bilateral to optimize gait mechanics Baseline:  Goal status: INITIAL   3.  Pt will be able to sit for 30 minutes and no stiffness upon standing   Baseline: moderate  Goal status: INITIAL   4.  Patient will get up and down from the floor without difficulty Baseline:  Goal status: INITIAL   5.  Patient  will return to  doing stairs at work without increased pain or fear when not using rails Baseline:  Goal status: INITIAL   6.  Patient will perform 2-minute walk test without pain and ability to walk 500 feet.  Baseline:  Goal status: INITIAL     PLAN:   PT FREQUENCY: every other week 2 x each week when not working (4 visits per month)    PT DURATION: 8 weeks   PLANNED INTERVENTIONS: Therapeutic exercises, Therapeutic activity, Neuromuscular re-education, Balance training, Gait training, Patient/Family education, Self Care, Joint mobilization, Stair training, Cryotherapy, Moist heat, Taping, Manual therapy, and Re-evaluation   PLAN FOR NEXT SESSION:  goals. check HEP, bike vs TM, closed chain when able , step ups    Ilian Wessell, PT 03/28/2022, 11:40 AM    Karie Mainland, PT 03/28/22 11:40 AM Phone: [phone] Fax: [phone]

## 2022-03-28 ENCOUNTER — Other Ambulatory Visit: Payer: Self-pay | Admitting: Family Medicine

## 2022-03-28 ENCOUNTER — Ambulatory Visit: Payer: Commercial Managed Care - HMO | Admitting: Physical Therapy

## 2022-03-28 ENCOUNTER — Encounter: Payer: Self-pay | Admitting: Physical Therapy

## 2022-03-28 DIAGNOSIS — M25561 Pain in right knee: Secondary | ICD-10-CM | POA: Diagnosis not present

## 2022-03-28 DIAGNOSIS — R262 Difficulty in walking, not elsewhere classified: Secondary | ICD-10-CM

## 2022-03-28 DIAGNOSIS — R600 Localized edema: Secondary | ICD-10-CM

## 2022-03-29 ENCOUNTER — Ambulatory Visit
Admission: RE | Admit: 2022-03-29 | Discharge: 2022-03-29 | Disposition: A | Discharge status: Home / Self Care | Payer: Commercial Managed Care - HMO | Source: Ambulatory Visit | Attending: Family Medicine | Admitting: Family Medicine

## 2022-03-29 DIAGNOSIS — Z1231 Encounter for screening mammogram for malignant neoplasm of breast: Secondary | ICD-10-CM

## 2022-03-29 DIAGNOSIS — Z1239 Encounter for other screening for malignant neoplasm of breast: Secondary | ICD-10-CM

## 2022-03-30 ENCOUNTER — Encounter: Payer: Self-pay | Admitting: Family Medicine

## 2022-03-30 ENCOUNTER — Ambulatory Visit: Payer: Commercial Managed Care - HMO | Admitting: Physical Therapy

## 2022-04-02 ENCOUNTER — Encounter: Payer: Self-pay | Admitting: Physical Therapy

## 2022-04-02 ENCOUNTER — Ambulatory Visit: Payer: Commercial Managed Care - HMO | Attending: Family Medicine | Admitting: Physical Therapy

## 2022-04-02 DIAGNOSIS — M25561 Pain in right knee: Secondary | ICD-10-CM | POA: Insufficient documentation

## 2022-04-02 DIAGNOSIS — R600 Localized edema: Secondary | ICD-10-CM | POA: Insufficient documentation

## 2022-04-02 DIAGNOSIS — R262 Difficulty in walking, not elsewhere classified: Secondary | ICD-10-CM | POA: Diagnosis present

## 2022-04-02 NOTE — Therapy (Signed)
OUTPATIENT PHYSICAL THERAPY TREATMENT NOTE   Patient Name: Veronica [name] MRN: [id number] DOB:06-Jul-1960, 61 y.o., female Today's Date: 04/02/2022  PCP: Westley Chandler REFERRING PROVIDER: Dr. Norton Blizzard   END OF SESSION:   PT End of Session - 04/02/22 0847     Visit Number 4    Number of Visits 12    Date for PT Re-Evaluation 05/03/22    Authorization Type [employer]    PT Start Time 0848    PT Stop Time 0928    PT Time Calculation (min) 40 min              Past Medical History:  Diagnosis Date   Daily headache    Fibromyalgia    G6PD deficiency    outside labs--low level, no longer on HCQ   Glucose 6 phosphatase deficiency (HCC)    Hypertension    Prediabetes    Rheumatoid arthritis (HCC)    SLE (systemic lupus erythematosus) (HCC) 1998   Past Surgical History:  Procedure Laterality Date   ABDOMINAL HYSTERECTOMY  09/2008   CESAREAN SECTION  1992, 1995   CHOLECYSTECTOMY N/A 07/18/2012   Procedure: LAPAROSCOPIC CHOLECYSTECTOMY WITH INTRAOPERATIVE CHOLANGIOGRAM;  Surgeon: Atilano Ina, MD;  Location: [facility] OR;  Service: General;  Laterality: N/A;   ERCP N/A 07/29/2012   Procedure: ENDOSCOPIC RETROGRADE CHOLANGIOPANCREATOGRAPHY (ERCP);  Surgeon: Theda Belfast, MD;  Location: [facility];  Service: Endoscopy;  Laterality: N/A;  Dr. Elnoria Howard said he would start this PT arond 1330( AW)   INCISION AND DRAINAGE Right 11/28/2015   "boil on upper arm; did this in dr's office"   INCISION AND DRAINAGE ABSCESS Right 12/02/2015   Procedure: INCISION AND DRAINAGE ABSCESS;  Surgeon: Jimmye Norman, MD;  Location: [facility] OR;  Service: General;  Laterality: Right;  Right axillary abscess   TUBAL LIGATION  1995   Patient Active Problem List   Diagnosis Date Noted   Esophageal dysphagia 04/15/2020   Mood disorder (HCC) 11/13/2019   Atherosclerosis of aorta (HCC) 07/15/2019   Prediabetes 07/13/2019   Chronic abdominal pain 07/13/2019   Insomnia 12/15/2012   Essential hypertension, benign  04/10/2012   SLE (systemic lupus erythematosus) (HCC) 04/10/2012    REFERRING DIAG: Knee pain , Rt   THERAPY DIAG:  Acute pain of right knee  Difficulty in walking, not elsewhere classified  Localized edema  Rationale for Evaluation and Treatment Rehabilitation  PERTINENT HISTORY: see above   PRECAUTIONS: none   SUBJECTIVE:  SUBJECTIVE STATEMENT:  I have still been walking.  The knee has just a tad of pain. I am still walking with a limp and 4/10 pain. I want to get rid of the limp.   PAIN:  Are you having pain? Yes: NPRS scale: 4/10 Pain location: Inner Rt knee  Pain description: sore  Aggravating factors: standing, walking  Relieving factors: rest   OBJECTIVE:   PATIENT SURVEYS:  FOTO 42%, goal is 56%   COGNITION: Overall cognitive status: Within functional limits for tasks assessed                         SENSATION: WFL   EDEMA:  Circumferential: Rt 17 inch, Lt. 16 inch   MUSCLE LENGTH: Hamstrings: tight Thomas test: NT    POSTURE: No Significant postural limitations, mild genu valgus   PALPATION: Min TTP Rt medial joint line and posteriorly.  Discomfort with patellar mobs.    LOWER EXTREMITY ROM:   Active ROM Right eval Left eval  Hip flexion      Hip extension      Hip abduction      Hip adduction      Hip internal rotation      Hip external rotation      Knee flexion 126 132  Knee extension 4 0  Ankle dorsiflexion      Ankle plantarflexion      Ankle inversion      Ankle eversion       (Blank rows = not tested)   LOWER EXTREMITY MMT:   MMT Right eval Left eval  Hip flexion 4 4  Hip extension 4 4  Hip abduction 4 4  Hip adduction      Hip internal rotation      Hip external rotation      Knee flexion 5 5  Knee extension 5 5  Ankle dorsiflexion       Ankle plantarflexion      Ankle inversion      Ankle eversion       (Blank rows = not tested)   LOWER EXTREMITY SPECIAL TESTS:  NT   FUNCTIONAL TESTS:  5 times sit to stand: 19 sec  2 minute walk test: 440 feet, pain minimal at 2nd lap   GAIT: Distance walked: 150 Assistive device utilized: None Level of assistance: Complete Independence Comments: very min limp, slower pace     TODAY'S TREATMENT:                                                                                                                              [facility] Adult PT Treatment:                                                DATE: 04/02/22 Therapeutic  Exercise: Nustep L5 UE/LE x 5 mintes  STS from mat table x 10- cues for eccentric control  LAQ Red band 10 x 2  6 inch runners step up x 10 6 inch lateral step down x 10 Squat at counter x 15 SLS Rt 43 sec  Standing hip abduction 10 x 2 red band  Bilat heel raises Right gastroc stretch at counter 30 sec x 2 Supine Right hamstring stretch with strap 2 x 30 sec  SLR 10 x 2     [facility] Adult PT Treatment:                                                DATE: 03/27/22 Therapeutic Exercise: Standing hip abduction  2 x 15 each (added red loop 2nd set)  Squat wide x 10 at counter  Heel raise x 20  Step ups x 15 , 6 inch  Wall sit 5 x 10 sec isometric then full ROM x 10  LAQ 5 lbs x 15 each side  Bridge 2 x 10  SLR  x 15 each side  Sidelying x 10 hip ER Sidelying green band clam x 15  NuStep 6 min L7 Ue and LE  Self Care: Strengthening vs activity, POC     [facility] Adult PT Treatment:                                                DATE: 03/15/22 Therapeutic Exercise: Nustep LE and UE for 6 min L5 warm up Supine hamstring stretch with strap SLR x 15 toes up, then toes out  Knee extension with ball squeeze  Bridge x 10 Sidelying hip abduction 2 x 15 each LE  Sit to stand with 10 lbs x 10, cues  Self Care: FOTO , discussed goals Importance of HEP      PATIENT EDUCATION:  Education details: HEP, POC Person educated: Patient Education method: Programmer, multimedia, Demonstration, Verbal cues, and Handouts Education comprehension: verbalized understanding and needs further education   HOME EXERCISE PROGRAM: Access Code: [email or web address] URL: [email or web address] Date: 03/07/2022 Prepared by: Karie Mainland   Exercises - Supine Active Straight Leg Raise  - 1 x daily - 7 x weekly - 2 sets - 10 reps - 5 hold - Straight Leg Raise with External Rotation  - 1 x daily - 7 x weekly - 2 sets - 10 reps - 5 hold - Sidelying Hip Abduction  - 1 x daily - 7 x weekly - 2 sets - 10 reps - 5 hold - Seated Hamstring Stretch  - 1 x daily - 7 x weekly - 1 sets - 3 reps - 30 hold - Standing Hip Abduction with Counter Support  - 1 x daily - 7 x weekly - 2 sets - 10 reps - 5 hold - Mini Squat with Chair  - 1 x daily - 7 x weekly - 2 sets - 10 reps - 5 hold     ASSESSMENT:   CLINICAL IMPRESSION: Patient tolerated session well, with min increased pain with lateral step downs. She has returned to walking with her husband and she can verbalize 3 pain management strategies. She has met STG# 2,3. Her compliance with HEP was minimal last week due  to working 7 days. She plans to be more compliant this week. Continued with knee and hip strengthening per PT POC.     OBJECTIVE IMPAIRMENTS: decreased mobility, difficulty walking, decreased ROM, decreased strength, increased edema, increased fascial restrictions, obesity, and pain.    ACTIVITY LIMITATIONS: lifting, bending, sitting, standing, squatting, stairs, and locomotion level   PARTICIPATION LIMITATIONS: cleaning, interpersonal relationship, shopping, community activity, and occupation   PERSONAL FACTORS: 1 comorbidity: obesity,also work schedule  are also affecting patient's functional outcome.    REHAB POTENTIAL: Excellent   CLINICAL DECISION MAKING: Stable/uncomplicated   EVALUATION COMPLEXITY: Low      GOALS: Goals reviewed with patient? Yes   SHORT TERM GOALS: Target date: 04/04/2022  Pt will be Independent home exercise program Baseline: given on eval  Status: Pt is not compliant as of yet due to work  Goal status: ONGOING   2.  Pt will be able to resume walking routine with min increase in knee pain  Baseline:  Status: has returned to walking  Goal status: MET    3.  Pt will verbalize 3 RICE, self care strategies for pain control Baseline:  Status: 04/02/22: Rest, elevate, hot shower Goal status: MET     LONG TERM GOALS: Target date: 05/02/2022    Pt will complete HEP  Baseline:  Goal status: INITIAL   2.  Pt will demo 5/5 hip abduction and extension strength bilateral to optimize gait mechanics Baseline:  Goal status: INITIAL   3.  Pt will be able to sit for 30 minutes and no stiffness upon standing   Baseline: moderate  Goal status: INITIAL   4.  Patient will get up and down from the floor without difficulty Baseline:  Goal status: INITIAL   5.  Patient will return to doing stairs at work without increased pain or fear when not using rails Baseline:  Goal status: INITIAL   6.  Patient will perform 2-minute walk test without pain and ability to walk 500 feet.  Baseline:  Goal status: INITIAL     PLAN:   PT FREQUENCY: every other week 2 x each week when not working (4 visits per month)    PT DURATION: 8 weeks   PLANNED INTERVENTIONS: Therapeutic exercises, Therapeutic activity, Neuromuscular re-education, Balance training, Gait training, Patient/Family education, Self Care, Joint mobilization, Stair training, Cryotherapy, Moist heat, Taping, Manual therapy, and Re-evaluation   PLAN FOR NEXT SESSION:  goals. check HEP, bike vs TM, closed chain when able , step ups    Jannette Spanner, PTA 04/02/22 9:27 AM Phone: [phone] Fax: [phone]

## 2022-04-03 NOTE — Therapy (Deleted)
OUTPATIENT PHYSICAL THERAPY TREATMENT NOTE   Patient Name: Veronica [name] MRN: [id number] DOB:02-Sep-1960, 61 y.o., female Today's Date: 04/03/2022  PCP: Westley Chandler REFERRING PROVIDER: Dr. Norton Blizzard   END OF SESSION:      Past Medical History:  Diagnosis Date   Daily headache    Fibromyalgia    G6PD deficiency    outside labs--low level, no longer on HCQ   Glucose 6 phosphatase deficiency (HCC)    Hypertension    Prediabetes    Rheumatoid arthritis (HCC)    SLE (systemic lupus erythematosus) (HCC) 1998   Past Surgical History:  Procedure Laterality Date   ABDOMINAL HYSTERECTOMY  09/2008   CESAREAN SECTION  1992, 1995   CHOLECYSTECTOMY N/A 07/18/2012   Procedure: LAPAROSCOPIC CHOLECYSTECTOMY WITH INTRAOPERATIVE CHOLANGIOGRAM;  Surgeon: Atilano Ina, MD;  Location: [facility] OR;  Service: General;  Laterality: N/A;   ERCP N/A 07/29/2012   Procedure: ENDOSCOPIC RETROGRADE CHOLANGIOPANCREATOGRAPHY (ERCP);  Surgeon: Theda Belfast, MD;  Location: [facility];  Service: Endoscopy;  Laterality: N/A;  Dr. Elnoria Howard said he would start this PT arond 1330( AW)   INCISION AND DRAINAGE Right 11/28/2015   "boil on upper arm; did this in dr's office"   INCISION AND DRAINAGE ABSCESS Right 12/02/2015   Procedure: INCISION AND DRAINAGE ABSCESS;  Surgeon: Jimmye Norman, MD;  Location: [facility] OR;  Service: General;  Laterality: Right;  Right axillary abscess   TUBAL LIGATION  1995   Patient Active Problem List   Diagnosis Date Noted   Esophageal dysphagia 04/15/2020   Mood disorder (HCC) 11/13/2019   Atherosclerosis of aorta (HCC) 07/15/2019   Prediabetes 07/13/2019   Chronic abdominal pain 07/13/2019   Insomnia 12/15/2012   Essential hypertension, benign 04/10/2012   SLE (systemic lupus erythematosus) (HCC) 04/10/2012    REFERRING DIAG: Knee pain , Rt   THERAPY DIAG:  No diagnosis found.  Rationale for Evaluation and Treatment Rehabilitation  PERTINENT HISTORY: see above    PRECAUTIONS: none   SUBJECTIVE:                                                                                                                                                                                      SUBJECTIVE STATEMENT:  I have still been walking.  The knee has just a tad of pain. I am still walking with a limp and 4/10 pain. I want to get rid of the limp.   PAIN:  Are you having pain? Yes: NPRS scale: 4/10 Pain location: Inner Rt knee  Pain description: sore  Aggravating factors: standing, walking  Relieving factors: rest   OBJECTIVE:   PATIENT SURVEYS:  FOTO 42%, goal  is 56%   COGNITION: Overall cognitive status: Within functional limits for tasks assessed                         SENSATION: WFL   EDEMA:  Circumferential: Rt 17 inch, Lt. 16 inch   MUSCLE LENGTH: Hamstrings: tight Thomas test: NT    POSTURE: No Significant postural limitations, mild genu valgus   PALPATION: Min TTP Rt medial joint line and posteriorly.  Discomfort with patellar mobs.    LOWER EXTREMITY ROM:   Active ROM Right eval Left eval  Hip flexion      Hip extension      Hip abduction      Hip adduction      Hip internal rotation      Hip external rotation      Knee flexion 126 132  Knee extension 4 0  Ankle dorsiflexion      Ankle plantarflexion      Ankle inversion      Ankle eversion       (Blank rows = not tested)   LOWER EXTREMITY MMT:   MMT Right eval Left eval  Hip flexion 4 4  Hip extension 4 4  Hip abduction 4 4  Hip adduction      Hip internal rotation      Hip external rotation      Knee flexion 5 5  Knee extension 5 5  Ankle dorsiflexion      Ankle plantarflexion      Ankle inversion      Ankle eversion       (Blank rows = not tested)   LOWER EXTREMITY SPECIAL TESTS:  NT   FUNCTIONAL TESTS:  5 times sit to stand: 19 sec  2 minute walk test: 440 feet, pain minimal at 2nd lap   GAIT: Distance walked: 150 Assistive device utilized:  None Level of assistance: Complete Independence Comments: very min limp, slower pace     TODAY'S TREATMENT:  [facility] Adult PT Treatment:                                                DATE: 04/04/22 Therapeutic Exercise: *** Manual Therapy: *** Neuromuscular re-ed: *** Therapeutic Activity: *** Modalities: *** Self Care: ***                                                                                                                   [facility] Adult PT Treatment:                                                DATE: 04/02/22 Therapeutic Exercise: Nustep L5 UE/LE x 5 mintes  STS from mat table x 10- cues for  eccentric control  LAQ Red band 10 x 2  6 inch runners step up x 10 6 inch lateral step down x 10 Squat at counter x 15 SLS Rt 43 sec  Standing hip abduction 10 x 2 red band  Bilat heel raises Right gastroc stretch at counter 30 sec x 2 Supine Right hamstring stretch with strap 2 x 30 sec  SLR 10 x 2     [facility] Adult PT Treatment:                                                DATE: 03/27/22 Therapeutic Exercise: Standing hip abduction  2 x 15 each (added red loop 2nd set)  Squat wide x 10 at counter  Heel raise x 20  Step ups x 15 , 6 inch  Wall sit 5 x 10 sec isometric then full ROM x 10  LAQ 5 lbs x 15 each side  Bridge 2 x 10  SLR  x 15 each side  Sidelying x 10 hip ER Sidelying green band clam x 15  NuStep 6 min L7 Ue and LE  Self Care: Strengthening vs activity, POC     [facility] Adult PT Treatment:                                                DATE: 03/15/22 Therapeutic Exercise: Nustep LE and UE for 6 min L5 warm up Supine hamstring stretch with strap SLR x 15 toes up, then toes out  Knee extension with ball squeeze  Bridge x 10 Sidelying hip abduction 2 x 15 each LE  Sit to stand with 10 lbs x 10, cues  Self Care: FOTO , discussed goals Importance of HEP     PATIENT EDUCATION:  Education details: HEP, POC Person educated: Patient Education method:  Programmer, multimedia, Demonstration, Verbal cues, and Handouts Education comprehension: verbalized understanding and needs further education   HOME EXERCISE PROGRAM: Access Code: [email or web address] URL: [email or web address] Date: 03/07/2022 Prepared by: Karie Mainland   Exercises - Supine Active Straight Leg Raise  - 1 x daily - 7 x weekly - 2 sets - 10 reps - 5 hold - Straight Leg Raise with External Rotation  - 1 x daily - 7 x weekly - 2 sets - 10 reps - 5 hold - Sidelying Hip Abduction  - 1 x daily - 7 x weekly - 2 sets - 10 reps - 5 hold - Seated Hamstring Stretch  - 1 x daily - 7 x weekly - 1 sets - 3 reps - 30 hold - Standing Hip Abduction with Counter Support  - 1 x daily - 7 x weekly - 2 sets - 10 reps - 5 hold - Mini Squat with Chair  - 1 x daily - 7 x weekly - 2 sets - 10 reps - 5 hold     ASSESSMENT:   CLINICAL IMPRESSION: Patient tolerated session well, with min increased pain with lateral step downs. She has returned to walking with her husband and she can verbalize 3 pain management strategies. She has met STG# 2,3. Her compliance with HEP was minimal last week due to working 7 days. She plans to be more compliant this week. Continued with knee and  hip strengthening per PT POC.     OBJECTIVE IMPAIRMENTS: decreased mobility, difficulty walking, decreased ROM, decreased strength, increased edema, increased fascial restrictions, obesity, and pain.    ACTIVITY LIMITATIONS: lifting, bending, sitting, standing, squatting, stairs, and locomotion level   PARTICIPATION LIMITATIONS: cleaning, interpersonal relationship, shopping, community activity, and occupation   PERSONAL FACTORS: 1 comorbidity: obesity,also work schedule  are also affecting patient's functional outcome.    REHAB POTENTIAL: Excellent   CLINICAL DECISION MAKING: Stable/uncomplicated   EVALUATION COMPLEXITY: Low     GOALS: Goals reviewed with patient? Yes   SHORT TERM GOALS: Target date: 04/04/2022  Pt will  be Independent home exercise program Baseline: given on eval  Status: Pt is not compliant as of yet due to work  Goal status: ONGOING   2.  Pt will be able to resume walking routine with min increase in knee pain  Baseline:  Status: has returned to walking  Goal status: MET    3.  Pt will verbalize 3 RICE, self care strategies for pain control Baseline:  Status: 04/02/22: Rest, elevate, hot shower Goal status: MET     LONG TERM GOALS: Target date: 05/02/2022    Pt will complete HEP  Baseline:  Goal status: INITIAL   2.  Pt will demo 5/5 hip abduction and extension strength bilateral to optimize gait mechanics Baseline:  Goal status: INITIAL   3.  Pt will be able to sit for 30 minutes and no stiffness upon standing   Baseline: moderate  Goal status: INITIAL   4.  Patient will get up and down from the floor without difficulty Baseline:  Goal status: INITIAL   5.  Patient will return to doing stairs at work without increased pain or fear when not using rails Baseline:  Goal status: INITIAL   6.  Patient will perform 2-minute walk test without pain and ability to walk 500 feet.  Baseline:  Goal status: INITIAL     PLAN:   PT FREQUENCY: every other week 2 x each week when not working (4 visits per month)    PT DURATION: 8 weeks   PLANNED INTERVENTIONS: Therapeutic exercises, Therapeutic activity, Neuromuscular re-education, Balance training, Gait training, Patient/Family education, Self Care, Joint mobilization, Stair training, Cryotherapy, Moist heat, Taping, Manual therapy, and Re-evaluation   PLAN FOR NEXT SESSION:  goals. check HEP, bike vs TM, closed chain when able , step ups    Jannette Spanner, PTA 04/03/22 12:58 PM Phone: [phone] Fax: [phone]

## 2022-04-04 ENCOUNTER — Encounter: Payer: Commercial Managed Care - HMO | Admitting: Physical Therapy

## 2022-04-09 ENCOUNTER — Ambulatory Visit (INDEPENDENT_AMBULATORY_CARE_PROVIDER_SITE_OTHER): Payer: Commercial Managed Care - HMO | Admitting: Family Medicine

## 2022-04-09 ENCOUNTER — Encounter: Payer: Self-pay | Admitting: Family Medicine

## 2022-04-09 VITALS — BP 122/75 | HR 69 | Wt 236.8 lb

## 2022-04-09 DIAGNOSIS — I10 Essential (primary) hypertension: Secondary | ICD-10-CM

## 2022-04-09 DIAGNOSIS — Z789 Other specified health status: Secondary | ICD-10-CM | POA: Diagnosis not present

## 2022-04-09 DIAGNOSIS — I700 Atherosclerosis of aorta: Secondary | ICD-10-CM | POA: Diagnosis not present

## 2022-04-09 DIAGNOSIS — R7303 Prediabetes: Secondary | ICD-10-CM

## 2022-04-09 DIAGNOSIS — Z23 Encounter for immunization: Secondary | ICD-10-CM

## 2022-04-09 DIAGNOSIS — R1319 Other dysphagia: Secondary | ICD-10-CM

## 2022-04-09 MED ORDER — NAPROXEN 500 MG PO TABS: 500.0000 mg | ORAL_TABLET | Freq: Two times a day (BID) | ORAL | 0 refills | Status: DC

## 2022-04-09 NOTE — Progress Notes (Signed)
    SUBJECTIVE:   CHIEF COMPLAINT: check abscesses, headache  HPI:   Veronica [name] is a 61 y.o.  with history notable for TIA, HTN, tension type headache presenting for follow up  The patient reports overall she is doing okay.  She reports 1 day of a headache.  She intermittently has headaches but this 1 is posterior in her head as compared to her usual anterior headaches.  On Friday she got her hair braided.  On Sunday she had two eparate occasions of sharp pains in the posterior left aspect of her head.  Lasted 1 minute. She recently did have a viral upper respiratory tract infection.  She had no associated vision changes, speech changes, numbness or weakness.  She just has a dull ache in the back of her head currently.  She endorses some congestion and ear fullness.  The patient reports the spots under her arms have improved.  This resolved with the doxycycline.  The patient orts adherence to all her other medications.  She is not currently going to physical therapy.  PERTINENT  PMH / PSH/Family/Social History : Updated and reviewed as appropriate  OBJECTIVE:   BP 122/75   Pulse 69   Wt 236 lb 12.8 oz (107.4 kg)   SpO2 100%   BMI 40.65 kg/m   Today's weight:  Last Weight  Most recent update: 04/09/2022  9:56 AM    Weight  107.4 kg (236 lb 12.8 oz)            Review of prior weights: Filed Weights   04/09/22 0955  Weight: 236 lb 12.8 oz (107.4 kg)     Cardiac: Regular rate and rhythm. Normal S1/S2. No murmurs, rubs, or gallops appreciated. Lungs: Clear bilaterally to ascultation.   Neuro: CN II: PERRL visual fields tested and full  CN III, IV,VI: EOMI CV V: Normal sensation in V1, V2, V3 CVII: Symmetric smile and brow raise CN VIII: Normal hearing CN IX,X: Symmetric palate raise  CN XI: 5/5 shoulder shrug CN XII: Symmetric tongue protrusion  Normal sensation in UE and LE bilaterally  No ataxia with finger to nose, normal heel to shin  TTP along C-spine and  L trapezius L sided ME effusion noted   Psych: Pleasant and appropriate   Chaperoned exam Re-examined axilla for new lesions or adenopathy Axilla without adenopathy + prior scars from surgery but no abscesses or lesions    ASSESSMENT/PLAN:   Atherosclerosis of aorta (HCC) Lipid panel today. Compliant with statin.   Esophageal dysphagia GERD well controlled with dietary changes  Has stopped PPI    Headache Suspect tension type vs. Eustachian tube related Recommend restarting nasal steroid  Neurological exam reassuring Has been using Topamax PRN Discussed taking regularly, take 3 days of Naproxen, discontinue Mobic  AVS has return precautions  HCM COVID vaccine today  PPD placed as healthcare worker      [name], MD  Family Medicine Teaching Service  [facility]

## 2022-04-09 NOTE — Assessment & Plan Note (Signed)
Lipid panel today. Compliant with statin.

## 2022-04-09 NOTE — Progress Notes (Signed)
Covid-19 Vaccination Clinic  Name:  BRELYN BONITO    MRN: [id number] DOB: [birth year]  04/09/2022  Ms. Potrykus was observed post Covid-19 immunization for 15 minutes without incident. She was provided with Vaccine Information Sheet and instruction to access the V-Safe system.   Ms. Santodomingo was instructed to call 911 with any severe reactions post vaccine: Difficulty breathing  Swelling of face and throat  A fast heartbeat  A bad rash all over body  Dizziness and weakness

## 2022-04-09 NOTE — Patient Instructions (Signed)
It was wonderful to see you today.  Please bring ALL of your medications with you to every visit.   Today we talked about:  -Use your flonase for your L ear   - For your pain---you can take Naproxen twice a day--I sent a prescription to your pharmacy--do not take the same day when you take meloxicam  - We will check your cholesterol today   -We will place your PPD today  Call if your headache changes, worsens, or is not better by the end of the week   Go to the ED if you experience vision changes, speech changes, numbness or weakness    Please follow up in 3 months   Thank you for choosing [facility].   Please call [phone] with any questions about today's appointment.  Please be sure to schedule follow up at the front  desk before you leave today.   [name], MD  Family Medicine

## 2022-04-09 NOTE — Assessment & Plan Note (Signed)
GERD well controlled with dietary changes  Has stopped PPI

## 2022-04-10 LAB — LIPID PANEL
Chol/HDL Ratio: 3.1 ratio (ref 0.0–4.4)
Cholesterol, Total: 122 mg/dL (ref 100–199)
HDL: 40 mg/dL (ref >39)
LDL Chol Calc (NIH): 62 mg/dL (ref 0–99)
Triglycerides: 111 mg/dL (ref 0–149)
VLDL Cholesterol Cal: 20 mg/dL (ref 5–40)

## 2022-04-11 ENCOUNTER — Ambulatory Visit: Payer: Commercial Managed Care - HMO

## 2022-04-11 DIAGNOSIS — Z111 Encounter for screening for respiratory tuberculosis: Secondary | ICD-10-CM

## 2022-04-11 LAB — TB SKIN TEST
Induration: 0 mm
TB Skin Test: NEGATIVE

## 2022-04-11 NOTE — Progress Notes (Signed)
PPD Reading Note PPD read and results entered in EpicCare. Result: 0 mm induration. Interpretation: Negative Allergic reaction: No  

## 2022-04-12 ENCOUNTER — Encounter: Payer: Commercial Managed Care - HMO | Admitting: Physical Therapy

## 2022-04-12 ENCOUNTER — Ambulatory Visit: Payer: Commercial Managed Care - HMO | Admitting: Physical Therapy

## 2022-04-17 ENCOUNTER — Other Ambulatory Visit: Payer: Self-pay | Admitting: *Deleted

## 2022-04-17 MED ORDER — TOPIRAMATE 25 MG PO TABS: ORAL_TABLET | ORAL | 2 refills | Status: DC

## 2022-04-25 ENCOUNTER — Ambulatory Visit: Payer: Commercial Managed Care - HMO | Admitting: Physical Therapy

## 2022-04-25 ENCOUNTER — Encounter: Payer: Self-pay | Admitting: Physical Therapy

## 2022-04-25 DIAGNOSIS — M25561 Pain in right knee: Secondary | ICD-10-CM

## 2022-04-25 DIAGNOSIS — R600 Localized edema: Secondary | ICD-10-CM

## 2022-04-25 DIAGNOSIS — R262 Difficulty in walking, not elsewhere classified: Secondary | ICD-10-CM

## 2022-04-25 NOTE — Therapy (Signed)
OUTPATIENT PHYSICAL THERAPY TREATMENT NOTE   Patient Name: Veronica [name] MRN: [id number] DOB:02-27-1961, 61 y.o., female Today's Date: 04/25/2022  PCP: Westley Chandler REFERRING PROVIDER: Dr. Norton Blizzard   END OF SESSION:   PT End of Session - 04/25/22 1548     Visit Number 5    Number of Visits 12    Date for PT Re-Evaluation 05/03/22    Authorization Type [employer]    PT Start Time 1545    PT Stop Time 1630    PT Time Calculation (min) 45 min    Activity Tolerance Patient tolerated treatment well    Behavior During Therapy WFL for tasks assessed/performed               Past Medical History:  Diagnosis Date   Daily headache    Fibromyalgia    G6PD deficiency    outside labs--low level, no longer on HCQ   Glucose 6 phosphatase deficiency (HCC)    Hypertension    Prediabetes    Rheumatoid arthritis (HCC)    SLE (systemic lupus erythematosus) (HCC) 1998   Past Surgical History:  Procedure Laterality Date   ABDOMINAL HYSTERECTOMY  09/2008   CESAREAN SECTION  1992, 1995   CHOLECYSTECTOMY N/A 07/18/2012   Procedure: LAPAROSCOPIC CHOLECYSTECTOMY WITH INTRAOPERATIVE CHOLANGIOGRAM;  Surgeon: Atilano Ina, MD;  Location: [facility] OR;  Service: General;  Laterality: N/A;   ERCP N/A 07/29/2012   Procedure: ENDOSCOPIC RETROGRADE CHOLANGIOPANCREATOGRAPHY (ERCP);  Surgeon: Theda Belfast, MD;  Location: [facility];  Service: Endoscopy;  Laterality: N/A;  Dr. Elnoria Howard said he would start this PT arond 1330( AW)   INCISION AND DRAINAGE Right 11/28/2015   "boil on upper arm; did this in dr's office"   INCISION AND DRAINAGE ABSCESS Right 12/02/2015   Procedure: INCISION AND DRAINAGE ABSCESS;  Surgeon: Jimmye Norman, MD;  Location: [facility] OR;  Service: General;  Laterality: Right;  Right axillary abscess   TUBAL LIGATION  1995   Patient Active Problem List   Diagnosis Date Noted   Esophageal dysphagia 04/15/2020   Mood disorder (HCC) 11/13/2019   Atherosclerosis of aorta (HCC) 07/15/2019    Prediabetes 07/13/2019   Chronic abdominal pain 07/13/2019   Insomnia 12/15/2012   Essential hypertension, benign 04/10/2012   SLE (systemic lupus erythematosus) (HCC) 04/10/2012    REFERRING DIAG: Knee pain, Rt   THERAPY DIAG:  Acute pain of right knee  Difficulty in walking, not elsewhere classified  Localized edema  Rationale for Evaluation and Treatment Rehabilitation  PERTINENT HISTORY: see above   PRECAUTIONS: none   SUBJECTIVE:  SUBJECTIVE STATEMENT:  I have good days and bad.  Today it is 2/10.  She has been walking a little, doing stretches, got a bike (stationary).  I feel good when I'm here but has pain after.    PAIN:  Are you having pain? Yes: NPRS scale: 2/10 Pain location: anteromedial  R  Pain description: sore  Aggravating factors: standing, walking  Relieving factors: rest   OBJECTIVE:   PATIENT SURVEYS:  FOTO 42%, goal is 56%   COGNITION: Overall cognitive status: Within functional limits for tasks assessed                         SENSATION: WFL   EDEMA:  Circumferential: Rt 17 inch, Lt. 16 inch   MUSCLE LENGTH: Hamstrings: tight Thomas test: NT    POSTURE: No Significant postural limitations, mild genu valgus   PALPATION: Min TTP Rt medial joint line and posteriorly.  Discomfort with patellar mobs.    LOWER EXTREMITY ROM:   Active ROM Right eval Left eval  Hip flexion      Hip extension      Hip abduction      Hip adduction      Hip internal rotation      Hip external rotation      Knee flexion 126 132  Knee extension 4 0  Ankle dorsiflexion      Ankle plantarflexion      Ankle inversion      Ankle eversion       (Blank rows = not tested)   LOWER EXTREMITY MMT:   MMT Right eval Left eval Rt./Lt.  04/25/22  Hip flexion 4 4   Hip extension 4  4   Hip abduction 4 4 4-/5, NT   Hip adduction       Hip internal rotation       Hip external rotation       Knee flexion 5 5   Knee extension 5 5   Ankle dorsiflexion       Ankle plantarflexion       Ankle inversion       Ankle eversion        (Blank rows = not tested)   LOWER EXTREMITY SPECIAL TESTS:  NT   FUNCTIONAL TESTS:  5 times sit to stand: 19 sec  2 minute walk test: 440 feet, pain minimal at 2nd lap  04/25/22 445 feet , limp    GAIT: Distance walked: 150 Assistive device utilized: None Level of assistance: Complete Independence Comments: very min limp, slower pace     [facility] Adult PT Treatment:                                                DATE: 04/25/22 Therapeutic Exercise: Recumbent bike 5 min L3  SLR Rt LE 2 x 15 done bilaterally.  Hamstring Rt LE x 60 sec x 2 , then ITB x 1, 60 sec  Bridging  x 15  Sidelying hip abduction x 15  2 min walk test  Calf stretch on wall and then slantboard x 30 sec x 3 Lateral step up  Hip abduction x 15  Reverse step up  x 15 each side , 6 inch step  Squat wide knees x 15 at counter top  Tandem in corner on Airex each leg, added  head turns and eyes closed for balance challenge                                                                                                                 [facility] Adult PT Treatment:                                                DATE: 04/02/22 Therapeutic Exercise: Nustep L5 UE/LE x 5 mintes  STS from mat table x 10- cues for eccentric control  LAQ Red band 10 x 2  6 inch runners step up x 10 6 inch lateral step down x 10 Squat at counter x 15 SLS Rt 43 sec  Standing hip abduction 10 x 2 red band  Bilat heel raises Right gastroc stretch at counter 30 sec x 2 Supine Right hamstring stretch with strap 2 x 30 sec  SLR 10 x 2     [facility] Adult PT Treatment:                                                DATE: 03/27/22 Therapeutic Exercise: Standing hip abduction  2 x 15 each (added red loop  2nd set)  Squat wide x 10 at counter  Heel raise x 20  Step ups x 15 , 6 inch  Wall sit 5 x 10 sec isometric then full ROM x 10  LAQ 5 lbs x 15 each side  Bridge 2 x 10  SLR  x 15 each side  Sidelying x 10 hip ER Sidelying green band clam x 15  NuStep 6 min L7 Ue and LE  Self Care: Strengthening vs activity, POC     [facility] Adult PT Treatment:                                                DATE: 03/15/22 Therapeutic Exercise: Nustep LE and UE for 6 min L5 warm up Supine hamstring stretch with strap SLR x 15 toes up, then toes out  Knee extension with ball squeeze  Bridge x 10 Sidelying hip abduction 2 x 15 each LE  Sit to stand with 10 lbs x 10, cues  Self Care: FOTO , discussed goals Importance of HEP     PATIENT EDUCATION:  Education details: HEP, POC Person educated: Patient Education method: Programmer, multimedia, Demonstration, Verbal cues, and Handouts Education comprehension: verbalized understanding and needs further education   HOME EXERCISE PROGRAM: Access Code: [email or web address] URL: [email or web address] Date: 03/07/2022 Prepared by: Karie Mainland   Exercises - Supine Active Straight Leg Raise  - 1 x daily - 7 x weekly - 2 sets -  10 reps - 5 hold - Straight Leg Raise with External Rotation  - 1 x daily - 7 x weekly - 2 sets - 10 reps - 5 hold - Sidelying Hip Abduction  - 1 x daily - 7 x weekly - 2 sets - 10 reps - 5 hold - Seated Hamstring Stretch  - 1 x daily - 7 x weekly - 1 sets - 3 reps - 30 hold - Standing Hip Abduction with Counter Support  - 1 x daily - 7 x weekly - 2 sets - 10 reps - 5 hold - Mini Squat with Chair  - 1 x daily - 7 x weekly - 2 sets - 10 reps - 5 hold  -Calf stretch 30 sec x 3-5    ASSESSMENT:   CLINICAL IMPRESSION: Patient feeling knee pain, intermittently.  She continue to complain of stiffness when sitting and transitioning to stand, getting out of bed.  She is stiff in knee extension which is evident when walking.  She needs cues to  heel strike.  Overall she did well with standing closed chain exercises.  She has been a bit more consistent with HEP. Her insurance will be changing in 2024 and will schedule more visits when she is certain she has coverage. FOTO score is improved overall to 60%.    OBJECTIVE IMPAIRMENTS: decreased mobility, difficulty walking, decreased ROM, decreased strength, increased edema, increased fascial restrictions, obesity, and pain.    ACTIVITY LIMITATIONS: lifting, bending, sitting, standing, squatting, stairs, and locomotion level   PARTICIPATION LIMITATIONS: cleaning, interpersonal relationship, shopping, community activity, and occupation   PERSONAL FACTORS: 1 comorbidity: obesity,also work schedule  are also affecting patient's functional outcome.    REHAB POTENTIAL: Excellent   CLINICAL DECISION MAKING: Stable/uncomplicated   EVALUATION COMPLEXITY: Low     GOALS: Goals reviewed with patient? Yes   SHORT TERM GOALS: Target date: 04/04/2022  Pt will be Independent home exercise program Baseline: given on eval  Status: Pt is not compliant as of yet due to work  Goal status: ONGOING   2.  Pt will be able to resume walking routine with min increase in knee pain  Baseline:  Status: has returned to walking  Goal status: MET    3.  Pt will verbalize 3 RICE, self care strategies for pain control Baseline:  Status: 04/02/22: Rest, elevate, hot shower Goal status: MET     LONG TERM GOALS: Target date: 05/02/2022    Pt will complete HEP  Baseline:  Goal status: INITIAL   2.  Pt will demo 5/5 hip abduction and extension strength bilateral to optimize gait mechanics Baseline:  Goal status: INITIAL   3.  Pt will be able to sit for 30 minutes and no stiffness upon standing   Baseline: moderate  Goal status:ongoing    4.  Patient will get up and down from the floor without difficulty Baseline:  Goal status: ongoing    5.  Patient will return to doing stairs at work without  increased pain or fear when not using rails Baseline:  Goal status: ongoing    6.  Patient will perform 2-minute walk test without pain and ability to walk 500 feet.  Baseline:  Goal status: INITIAL     PLAN:   PT FREQUENCY: every other week 2 x each week when not working (4 visits per month)    PT DURATION: 8 weeks   PLANNED INTERVENTIONS: Therapeutic exercises, Therapeutic activity, Neuromuscular re-education, Balance training, Gait training, Patient/Family education, Self  Care, Joint mobilization, Stair training, Cryotherapy, Moist heat, Taping, Manual therapy, and Re-evaluation   PLAN FOR NEXT SESSION:  renew for more visits, maybe 3-4 more weeks .  Hip strength, reducing limp is her pain goal.  bike vs TM, closed chain when able , step ups    Karie Mainland, PT 04/25/22 3:59 PM Phone: [phone] Fax: [phone]

## 2022-06-04 ENCOUNTER — Ambulatory Visit (INDEPENDENT_AMBULATORY_CARE_PROVIDER_SITE_OTHER): Payer: 59 | Admitting: Family Medicine

## 2022-06-04 VITALS — BP 133/77 | Ht 64.0 in | Wt 230.0 lb

## 2022-06-04 DIAGNOSIS — M25561 Pain in right knee: Secondary | ICD-10-CM

## 2022-06-04 NOTE — Progress Notes (Unsigned)
  ALAINAH PHANG - 62 y.o. female MRN [id number]  Date of birth: [birth year]  SUBJECTIVE:   HPI: Patient seen sports medicine clinic on 01/28/2022 for right knee pain and diagnosed with likely meniscus tear with flap component, had positive Apley's and McMurray's test.  MRI showed root tear of posterior horn of medial meniscus, mild thinning of articular cartilage patellofemoral and medial compartments. She started PT, went for 5 sessions but stopped d/t change in insurance. Last went in December. She did not feel the PT was helping but is willing to try it again. Overall states she pain has improved a little over the past few months. She is still walking with a limp. Pain is worse with applying pressure especially going up and down steps. She is still taking meloxicam daily and has been taking tylenol for the past 2 days. She is doing home exercises and stretching at most a couple times a week.    PHYSICAL EXAM:  VS: BP:133/77  HR: bpm  TEMP: ( )  RESP:   HT:5\' 4"  (162.6 cm)   WT:230 lb (104.3 kg)  BMI:39.46 PHYSICAL EXAM: R Knee: - Inspection: no gross deformity.  - Palpation: medial joint line tenderness - ROM: full active ROM with extension in knee, limited flexion (~115 degrees) - Strength: 5/5 strength - Neuro/vasc: NV intact - Special Tests: - LIGAMENTS: negative anterior and posterior drawer, negative Lachman's, no MCL or LCL laxity  -- MENISCUS: Positive McMurray's, negative Apley's   ASSESSMENT & PLAN:   R Knee Pain D/t meniscus tear as seen on MRI and consistent with physical exam. Pt would prefer to try PT again before surgery.  -Referral to PT -Continue meloxicam -Return for f/u in 6 weeks   Precious Gilding, DO Family Medicine Resident, PGY-2

## 2022-06-06 ENCOUNTER — Other Ambulatory Visit: Payer: Self-pay | Admitting: *Deleted

## 2022-06-06 DIAGNOSIS — K5904 Chronic idiopathic constipation: Secondary | ICD-10-CM

## 2022-06-06 MED ORDER — POLYETHYLENE GLYCOL 3350 17 GM/SCOOP PO POWD: 17.0000 g | Freq: Two times a day (BID) | ORAL | 1 refills | Status: DC | PRN

## 2022-06-18 ENCOUNTER — Other Ambulatory Visit: Payer: Self-pay | Admitting: *Deleted

## 2022-06-18 DIAGNOSIS — I10 Essential (primary) hypertension: Secondary | ICD-10-CM

## 2022-06-18 MED ORDER — SPIRONOLACTONE 25 MG PO TABS: 25.0000 mg | ORAL_TABLET | Freq: Every day | ORAL | 1 refills | Status: DC

## 2022-06-20 ENCOUNTER — Encounter: Payer: Self-pay | Admitting: Physical Therapy

## 2022-06-20 ENCOUNTER — Ambulatory Visit: Payer: 59 | Attending: Family Medicine | Admitting: Physical Therapy

## 2022-06-20 DIAGNOSIS — M25561 Pain in right knee: Secondary | ICD-10-CM | POA: Insufficient documentation

## 2022-06-20 DIAGNOSIS — R600 Localized edema: Secondary | ICD-10-CM | POA: Insufficient documentation

## 2022-06-20 DIAGNOSIS — R262 Difficulty in walking, not elsewhere classified: Secondary | ICD-10-CM | POA: Insufficient documentation

## 2022-06-20 NOTE — Therapy (Signed)
OUTPATIENT PHYSICAL THERAPY TREATMENT NOTE   Patient Name: Veronica [name] MRN: [id number] DOB:Mar 31, 1961, 62 y.o., female Today's Date: 06/20/2022  PCP: Martyn Malay REFERRING PROVIDER: Dr. Karlton Lemon   END OF SESSION:   PT End of Session - 06/20/22 1107     Visit Number 6    Number of Visits 14    Date for PT Re-Evaluation 08/15/22    Authorization Type [employer]    PT Start Time 1105    Activity Tolerance Patient tolerated treatment well    Behavior During Therapy [facility] for tasks assessed/performed               Past Medical History:  Diagnosis Date   Daily headache    Fibromyalgia    G6PD deficiency    outside labs--low level, no longer on HCQ   Glucose 6 phosphatase deficiency ([place])    Hypertension    Prediabetes    Rheumatoid arthritis ([place])    SLE (systemic lupus erythematosus) ([place]) 1998   Past Surgical History:  Procedure Laterality Date   ABDOMINAL HYSTERECTOMY  09/2008   [place] N/A 07/18/2012   Procedure: LAPAROSCOPIC CHOLECYSTECTOMY WITH INTRAOPERATIVE CHOLANGIOGRAM;  Surgeon: Gayland Curry, MD;  Location: [place];  Service: General;  Laterality: N/A;   ERCP N/A 07/29/2012   Procedure: ENDOSCOPIC RETROGRADE CHOLANGIOPANCREATOGRAPHY (ERCP);  Surgeon: Beryle Beams, MD;  Location: [facility];  Service: Endoscopy;  Laterality: N/A;  Dr. Benson Norway said he would start this PT arond 1330( AW)   INCISION AND DRAINAGE Right 11/28/2015   "boil on upper arm; did this in dr's office"   INCISION AND DRAINAGE ABSCESS Right 12/02/2015   Procedure: INCISION AND DRAINAGE ABSCESS;  Surgeon: Judeth Horn, MD;  Location: [place];  Service: General;  Laterality: Right;  Right axillary abscess   TUBAL LIGATION  1995   Patient Active Problem List   Diagnosis Date Noted   Esophageal dysphagia 04/15/2020   Mood disorder ([place]) 11/13/2019   Atherosclerosis of aorta ([place]) 07/15/2019   Prediabetes 07/13/2019   Chronic abdominal  pain 07/13/2019   Insomnia 12/15/2012   Essential hypertension, benign 04/10/2012   SLE (systemic lupus erythematosus) ([place]) 04/10/2012    REFERRING DIAG: Knee pain, Rt   THERAPY DIAG:  Acute pain of right knee  Difficulty in walking, not elsewhere classified  Localized edema  Rationale for Evaluation and Treatment Rehabilitation  PERTINENT HISTORY: see above   PRECAUTIONS: none   SUBJECTIVE:  SUBJECTIVE STATEMENT:  I'm back.  The doctor wants me to try this again before we do surgery. I still can't walk on it a lot .  I have been doing the stretching.  She is still working. Hard to get up from a chair. Stairs too.   PAIN:  Are you having pain? Yes: NPRS scale: 4/10 Pain location: anteromedial  R  Pain description: sore  Aggravating factors: standing, walking  Relieving factors: rest   OBJECTIVE:   PATIENT SURVEYS:  FOTO 42%, goal is 56%   06/20/22: 41% goal is 56%   COGNITION: Overall cognitive status: Within functional limits for tasks assessed                         SENSATION: WFL   EDEMA:  Circumferential: Rt 17 inch, Lt. 16 inch  Rt 15 inch  , Lt. 15.75 inch  MUSCLE LENGTH: Hamstrings: tight Thomas test: NT    POSTURE: No Significant postural limitations, mild genu valgus   PALPATION: Min TTP Rt medial joint line and posteriorly.  Discomfort with patellar mobs.    LOWER EXTREMITY ROM:   Active ROM Right eval Left eval Rt./Lt.  06/20/22  Hip flexion       Hip extension       Hip abduction       Hip adduction       Hip internal rotation       Hip external rotation       Knee flexion 126 132 120  Knee extension 4 0 7  Ankle dorsiflexion       Ankle plantarflexion       Ankle inversion       Ankle eversion        (Blank rows = not tested)   LOWER EXTREMITY MMT:    MMT Right eval Left eval Rt./Lt.  04/25/22 Rt 06/20/22  Hip flexion 4 4  5/5  Hip extension 4 4  4/5  Hip abduction 4 4 4-/5, NT  3+/5  Hip adduction        Hip internal rotation        Hip external rotation        Knee flexion 5 5  5/5  Knee extension 5 5  4/5 some discomfort   Ankle dorsiflexion        Ankle plantarflexion        Ankle inversion        Ankle eversion         (Blank rows = not tested)   LOWER EXTREMITY SPECIAL TESTS:  NT   FUNCTIONAL TESTS:  5 times sit to stand: 19 sec   06/20/22: 15 sec  2 minute walk test: 440 feet, pain minimal at 2nd lap  04/25/22 445 feet , limp    GAIT: Distance walked: 150 Assistive device utilized: None Level of assistance: Complete Independence Comments: very min limp, slower pace   [facility] Adult PT Treatment:                                                DATE: 06/20/22 Therapeutic Exercise: NuStep L5 UE and LE for 6 min  SLR toes up and toes out x 12 each  Banded knee extension red band x 15  Hamstring stretch 30 sec x 2  Hip abduction x 15  Prone knee flexion  Prone hip extension 2 x 10  Sit to stand x 5 = 15 sec  Self Care: Importance of strength training    [place] Adult PT Treatment:                                                DATE: 04/25/22 Therapeutic Exercise: Recumbent bike 5 min L3  SLR Rt LE 2 x 15 done bilaterally.  Hamstring Rt LE x 60 sec x 2 , then ITB x 1, 60 sec  Bridging  x 15  Sidelying hip abduction x 15  2 min walk test  Calf stretch on wall and then slantboard x 30 sec x 3 Lateral step up  Hip abduction x 15  Reverse step up  x 15 each side , 6 inch step  Squat wide knees x 15 at counter top  Tandem in corner on Airex each leg, added head turns and eyes closed for balance challenge                                                                                                                    PATIENT EDUCATION:  Education details: refreshed HEP, return, POC  Person educated:  Patient Education method: Consulting civil engineer, Demonstration, Verbal cues, and Handouts Education comprehension: verbalized understanding and needs further education   HOME EXERCISE PROGRAM: Access Code: [email or web address] URL: [email or web address] Date: 03/07/2022 Prepared by: Raeford Razor   Exercises - Supine Active Straight Leg Raise  - 1 x daily - 7 x weekly - 2 sets - 10 reps - 5 hold - Straight Leg Raise with External Rotation  - 1 x daily - 7 x weekly - 2 sets - 10 reps - 5 hold - Sidelying Hip Abduction  - 1 x daily - 7 x weekly - 2 sets - 10 reps - 5 hold - Seated Hamstring Stretch  - 1 x daily - 7 x weekly - 1 sets - 3 reps - 30 hold - Standing Hip Abduction with Counter Support  - 1 x daily - 7 x weekly - 2 sets - 10 reps - 5 hold - Mini Squat with Chair  - 1 x daily - 7 x weekly - 2 sets - 10 reps - 5 hold  -Calf stretch 30 sec x 3-5    ASSESSMENT:   CLINICAL IMPRESSION: Patient returns after about 2 mos at the request of her MD.  She had stopped coming due to insurance and limited benefit of PT.  She has returned with continued difficulty with walking, stairs and stiffness of her knee.  She has been doing her recumbent bike and stretching but not necessarily the strengthening exercises.  Will resume strength and balance exercises to see if she can avoid surgery for about 6-8 visits.  She can only come 1 x per week due to work.  OBJECTIVE IMPAIRMENTS: decreased mobility, difficulty walking, decreased ROM, decreased strength, increased edema, increased fascial restrictions, obesity, and pain.    ACTIVITY LIMITATIONS: lifting, bending, sitting, standing, squatting, stairs, and locomotion level   PARTICIPATION LIMITATIONS: cleaning, interpersonal relationship, shopping, community activity, and occupation   PERSONAL FACTORS: 1 comorbidity: obesity,also work schedule  are also affecting patient's functional outcome.    REHAB POTENTIAL: Excellent   CLINICAL DECISION MAKING:  Stable/uncomplicated   EVALUATION COMPLEXITY: Low     GOALS: Goals reviewed with patient? Yes   SHORT TERM GOALS: Target date: 04/04/2022  Pt will be Independent home exercise program Baseline: given on eval  Status: Pt is not compliant as of yet due to work  Goal status: ONGOING   2.  Pt will be able to resume walking routine with min increase in knee pain  Baseline:  Status: has returned to walking, 2/21 has stopped   Goal status:ONGOING    3.  Pt will verbalize 3 RICE, self care strategies for pain control Baseline:  Status: 04/02/22: Rest, elevate, hot shower Goal status: MET     LONG TERM GOALS: Target date: 05/02/2022    Pt will complete HEP  Baseline:  Goal status: INITIAL   2.  Pt will demo 5/5 hip abduction and extension strength bilateral to optimize gait mechanics Baseline:  Goal status: INITIAL   3.  Pt will be able to sit for 30 minutes and no stiffness upon standing   Baseline: moderate  Goal status:ongoing    4.  Patient will get up and down from the floor without difficulty Baseline:  Goal status: ongoing    5.  Patient will return to doing stairs at work without increased pain or fear when not using rails Baseline:  Goal status: ongoing    6.  Patient will perform 2-minute walk test without pain and ability to walk 500 feet.  Baseline:  Goal status: INITIAL     PLAN:   PT FREQUENCY: every other week 2 x each week when not working (4 visits per month)    PT DURATION: 8 weeks   PLANNED INTERVENTIONS: Therapeutic exercises, Therapeutic activity, Neuromuscular re-education, Balance training, Gait training, Patient/Family education, Self Care, Joint mobilization, Stair training, Cryotherapy, Moist heat, Taping, Manual therapy, and Re-evaluation   PLAN FOR NEXT SESSION:     Hip strength, reducing limp is her pain goal.  bike vs TM, closed chain when able , step ups   Raeford Razor, PT 06/20/22 11:50 AM Phone: [phone] Fax: [phone]

## 2022-06-25 ENCOUNTER — Other Ambulatory Visit: Payer: Self-pay | Admitting: Family Medicine

## 2022-06-25 DIAGNOSIS — I10 Essential (primary) hypertension: Secondary | ICD-10-CM

## 2022-07-05 ENCOUNTER — Encounter: Payer: Self-pay | Admitting: Physical Therapy

## 2022-07-05 ENCOUNTER — Ambulatory Visit: Payer: 59 | Attending: Family Medicine | Admitting: Physical Therapy

## 2022-07-05 DIAGNOSIS — R600 Localized edema: Secondary | ICD-10-CM | POA: Diagnosis present

## 2022-07-05 DIAGNOSIS — M25561 Pain in right knee: Secondary | ICD-10-CM | POA: Diagnosis present

## 2022-07-05 DIAGNOSIS — R262 Difficulty in walking, not elsewhere classified: Secondary | ICD-10-CM | POA: Insufficient documentation

## 2022-07-05 NOTE — Therapy (Signed)
OUTPATIENT PHYSICAL THERAPY TREATMENT NOTE   Patient Name: Veronica [name] MRN: [id number] DOB:Jul 27, 1960, 62 y.o., female Today's Date: 07/05/2022  PCP: Martyn Malay REFERRING PROVIDER: Dr. 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[place] )    Hypertension    Prediabetes    Rheumatoid arthritis ([place])    SLE (systemic lupus erythematosus) ([place]) 1998   Past Surgical History:  Procedure Laterality Date   ABDOMINAL HYSTERECTOMY  09/2008   [place] N/A 07/18/2012   Procedure: LAPAROSCOPIC CHOLECYSTECTOMY WITH INTRAOPERATIVE CHOLANGIOGRAM;  Surgeon: Gayland Curry, MD;  Location: [facility];  Service: General;  Laterality: N/A;   ERCP N/A 07/29/2012   Procedure: ENDOSCOPIC RETROGRADE CHOLANGIOPANCREATOGRAPHY (ERCP);  Surgeon: Beryle Beams, MD;  Location: [facility];  Service: Endoscopy;  Laterality: N/A;  Dr. Benson Norway said he would start this PT arond 1330( AW)   INCISION AND DRAINAGE Right 11/28/2015   "boil on upper arm; did this in dr's office"   INCISION AND DRAINAGE ABSCESS Right 12/02/2015   Procedure: INCISION AND DRAINAGE ABSCESS;  Surgeon: Judeth Horn, MD;  Location: [facility];  Service: General;  Laterality: Right;  Right axillary abscess   TUBAL LIGATION  1995   Patient Active Problem List   Diagnosis Date Noted   Esophageal dysphagia 04/15/2020   Mood disorder ([place]) 11/13/2019   Atherosclerosis of aorta ([place]) 07/15/2019   Prediabetes 07/13/2019   Chronic abdominal pain 07/13/2019   Insomnia 12/15/2012   Essential  hypertension, benign 04/10/2012   SLE (systemic lupus erythematosus) ([place]) 04/10/2012    REFERRING DIAG: Knee pain, Rt   THERAPY DIAG:  Acute pain of right knee  Difficulty in walking, not elsewhere classified  Localized edema  Rationale for Evaluation and Treatment Rehabilitation  PERTINENT HISTORY: see above   PRECAUTIONS: none   SUBJECTIVE:  SUBJECTIVE STATEMENT:  I've been doing the exercises. It seems better. I avoid the stairs.  PAIN:  Are you having pain? Yes: NPRS scale: 2/10 Pain location: anteromedial  R  Pain description: sore  Aggravating factors: standing, walking  Relieving factors: rest   OBJECTIVE:   PATIENT SURVEYS:  FOTO 42%, goal is 56%   06/20/22: 41% goal is 56%   COGNITION: Overall cognitive status: Within functional limits for tasks assessed                         SENSATION: WFL   EDEMA:  Circumferential: Rt 17 inch, Lt. 16 inch  Rt 15 inch  , Lt. 15.75 inch  MUSCLE LENGTH: Hamstrings: tight Thomas test: NT    POSTURE: No Significant postural limitations, mild genu valgus   PALPATION: Min TTP Rt medial joint line and posteriorly.  Discomfort with patellar mobs.    LOWER EXTREMITY ROM:   Active ROM Right eval Left eval Rt./Lt.  06/20/22  Hip flexion       Hip extension       Hip abduction       Hip adduction       Hip internal rotation       Hip external rotation       Knee flexion 126 132 120  Knee extension 4 0 7  Ankle dorsiflexion       Ankle plantarflexion       Ankle inversion       Ankle eversion        (Blank rows = not tested)   LOWER EXTREMITY MMT:   MMT Right eval Left eval Rt./Lt.  04/25/22 Rt 06/20/22  Hip flexion 4 4  5/5  Hip extension 4 4  4/5  Hip abduction 4 4 4-/5, NT  3+/5  Hip adduction        Hip internal rotation         Hip external rotation        Knee flexion 5 5  5/5  Knee extension 5 5  4/5 some discomfort   Ankle dorsiflexion        Ankle plantarflexion        Ankle inversion        Ankle eversion         (Blank rows = not tested)   LOWER EXTREMITY SPECIAL TESTS:  NT   FUNCTIONAL TESTS:  5 times sit to stand: 19 sec   06/20/22: 15 sec  2 minute walk test: 440 feet, pain minimal at 2nd lap  04/25/22 445 feet , limp    GAIT: Distance walked: 150 Assistive device utilized: None Level of assistance: Complete Independence Comments: very min limp, slower pace   [facility] Adult PT Treatment:                                                DATE: 07/05/22 Therapeutic Exercise: SLR toe up x 10, Toe out x 10 SAQ 3#  Bridge x 10 Hip abduction x 15  Hip flexor stretch Hamstring stretch  Seated LAQ 3# 10 x 2  Standing heel raises 10 x 2  Calf stretch  Tandem stance Hamstring curls right 15 each  STS x 10  Nustep L5 x 5 minutes UE/LE   [facility] Adult PT Treatment:  DATE: 06/20/22 Therapeutic Exercise: NuStep L5 UE and LE for 6 min  SLR toes up and toes out x 12 each  Banded knee extension red band x 15  Hamstring stretch 30 sec x 2  Hip abduction x 15  Prone knee flexion  Prone hip extension 2 x 10  Sit to stand x 5 = 15 sec  Self Care: Importance of strength training    [facility] Adult PT Treatment:                                                DATE: 04/25/22 Therapeutic Exercise: Recumbent bike 5 min L3  SLR Rt LE 2 x 15 done bilaterally.  Hamstring Rt LE x 60 sec x 2 , then ITB x 1, 60 sec  Bridging  x 15  Sidelying hip abduction x 15  2 min walk test  Calf stretch on wall and then slantboard x 30 sec x 3 Lateral step up  Hip abduction x 15  Reverse step up  x 15 each side , 6 inch step  Squat wide knees x 15 at counter top  Tandem in corner on Airex each leg, added head turns and eyes closed for balance challenge                                                                                                                     PATIENT EDUCATION:  Education details: refreshed HEP, return, POC  Person educated: Patient Education method: Consulting civil engineer, Demonstration, Verbal cues, and Handouts Education comprehension: verbalized understanding and needs further education   HOME EXERCISE PROGRAM: Access Code: [email or web address] URL: [email or web address] Date: 03/07/2022 Prepared by: Raeford Razor   Exercises - Supine Active Straight Leg Raise  - 1 x daily - 7 x weekly - 2 sets - 10 reps - 5 hold - Straight Leg Raise with External Rotation  - 1 x daily - 7 x weekly - 2 sets - 10 reps - 5 hold - Sidelying Hip Abduction  - 1 x daily - 7 x weekly - 2 sets - 10 reps - 5 hold - Seated Hamstring Stretch  - 1 x daily - 7 x weekly - 1 sets - 3 reps - 30 hold - Standing Hip Abduction with Counter Support  - 1 x daily - 7 x weekly - 2 sets - 10 reps - 5 hold - Mini Squat with Chair  - 1 x daily - 7 x weekly - 2 sets - 10 reps - 5 hold  -Calf stretch 30 sec x 3-5    ASSESSMENT:   CLINICAL IMPRESSION: Patient reports resuming HEP has been helpful and she notices improvement. Continued with open chain strength, balance and began STS. She did not have increased pain. Discussed using toilet for STS practice as she does not have standard chairs at home.     OBJECTIVE IMPAIRMENTS:  decreased mobility, difficulty walking, decreased ROM, decreased strength, increased edema, increased fascial restrictions, obesity, and pain.    ACTIVITY LIMITATIONS: lifting, bending, sitting, standing, squatting, stairs, and locomotion level   PARTICIPATION LIMITATIONS: cleaning, interpersonal relationship, shopping, community activity, and occupation   PERSONAL FACTORS: 1 comorbidity: obesity,also work schedule  are also affecting patient's functional outcome.    REHAB POTENTIAL: Excellent   CLINICAL DECISION MAKING: Stable/uncomplicated    EVALUATION COMPLEXITY: Low     GOALS: Goals reviewed with patient? Yes   SHORT TERM GOALS: Target date: 04/04/2022  Pt will be Independent home exercise program Baseline: given on eval  Status: Pt is not compliant as of yet due to work  Goal status: ONGOING   2.  Pt will be able to resume walking routine with min increase in knee pain  Baseline:  Status: has returned to walking,  2/21 has stopped   Goal status:ONGOING    3.  Pt will verbalize 3 RICE, self care strategies for pain control Baseline:  Status: 04/02/22: Rest, elevate, hot shower Goal status: MET     LONG TERM GOALS: Target date: 08/15/2022    Pt will complete HEP  Baseline:  Goal status: INITIAL   2.  Pt will demo 5/5 hip abduction and extension strength bilateral to optimize gait mechanics Baseline:  Goal status: INITIAL   3.  Pt will be able to sit for 30 minutes and no stiffness upon standing   Baseline: moderate  Goal status:ongoing    4.  Patient will get up and down from the floor without difficulty Baseline:  Goal status: ongoing    5.  Patient will return to doing stairs at work without increased pain or fear when not using rails Baseline:  Goal status: ongoing    6.  Patient will perform 2-minute walk test without pain and ability to walk 500 feet.  Baseline:  Goal status: INITIAL     PLAN:   PT FREQUENCY: every other week 2 x each week when not working (4 visits per month)    PT DURATION: 8 weeks   PLANNED INTERVENTIONS: Therapeutic exercises, Therapeutic activity, Neuromuscular re-education, Balance training, Gait training, Patient/Family education, Self Care, Joint mobilization, Stair training, Cryotherapy, Moist heat, Taping, Manual therapy, and Re-evaluation   PLAN FOR NEXT SESSION:     Hip strength, reducing limp is her pain goal.  bike vs TM, closed chain when able , step ups   Hessie Diener, PTA 07/05/22 3:29 PM Phone: [phone] Fax: [phone]

## 2022-07-10 ENCOUNTER — Ambulatory Visit: Payer: 59 | Admitting: Physical Therapy

## 2022-07-10 ENCOUNTER — Encounter: Payer: Self-pay | Admitting: Physical Therapy

## 2022-07-10 DIAGNOSIS — R600 Localized edema: Secondary | ICD-10-CM

## 2022-07-10 DIAGNOSIS — M25561 Pain in right knee: Secondary | ICD-10-CM

## 2022-07-10 DIAGNOSIS — R262 Difficulty in walking, not elsewhere classified: Secondary | ICD-10-CM

## 2022-07-10 NOTE — Therapy (Signed)
OUTPATIENT PHYSICAL THERAPY TREATMENT NOTE   Patient Name: Veronica [name] MRN: [id number] DOB:Sep 20, 1960, 62 y.o., female Today's Date: 07/10/2022  PCP: Martyn Malay REFERRING PROVIDER: Dr. Karlton Lemon   END OF SESSION:   PT End of Session - 07/10/22 1021     Visit Number 8    Number of Visits 14    Date for PT Re-Evaluation 08/15/22    Authorization Type [employer]    PT Start Time 1019    PT Stop Time 1100    PT Time Calculation (min) 41 min    Activity Tolerance Patient tolerated treatment well    Behavior During Therapy WFL for tasks assessed/performed                Past Medical History:  Diagnosis Date   Daily headache    Fibromyalgia    G6PD deficiency    outside labs--low level, no longer on HCQ   Glucose 6 phosphatase deficiency ([facility])    Hypertension    Prediabetes    Rheumatoid arthritis ([place])    SLE (systemic lupus erythematosus) ([place]) 1998   Past Surgical History:  Procedure Laterality Date   ABDOMINAL HYSTERECTOMY  09/2008   [place], 1995   CHOLECYSTECTOMY N/A 07/18/2012   Procedure: LAPAROSCOPIC CHOLECYSTECTOMY WITH INTRAOPERATIVE CHOLANGIOGRAM;  Surgeon: Gayland Curry, MD;  Location: [facility];  Service: General;  Laterality: N/A;   ERCP N/A 07/29/2012   Procedure: ENDOSCOPIC RETROGRADE CHOLANGIOPANCREATOGRAPHY (ERCP);  Surgeon: Beryle Beams, MD;  Location: [facility];  Service: Endoscopy;  Laterality: N/A;  Dr. Benson Norway said he would start this PT arond 1330( AW)   INCISION AND DRAINAGE Right 11/28/2015   "boil on upper arm; did this in dr's office"   INCISION AND DRAINAGE ABSCESS Right 12/02/2015   Procedure: INCISION AND DRAINAGE ABSCESS;  Surgeon: Judeth Horn, MD;  Location: [facility];  Service: General;  Laterality: Right;  Right axillary abscess   TUBAL LIGATION  1995   Patient Active Problem List   Diagnosis Date Noted   Esophageal dysphagia 04/15/2020   Mood disorder ([place]) 11/13/2019   Atherosclerosis of aorta  ([place]) 07/15/2019   Prediabetes 07/13/2019   Chronic abdominal pain 07/13/2019   Insomnia 12/15/2012   Essential hypertension, benign 04/10/2012   SLE (systemic lupus erythematosus) ([place]) 04/10/2012    REFERRING DIAG: Knee pain, Rt   THERAPY DIAG:  Acute pain of right knee  Difficulty in walking, not elsewhere classified  Localized edema  Rationale for Evaluation and Treatment Rehabilitation  PERTINENT HISTORY: see above   PRECAUTIONS: none   SUBJECTIVE:  SUBJECTIVE STATEMENT: Just a "tad" of pain in Rt knee.   PAIN:  Are you having pain? Yes: NPRS scale: 2/10 Pain location: anteromedial  R  Pain description: sore  Aggravating factors: standing, walking  Relieving factors: rest   OBJECTIVE:   PATIENT SURVEYS:  FOTO 42%, goal is 56%   06/20/22: 41% goal is 56%   COGNITION: Overall cognitive status: Within functional limits for tasks assessed                         SENSATION: WFL   EDEMA:  Circumferential: Rt 17 inch, Lt. 16 inch  Rt 15 inch  , Lt. 15.75 inch  MUSCLE LENGTH: Hamstrings: tight Thomas test: NT    POSTURE: No Significant postural limitations, mild genu valgus   PALPATION: Min TTP Rt medial joint line and posteriorly.  Discomfort with patellar mobs.    LOWER EXTREMITY ROM:   Active ROM Right eval Left eval Rt./Lt.  06/20/22  Hip flexion       Hip extension       Hip abduction       Hip adduction       Hip internal rotation       Hip external rotation       Knee flexion 126 132 120  Knee extension 4 0 7  Ankle dorsiflexion       Ankle plantarflexion       Ankle inversion       Ankle eversion        (Blank rows = not tested)   LOWER EXTREMITY MMT:   MMT Right eval Left eval Rt./Lt.  04/25/22 Rt 06/20/22  Hip flexion 4 4  5/5  Hip extension 4 4  4/5   Hip abduction 4 4 4-/5, NT  3+/5  Hip adduction        Hip internal rotation        Hip external rotation        Knee flexion 5 5  5/5  Knee extension 5 5  4/5 some discomfort   Ankle dorsiflexion        Ankle plantarflexion        Ankle inversion        Ankle eversion         (Blank rows = not tested)   LOWER EXTREMITY SPECIAL TESTS:  NT   FUNCTIONAL TESTS:  5 times sit to stand: 19 sec   06/20/22: 15 sec  2 minute walk test: 440 feet, pain minimal at 2nd lap  04/25/22 445 feet , limp    GAIT: Distance walked: 150 Assistive device utilized: None Level of assistance: Complete Independence Comments: very min limp, slower pace    [facility] Adult PT Treatment:                                                DATE: 07/10/22 Therapeutic Exercise: NuStep L 5 UE and LE  Lateral step ups x 15 , 6 inch step  Hip abduction x 15  Forward step ups 4 inch with 1 finger assist  Step downs 4 inch x 10 pain on Rt , done bilaterally  Leg press 1 plate x 10 , then 2 plates x 10 x 2 more sets  Single leg 1 plate with light LLE assist  3 way hip stretch with  strap   [facility] Adult PT Treatment:                                                DATE: 07/05/22 Therapeutic Exercise: SLR toe up x 10, Toe out x 10 SAQ 3#  Bridge x 10 Hip abduction x 15  Hip flexor stretch Hamstring stretch  Seated LAQ 3# 10 x 2  Standing heel raises 10 x 2  Calf stretch  Tandem stance Hamstring curls right 15 each  STS x 10  Nustep L5 x 5 minutes UE/LE   [facility] Adult PT Treatment:                                                DATE: 06/20/22 Therapeutic Exercise: NuStep L5 UE and LE for 6 min  SLR toes up and toes out x 12 each  Banded knee extension red band x 15  Hamstring stretch 30 sec x 2  Hip abduction x 15  Prone knee flexion  Prone hip extension 2 x 10  Sit to stand x 5 = 15 sec  Self Care: Importance of strength training    [facility] Adult PT Treatment:                                                DATE:  04/25/22 Therapeutic Exercise: Recumbent bike 5 min L3  SLR Rt LE 2 x 15 done bilaterally.  Hamstring Rt LE x 60 sec x 2 , then ITB x 1, 60 sec  Bridging  x 15  Sidelying hip abduction x 15  2 min walk test  Calf stretch on wall and then slantboard x 30 sec x 3 Lateral step up  Hip abduction x 15  Reverse step up  x 15 each side , 6 inch step  Squat wide knees x 15 at counter top  Tandem in corner on Airex each leg, added head turns and eyes closed for balance challenge                                                                                                                    PATIENT EDUCATION:  Education details: refreshed HEP, return, POC  Person educated: Patient Education method: Consulting civil engineer, Demonstration, Verbal cues, and Handouts Education comprehension: verbalized understanding and needs further education   HOME EXERCISE PROGRAM: Access Code: [email or web address] URL: [email or web address] Date: 03/07/2022 Prepared by: Raeford Razor   Exercises - Supine Active Straight Leg Raise  - 1 x daily - 7 x weekly - 2 sets - 10 reps - 5 hold - Straight Leg Raise with External Rotation  -  1 x daily - 7 x weekly - 2 sets - 10 reps - 5 hold - Sidelying Hip Abduction  - 1 x daily - 7 x weekly - 2 sets - 10 reps - 5 hold - Seated Hamstring Stretch  - 1 x daily - 7 x weekly - 1 sets - 3 reps - 30 hold - Standing Hip Abduction with Counter Support  - 1 x daily - 7 x weekly - 2 sets - 10 reps - 5 hold - Mini Squat with Chair  - 1 x daily - 7 x weekly - 2 sets - 10 reps - 5 hold  -Calf stretch 30 sec x 3-5    ASSESSMENT:   CLINICAL IMPRESSION: Patient overall doing well with minimal pain in her knee prior.  She has been doing her exercises but not going to the gym.  Focus more on closed chain exercises today.  Increased pain with step downs 4 inch step.  Will complete 3 more visits and then return to the doctor.  At this point she is not verbalizing any functional improvement  from when she began therapy this second time, but does indicate her knee is not as swollen as it was in the past.  Continue plan of care working on step up and knee stability, control.   OBJECTIVE IMPAIRMENTS: decreased mobility, difficulty walking, decreased ROM, decreased strength, increased edema, increased fascial restrictions, obesity, and pain.    ACTIVITY LIMITATIONS: lifting, bending, sitting, standing, squatting, stairs, and locomotion level   PARTICIPATION LIMITATIONS: cleaning, interpersonal relationship, shopping, community activity, and occupation   PERSONAL FACTORS: 1 comorbidity: obesity,also work schedule  are also affecting patient's functional outcome.    REHAB POTENTIAL: Excellent   CLINICAL DECISION MAKING: Stable/uncomplicated   EVALUATION COMPLEXITY: Low     GOALS: Goals reviewed with patient? Yes   SHORT TERM GOALS: Target date: 04/04/2022  Pt will be Independent home exercise program Baseline: given on eval  Status: Pt is not compliant as of yet due to work  Goal status: ONGOING   2.  Pt will be able to resume walking routine with min increase in knee pain  Baseline:  Status: has returned to walking,  2/21 has stopped   Goal status:ONGOING    3.  Pt will verbalize 3 RICE, self care strategies for pain control Baseline:  Status: 04/02/22: Rest, elevate, hot shower Goal status: MET     LONG TERM GOALS: Target date: 08/15/2022    Pt will complete HEP  Baseline:  Goal status: INITIAL   2.  Pt will demo 5/5 hip abduction and extension strength bilateral to optimize gait mechanics Baseline:  Goal status: INITIAL   3.  Pt will be able to sit for 30 minutes and no stiffness upon standing   Baseline: moderate  Goal status:ongoing    4.  Patient will get up and down from the floor without difficulty Baseline:  Goal status: ongoing    5.  Patient will return to doing stairs at work without increased pain or fear when not using rails Baseline:  Goal  status: ongoing    6.  Patient will perform 2-minute walk test without pain and ability to walk 500 feet.  Baseline:  Goal status: INITIAL     PLAN:   PT FREQUENCY: every other week 2 x each week when not working (4 visits per month)    PT DURATION: 8 weeks   PLANNED INTERVENTIONS: Therapeutic exercises, Therapeutic activity, Neuromuscular re-education, Balance training, Gait training, Patient/Family  education, Self Care, Joint mobilization, Stair training, Cryotherapy, Moist heat, Taping, Manual therapy, and Re-evaluation   PLAN FOR NEXT SESSION:     Hip strength, reducing limp is her pain goal.  bike vs TM, closed chain when able , step ups Raeford Razor, PT 07/10/22 11:50 AM Phone: [phone] Fax: [phone]  Fax: [phone]

## 2022-07-13 ENCOUNTER — Other Ambulatory Visit: Payer: Self-pay | Admitting: *Deleted

## 2022-07-13 MED ORDER — TOPIRAMATE 25 MG PO TABS: ORAL_TABLET | ORAL | 2 refills | Status: DC

## 2022-07-16 ENCOUNTER — Ambulatory Visit: Payer: 59 | Admitting: Family Medicine

## 2022-07-17 NOTE — Therapy (Unsigned)
OUTPATIENT PHYSICAL THERAPY TREATMENT NOTE   Patient Name: Veronica [name] MRN: [id number] DOB:Sep 26, 1960, 62 y.o., female Today's Date: 07/18/2022  PCP: Martyn Malay REFERRING PROVIDER: Dr. Karlton Lemon   END OF SESSION:   PT End of Session - 07/18/22 1021     Visit Number 9    Number of Visits 14    Date for PT Re-Evaluation 08/15/22    Authorization Type [facility], M[employer]    Authorization - Visit Number 4    Authorization - Number of Visits 5   insurance needs auth after 5 th visit [facility]   PT Start Time 1022   pt late   PT Stop Time 1100    PT Time Calculation (min) 38 min    Activity Tolerance Patient tolerated treatment well;No increased pain    Behavior During Therapy WFL for tasks assessed/performed                 Past Medical History:  Diagnosis Date   Daily headache    Fibromyalgia    G6PD deficiency    outside labs--low level, no longer on HCQ   Glucose 6 phosphatase deficiency (HCC)    Hypertension    Prediabetes    Rheumatoid arthritis ([place])    SLE (systemic lupus erythematosus) ([place]) 1998   Past Surgical History:  Procedure Laterality Date   ABDOMINAL HYSTERECTOMY  09/2008   [place], 1995   CHOLECYSTECTOMY N/A 07/18/2012   Procedure: LAPAROSCOPIC CHOLECYSTECTOMY WITH INTRAOPERATIVE CHOLANGIOGRAM;  Surgeon: Gayland Curry, MD;  Location: [facility];  Service: General;  Laterality: N/A;   ERCP N/A 07/29/2012   Procedure: ENDOSCOPIC RETROGRADE CHOLANGIOPANCREATOGRAPHY (ERCP);  Surgeon: Beryle Beams, MD;  Location: [facility];  Service: Endoscopy;  Laterality: N/A;  Dr. Benson Norway said he would start this PT arond 1330( AW)   INCISION AND DRAINAGE Right 11/28/2015   "boil on upper arm; did this in dr's office"   INCISION AND DRAINAGE ABSCESS Right 12/02/2015   Procedure: INCISION AND DRAINAGE ABSCESS;  Surgeon: Judeth Horn, MD;  Location: [facility];  Service: General;  Laterality: Right;  Right axillary abscess   TUBAL LIGATION  1995    Patient Active Problem List   Diagnosis Date Noted   Esophageal dysphagia 04/15/2020   Mood disorder ([place]) 11/13/2019   Atherosclerosis of aorta ([place]) 07/15/2019   Prediabetes 07/13/2019   Chronic abdominal pain 07/13/2019   Insomnia 12/15/2012   Essential hypertension, benign 04/10/2012   SLE (systemic lupus erythematosus) ([place]) 04/10/2012    REFERRING DIAG: Knee pain, Rt   THERAPY DIAG:  Acute pain of right knee  Difficulty in walking, not elsewhere classified  Localized edema  Rationale for Evaluation and Treatment Rehabilitation  PERTINENT HISTORY: see above   PRECAUTIONS: none   SUBJECTIVE:  SUBJECTIVE STATEMENT: Not hurting right now.  Worked last night.  It hurts the most when going up and down stairs.  That part is not getting better.   PAIN:  Are you having pain? Yes: NPRS scale: 2/10 Pain location: anteromedial  R  Pain description: sore  Aggravating factors: standing, walking  Relieving factors: rest   OBJECTIVE:   PATIENT SURVEYS:  FOTO 42%, goal is 56%   06/20/22: 41% goal is 56%   COGNITION: Overall cognitive status: Within functional limits for tasks assessed                         SENSATION: WFL   EDEMA:  Circumferential: Rt 17 inch, Lt. 16 inch  Rt 15 inch  , Lt. 15.75 inch  MUSCLE LENGTH: Hamstrings: tight Thomas test: NT    POSTURE: No Significant postural limitations, mild genu valgus   PALPATION: Min TTP Rt medial joint line and posteriorly.  Discomfort with patellar mobs.    LOWER EXTREMITY ROM:   Active ROM Right eval Left eval Rt./Lt.  06/20/22  Hip flexion       Hip extension       Hip abduction       Hip adduction       Hip internal rotation       Hip external rotation       Knee flexion 126 132 120  Knee extension 4 0 7  Ankle  dorsiflexion       Ankle plantarflexion       Ankle inversion       Ankle eversion        (Blank rows = not tested)   LOWER EXTREMITY MMT:   MMT Right eval Left eval Rt./Lt.  04/25/22 Rt 06/20/22  Hip flexion 4 4  5/5  Hip extension 4 4  4/5  Hip abduction 4 4 4-/5, NT  3+/5  Hip adduction        Hip internal rotation        Hip external rotation        Knee flexion 5 5  5/5  Knee extension 5 5  4/5 some discomfort   Ankle dorsiflexion        Ankle plantarflexion        Ankle inversion        Ankle eversion         (Blank rows = not tested)   LOWER EXTREMITY SPECIAL TESTS:  NT   FUNCTIONAL TESTS:  5 times sit to stand: 19 sec   06/20/22: 15 sec  2 minute walk test: 440 feet, pain minimal at 2nd lap  04/25/22 445 feet , limp    GAIT: Distance walked: 150 Assistive device utilized: None Level of assistance: Complete Independence Comments: very min limp, slower pace   [facility] Adult PT Treatment:                                                DATE: 07/18/22 Therapeutic Exercise: Recumbent bike L1 for 6 min  Standing step downs, tapping one leg back off step  Hip abduction sidefacing off step Supine SLR 3 lbs x 15, toes up then toes out 2nd set x 10  Adduction and Abduction SLR Rt LE x 15 no wgt LAQ 6 lbs with ball squeeze x 15  Sit to  stand blue band around thighs  x 15 no pain knee  Lateral band walking blue band 4 x 20 feet   [facility] Adult PT Treatment:                                                DATE: 07/10/22 Therapeutic Exercise: NuStep L 5 UE and LE  Lateral step ups x 15 , 6 inch step  Hip abduction x 15  Forward step ups 4 inch with 1 finger assist  Step downs 4 inch x 10 pain on Rt , done bilaterally  Leg press 1 plate x 10 , then 2 plates x 10 x 2 more sets  Single leg 1 plate with light LLE assist  3 way hip stretch with strap   [facility] Adult PT Treatment:                                                DATE: 07/05/22 Therapeutic Exercise: SLR toe up x 10,  Toe out x 10 SAQ 3#  Bridge x 10 Hip abduction x 15  Hip flexor stretch Hamstring stretch  Seated LAQ 3# 10 x 2  Standing heel raises 10 x 2  Calf stretch  Tandem stance Hamstring curls right 15 each  STS x 10  Nustep L5 x 5 minutes UE/LE   [facility] Adult PT Treatment:                                                DATE: 06/20/22 Therapeutic Exercise: NuStep L5 UE and LE for 6 min  SLR toes up and toes out x 12 each  Banded knee extension red band x 15  Hamstring stretch 30 sec x 2  Hip abduction x 15  Prone knee flexion  Prone hip extension 2 x 10  Sit to stand x 5 = 15 sec  Self Care: Importance of strength training    [facility] Adult PT Treatment:                                                DATE: 04/25/22 Therapeutic Exercise: Recumbent bike 5 min L3  SLR Rt LE 2 x 15 done bilaterally.  Hamstring Rt LE x 60 sec x 2 , then ITB x 1, 60 sec  Bridging  x 15  Sidelying hip abduction x 15  2 min walk test  Calf stretch on wall and then slantboard x 30 sec x 3 Lateral step up  Hip abduction x 15  Reverse step up  x 15 each side , 6 inch step  Squat wide knees x 15 at counter top  Tandem in corner on [employer] each leg, added head turns and eyes closed for balance challenge  PATIENT EDUCATION:  Education details: refreshed HEP, return, POC  Person educated: Patient Education method: Consulting civil engineer, Demonstration, Verbal cues, and Handouts Education comprehension: verbalized understanding and needs further education   HOME EXERCISE PROGRAM: Access Code: [email or web address] URL: [email or web address] Date: 03/07/2022 Prepared by: Raeford Razor   Exercises - Supine Active Straight Leg Raise  - 1 x daily - 7 x weekly - 2 sets - 10 reps - 5 hold - Straight Leg Raise with External Rotation  - 1 x daily - 7 x weekly - 2 sets - 10 reps - 5 hold - Sidelying Hip Abduction   - 1 x daily - 7 x weekly - 2 sets - 10 reps - 5 hold - Seated Hamstring Stretch  - 1 x daily - 7 x weekly - 1 sets - 3 reps - 30 hold - Standing Hip Abduction with Counter Support  - 1 x daily - 7 x weekly - 2 sets - 10 reps - 5 hold - Mini Squat with Chair  - 1 x daily - 7 x weekly - 2 sets - 10 reps - 5 hold  -Calf stretch 30 sec x 3-5  -Hip adduction as above x 10 x 2    ASSESSMENT:   CLINICAL IMPRESSION: Patient with min knee pain overall, limited in stepping up and down with Rt LE .  She shows weakness in her Rt adduction/VMO and abduction.  She needs to push her walking a bit more and activity overall.  Squat and lateral band walking did not aggravate her knee pain.  Considered patellar taping but no patellar grinding or pain with mobilizing . Cont POC.  Will need auth past her 5 th visit which is next week.    OBJECTIVE IMPAIRMENTS: decreased mobility, difficulty walking, decreased ROM, decreased strength, increased edema, increased fascial restrictions, obesity, and pain.    ACTIVITY LIMITATIONS: lifting, bending, sitting, standing, squatting, stairs, and locomotion level   PARTICIPATION LIMITATIONS: cleaning, interpersonal relationship, shopping, community activity, and occupation   PERSONAL FACTORS: 1 comorbidity: obesity,also work schedule  are also affecting patient's functional outcome.    REHAB POTENTIAL: Excellent   CLINICAL DECISION MAKING: Stable/uncomplicated   EVALUATION COMPLEXITY: Low     GOALS: Goals reviewed with patient? Yes   SHORT TERM GOALS: Target date: 04/04/2022  Pt will be Independent home exercise program Baseline: given on eval  Status: needs reinforcement  Goal status: ONGOING   2.  Pt will be able to resume walking routine with min increase in knee pain  Baseline:  Status: has returned to walking 1 block at a time without knee pain.  Goal status: MET    3.  Pt will verbalize 3 RICE, self care strategies for pain control Baseline:  Status:  04/02/22: Rest, elevate, hot shower Goal status: MET     LONG TERM GOALS: Target date: 08/15/2022    Pt will complete HEP  Baseline:  Goal status: INITIAL   2.  Pt will demo 5/5 hip abduction and extension strength bilateral to optimize gait mechanics Baseline:  Goal status: INITIAL   3.  Pt will be able to sit for 30 minutes and no stiffness upon standing   Baseline: moderate  Goal status:ongoing    4.  Patient will get up and down from the floor without difficulty Baseline: unable to do this before knee hurt.  Goal status: ongoing    5.  Patient will return to doing stairs at work without increased pain or fear when not using rails Baseline: 07/18/22  knee  Goal status: ongoing    6.  Patient will perform 2-minute walk test without pain and ability to walk 500 feet.  Baseline:  Goal status: ongoing      PLAN:   PT FREQUENCY: every other week 2 x each week when not working (4 visits per month)    PT DURATION: 8 weeks   PLANNED INTERVENTIONS: Therapeutic exercises, Therapeutic activity, Neuromuscular re-education, Balance training, Gait training, Patient/Family education, Self Care, Joint mobilization, Stair training, Cryotherapy, Moist heat, Taping, Manual therapy, and Re-evaluation   PLAN FOR NEXT SESSION:    RE_AUTH  Hip strength, reducing limp is her pain goal.  bike vs TM, closed chain when able , step ups   Raeford Razor, PT 07/18/22 11:04 AM Phone: [phone] Fax: [phone]

## 2022-07-18 ENCOUNTER — Ambulatory Visit: Payer: 59 | Admitting: Physical Therapy

## 2022-07-18 ENCOUNTER — Encounter: Payer: Self-pay | Admitting: Physical Therapy

## 2022-07-18 DIAGNOSIS — R262 Difficulty in walking, not elsewhere classified: Secondary | ICD-10-CM

## 2022-07-18 DIAGNOSIS — M25561 Pain in right knee: Secondary | ICD-10-CM

## 2022-07-18 DIAGNOSIS — R600 Localized edema: Secondary | ICD-10-CM

## 2022-07-26 ENCOUNTER — Other Ambulatory Visit: Payer: Self-pay | Admitting: *Deleted

## 2022-07-26 MED ORDER — ATORVASTATIN CALCIUM 10 MG PO TABS: 10.0000 mg | ORAL_TABLET | Freq: Every day | ORAL | 3 refills | Status: DC

## 2022-07-30 NOTE — Therapy (Addendum)
OUTPATIENT PHYSICAL THERAPY TREATMENT NOTE DISCHARGE   Patient Name: Veronica [name] MRN: [id number] DOB:[birth year], 62 y.o., female Today's Date: 07/31/2022  PCP: Westley Chandler REFERRING PROVIDER: Dr. Norton Blizzard   END OF SESSION:   PT End of Session - 07/31/22 1014     Visit Number 10    Number of Visits 16    Date for PT Re-Evaluation 08/28/22    Authorization Type [employer], M[employer]    Authorization - Visit Number 5    Authorization - Number of Visits 5    PT Start Time 1012    PT Stop Time 1055    PT Time Calculation (min) 43 min    Activity Tolerance Patient tolerated treatment well;No increased pain    Behavior During Therapy WFL for tasks assessed/performed                  Past Medical History:  Diagnosis Date   Daily headache    Fibromyalgia    G6PD deficiency    outside labs--low level, no longer on HCQ   Glucose 6 phosphatase deficiency    Hypertension    Prediabetes    Rheumatoid arthritis    SLE (systemic lupus erythematosus) 1998   Past Surgical History:  Procedure Laterality Date   ABDOMINAL HYSTERECTOMY  09/2008   CESAREAN SECTION  1992, 1995   CHOLECYSTECTOMY N/A 07/18/2012   Procedure: LAPAROSCOPIC CHOLECYSTECTOMY WITH INTRAOPERATIVE CHOLANGIOGRAM;  Surgeon: Atilano Ina, MD;  Location: [facility];  Service: General;  Laterality: N/A;   ERCP N/A 07/29/2012   Procedure: ENDOSCOPIC RETROGRADE CHOLANGIOPANCREATOGRAPHY (ERCP);  Surgeon: Theda Belfast, MD;  Location: [facility];  Service: Endoscopy;  Laterality: N/A;  Dr. Elnoria Howard said he would start this PT arond 1330( AW)   INCISION AND DRAINAGE Right 11/28/2015   "boil on upper arm; did this in dr's office"   INCISION AND DRAINAGE ABSCESS Right 12/02/2015   Procedure: INCISION AND DRAINAGE ABSCESS;  Surgeon: Jimmye Norman, MD;  Location[facility];  Service: General;  Laterality: Right;  Right axillary abscess   TUBAL LIGATION  1995   Patient Active Problem List   Diagnosis Date Noted    Esophageal dysphagia 04/15/2020   Mood disorder 11/13/2019   Atherosclerosis of aorta 07/15/2019   Prediabetes 07/13/2019   Chronic abdominal pain 07/13/2019   Insomnia 12/15/2012   Essential hypertension, benign 04/10/2012   SLE (systemic lupus erythematosus) 04/10/2012    REFERRING DIAG: Knee pain, Rt   THERAPY DIAG:  Acute pain of right knee  Difficulty in walking, not elsewhere classified  Localized edema  Rationale for Evaluation and Treatment Rehabilitation  PERTINENT HISTORY: see above   PRECAUTIONS: none   SUBJECTIVE:  SUBJECTIVE STATEMENT: Still difficulty with steps up and down.  Sitting too long increases pain when standing.  I'd rather stand but that gets tiring.   PAIN:  Are you having pain? Yes: NPRS scale: 1-2/10 Pain location: anteromedial  R  Pain description: sore  Aggravating factors: standing, walking  Relieving factors: rest   OBJECTIVE:   PATIENT SURVEYS:  FOTO 42%, goal is 56%   06/20/22: 41% goal is 56%   COGNITION: Overall cognitive status: Within functional limits for tasks assessed                         SENSATION: WFL   EDEMA:  Circumferential: Rt 17 inch, Lt. 16 inch  Rt 15 inch  , Lt. 15.75 inch  MUSCLE LENGTH: Hamstrings: tight Thomas test: NT    POSTURE: No Significant postural limitations, mild genu valgus   PALPATION: Min TTP Rt medial joint line and posteriorly.  Discomfort with patellar mobs.    LOWER EXTREMITY ROM:   Active ROM Right eval Left eval Rt./Lt.  06/20/22 Rt.  07/31/22  Hip flexion        Hip extension        Hip abduction        Hip adduction        Hip internal rotation        Hip external rotation        Knee flexion 126 132 120 117 active then 134 deg AAROM  Knee extension 4 0 7 7  Ankle dorsiflexion        Ankle  plantarflexion        Ankle inversion        Ankle eversion         (Blank rows = not tested)   LOWER EXTREMITY MMT:   MMT Right eval Left eval Rt./Lt.  04/25/22 Rt 06/20/22 Rt.  07/31/22  Hip flexion 4 4  5/5 5/5  Hip extension 4 4  4/5 4/5  Hip abduction 4 4 4-/5, NT  3+/5 3+/5  Hip adduction         Hip internal rotation         Hip external rotation         Knee flexion 5 5  5/5 5/5  Knee extension 5 5  4/5 some discomfort  4/5 gives way due to pain   Ankle dorsiflexion         Ankle plantarflexion         Ankle inversion         Ankle eversion          (Blank rows = not tested)   LOWER EXTREMITY SPECIAL TESTS:  NT   FUNCTIONAL TESTS:  5 times sit to stand: 19 sec   06/20/22: 15 sec  2 minute walk test: 440 feet, pain minimal at 2nd lap  04/25/22 445 feet , limp    GAIT: Distance walked: 150 Assistive device utilized: None Level of assistance: Complete Independence Comments: very min limp, slower pace     [facility] Adult PT Treatment:                                                DATE: 07/31/22 Therapeutic Exercise: Treadmill up to 1.3 mph- 1.5 mph and 3% grade for 6 min  MMT done  ROM Rt  knee  Hip abduction S/L and sidekicks , max cues for form  Hamstring stretch and calf with strap 60 sec  Knee flexion / asst heel slide  x 5, 10 sec hold  Banded knee ext red band x 15 then pulse x 10  Figure 4 bridge x 10 each side  Sit to stand blue band x 10 no UE  Squat blue band x 12 x 2 sets  Hip hinge blue band under feet  x 10  Standing hip abd red x 15 Standing hip ext x 15 red band  Step ups 6 inch x 20 each LE Leg press (OMEGA) 25 lbs Rt LE x 15    [facility] Adult PT Treatment:                                                DATE: 07/18/22 Therapeutic Exercise: Recumbent bike L1 for 6 min  Standing step downs, tapping one leg back off step  Hip abduction sidefacing off step Supine SLR 3 lbs x 15, toes up then toes out 2nd set x 10  Adduction and Abduction SLR Rt LE x  15 no wgt LAQ 6 lbs with ball squeeze x 15  Sit to stand blue band around thighs  x 15 no pain knee  Lateral band walking blue band 4 x 20 feet   [facility] Adult PT Treatment:                                                DATE: 07/10/22 Therapeutic Exercise: NuStep L 5 UE and LE  Lateral step ups x 15 , 6 inch step  Hip abduction x 15  Forward step ups 4 inch with 1 finger assist  Step downs 4 inch x 10 pain on Rt , done bilaterally  Leg press 1 plate x 10 , then 2 plates x 10 x 2 more sets  Single leg 1 plate with light LLE assist  3 way hip stretch with strap   [facility] Adult PT Treatment:                                                DATE: 07/05/22 Therapeutic Exercise: SLR toe up x 10, Toe out x 10 SAQ 3#  Bridge x 10 Hip abduction x 15  Hip flexor stretch Hamstring stretch  Seated LAQ 3# 10 x 2  Standing heel raises 10 x 2  Calf stretch  Tandem stance Hamstring curls right 15 each  STS x 10  Nustep L5 x 5 minutes UE/LE   PATIENT EDUCATION:  Education details: refreshed HEP, return, POC  Person educated: Patient Education method: Consulting civil engineer, Demonstration, Verbal cues, and Handouts Education comprehension: verbalized understanding and needs further education   HOME EXERCISE PROGRAM: Access Code: [email or web address] URL: [email or web address] Date: 03/07/2022 Prepared by: Raeford Razor   Exercises - Supine Active Straight Leg Raise  - 1 x daily - 7 x weekly - 2 sets - 10 reps - 5 hold - Straight Leg Raise with External Rotation  - 1 x daily - 7 x weekly - 2 sets -  10 reps - 5 hold - Sidelying Hip Abduction  - 1 x daily - 7 x weekly - 2 sets - 10 reps - 5 hold - Seated Hamstring Stretch  - 1 x daily - 7 x weekly - 1 sets - 3 reps - 30 hold - Standing Hip Abduction with Counter Support  - 1 x daily - 7 x weekly - 2 sets - 10 reps - 5 hold - Mini Squat with Chair  - 1 x daily - 7 x weekly - 2 sets - 10 reps - 5 hold  -Calf stretch 30 sec x 3-5  -Hip adduction as above x  10 x 2    ASSESSMENT:   CLINICAL IMPRESSION: Patient is making minimal gains overall, has continued difficulty negotiating stairs due to pain.  She continues to have weakness in her hips and mild deficits in quad control.  She reports walking more outside of PT but only about 1/2 blocks.   I recommended she returning to [employer] and continue PT for about 6 more visit emphasizing strength.  Will submit for reauthorization today.  She is seeing Dr. Barbaraann Barthel 08/06/22.    OBJECTIVE IMPAIRMENTS: decreased mobility, difficulty walking, decreased ROM, decreased strength, increased edema, increased fascial restrictions, obesity, and pain.    ACTIVITY LIMITATIONS: lifting, bending, sitting, standing, squatting, stairs, and locomotion level   PARTICIPATION LIMITATIONS: cleaning, interpersonal relationship, shopping, community activity, and occupation   PERSONAL FACTORS: 1 comorbidity: obesity,also work schedule  are also affecting patient's functional outcome.    REHAB POTENTIAL: Excellent   CLINICAL DECISION MAKING: Stable/uncomplicated   EVALUATION COMPLEXITY: Low     GOALS: Goals reviewed with patient? Yes   SHORT TERM GOALS: Target date: 04/04/2022  Pt will be Independent home exercise program Baseline: given on eval  Status: needs reinforcement  Goal status: ONGOING   2.  Pt will be able to resume walking routine with min increase in knee pain  Baseline:  Status: has returned to walking 1 block at a time without knee pain.  Goal status: MET    3.  Pt will verbalize 3 RICE, self care strategies for pain control Baseline:  Status: 04/02/22: Rest, elevate, hot shower Goal status: MET     LONG TERM GOALS: Target date: 08/15/2022    Pt will complete HEP  Baseline:  Goal status: INITIAL   2.  Pt will demo 5/5 hip abduction and extension strength bilateral to optimize gait mechanics Baseline: 3+/5 Goal status: ongoing    3.  Pt will be able to sit for 30 minutes and no  stiffness upon standing   Baseline: moderate  Goal status:ongoing    4.  Patient will get up and down from the floor without difficulty Baseline: unable to do this before knee hurt.  Goal status: DC   5.  Patient will return to doing stairs at work without increased pain or fear when not using rails Baseline: 07/18/22 knee  Goal status: ongoing    6.  Patient will perform 2-minute walk test without pain and ability to walk 500 feet.  Baseline:  Goal status: ongoing      PLAN:   PT FREQUENCY: every other week 2 x each week when not working (4 visits per month)    PT DURATION: 8 weeks   PLANNED INTERVENTIONS: Therapeutic exercises, Therapeutic activity, Neuromuscular re-education, Balance training, Gait training, Patient/Family education, Self Care, Joint mobilization, Stair training, Cryotherapy, Moist heat, Taping, Manual therapy, and Re-evaluation   PLAN FOR NEXT SESSION:  What did MD say  Hip strength, reducing limp is her pain goal.  bike vs TM, closed chain when able , step ups  Check all possible CPT codes: 10960 - PT Re-evaluation, 97110- Therapeutic Exercise, [phone]- Neuro Re-education, [phone] - Gait Training, [phone] - Manual Therapy, 97530 - Therapeutic Activities, [phone] - Self Care, and [phone] - Physical performance training    Check all conditions that are expected to impact treatment: Unknown   If treatment provided at initial evaluation, no treatment charged due to lack of authorization.      Karie Mainland, PT 07/31/22 1:19 PM Phone: [phone] Fax: [phone]   PHYSICAL THERAPY DISCHARGE SUMMARY  Visits from Start of Care: 10  Current functional level related to goals / functional outcomes: Unknown    Remaining deficits: unknown   Education / Equipment: HEP, activity, knee pain, Self care    Patient agrees to discharge. Patient goals were partially met. Patient is being discharged due to not returning since the last visit.  Karie Mainland, PT 10/08/22  10:50 AM Phone: [phone] Fax: [phone]

## 2022-07-31 ENCOUNTER — Encounter: Payer: Self-pay | Admitting: Physical Therapy

## 2022-07-31 ENCOUNTER — Ambulatory Visit: Payer: 59 | Attending: Family Medicine | Admitting: Physical Therapy

## 2022-07-31 DIAGNOSIS — R600 Localized edema: Secondary | ICD-10-CM | POA: Diagnosis present

## 2022-07-31 DIAGNOSIS — R262 Difficulty in walking, not elsewhere classified: Secondary | ICD-10-CM | POA: Insufficient documentation

## 2022-07-31 DIAGNOSIS — M25561 Pain in right knee: Secondary | ICD-10-CM | POA: Insufficient documentation

## 2022-08-02 NOTE — Progress Notes (Signed)
    SUBJECTIVE:   CHIEF COMPLAINT: annual exam  HPI:   Veronica [name] is a 62 y.o.  with history notable for mood disorder, obesity, and prediabetes presenting for check up.  She reports overall she is doing okay. She got new insurance and her Rheumatologist and GI do not accept this insurance.   She reports 2 months of 'elective shocks'. This occurs up to several times a day, always on the right side. Includes both upper and lower extremity. Last a few seconds. Describes this as a shock.Occurs when sitting or standing. No inciting factors. No headaches, bowel/bladder symptoms, symptoms are not changed with temperature or shower. No numbness or tingling. No new medications. She does take Topamax. Has a sister with MS.   The patient reports 2 days of intermittent dizziness. Occurs upon standing. No syncope, CP, palpitations, dyspnea. No hematochezia. Has had reduced water intake over last 2 days. She is taking medications as prescribed.   She has been going to PT and reports some knee pain. She is going to try PT and see if she can avoid surgery.  The patient has a history of prediabetes. She is  trying to stay active and reduce sugary beverages. She is interested in losing additional weight especially prior to potentially having knee surgery.    PERTINENT  PMH / PSH/Family/Social History :  Prediabetes Mood disorder   OBJECTIVE:   BP 138/82   Pulse 87   Ht 5\' 4"  (1.626 m)   Wt 236 lb (107 kg)   SpO2 97%   BMI 40.51 kg/m   Today's weight:  Last Weight  Most recent update: 08/03/2022 10:18 AM    Weight  107 kg (236 lb)            Review of prior weights: [employer]   08/03/22 1018  Weight: 236 lb (107 kg)    Neuro: CN II: PERRL CN III, IV,VI: EOMI CV V: Normal sensation in V1, V2, V3 CVII: Symmetric smile and brow raise CN VIII: Normal hearing CN IX,X: Symmetric palate raise  CN XI: 5/5 shoulder shrug CN XII: Symmetric tongue protrusion  UE and LE strength  5/5 2+ UE and LE reflexes  Normal sensation in UE and LE bilaterally  No ataxia with finger to nose, normal heel to shin   Cardiac: Regular rate and rhythm. Normal S1/S2. No murmurs, rubs, or gallops appreciated. Lungs: Clear bilaterally to ascultation.  Abdomen: Normoactive bowel sounds. No tenderness to deep or light palpation. No rebound or guarding.    Psych: Pleasant and appropriate    ASSESSMENT/PLAN:   Mood disorder (HCC) Doing well, no longer taking Buspar   Esophageal dysphagia Referred to Gastroenterology given change in insurance   SLE (systemic lupus erythematosus) (HCC) [name] no longer takes insurance  New referral placed   Electric Shock Sensation (? Lhermitte's) - Possibly due to Lupus - Will check B12, TSH - Also considered MS given family history - Also considered C-spine disease - She will message Dr. Everlena Cooper   Intermittent Dizziness Suspect orthostasis Encourage PO hydration Will check CBC, CMP   Prediabetes Will check insulin level, A1C 5.7 Congratulated on lifestyle changes Consider semaglutide pending results  HCM Pap- NA Mammogram in fall UTD on vaccines Colonoscopy 2027     [name], MD  Family Medicine Teaching Service  [facility]

## 2022-08-03 ENCOUNTER — Encounter: Payer: Self-pay | Admitting: Family Medicine

## 2022-08-03 ENCOUNTER — Ambulatory Visit (INDEPENDENT_AMBULATORY_CARE_PROVIDER_SITE_OTHER): Payer: 59 | Admitting: Family Medicine

## 2022-08-03 VITALS — BP 138/82 | HR 87 | Ht 64.0 in | Wt 236.0 lb

## 2022-08-03 DIAGNOSIS — R7303 Prediabetes: Secondary | ICD-10-CM

## 2022-08-03 DIAGNOSIS — K5904 Chronic idiopathic constipation: Secondary | ICD-10-CM

## 2022-08-03 DIAGNOSIS — F39 Unspecified mood [affective] disorder: Secondary | ICD-10-CM

## 2022-08-03 DIAGNOSIS — R42 Dizziness and giddiness: Secondary | ICD-10-CM

## 2022-08-03 DIAGNOSIS — Z0000 Encounter for general adult medical examination without abnormal findings: Secondary | ICD-10-CM | POA: Diagnosis not present

## 2022-08-03 DIAGNOSIS — R202 Paresthesia of skin: Secondary | ICD-10-CM | POA: Diagnosis not present

## 2022-08-03 DIAGNOSIS — I10 Essential (primary) hypertension: Secondary | ICD-10-CM

## 2022-08-03 DIAGNOSIS — M329 Systemic lupus erythematosus, unspecified: Secondary | ICD-10-CM

## 2022-08-03 DIAGNOSIS — R1319 Other dysphagia: Secondary | ICD-10-CM

## 2022-08-03 DIAGNOSIS — J302 Other seasonal allergic rhinitis: Secondary | ICD-10-CM

## 2022-08-03 LAB — POCT GLYCOSYLATED HEMOGLOBIN (HGB A1C): HbA1c, POC (prediabetic range): 5.7 % (ref 5.7–6.4)

## 2022-08-03 MED ORDER — CETIRIZINE HCL 10 MG PO TABS: 10.0000 mg | ORAL_TABLET | Freq: Every day | ORAL | 11 refills | Status: DC

## 2022-08-03 MED ORDER — ATORVASTATIN CALCIUM 10 MG PO TABS: 10.0000 mg | ORAL_TABLET | Freq: Every day | ORAL | 3 refills | Status: DC

## 2022-08-03 MED ORDER — POLYETHYLENE GLYCOL 3350 17 GM/SCOOP PO POWD
17.0000 g | Freq: Two times a day (BID) | ORAL | 1 refills | Status: AC | PRN
Start: 2022-08-03 — End: ?

## 2022-08-03 MED ORDER — SPIRONOLACTONE 25 MG PO TABS: 25.0000 mg | ORAL_TABLET | Freq: Every day | ORAL | 3 refills | Status: DC

## 2022-08-03 MED ORDER — LOSARTAN POTASSIUM 100 MG PO TABS: 100.0000 mg | ORAL_TABLET | Freq: Every day | ORAL | 3 refills | Status: DC

## 2022-08-03 MED ORDER — FLUOXETINE HCL 20 MG PO CAPS: 20.0000 mg | ORAL_CAPSULE | Freq: Every day | ORAL | 3 refills | Status: DC

## 2022-08-03 MED ORDER — POTASSIUM CHLORIDE CRYS ER 20 MEQ PO TBCR: 20.0000 meq | EXTENDED_RELEASE_TABLET | Freq: Every day | ORAL | 3 refills | Status: DC

## 2022-08-03 MED ORDER — TOPIRAMATE 25 MG PO TABS: ORAL_TABLET | ORAL | 2 refills | Status: DC

## 2022-08-03 MED ORDER — INDAPAMIDE 2.5 MG PO TABS: 2.5000 mg | ORAL_TABLET | Freq: Every day | ORAL | 2 refills | Status: DC

## 2022-08-03 NOTE — Assessment & Plan Note (Signed)
[name] no longer takes insurance  New referral placed

## 2022-08-03 NOTE — Assessment & Plan Note (Signed)
Doing well, no longer taking Buspar

## 2022-08-03 NOTE — Patient Instructions (Signed)
It was wonderful to see you today.  Please bring ALL of your medications with you to every visit.   Today we talked about:   - Getting blood work  - I recommending calling Dr. Everlena Cooper about the shocks you are having  Please drink at least 8 glasses of water today  I will send in [facility] pending your labs   I sent in new referrals for Rheumatology and Gastroenterology   Please follow up in 1  month to check to see how your weight is doing    Thank you for choosing [facility].   Please call [phone] with any questions about today's appointment.  Please be sure to schedule follow up at the front  desk before you leave today.   [name], MD  Family Medicine

## 2022-08-03 NOTE — Assessment & Plan Note (Signed)
Referred to Gastroenterology given change in insurance

## 2022-08-04 ENCOUNTER — Other Ambulatory Visit: Payer: Self-pay | Admitting: Family Medicine

## 2022-08-04 LAB — COMPREHENSIVE METABOLIC PANEL
ALT: 15 IU/L (ref 0–32)
AST: 20 IU/L (ref 0–40)
Albumin/Globulin Ratio: 1.2 (ref 1.2–2.2)
Albumin: 4.1 g/dL (ref 3.8–4.9)
Alkaline Phosphatase: 61 IU/L (ref 44–121)
BUN/Creatinine Ratio: 14 (ref 9–23)
BUN: 13 mg/dL (ref 6–24)
Bilirubin Total: 0.3 mg/dL (ref 0.0–1.2)
CO2: 21 mmol/L (ref 20–29)
Calcium: 9.8 mg/dL (ref 8.7–10.2)
Chloride: 100 mmol/L (ref 96–106)
Creatinine, Ser: 0.95 mg/dL (ref 0.57–1.00)
Globulin, Total: 3.5 g/dL (ref 1.5–4.5)
Glucose: 96 mg/dL (ref 70–99)
Potassium: 4.2 mmol/L (ref 3.5–5.2)
Sodium: 137 mmol/L (ref 134–144)
Total Protein: 7.6 g/dL (ref 6.0–8.5)
eGFR: 69 mL/min/{1.73_m2} (ref >59)

## 2022-08-04 LAB — CBC
Hematocrit: 40 % (ref 34.0–46.6)
Hemoglobin: 12.5 g/dL (ref 11.1–15.9)
MCH: 26.2 pg — ABNORMAL LOW (ref 26.6–33.0)
MCHC: 31.3 g/dL — ABNORMAL LOW (ref 31.5–35.7)
MCV: 84 fL (ref 79–97)
Platelets: 350 10*3/uL (ref 150–450)
RBC: 4.77 x10E6/uL (ref 3.77–5.28)
RDW: 12.4 % (ref 11.7–15.4)
WBC: 4.7 10*3/uL (ref 3.4–10.8)

## 2022-08-04 LAB — TSH RFX ON ABNORMAL TO FREE T4: TSH: 1.3 u[IU]/mL (ref 0.450–4.500)

## 2022-08-04 LAB — VITAMIN B12: Vitamin B-12: 380 pg/mL (ref 232–1245)

## 2022-08-04 LAB — INSULIN, RANDOM: INSULIN: 23.1 u[IU]/mL (ref 2.6–24.9)

## 2022-08-04 MED ORDER — SEMAGLUTIDE(0.25 OR 0.5MG/DOS) 2 MG/1.5ML ~~LOC~~ SOPN: 0.2500 mg | PEN_INJECTOR | SUBCUTANEOUS | 3 refills | Status: DC

## 2022-08-06 ENCOUNTER — Ambulatory Visit: Payer: 59 | Admitting: Family Medicine

## 2022-08-06 VITALS — BP 138/64 | Ht 64.0 in | Wt 236.0 lb

## 2022-08-06 DIAGNOSIS — M25561 Pain in right knee: Secondary | ICD-10-CM | POA: Diagnosis not present

## 2022-08-06 MED ORDER — MELOXICAM 15 MG PO TABS: 15.0000 mg | ORAL_TABLET | Freq: Every day | ORAL | 2 refills | Status: DC

## 2022-08-07 ENCOUNTER — Encounter: Payer: Self-pay | Admitting: Family Medicine

## 2022-08-07 ENCOUNTER — Ambulatory Visit: Payer: 59 | Admitting: Physical Therapy

## 2022-08-07 NOTE — Progress Notes (Signed)
PCP: Westley Chandler, MD  Subjective:   HPI: [name] here for right knee pain.  2/5: Patient seen sports medicine clinic on 01/28/2022 for right knee pain and diagnosed with likely meniscus tear with flap component, had positive Apley's and McMurray's test.  MRI showed root tear of posterior horn of medial meniscus, mild thinning of articular cartilage patellofemoral and medial compartments. She started PT, went for 5 sessions but stopped d/t change in insurance. Last went in December. She did not feel the PT was helping but is willing to try it again. Overall states she pain has improved a little over the past few months. She is still walking with a limp. Pain is worse with applying pressure especially going up and down steps. She is still taking meloxicam daily and has been taking tylenol for the past 2 days. She is doing home exercises and stretching at most a couple times a week.   4/8: Patient reports she's improving. Doing physical therapy and home exercises. Stairs are a problem still but otherwise feels she's on the right track. Needs refill on meloxicam. Walking for exercise.  Past Medical History:  Diagnosis Date   Daily headache    Fibromyalgia    G6PD deficiency    outside labs--low level, no longer on HCQ   Glucose 6 phosphatase deficiency    Hypertension    Prediabetes    Rheumatoid arthritis    SLE (systemic lupus erythematosus) 1998    Current Outpatient Medications on File Prior to Visit  Medication Sig Dispense Refill   aspirin EC 81 MG tablet Take 1 tablet (81 mg total) by mouth daily. Swallow whole. 30 tablet 11   atorvastatin (LIPITOR) 10 MG tablet Take 1 tablet (10 mg total) by mouth daily. 90 tablet 3   cetirizine (ZYRTEC) 10 MG tablet Take 1 tablet (10 mg total) by mouth daily. 30 tablet 11   FLUoxetine (PROZAC) 20 MG capsule Take 1 capsule (20 mg total) by mouth daily. 90 capsule 3   hydroxychloroquine (PLAQUENIL) 200 MG tablet Take 400  mg by mouth at bedtime.      indapamide (LOZOL) 2.5 MG tablet Take 1 tablet (2.5 mg total) by mouth daily. 90 tablet 2   losartan (COZAAR) 100 MG tablet Take 1 tablet (100 mg total) by mouth daily. 90 tablet 3   polyethylene glycol powder (GLYCOLAX/MIRALAX) 17 GM/SCOOP powder Take 17 g by mouth 2 (two) times daily as needed. 3350 g 1   potassium chloride SA (KLOR-CON M) 20 MEQ tablet Take 1 tablet (20 mEq total) by mouth daily. 90 tablet 3   Semaglutide,0.25 or 0.5MG /DOS, 2 MG/1.5ML SOPN Inject 0.25 mg into the skin once a week. 1.5 mL 3   spironolactone (ALDACTONE) 25 MG tablet Take 1 tablet (25 mg total) by mouth daily. 90 tablet 3   topiramate (TOPAMAX) 25 MG tablet Take 2 tablets at bedtime 60 tablet 2   No current facility-administered medications on file prior to visit.    Past Surgical History:  Procedure Laterality Date   ABDOMINAL HYSTERECTOMY  09/2008   CESAREAN SECTION  1992, 1995   CHOLECYSTECTOMY N/A 07/18/2012   Procedure: LAPAROSCOPIC CHOLECYSTECTOMY WITH INTRAOPERATIVE CHOLANGIOGRAM;  Surgeon: Atilano Ina, MD;  Location: [facility] OR;  Service: General;  Laterality: N/A;   ERCP N/A 07/29/2012   Procedure: ENDOSCOPIC RETROGRADE CHOLANGIOPANCREATOGRAPHY (ERCP);  Surgeon: Theda Belfast, MD;  Location: [facility];  Service: Endoscopy;  Laterality: N/A;  Dr. Elnoria Howard said he would start  this PT arond 1330( AW)   INCISION AND DRAINAGE Right 11/28/2015   "boil on upper arm; did this in dr's office"   INCISION AND DRAINAGE ABSCESS Right 12/02/2015   Procedure: INCISION AND DRAINAGE ABSCESS;  Surgeon: Jimmye Norman, MD;  Location: [facility] OR;  Service: General;  Laterality: Right;  Right axillary abscess   TUBAL LIGATION  1995    Allergies  Allergen Reactions   Amlodipine Swelling    Leg edema   Tessalon [Benzonatate] Other (See Comments)    Lip swelling    BP 138/64   Ht 5\' 4"  (1.626 m)   Wt 236 lb (107 kg)   BMI 40.51 kg/m       No data to display              No data to  display              Objective:  Physical Exam:  Gen: NAD, comfortable in exam room  Right knee: No gross deformity, ecchymoses, swelling. Mild TTP medial joint line. FROM with normal strength. Negative ant/post drawers. Negative valgus/varus testing. Negative lachman.  Negative mcmurrays, apleys.  NV intact distally.   Assessment & Plan:  1. Right knee pain - 2/2 medial meniscus tear with extrusion and underlying mild arthropathy.  Continue with physical therapy and transition to home exercise program.  Meloxicam as needed.  Call us if she isn't improving and would refer to ortho.  F/u prn.

## 2022-08-09 ENCOUNTER — Telehealth: Payer: Self-pay

## 2022-08-09 NOTE — Telephone Encounter (Signed)
A Prior Authorization was initiated for this patients OZEMPIC through CoverMyMeds.   Key: [id number]

## 2022-08-13 NOTE — Telephone Encounter (Signed)
Prior Auth for patients medication OZEMPIC denied by [facility] via CoverMyMeds.   Reason:    CoverMyMeds Key: [id number]

## 2022-09-14 ENCOUNTER — Telehealth: Payer: Self-pay | Admitting: *Deleted

## 2022-09-14 NOTE — Telephone Encounter (Signed)
Please call patient to schedule new patient appt, Thank you.

## 2022-09-17 NOTE — Telephone Encounter (Signed)
Patient is scheduled for 01/11/2023 with Dr. Corliss Skains.   LMOM to let patient know that we do have her on our cancellation list and will call when an earlier appointment becomes available.

## 2022-09-20 ENCOUNTER — Telehealth: Payer: Self-pay | Admitting: Gastroenterology

## 2022-09-20 NOTE — Telephone Encounter (Signed)
Ok please schedule next available non urgent visit with APP. Thanks

## 2022-09-20 NOTE — Telephone Encounter (Signed)
Good morning Dr. Lavon Paganini,   We received a referral for this patient for esophageal dysphagia. Patient was last treated by Dr. Elnoria Howard in 2022 for the same symptoms. She had an ERCP done by him in 05/2015. Since then patient has changed insurances and now Dr. Haywood Pao office is no longer in network. She would like to establish GI care with [facility]. Records were obtained and scanned into Media for your review. Would you please advise on scheduling?   Thank you.

## 2022-10-11 NOTE — Progress Notes (Signed)
    SUBJECTIVE:   CHIEF COMPLAINT: check labs HPI:   Veronica [name] is a 62 y.o.  with history notable for HTN presenting for check up. She reports she stopped her Kcl pills.  She continues to have intermittent right sided tingling sensation. No numbness, weakness, speech changes. Has Neurology follow up scheduled. This has been ongoing for several months.   She reports several stressors. Son was involved in DWI in a car under her name. Husband has been upset   PERTINENT  PMH / PSH/Family/Social History :   HTN SLE -well controlled on Plaquenil  Diabetes   OBJECTIVE:   BP 130/72   Pulse 73   Ht 5\' 4"  (1.626 m)   Wt 236 lb 6.4 oz (107.2 kg)   SpO2 99%   BMI 40.58 kg/m   Today's weight:  Last Weight  Most recent update: 10/12/2022 10:20 AM    Weight  107.2 kg (236 lb 6.4 oz)            Review of prior weights: Filed Weights   10/12/22 1020  Weight: 236 lb 6.4 oz (107.2 kg)     Cardiac: Regular rate and rhythm. Normal S1/S2. No murmurs, rubs, or gallops appreciated. Lungs: Clear bilaterally to ascultation.  Abdomen: Normoactive bowel sounds. No tenderness to deep or light palpation. No rebound or guarding.   Psych: Pleasant and appropriate   Neuro: CN II: PERRL CN III, IV,VI: EOMI Normal speech  CN XII: Symmetric tongue protrusion  UE and LE strength 5/5 2+ UE and LE reflexes  Normal sensation in UE and LE bilaterally   ASSESSMENT/PLAN:   SLE (systemic lupus erythematosus) (HCC) Scheduled with [facility] She will schedule eye appointment  Refilled Plaquenil until visit  April CBC and CMP appropriate   Essential hypertension, benign At goal, discontinue Kcl Labs today   Mood disorder (HCC) Discussed--supportive listening provided, referral to connect with therapy Information about psychology today given   Prediabetes Interested in more weight loss options, number provided to [employer] [facility]        [name], MD   Family Medicine Teaching Service  [facility]

## 2022-10-12 ENCOUNTER — Ambulatory Visit (INDEPENDENT_AMBULATORY_CARE_PROVIDER_SITE_OTHER): Payer: 59 | Admitting: Family Medicine

## 2022-10-12 ENCOUNTER — Encounter: Payer: Self-pay | Admitting: Family Medicine

## 2022-10-12 ENCOUNTER — Other Ambulatory Visit: Payer: Self-pay

## 2022-10-12 VITALS — BP 130/72 | HR 73 | Ht 64.0 in | Wt 236.4 lb

## 2022-10-12 DIAGNOSIS — F39 Unspecified mood [affective] disorder: Secondary | ICD-10-CM

## 2022-10-12 DIAGNOSIS — R7303 Prediabetes: Secondary | ICD-10-CM | POA: Diagnosis not present

## 2022-10-12 DIAGNOSIS — I10 Essential (primary) hypertension: Secondary | ICD-10-CM | POA: Diagnosis not present

## 2022-10-12 DIAGNOSIS — M329 Systemic lupus erythematosus, unspecified: Secondary | ICD-10-CM

## 2022-10-12 MED ORDER — HYDROXYCHLOROQUINE SULFATE 200 MG PO TABS: 400.0000 mg | ORAL_TABLET | Freq: Every day | ORAL | 0 refills | Status: DC

## 2022-10-12 NOTE — Patient Instructions (Addendum)
It was wonderful to see you today.  Please bring ALL of your medications with you to every visit.   Today we talked about:  -Getting blood work   -Scientist, research (physical sciences) [employer] [facility] Phone: [phone]  Call your eye doctor for a visit   I will message you with labs  A care navigator will call about therapy You can also check out [email or web address] This has a list of therapists covered by your health plan  If your tingling sensations change or worsen I highly recommend calling your Neurologist    Please follow up in 3 months   Thank you for choosing [facility].   Please call [phone] with any questions about today's appointment.  Please be sure to schedule follow up at the front  desk before you leave today.   [name], MD  Family Medicine

## 2022-10-12 NOTE — Assessment & Plan Note (Signed)
Discussed--supportive listening provided, referral to connect with therapy Information about psychology today given

## 2022-10-12 NOTE — Assessment & Plan Note (Addendum)
Scheduled with [facility] She will schedule eye appointment  Refilled Plaquenil until visit  April CBC and CMP appropriate

## 2022-10-12 NOTE — Assessment & Plan Note (Signed)
Interested in more weight loss options, number provided to [employer] [facility]

## 2022-10-12 NOTE — Assessment & Plan Note (Signed)
At goal, discontinue Kcl Labs today

## 2022-10-13 LAB — BASIC METABOLIC PANEL
BUN/Creatinine Ratio: 15 (ref 9–23)
BUN: 15 mg/dL (ref 6–24)
CO2: 24 mmol/L (ref 20–29)
Calcium: 9.7 mg/dL (ref 8.7–10.2)
Chloride: 102 mmol/L (ref 96–106)
Creatinine, Ser: 0.97 mg/dL (ref 0.57–1.00)
Glucose: 100 mg/dL — ABNORMAL HIGH (ref 70–99)
Potassium: 3.6 mmol/L (ref 3.5–5.2)
Sodium: 139 mmol/L (ref 134–144)
eGFR: 67 mL/min/{1.73_m2} (ref >59)

## 2022-10-23 ENCOUNTER — Other Ambulatory Visit: Payer: 59 | Admitting: Licensed Clinical Social Worker

## 2022-10-23 NOTE — Patient Outreach (Signed)
Medicaid Managed Care Social Work Note  10/23/2022 Name:  Veronica [name] MRN:  [id number] DOB:  1960-12-08  Veronica [name] is an 62 y.o. year old female who is a primary patient of Westley Chandler, MD.  The Medicaid Managed Care Coordination team was consulted for assistance with:  Mental Health Counseling and Resources  [name] was given information about Medicaid Managed Care Coordination team services today. Veronica [name] Patient agreed to services and verbal consent obtained.  Engaged with patient  for by telephone forinitial visit in response to referral for case management and/or care coordination services.   Assessments/Interventions:  Review of past medical history, allergies, medications, health status, including review of consultants reports, laboratory and other test data, was performed as part of comprehensive evaluation and provision of chronic care management services.  SDOH: (Social Determinant of Health) assessments and interventions performed: SDOH Interventions    Flowsheet Row Patient Outreach Telephone from 10/23/2022 in [facility] Office Visit from 01/16/2022 in [facility] Office Visit from 09/01/2021 in [facility] Office Visit from 04/07/2021 in [facility] Chronic Care Management from 02/02/2021 in [facility] Office Visit from 01/06/2021 in [facility]  SDOH Interventions        Housing Interventions Intervention Not Indicated -- -- -- -- --  Depression Interventions/Treatment  -- Counseling Currently on Treatment, Counseling Currently on Treatment -- Counseling  Stress Interventions -- -- -- -- Other (Comment), Provide Counseling  [referral] --       Advanced Directives Status:  See Care Plan for related entries.  Care Plan                 Allergies  Allergen Reactions   Amlodipine Swelling    Leg edema   Tessalon  [Benzonatate] Other (See Comments)    Lip swelling    Medications Reviewed Today     Reviewed by Westley Chandler, MD (Physician) on 10/12/22 at 1023  Med List Status: <None>   Medication Order Taking? Sig Documenting Provider Last Dose Status Informant  aspirin EC 81 MG tablet [id number] Yes Take 1 tablet (81 mg total) by mouth daily. Swallow whole. Autry-Lott, Randa Evens, DO Taking Active   atorvastatin (LIPITOR) 10 MG tablet [id number] Yes Take 1 tablet (10 mg total) by mouth daily. Westley Chandler, MD Taking Active   cetirizine (ZYRTEC) 10 MG tablet [id number]  Take 1 tablet (10 mg total) by mouth daily. Westley Chandler, MD  Active   FLUoxetine (PROZAC) 20 MG capsule [id number] Yes Take 1 capsule (20 mg total) by mouth daily. Westley Chandler, MD Taking Active   hydroxychloroquine (PLAQUENIL) 200 MG tablet [id number] Yes Take 400 mg by mouth at bedtime.  [provider] Taking Active Multiple Informants  indapamide (LOZOL) 2.5 MG tablet [id number] Yes Take 1 tablet (2.5 mg total) by mouth daily. Westley Chandler, MD Taking Active   losartan (COZAAR) 100 MG tablet [id number] Yes Take 1 tablet (100 mg total) by mouth daily. Westley Chandler, MD Taking Active   meloxicam [facility]) 15 MG tablet [id number]  Take 1 tablet (15 mg total) by mouth daily. Lenda Kelp, MD  Active   polyethylene glycol powder (GLYCOLAX/MIRALAX) 17 GM/SCOOP powder [id number]  Take 17 g by mouth 2 (two) times daily as needed. Westley Chandler, MD  Active   potassium chloride SA (KLOR-CON M) 20 MEQ tablet [id number] No Take  1 tablet (20 mEq total) by mouth daily.  Patient not taking: Reported on 10/12/2022   Westley Chandler, MD Not Taking Active   Semaglutide,0.25 or 0.5MG /DOS, 2 MG/1.5ML SOPN [id number] No Inject 0.25 mg into the skin once a week.  Patient not taking: Reported on 10/12/2022   Westley Chandler, MD Not Taking Active   spironolactone (ALDACTONE) 25 MG tablet [id number] Yes Take 1 tablet (25 mg total) by mouth daily.  Westley Chandler, MD Taking Active   topiramate (TOPAMAX) 25 MG tablet [id number] Yes Take 2 tablets at bedtime Westley Chandler, MD Taking Active   Med List Note Glori Luis, MD 07/16/14 [phone]):               Patient Active Problem List   Diagnosis Date Noted   Esophageal dysphagia 04/15/2020   Mood disorder (HCC) 11/13/2019   Atherosclerosis of aorta (HCC) 07/15/2019   Prediabetes 07/13/2019   Chronic abdominal pain 07/13/2019   Insomnia 12/15/2012   Essential hypertension, benign 04/10/2012   SLE (systemic lupus erythematosus) (HCC) 04/10/2012    Conditions to be addressed/monitored per PCP order:  Mood Disorder  Care Plan : General Social Work (Adult)  Updates made by Gustavus Bryant, LCSW since 10/23/2022 12:00 AM     Problem: Emotional Distress      Goal: Emotional Health Supported by connecting for ongoing Therapy   Start Date: 02/02/2021  Expected End Date: 04/29/2021  Recent Progress: On track  Priority: High  Note:   Priority: High  Timeframe:  Short-Range Goal Priority:  High Start Date:   10/23/22           Expected End Date:  ongoing                     Follow Up Date--11/06/22 at 9 am  Current Barriers:  Patient has not been able to connect to counseling/resources provided Disease Management support and education needs related to Mood Instability  CSW Clinical Goal(s):  Patient  will contact providers discussed today and will make decision on where she wishes to gain therapy  Interventions: Inter-disciplinary care team collaboration (see longitudinal plan of care) Evaluation of current treatment plan related to  self management and patient's adherence to plan as established by provider Establishing healthy boundaries emphasized and healthy self-care education provided Patient was educated on available mental health resources within their area that accept Medicaid and offer counseling and psychiatry.  Patient educated on the difference between  therapy and psychiatry per patient request Email sent to patient today with available mental health resources within her area that accept Medicaid and offer the services that she is interested in. Email included instructions for scheduling at [facility] as well as some crisis support resources and [facility]'s walk in clinic hours. Patient will review resources and make a decision regarding where she wishes to gain MH treatment at. Patient will work on implementing appropriate self-care habits into their daily routine such as: staying positive, writing a gratitude list, drinking water, staying active around the house, taking their medications as prescribed, combating negative thoughts or emotions and staying connected with their family and friends. Positive reinforcement provided for this decision to work on this. Motivational Interviewing employed Depression screen reviewed  PHQ2/ PHQ9 completed or reviewed  Mindfulness or Relaxation training provided Active listening / Reflection utilized  Advance Care and HCPOA education provided Emotional Support Provided Problem Solving /Task Center strategies reviewed Provided psychoeducation for mental health needs  Provided brief  CBT  Reviewed mental health medications and discussed importance of compliance:  Quality of sleep assessed & Sleep Hygiene techniques promoted  Participation in counseling encouraged  Verbalization of feelings encouraged  Suicidal Ideation/Homicidal Ideation assessed: Patient denies SI/HI  Review resources, discussed options and provided patient information about  Mental Health Resources Inter-disciplinary care team collaboration (see longitudinal plan of care) Patient Goals/Self-Care Activities: Over the next 90 days Attend scheduled medical appointments Utilize healthy coping skills and supportive resources discussed Contact PCP with any questions or concerns Keep 90 percent of counseling appointments Call your insurance provider  for more information about your Enhanced Benefits  Check out counseling resources provided  Begin personal counseling with LCSW, to reduce and manage symptoms of Depression and Stress, until well-established with mental health provider Incorporate into daily practice - relaxation techniques, deep breathing exercises, and mindfulness meditation strategies. Talk about feelings with friends, family members, spiritual advisor, etc. Contact LCSW directly [phone]), if you have questions, need assistance, or if additional social work needs are identified between now and our next scheduled telephone outreach call. Call 988 for mental health hotline/crisis line if needed (24/7 available) Try techniques to reduce symptoms of anxiety/negative thinking (deep breathing, distraction, positive self talk, etc)  - develop a personal safety plan - develop a plan to deal with triggers like holidays, anniversaries - exercise at least 2 to 3 times per week - have a plan for how to handle bad days - journal feelings and what helps to feel better or worse - spend time or talk with others at least 2 to 3 times per week - watch for early signs of feeling worse - begin personal counseling - call and visit an old friend - check out volunteer opportunities - join a support group - laugh; watch a funny movie or comedian - learn and use visualization or guided imagery - perform a random act of kindness - practice relaxation or meditation daily - start or continue a personal journal - practice positive thinking and self-talk -continue with compliance of taking medication  -identify current effective and ineffective coping strategies.  -implement positive self-talk in care to increase self-esteem, confidence and feelings of control.  -consider alternative and complementary therapy approaches such as meditation, mindfulness or yoga.  -journaling, prayer, worship services, meditation or pastoral counseling.   -increase participation in pleasurable group activities such as hobbies, singing, sports or volunteering).  -consider the use of meditative movement therapy such as tai chi, yoga or qigong.  -start a regular daily exercise program based on tolerance, ability and patient choice to support positive thinking and activity    If you are experiencing a Mental Health or Behavioral Health Crisis or need someone to talk to, please call the Suicide and Crisis Lifeline: 988    Patient Goals: Initial goal         24- Hour Availability:    [facility]  [address] [place], [place] [phone] Crisis [phone]   [facility] [phone]  [facility]  [phone]    [facility]  [phone] (after hours)   Therapeutic Alternative/Mobile Crisis   [phone]   [place] National Suicide Hotline  209-410-4190 Len Child[place]) [place] 062   Call 541-706-3091 for mental health emergencies   [facility]  [phone]);  [place] and [employer]  [phone]); [place], [place], [place], [place], [place], [place], [place]    [facility] for  [place] [employer] For 24/7 walk-up access to mental health services for [facility] children (4+), adolescents and adults, please visit the [facility] located at [address] in [place], [place].  *[facility] also provides comprehensive outpatient behavioral health services in a variety of locations around the [place].  Connect With [place] [address] [place], [place] [address] HelpLine: 3062754087 or 1-563-183-5888  Get Directions  Find Help 24/7 By Phone Call our 24-hour HelpLine at 8287924025 or [phone] for immediate assistance for mental health and substance abuse issues.  Walk-In Help [place]: [facility] (Ages 4 and  Up) [place]: Emergency Dept., [facility] Additional Resources National Hopeline Network: 1-800-SUICIDE The National Suicide Prevention Lifeline: 1-800-273-TALK     10 LITTLE Things To Do When You're Feeling Too Down To Do Anything  Take a shower. Even if you plan to stay in all day long and not see a soul, take a shower. It takes the most effort to hop in to the shower but once you do, you'll feel immediate results. It will wake you up and you'll be feeling much fresher (and cleaner too).  Brush and floss your teeth. Give your teeth a good brushing with a floss finish. It's a small task but it feels so good and you can check 'taking care of your health' off the list of things to do.  Do something small on your list. Most of [place] have some small thing we would like to get done (load of laundry, sew a button, email a friend). Doing one of these things will make you feel like you've accomplished something.  Drink water. Drinking water is easy right? It's also really beneficial for your health so keep a glass beside you all day and take sips often. It gives you energy and prevents you from boredom eating.  Do some floor exercises. The last thing you want to do is exercise but it might be just the thing you need the most. Keep it simple and do exercises that involve sitting or laying on the floor. Even the smallest of exercises release chemicals in the brain that make you feel good. Yoga stretches or core exercises are going to make you feel good with minimal effort.  Make your bed. Making your bed takes a few minutes but it's productive and you'll feel relieved when it's done. An unmade bed is a huge visual reminder that you're having an unproductive day. Do it and consider it your housework for the day.  Put on some nice clothes. Take the sweatpants off even if you don't plan to go anywhere. Put on clothes that make you feel good. Take a look in the mirror so your  brain recognizes the sweatpants have been replaced with clothes that make you look great. It's an instant confidence booster.  Wash the dishes. A pile of dirty dishes in the sink is a reflection of your mood. It's possible that if you wash up the dishes, your mood will follow suit. It's worth a try.  Cook a real meal. If you have the luxury to have a "do nothing" day, you have time to make a real meal for yourself. Make a meal that you love to eat. The process is good to get you out of the funk and the food will ensure you have more energy for tomorrow.  Write out your thoughts by hand. When you hand write, you stimulate your brain to focus on  the moment that you're in so make yourself comfortable and write whatever comes into your mind. Put those thoughts out on paper so they stop spinning around in your head. Those thoughts might be the very thing holding you down.     10/12/2022   10:21 AM 08/03/2022   10:19 AM 01/16/2022   10:31 AM 10/20/2021   10:34 AM 09/01/2021   10:20 AM  Depression screen PHQ 2/9  Decreased Interest 2 1 2 1 2   Down, Depressed, Hopeless 2 1 2 1 2   PHQ - 2 Score 4 2 4 2 4   Altered sleeping 2 1 0 1 2  Tired, decreased energy 2 1 2 2 2   Change in appetite 2 2 2 2 2   Feeling bad or failure about yourself  0 0 0 0 1  Trouble concentrating 2 1 2 1 2   Moving slowly or fidgety/restless 0 0 2 1 2   Suicidal thoughts 0 0 0 0 0  PHQ-9 Score 12 7 12 9 15   Difficult doing work/chores    Not difficult at all    Follow up:  Patient agrees to Care Plan and Follow-up.  Plan: The Managed Medicaid care management team will reach out to the patient again over the next 30 days.  Dickie La, BSW, MSW, [employer] Managed Medicaid LCSW [facility]  [facility] [email or web address] Phone: [phone]

## 2022-10-23 NOTE — Patient Instructions (Addendum)
Visit Information  Veronica [name] was given information about Medicaid Managed Care team care coordination services as a part of their [employer] Medicaid benefit. [name] verbally consentedto engagement with the [facility] Managed Care team.   If you are experiencing a medical emergency, please call 911 or report to your local emergency department or urgent care.   If you have a non-emergency medical problem during routine business hours, please contact your provider's office and ask to speak with a nurse.   For questions related to your [facility] health plan, please call: [phone]  OR visit the member homepage at: [email or web address]  If you would like to schedule transportation through your [facility] plan, please call the following number at least 2 days in advance of your appointment: [phone]  If you are experiencing a behavioral health crisis, call the [facility] Crisis Line at [phone]). The line is available 24 hours a day, seven days a week.  If you would like help to quit smoking, call 1-800-QUIT-NOW (701-850-7924) OR Espaol: 1-855-Djelo-Ya (6-283-151-7616) o para ms informacin haga clic aqu or Text READY to 073-710 to register via text   Following is a copy of your plan of care:  Care Plan : General Social Work (Adult)  Updates made by Gustavus Bryant, LCSW since 10/23/2022 12:00 AM     Problem: Emotional Distress      Goal: Emotional Health Supported by connecting for ongoing Therapy   Start Date: 02/02/2021  Expected End Date: 04/29/2021  Recent Progress: On track  Priority: High  Note:   Priority: High  Timeframe:  Short-Range Goal Priority:  High Start Date:   10/23/22           Expected End Date:  ongoing                     Follow Up Date--11/06/22 at 9 am  Current Barriers:  Patient has not  been able to connect to counseling/resources provided Disease Management support and education needs related to Mood Instability  CSW Clinical Goal(s):  Patient  will contact providers discussed today and will make decision on where she wishes to gain therapy  Patient Goals/Self-Care Activities: Over the next 90 days Attend scheduled medical appointments Utilize healthy coping skills and supportive resources discussed Contact PCP with any questions or concerns Keep 90 percent of counseling appointments Call your insurance provider for more information about your Enhanced Benefits  Check out counseling resources provided  Begin personal counseling with LCSW, to reduce and manage symptoms of Depression and Stress, until well-established with mental health provider Incorporate into daily practice - relaxation techniques, deep breathing exercises, and mindfulness meditation strategies. Talk about feelings with friends, family members, spiritual advisor, etc. Contact LCSW directly [phone]), if you have questions, need assistance, or if additional social work needs are identified between now and our next scheduled telephone outreach call. Call 988 for mental health hotline/crisis line if needed (24/7 available) Try techniques to reduce symptoms of anxiety/negative thinking (deep breathing, distraction, positive self talk, etc)  - develop a personal safety plan - develop a plan to deal with triggers like holidays, anniversaries - exercise at least 2 to 3 times per week - have a plan for how to handle bad days - journal feelings and what helps to feel better or worse - spend time or talk with others at least 2 to 3 times per week - watch for early signs of  feeling worse - begin personal counseling - call and visit an old friend - check out volunteer opportunities - join a support group - laugh; watch a funny movie or comedian - learn and use visualization or guided imagery - perform a  random act of kindness - practice relaxation or meditation daily - start or continue a personal journal - practice positive thinking and self-talk -continue with compliance of taking medication  -identify current effective and ineffective coping strategies.  -implement positive self-talk in care to increase self-esteem, confidence and feelings of control.  -consider alternative and complementary therapy approaches such as meditation, mindfulness or yoga.  -journaling, prayer, worship services, meditation or pastoral counseling.  -increase participation in pleasurable group activities such as hobbies, singing, sports or volunteering).  -consider the use of meditative movement therapy such as tai chi, yoga or qigong.  -start a regular daily exercise program based on tolerance, ability and patient choice to support positive thinking and activity    If you are experiencing a Mental Health or Behavioral Health Crisis or need someone to talk to, please call the Suicide and Crisis Lifeline: 988    Patient Goals: Initial goal     24- Hour Availability:    [facility]  [address] [place], [place] [phone] Crisis [phone]   [facility] [phone]  [place] [facility]  [phone]    [facility]  [phone] (after hours)   Therapeutic Alternative/Mobile Crisis   [phone]   [place] National Suicide Hotline  6700074991 Len Childs) [place] [address]   Call (412)086-8913 for mental health emergencies   [facility]  [phone]);  [place] and [employer]  [phone]); [place], [place], [place], [place], [place], [place], [place]    [facility] for [facility] Residents For 24/7 walk-up access to mental health services for [facility] children (4+), adolescents and adults, please visit the [facility] located at  [address] in [place], [place].  *[facility] also provides comprehensive outpatient behavioral health services in a variety of locations around the [place].  Connect With [place] [address] [place], [place] [address] HelpLine: (418)836-3564 or 1-281-055-2028  Get Directions  Find Help 24/7 By Phone Call our 24-hour HelpLine at (647) 110-0223 or (671)873-4575 for immediate assistance for mental health and substance abuse issues.  Walk-In Help [place]: [facility] (Ages 4 and Up) [place]: Emergency Dept., [facility] Additional Resources National Hopeline Network: 1-800-SUICIDE The National Suicide Prevention Lifeline: 1-800-273-TALK     10 LITTLE Things To Do When You're Feeling Too Down To Do Anything  Take a shower. Even if you plan to stay in all day long and not see a soul, take a shower. It takes the most effort to hop in to the shower but once you do, you'll feel immediate results. It will wake you up and you'll be feeling much fresher (and cleaner too).  Brush and floss your teeth. Give your teeth a good brushing with a floss finish. It's a small task but it feels so good and you can check 'taking care of your health' off the list of things to do.  Do something small on your list. Most of [place] have some small thing we would like to get done (load of laundry, sew a button, email a friend). Doing one of these things will make you feel like you've accomplished something.  Drink water. Drinking water is easy right? It's also really beneficial for your health so keep a glass beside you all day and take sips often. It gives you energy and prevents you from boredom eating.  Do some floor exercises. The last thing you want to do is exercise but it might be just the thing you need the most. Keep it simple and do exercises that involve sitting or laying on the floor. Even the smallest of exercises release chemicals in  the brain that make you feel good. Yoga stretches or core exercises are going to make you feel good with minimal effort.  Make your bed. Making your bed takes a few minutes but it's productive and you'll feel relieved when it's done. An unmade bed is a huge visual reminder that you're having an unproductive day. Do it and consider it your housework for the day.  Put on some nice clothes. Take the sweatpants off even if you don't plan to go anywhere. Put on clothes that make you feel good. Take a look in the mirror so your brain recognizes the sweatpants have been replaced with clothes that make you look great. It's an instant confidence booster.  Wash the dishes. A pile of dirty dishes in the sink is a reflection of your mood. It's possible that if you wash up the dishes, your mood will follow suit. It's worth a try.  Cook a real meal. If you have the luxury to have a "do nothing" day, you have time to make a real meal for yourself. Make a meal that you love to eat. The process is good to get you out of the funk and the food will ensure you have more energy for tomorrow.  Write out your thoughts by hand. When you hand write, you stimulate your brain to focus on the moment that you're in so make yourself comfortable and write whatever comes into your mind. Put those thoughts out on paper so they stop spinning around in your head. Those thoughts might be the very thing holding you down.  Dickie La, BSW, MSW, [employer] Managed Medicaid LCSW [facility]  [facility] [email or web address] Phone: [phone]

## 2022-10-25 NOTE — Progress Notes (Signed)
NEUROLOGY FOLLOW UP OFFICE NOTE  PAXTEN POGOSYAN [id number]  Assessment/Plan:   Tension-type headache, not intractable  Hypertension History of TIA Paroxysmal extremity pain.  Unclear etiology.  Not consistent with a polyneuropathy, radiculopathy or myelopathy, or intracranial abnormality.  Not a typical side effect of topiramate.     1.Topiramate 50mg  at bedtime  2. Secondary stroke prevention as managed by PCP: - ASA 81mg  daily - Statin.  LDL goal less than 70 - Normotensive blood pressure - Hgb A1c goal less than 7 4. Monitor the paroxysmal pain.  If gets worse, contact me 5.  Otherwise, Follow up one year   Subjective:  Karoline Nosek is a 62 year old right-handed woman with SLE, hypertension, and allergic rhinitis who follows up for TIA and headache.   UPDATE: Current medications for secondary stroke prevention:  ASA 81mg , atorvastatin 10mg , losartan  Started experiencing intermittent "electric shock" sensation in the right shoulder, arm, leg or feet.  It is non-radiating.  It lasts for a second or two.  Not with any particular activity or position.  No neck pain.  No associated weakness or numbness.  Does not involve the left side.    Recent labs unremarkable, including B12 380, TSH 1.300, CBC and CMP.      Current NSAIDS:  Naproxen 500mg   Current analgesics:  Tylenol Current triptans:  none Current ergotamine:  none Current anti-emetic:  none Current muscle relaxants:  Flexeril  Current anti-anxiolytic:  none Current sleep aide:  none Current Antihypertensive medications:  Losartan, indapamide, spironolactone Current Antidepressant medications:  fluoxetine Current Anticonvulsant medications:  topiramate 50mg  at bedtime Current anti-CGRP:  none Current Vitamins/Herbal/Supplements:  none Current Antihistamines/Decongestants:  Zyrtec Other therapy:  Vicks vapor rub to forehead Other medication:  Plaquenil   Caffeine:  No coffee.  Sometimes Coke Diet:  Drinks  3 bottles of 16.9 oz water daily.  1 can of Fanta Orange or [employer] Exercise:  no Depression:  no; Anxiety:  Increased work-related anxiety Other pain:  no Sleep hygiene:  Poor.  Diagnosed with mild OSA in 2019. Instructed to lose weight and follow up.  She reports having lost weight but sleep has not improved.     HISTORY:  She was seen in [facility] ED at [facility] on 12/05/2019 for left sided facial droop and slurred speech.  She was on the phone when she started feeling lightheaded and noted slurred speech.  She called 911 and when EMS arrived, they noticed subtle left sided facial droop.  She had an associated severe pounding frontal headache.  No unilateral numbness or weakness of extremities. She presented as a code stroke.  Blood pressure initially 130 systolic but later 170.  No objective neurologic signs on exam in the ED (NIHSS 0).  CT and MRI of brain personally reviewed were unremarkable.  tPA was not administered due to resolution of symptoms.  She was discharged on ASA 81mg  daily.  Since then, she has had a daily headache.  She also has feeling of heat in her body and has increased nervousness and shakiness.  She feels off balance.  When she closes her eyes, she sees darkness that is moving quickly across her visual field.  Due to increased anxiety, she was started on Prozac.  Lipitor was increased from 10mg  to 40mg  daily.  CTA head and neck on 01/08/2020 showed no large vessel stenosis or occlusion.  In December 2021, she has had episodes of "black outs" in which she is standing and suddenly feels off  balance, possibly loss of awareness for a second or so.  She also has been stuttering or have slurred speech.  Orthostatic vitals reportedly normal.  She was told that it could have been dehydration so she started increasing water intake and it has resolved.  05/24/2020 2D echocardiogram:  EF 60-65% with no cardiac source of emboli.  12 day Cardiac event monitor:  No afib or significant heart  block  In 2023, she reported a mild left temporal throbbing headache.  Usually lasts a minute or two.  Occurs maybe once a week.  No associated nausea, vomiting, photophobia, phonophobia, osmophobia, visual disturbance, autonomic symptoms or numbness/weakness.    She has history of headaches since 2012-2013, described as moderate bifrontal non-throbbing headache with slight dizziness.   She has a known partially empty sella on MRI of brain with and without contrast from 04/08/15 and MRI of brain without contrast on 07/12/11.  She has her eyes examined annually.  No significant issues.     Labs from July:  Hgb A1c 5.5; LDL 60 Labs from August:  SARS Coronvirus 2 negative; TSH 1.150   Past NSAIDS:  ibuprofen Past analgesics:  Excedrin Past abortive triptans:  none Past abortive ergotamine:  none Past muscle relaxants:  none Past anti-emetic:  none Past antihypertensive medications:  none Past antidepressant medications:  none Past anticonvulsant medications:  none Past anti-CGRP:  none Past vitamins/Herbal/Supplements:  none Past antihistamines/decongestants:  none Other past therapies:  none     Sister has MS.  PAST MEDICAL HISTORY: Past Medical History:  Diagnosis Date   Daily headache    Fibromyalgia    G6PD deficiency    outside labs--low level, no longer on HCQ   Glucose 6 phosphatase deficiency (HCC)    Hypertension    Prediabetes    Rheumatoid arthritis (HCC)    SLE (systemic lupus erythematosus) (HCC) 1998    MEDICATIONS: Current Outpatient Medications on File Prior to Visit  Medication Sig Dispense Refill   aspirin EC 81 MG tablet Take 1 tablet (81 mg total) by mouth daily. Swallow whole. 30 tablet 11   atorvastatin (LIPITOR) 10 MG tablet Take 1 tablet (10 mg total) by mouth daily. 90 tablet 3   cetirizine (ZYRTEC) 10 MG tablet Take 1 tablet (10 mg total) by mouth daily. 30 tablet 11   FLUoxetine (PROZAC) 20 MG capsule Take 1 capsule (20 mg total) by mouth daily.  90 capsule 3   hydroxychloroquine (PLAQUENIL) 200 MG tablet Take 2 tablets (400 mg total) by mouth at bedtime. 180 tablet 0   indapamide (LOZOL) 2.5 MG tablet Take 1 tablet (2.5 mg total) by mouth daily. 90 tablet 2   losartan (COZAAR) 100 MG tablet Take 1 tablet (100 mg total) by mouth daily. 90 tablet 3   meloxicam (MOBIC) 15 MG tablet Take 1 tablet (15 mg total) by mouth daily. 30 tablet 2   polyethylene glycol powder (GLYCOLAX/MIRALAX) 17 GM/SCOOP powder Take 17 g by mouth 2 (two) times daily as needed. 3350 g 1   potassium chloride SA (KLOR-CON M) 20 MEQ tablet Take 1 tablet (20 mEq total) by mouth daily. (Patient not taking: Reported on 10/12/2022) 90 tablet 3   Semaglutide,0.25 or 0.5MG /DOS, 2 MG/1.5ML SOPN Inject 0.25 mg into the skin once a week. (Patient not taking: Reported on 10/12/2022) 1.5 mL 3   spironolactone (ALDACTONE) 25 MG tablet Take 1 tablet (25 mg total) by mouth daily. 90 tablet 3   topiramate (TOPAMAX) 25 MG tablet Take 2 tablets  at bedtime 60 tablet 2   No current facility-administered medications on file prior to visit.    ALLERGIES: Allergies  Allergen Reactions   Amlodipine Swelling    Leg edema   Tessalon [Benzonatate] Other (See Comments)    Lip swelling    FAMILY HISTORY: Family History  Problem Relation Age of Onset   Diabetes Mother    Bladder Cancer Mother    Hypertension Father    Kidney disease Father    Multiple sclerosis Sister    Hypertension Brother       Objective:  Blood pressure 110/80, height 5\' 4"  (1.626 m), weight 234 lb (106.1 kg). General: No acute distress.  Patient appears well-groomed.   Head:  Normocephalic/atraumatic Eyes:  Fundi examined but not visualized Neck: supple, no paraspinal tenderness, full range of motion Heart:  Regular rate and rhythm Neurological Exam: alert and oriented.  Speech fluent and not dysarthric, language intact.  CN II-XII intact. Bulk and tone normal, muscle strength 5/5 throughout.  Reports  decreased pinprick sensation over the dorsum of her left wrist.  Otherwise, sensation to pinprick and vibration intact.  Deep tendon reflexes 2+ throughout.  Finger to nose testing intact.  Gait normal, Romberg negative.     Shon Millet, DO  CC: Imagene Riches, MD

## 2022-10-29 ENCOUNTER — Ambulatory Visit: Payer: 59 | Admitting: Neurology

## 2022-10-29 ENCOUNTER — Encounter: Payer: Self-pay | Admitting: Neurology

## 2022-10-29 VITALS — BP 110/80 | Ht 64.0 in | Wt 234.0 lb

## 2022-10-29 DIAGNOSIS — G44219 Episodic tension-type headache, not intractable: Secondary | ICD-10-CM

## 2022-10-29 DIAGNOSIS — M792 Neuralgia and neuritis, unspecified: Secondary | ICD-10-CM | POA: Diagnosis not present

## 2022-10-29 MED ORDER — TOPIRAMATE 50 MG PO TABS
50.0000 mg | ORAL_TABLET | Freq: Every day | ORAL | 3 refills | Status: DC
Start: 2022-10-29 — End: 2023-01-15

## 2022-10-29 NOTE — Patient Instructions (Signed)
Continue topiramate 50mg  at bedtime  Monitor the shocks.  If they get worse, let me know

## 2022-11-06 ENCOUNTER — Other Ambulatory Visit: Payer: 59 | Admitting: Licensed Clinical Social Worker

## 2022-11-06 NOTE — Patient Outreach (Signed)
Medicaid Managed Care Social Work Note  11/06/2022 Name:  Veronica [name] MRN:  [id number] DOB:  08/14/1960  Veronica [name] is an 62 y.o. year old female who is a primary patient of Westley Chandler, MD.  The Medicaid Managed Care Coordination team was consulted for assistance with:  Mental Health Counseling and Resources  Ms. Piro was given information about Medicaid Managed Care Coordination team services today. Veronica [name] Patient agreed to services and verbal consent obtained.  Engaged with patient  for by telephone forfollow up visit in response to referral for case management and/or care coordination services.   Assessments/Interventions:  Review of past medical history, allergies, medications, health status, including review of consultants reports, laboratory and other test data, was performed as part of comprehensive evaluation and provision of chronic care management services.  SDOH: (Social Determinant of Health) assessments and interventions performed: SDOH Interventions    Flowsheet Row Patient Outreach Telephone from 11/06/2022 in [facility] Patient Outreach Telephone from 10/23/2022 in [facility] Office Visit from 01/16/2022 in [facility] Office Visit from 09/01/2021 in [facility] Office Visit from 04/07/2021 in [facility] Chronic Care Management from 02/02/2021 in [facility]  SDOH Interventions        Housing Interventions -- Intervention Not Indicated -- -- -- --  Depression Interventions/Treatment  -- -- Counseling Currently on Treatment, Counseling Currently on Treatment --  Stress Interventions Offered [employer], Provide Counseling -- -- -- -- Other (Comment), Provide Counseling  [referral]       Advanced Directives Status:  Not addressed in this encounter.  Care Plan                 Allergies   Allergen Reactions   Amlodipine Swelling    Leg edema   Tessalon [Benzonatate] Other (See Comments)    Lip swelling    Medications Reviewed Today     Reviewed by Leida Lauth, CMA (Certified Medical Assistant) on 10/29/22 at 1020  Med List Status: <None>   Medication Order Taking? Sig Documenting Provider Last Dose Status Informant  aspirin EC 81 MG tablet [id number] Yes Take 1 tablet (81 mg total) by mouth daily. Swallow whole. Autry-Lott, Randa Evens, DO Taking Active   atorvastatin (LIPITOR) 10 MG tablet [id number] Yes Take 1 tablet (10 mg total) by mouth daily. Westley Chandler, MD Taking Active   cetirizine (ZYRTEC) 10 MG tablet [id number] Yes Take 1 tablet (10 mg total) by mouth daily. Westley Chandler, MD Taking Active   FLUoxetine (PROZAC) 20 MG capsule [id number] Yes Take 1 capsule (20 mg total) by mouth daily. Westley Chandler, MD Taking Active   hydroxychloroquine (PLAQUENIL) 200 MG tablet [id number] Yes Take 2 tablets (400 mg total) by mouth at bedtime. Westley Chandler, MD Taking Active   indapamide (LOZOL) 2.5 MG tablet [id number] Yes Take 1 tablet (2.5 mg total) by mouth daily. Westley Chandler, MD Taking Active   losartan (COZAAR) 100 MG tablet [id number] Yes Take 1 tablet (100 mg total) by mouth daily. Westley Chandler, MD Taking Active   meloxicam [facility]) 15 MG tablet [id number] Yes Take 1 tablet (15 mg total) by mouth daily. Lenda Kelp, MD Taking Active   polyethylene glycol powder (GLYCOLAX/MIRALAX) 17 GM/SCOOP powder [id number] Yes Take 17 g by mouth 2 (two) times daily as needed. Westley Chandler, MD Taking Active   Discontinued  10/29/22 1020   spironolactone (ALDACTONE) 25 MG tablet [id number] Yes Take 1 tablet (25 mg total) by mouth daily. Westley Chandler, MD Taking Active   topiramate (TOPAMAX) 25 MG tablet [id number] Yes Take 2 tablets at bedtime Westley Chandler, MD Taking Active   Med List Note Glori Luis, MD 07/16/14 [phone]):               Patient Active  Problem List   Diagnosis Date Noted   Esophageal dysphagia 04/15/2020   Mood disorder (HCC) 11/13/2019   Atherosclerosis of aorta (HCC) 07/15/2019   Prediabetes 07/13/2019   Chronic abdominal pain 07/13/2019   Insomnia 12/15/2012   Essential hypertension, benign 04/10/2012   SLE (systemic lupus erythematosus) (HCC) 04/10/2012    Conditions to be addressed/monitored per PCP order:   Mood Disorder  Care Plan : General Social Work (Adult)  Updates made by Gustavus Bryant, LCSW since 11/06/2022 12:00 AM     Problem: Emotional Distress      Goal: Emotional Health Supported by connecting for ongoing Therapy   Start Date: 02/02/2021  Expected End Date: 04/29/2021  Recent Progress: On track  Priority: High  Note:   Priority: High  Timeframe:  Short-Range Goal Priority:  High Start Date:   10/23/22           Expected End Date:  ongoing                     Follow Up Date--11/20/22 at 9 am  Current Barriers:  Patient has not been able to connect to counseling/resources provided Disease Management support and education needs related to Mood Instability  CSW Clinical Goal(s):  Patient  will contact providers discussed today and will make decision on where she wishes to gain therapy  Interventions: Inter-disciplinary care team collaboration (see longitudinal plan of care) Evaluation of current treatment plan related to  self management and patient's adherence to plan as established by provider Establishing healthy boundaries emphasized and healthy self-care education provided Patient was educated on available mental health resources within their area that accept Medicaid and offer counseling and psychiatry.  Patient educated on the difference between therapy and psychiatry per patient request Email sent to patient today with available mental health resources within her area that accept Medicaid and offer the services that she is interested in. Email included instructions for scheduling  at [facility] as well as some crisis support resources and [facility]'s walk in clinic hours. Patient will review resources and make a decision regarding where she wishes to gain MH treatment at. Patient will work on implementing appropriate self-care habits into their daily routine such as: staying positive, writing a gratitude list, drinking water, staying active around the house, taking their medications as prescribed, combating negative thoughts or emotions and staying connected with their family and friends. Positive reinforcement provided for this decision to work on this. Motivational Interviewing employed Depression screen reviewed  PHQ2/ PHQ9 completed or reviewed  Mindfulness or Relaxation training provided Active listening / Reflection utilized  Advance Care and HCPOA education provided Emotional Support Provided Problem Solving /Task Center strategies reviewed Provided psychoeducation for mental health needs  Provided brief CBT  Reviewed mental health medications and discussed importance of compliance:  Quality of sleep assessed & Sleep Hygiene techniques promoted  Participation in counseling encouraged  Verbalization of feelings encouraged  Suicidal Ideation/Homicidal Ideation assessed: Patient denies SI/HI  Review resources, discussed options and provided patient information about  Mental Health Resources Inter-disciplinary care team  collaboration (see longitudinal plan of care) 11/06/22 Update- Patient is interested in gaining therapy at [facility] and referral was successfully placed by [facility] LCSW today for therapy. Email was sent to patient and extensive education on the walk in clinic at [facility] was provided to patient as well.  Patient Goals/Self-Care Activities: Over the next 90 days Attend scheduled medical appointments Utilize healthy coping skills and supportive resources discussed Contact PCP with any questions or concerns Keep 90 percent of counseling appointments Call your  insurance provider for more information about your Enhanced Benefits  Check out counseling resources provided  Begin personal counseling with LCSW, to reduce and manage symptoms of Depression and Stress, until well-established with mental health provider Incorporate into daily practice - relaxation techniques, deep breathing exercises, and mindfulness meditation strategies. Talk about feelings with friends, family members, spiritual advisor, etc. Contact LCSW directly [phone]), if you have questions, need assistance, or if additional social work needs are identified between now and our next scheduled telephone outreach call. Call 988 for mental health hotline/crisis line if needed (24/7 available) Try techniques to reduce symptoms of anxiety/negative thinking (deep breathing, distraction, positive self talk, etc)  - develop a personal safety plan - develop a plan to deal with triggers like holidays, anniversaries - exercise at least 2 to 3 times per week - have a plan for how to handle bad days - journal feelings and what helps to feel better or worse - spend time or talk with others at least 2 to 3 times per week - watch for early signs of feeling worse - begin personal counseling - call and visit an old friend - check out volunteer opportunities - join a support group - laugh; watch a funny movie or comedian - learn and use visualization or guided imagery - perform a random act of kindness - practice relaxation or meditation daily - start or continue a personal journal - practice positive thinking and self-talk -continue with compliance of taking medication  -identify current effective and ineffective coping strategies.  -implement positive self-talk in care to increase self-esteem, confidence and feelings of control.  -consider alternative and complementary therapy approaches such as meditation, mindfulness or yoga.  -journaling, prayer, worship services, meditation or pastoral  counseling.  -increase participation in pleasurable group activities such as hobbies, singing, sports or volunteering).  -consider the use of meditative movement therapy such as tai chi, yoga or qigong.  -start a regular daily exercise program based on tolerance, ability and patient choice to support positive thinking and activity    If you are experiencing a Mental Health or Behavioral Health Crisis or need someone to talk to, please call the Suicide and Crisis Lifeline: 988    Patient Goals: Follow up goal        10/12/2022   10:21 AM 08/03/2022   10:19 AM 01/16/2022   10:31 AM 10/20/2021   10:34 AM 09/01/2021   10:20 AM  Depression screen PHQ 2/9  Decreased Interest 2 1 2 1 2   Down, Depressed, Hopeless 2 1 2 1 2   PHQ - 2 Score 4 2 4 2 4   Altered sleeping 2 1 0 1 2  Tired, decreased energy 2 1 2 2 2   Change in appetite 2 2 2 2 2   Feeling bad or failure about yourself  0 0 0 0 1  Trouble concentrating 2 1 2 1 2   Moving slowly or fidgety/restless 0 0 2 1 2   Suicidal thoughts 0 0 0 0 0  PHQ-9 Score 12 7 12 9 15   Difficult doing work/chores    Not difficult at all       12/08/2019    8:49 AM 09/07/2019   11:19 AM  GAD 7 : Generalized Anxiety Score  Nervous, Anxious, on Edge 2 2  Control/stop worrying 2 1  Worry too much - different things 2 1  Trouble relaxing 3 2  Restless 2 2  Easily annoyed or irritable 0 1  Afraid - awful might happen 0 1  Total GAD 7 Score 11 10  Anxiety Difficulty Somewhat difficult        24- Hour Availability:    [facility]  [address] [place], [place] [phone] Crisis [phone]   [facility] [phone]  [facility]  [phone]    [facility]  [phone] (after hours)   Therapeutic Alternative/Mobile Crisis   [phone]   [place] National Suicide Hotline  2138507823 Len Child[place]) [place] 160   Call [phone] for mental health emergencies    [facility]  [phone]);  [place] and [employer]  [phone]); [place], [place], [place], [place], [place], [place], [place]    [facility] For 24/7 walk-up access to mental health services for [facility] children (4+), adolescents and adults, please visit the [facility] located at [address] in [place], [place].  *[facility] also provides comprehensive outpatient behavioral health services in a variety of locations around the [place].  Connect With [place] [address] [place], [place] [address] HelpLine: [phone] or 1-([phone]  Get Directions  Find Help 24/7 By Phone Call our 24-hour HelpLine at (916)660-7057 or 617-119-9431 for immediate assistance for mental health and substance abuse issues.  Walk-In Help [place]: [facility] (Ages 4 and Up) [place]: Emergency Dept., [facility] Additional Resources National Hopeline Network: 1-800-SUICIDE The National Suicide Prevention Lifeline: 1-800-273-TALK      Follow up:  Patient agrees to Care Plan and Follow-up.  Plan: The Managed Medicaid care management team will reach out to the patient again over the next 30 days.  Dickie La, BSW, MSW, [employer] Managed Medicaid LCSW [facility]  [facility] [email or web address] Phone: [phone]

## 2022-11-06 NOTE — Patient Instructions (Signed)
Visit Information  Thank you for taking time to visit with me today. Please don't hesitate to contact me if I can be of assistance to you.   Following are the goals we discussed today:   Our next appointment is by telephone on 11/20/22 at 9 am  If you are experiencing a Mental Health or Behavioral Health Crisis or need someone to talk to, please call the Suicide and Crisis Lifeline: 988 call the [place] National Suicide Prevention Lifeline: 4502540091 or TTY: 443-236-7657 TTY (239)539-0257) to talk to a trained counselor call 1-800-273-TALK (toll free, 24 hour hotline) go to [facility] [address] [place] [phone])   Patient verbalizes understanding of instructions and care plan provided today and agrees to view in MyChart. Active MyChart status and patient understanding of how to access instructions and care plan via MyChart confirmed with patient.     The following coping skill education was provided for stress relief and mental health management: "When your car dies or a deadline looms, how do you respond? Long-term, low-grade or acute stress takes a serious toll on your body and mind, so don't ignore feelings of constant tension. Stress is a natural part of life. However, too much stress can harm our health, especially if it continues every day. This is chronic stress and can put you at risk for heart problems like heart disease and depression. Understand what's happening inside your body and learn simple coping skills to combat the negative impacts of everyday stressors.  Types of Stress There are two types of stress: Emotional - types of emotional stress are relationship problems, pressure at work, financial worries, experiencing discrimination or having a major life change. Physical - Examples of physical stress include being sick having pain, not sleeping well, recovery from an injury or having an alcohol and drug use disorder. Fight or  Flight Sudden or ongoing stress activates your nervous system and floods your bloodstream with adrenaline and cortisol, two hormones that raise blood pressure, increase heart rate and spike blood sugar. These changes pitch your body into a fight or flight response. That enabled our ancestors to outrun saber-toothed tigers, and it's helpful today for situations like dodging a car accident. But most modern chronic stressors, such as finances or a challenging relationship, keep your body in that heightened state, which hurts your health. Effects of Too Much Stress If constantly under stress, most of [place] will eventually start to function less well.  Multiple studies link chronic stress to a higher risk of heart disease, stroke, depression, weight gain, memory loss and even premature death, so it's important to recognize the warning signals. Talk to your doctor about ways to manage stress if you're experiencing any of these symptoms: Prolonged periods of poor sleep. Regular, severe headaches. Unexplained weight loss or gain. Feelings of isolation, withdrawal or worthlessness. Constant anger and irritability. Loss of interest in activities. Constant worrying or obsessive thinking. Excessive alcohol or drug use. Inability to concentrate.  10 Ways to Cope with Chronic Stress It's key to recognize stressful situations as they occur because it allows you to focus on managing how you react. We all need to know when to close our eyes and take a deep breath when we feel tension rising. Use these tips to prevent or reduce chronic stress. 1. Rebalance Work and Home All work and no play? If you're spending too much time at the office, intentionally put more dates in your calendar to enjoy time for fun, either  alone or with others. 2. Get Regular Exercise Moving your body on a regular basis balances the nervous system and increases blood circulation, helping to flush out stress hormones. Even a daily 20-minute  walk makes a difference. Any kind of exercise can lower stress and improve your mood ? just pick activities that you enjoy and make it a regular habit. 3. Eat Well and Limit Alcohol and Stimulants Alcohol, nicotine and caffeine may temporarily relieve stress but have negative health impacts and can make stress worse in the long run. Well-nourished bodies cope better, so start with a good breakfast, add more organic fruits and vegetables for a well-balanced diet, avoid processed foods and sugar, try herbal tea and drink more water. 4. Connect with Supportive People Talking face to face with another person releases hormones that reduce stress. Lean on those good listeners in your life. 5. Carve Out Hobby Time Do you enjoy gardening, reading, listening to music or some other creative pursuit? Engage in activities that bring you pleasure and joy; research shows that reduces stress by almost half and lowers your heart rate, too. 6. Practice Meditation, Stress Reduction or Yoga Relaxation techniques activate a state of restfulness that counterbalances your body's fight-or-flight hormones. Even if this also means a 10-minute break in a long day: listen to music, read, go for a walk in nature, do a hobby, take a bath or spend time with a friend. Also consider doing a mindfulness exercise or try a daily deep breathing or imagery practice. Deep Breathing Slow, calm and deep breathing can help you relax. Try these steps to focus on your breathing and repeat as needed. Find a comfortable position and close your eyes. Exhale and drop your shoulders. Breathe in through your nose; fill your lungs and then your belly. Think of relaxing your body, quieting your mind and becoming calm and peaceful. Breathe out slowly through your nose, relaxing your belly. Think of releasing tension, pain, worries or distress. Repeat steps three and four until you feel relaxed. Imagery This involves using your mind to excite the  senses -- sound, vision, smell, taste and feeling. This may help ease your stress. Begin by getting comfortable and then do some slow breathing. Imagine a place you love being at. It could be somewhere from your childhood, somewhere you vacationed or just a place in your imagination. Feel how it is to be in the place you're imagining. Pay attention to the sounds, air, colors, and who is there with you. This is a place where you feel cared for and loved. All is well. You are safe. Take in all the smells, sounds, tastes and feelings. As you do, feel your body being nourished and healed. Feel the calm that surrounds you. Breathe in all the good. Breathe out any discomfort or tension. 7. Sleep Enough If you get less than seven to eight hours of sleep, your body won't tolerate stress as well as it could. If stress keeps you up at night, address the cause, and add extra meditation into your day to make up for the lost z's. Try to get seven to nine hours of sleep each night. Make a regular bedtime schedule. Keep your room dark and cool. Try to avoid computers, TV, cell phones and tablets before bed. 8. Bond with Connections You Enjoy Go out for a coffee with a friend, chat with a neighbor, call a family member, visit with a clergy member, or even hang out with your pet. Clinical studies show that  spending even a short time with a companion animal can cut anxiety levels almost in half. 9. Take a Vacation Getting away from it all can reset your stress tolerance by increasing your mental and emotional outlook, which makes you a happier, more productive person upon return. Leave your cellphone and laptop at home! 10. See a Counselor, Coach or Therapist If negative thoughts overwhelm your ability to make positive changes, it's time to seek professional help. Make an appointment today--your health and life are worth it."

## 2022-11-20 ENCOUNTER — Other Ambulatory Visit: Payer: 59 | Admitting: Licensed Clinical Social Worker

## 2022-11-20 DIAGNOSIS — F39 Unspecified mood [affective] disorder: Secondary | ICD-10-CM

## 2022-11-20 NOTE — Patient Instructions (Signed)
Visit Information  Ms. Zamarron was given information about Medicaid Managed Care team care coordination services as a part of their [employer] Medicaid benefit. [name] verbally consentedto engagement with the [facility] Managed Care team.   If you are experiencing a medical emergency, please call 911 or report to your local emergency department or urgent care.   If you have a non-emergency medical problem during routine business hours, please contact your provider's office and ask to speak with a nurse.   For questions related to your [facility] [facility] health plan, please call: [phone]  OR visit the member homepage at: [email or web address]  If you would like to schedule transportation through your [facility] plan, please call the following number at least 2 days in advance of your appointment: [phone]  If you are experiencing a behavioral health crisis, call the [employer] [facility] Crisis Line at [phone]). The line is available 24 hours a day, seven days a week.  If you would like help to quit smoking, call 1-800-QUIT-NOW (540 143 9996) OR Espaol: 1-855-Djelo-Ya (0-630-160-1093) o para ms informacin haga clic aqu or Text READY to 235-573 to register via text   Following is a copy of your plan of care:  Care Plan : General Social Work (Adult)  Updates made by Gustavus Bryant, LCSW since 11/20/2022 12:00 AM     Problem: Emotional Distress      Goal: Emotional Health Supported by connecting for ongoing Therapy   Start Date: 02/02/2021  Expected End Date: 04/29/2021  Recent Progress: On track  Priority: High  Note:   Priority: High  Timeframe:  Short-Range Goal Priority:  High Start Date:   10/23/22           Expected End Date:  ongoing                     Follow Up Date--12/05/22 at 9:15 am  Current Barriers:  Patient has  not been able to connect to counseling/resources provided Disease Management support and education needs related to Mood Instability  CSW Clinical Goal(s):  Patient  will contact providers discussed today and will make decision on where she wishes to gain therapy  Interventions: Inter-disciplinary care team collaboration (see longitudinal plan of care) Evaluation of current treatment plan related to  self management and patient's adherence to plan as established by provider Establishing healthy boundaries emphasized and healthy self-care education provided Patient was educated on available mental health resources within their area that accept Medicaid and offer counseling and psychiatry.  Patient educated on the difference between therapy and psychiatry per patient request Email sent to patient today with available mental health resources within her area that accept Medicaid and offer the services that she is interested in. Email included instructions for scheduling at [facility] as well as some crisis support resources and [facility]'s walk in clinic hours. Patient will review resources and make a decision regarding where she wishes to gain MH treatment at. Patient will work on implementing appropriate self-care habits into their daily routine such as: staying positive, writing a gratitude list, drinking water, staying active around the house, taking their medications as prescribed, combating negative thoughts or emotions and staying connected with their family and friends. Positive reinforcement provided for this decision to work on this. Motivational Interviewing employed Depression screen reviewed  PHQ2/ PHQ9 completed or reviewed  Mindfulness or Relaxation training provided Active listening / Reflection utilized  Advance Care and HCPOA education provided Emotional  Support Provided Problem Solving /Task Center strategies reviewed Provided psychoeducation for mental health needs  Provided brief CBT   Reviewed mental health medications and discussed importance of compliance:  Quality of sleep assessed & Sleep Hygiene techniques promoted  Participation in counseling encouraged  Verbalization of feelings encouraged  Suicidal Ideation/Homicidal Ideation assessed: Patient denies SI/HI  Review resources, discussed options and provided patient information about  Mental Health Resources Inter-disciplinary care team collaboration (see longitudinal plan of care) 11/06/22 Update- Patient is interested in gaining therapy at [facility] and referral was successfully placed by [facility] LCSW today for therapy. Email was sent to patient and extensive education on the walk in clinic at [facility] was provided to patient as well. 7/23/24[facility] LCSW completed joint phone call with patient to [facility] and was informed that [facility] denied her referral [facility] LCSW made on 11/06/22 because with their grants, patients must have a history of substance abuse and she does not. 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 Patient wishes to try [facility]. New referral placed with patient's permission on 11/20/22.  Patient Goals/Self-Care Activities: Over the next 90 days Attend scheduled medical appointments Utilize healthy coping skills and supportive resources discussed Contact PCP with any questions or concerns Keep 90 percent of counseling appointments Call your insurance provider for more information about your Enhanced Benefits  Check out counseling resources provided  Begin personal counseling with LCSW, to reduce and manage symptoms of Depression and Stress, until well-established with mental health provider Incorporate into daily practice - relaxation techniques, deep breathing exercises, and mindfulness meditation strategies. Talk about feelings with friends, family members, spiritual advisor, etc. Contact LCSW directly [phone]), if  you have questions, need assistance, or if additional social work needs are identified between now and our next scheduled telephone outreach call. Call 988 for mental health hotline/crisis line if needed (24/7 available) Try techniques to reduce symptoms of anxiety/negative thinking (deep breathing, distraction, positive self talk, etc)  - develop a personal safety plan - develop a plan to deal with triggers like holidays, anniversaries - exercise at least 2 to 3 times per week - have a plan for how to handle bad days - journal feelings and what helps to feel better or worse - spend time or talk with others at least 2 to 3 times per week - watch for early signs of feeling worse - begin personal counseling - call and visit an old friend - check out volunteer opportunities - join a support group - laugh; watch a funny movie or comedian - learn and use visualization or guided imagery - perform a random act of kindness - practice relaxation or meditation daily - start or continue a personal journal - practice positive thinking and self-talk -continue with compliance of taking medication  -identify current effective and ineffective coping strategies.  -implement positive self-talk in care to increase self-esteem, confidence and feelings of control.  -consider alternative and complementary therapy approaches such as meditation, mindfulness or yoga.  -journaling, prayer, worship services, meditation or pastoral counseling.  -increase participation in pleasurable group activities such as hobbies, singing, sports or volunteering).  -consider the use of meditative movement therapy such as tai chi, yoga or qigong.  -start a regular daily exercise program based on tolerance, ability and patient choice to support positive thinking and activity    If you are experiencing a Mental Health or Behavioral Health Crisis or need someone  to talk to, please call the Suicide and Crisis Lifeline: 988        24- Hour Availability:    [facility]  [address] [place], [place] [phone] Crisis [phone]   [facility] [phone]  [place] Crisis [facility]  [phone]    [facility]  [phone] (after hours)   Therapeutic Alternative/Mobile Crisis   [phone]   [place] National Suicide Hotline  615 464 8149 Len Childs) [place] 093   Call [phone] for mental health emergencies   [facility]  [phone]);  [place] and [employer]  [phone]); [place], [place], [place], [place], [place], [place], [place]    [place] [facility] for [facility] Residents For 24/7 walk-up access to mental health services for [facility] children (4+), adolescents and adults, please visit the [facility] located at [address] in [place], [place].  *[place] also provides comprehensive outpatient behavioral health services in a variety of locations around the [place].  Connect With [place] [address] [place], [place] [address] HelpLine: (581)497-2003 or 1-207 130 1217  Get Directions  Find Help 24/7 By Phone Call our 24-hour HelpLine at 9302854708 or (857)072-1646 for immediate assistance for mental health and substance abuse issues.  Walk-In Help [place]: [facility] (Ages 4 and Up) [place]: Emergency Dept., [facility] Additional Resources National Hopeline Network: 1-800-SUICIDE The National Suicide Prevention Lifeline: 2-671-245-YKDX      Patient Goals: Follow up goal  Dickie La, BSW, MSW, [employer] Managed Medicaid LCSW [facility]: [place] Rhonda W  [facility] [email or web address] Phone: [phone]

## 2022-11-20 NOTE — Patient Outreach (Signed)
Medicaid Managed Care Social Work Note  11/20/2022 Name:  Veronica [name] MRN:  [id number] DOB:  Sep 03, 1960  Veronica [name] is an 62 y.o. year old female who is a primary patient of Westley Chandler, MD.  The Medicaid Managed Care Coordination team was consulted for assistance with:  Mental Health Counseling and Resources  Ms. Redditt was given information about Medicaid Managed Care Coordination team services today. Veronica [name] Patient agreed to services and verbal consent obtained.  Engaged with patient  for by telephone forfollow up visit in response to referral for case management and/or care coordination services.   Assessments/Interventions:  Review of past medical history, allergies, medications, health status, including review of consultants reports, laboratory and other test data, was performed as part of comprehensive evaluation and provision of chronic care management services.  SDOH: (Social Determinant of Health) assessments and interventions performed: SDOH Interventions    Flowsheet Row Patient Outreach Telephone from 11/06/2022 in [place] [facility] Patient Outreach Telephone from 10/23/2022 in [place] [facility]ATION [facility] DEPARTMENT Office Visit from 01/16/2022 in [facility] Office Visit from 09/01/2021 in [facility] Office Visit from 04/07/2021 in [facility] Chronic Care Management from 02/02/2021 in [facility]  SDOH Interventions        Housing Interventions -- Intervention Not Indicated -- -- -- --  Depression Interventions/Treatment  -- -- Counseling Currently on Treatment, Counseling Currently on Treatment --  Stress Interventions Offered [employer], Provide Counseling -- -- -- -- Other (Comment), Provide Counseling  [referral]       Advanced Directives Status:  See Care Plan for related entries.  Care Plan                 Allergies   Allergen Reactions   Amlodipine Swelling    Leg edema   Tessalon [Benzonatate] Other (See Comments)    Lip swelling    Medications Reviewed Today     Reviewed by Leida Lauth, CMA (Certified Medical Assistant) on 10/29/22 at 1020  Med List Status: <None>   Medication Order Taking? Sig Documenting Provider Last Dose Status Informant  aspirin EC 81 MG tablet [id number] Yes Take 1 tablet (81 mg total) by mouth daily. Swallow whole. Autry-Lott, Randa Evens, DO Taking Active   atorvastatin (LIPITOR) 10 MG tablet [id number] Yes Take 1 tablet (10 mg total) by mouth daily. Westley Chandler, MD Taking Active   cetirizine (ZYRTEC) 10 MG tablet [id number] Yes Take 1 tablet (10 mg total) by mouth daily. Westley Chandler, MD Taking Active   FLUoxetine (PROZAC) 20 MG capsule [id number] Yes Take 1 capsule (20 mg total) by mouth daily. Westley Chandler, MD Taking Active   hydroxychloroquine (PLAQUENIL) 200 MG tablet [id number] Yes Take 2 tablets (400 mg total) by mouth at bedtime. Westley Chandler, MD Taking Active   indapamide (LOZOL) 2.5 MG tablet [id number] Yes Take 1 tablet (2.5 mg total) by mouth daily. Westley Chandler, MD Taking Active   losartan (COZAAR) 100 MG tablet [id number] Yes Take 1 tablet (100 mg total) by mouth daily. Westley Chandler, MD Taking Active   meloxicam [facility]) 15 MG tablet [id number] Yes Take 1 tablet (15 mg total) by mouth daily. Lenda Kelp, MD Taking Active   polyethylene glycol powder (GLYCOLAX/MIRALAX) 17 GM/SCOOP powder [id number] Yes Take 17 g by mouth 2 (two) times daily as needed. Westley Chandler, MD Taking Active  Discontinued 10/29/22 1020   spironolactone (ALDACTONE) 25 MG tablet [id number] Yes Take 1 tablet (25 mg total) by mouth daily. Westley Chandler, MD Taking Active   topiramate (TOPAMAX) 25 MG tablet [id number] Yes Take 2 tablets at bedtime Westley Chandler, MD Taking Active   Med List Note Glori Luis, MD 07/16/14 [phone]):               Patient Active  Problem List   Diagnosis Date Noted   Esophageal dysphagia 04/15/2020   Mood disorder (HCC) 11/13/2019   Atherosclerosis of aorta (HCC) 07/15/2019   Prediabetes 07/13/2019   Chronic abdominal pain 07/13/2019   Insomnia 12/15/2012   Essential hypertension, benign 04/10/2012   SLE (systemic lupus erythematosus) (HCC) 04/10/2012    Conditions to be addressed/monitored per PCP order:   Mood Disorder  Care Plan : General Social Work (Adult)  Updates made by Gustavus Bryant, LCSW since 11/20/2022 12:00 AM     Problem: Emotional Distress      Goal: Emotional Health Supported by connecting for ongoing Therapy   Start Date: 02/02/2021  Expected End Date: 04/29/2021  Recent Progress: On track  Priority: High  Note:   Priority: High  Timeframe:  Short-Range Goal Priority:  High Start Date:   10/23/22           Expected End Date:  ongoing                     Follow Up Date--12/05/22 at 9:15 am  Current Barriers:  Patient has not been able to connect to counseling/resources provided Disease Management support and education needs related to Mood Instability  CSW Clinical Goal(s):  Patient  will contact providers discussed today and will make decision on where she wishes to gain therapy  Interventions: Inter-disciplinary care team collaboration (see longitudinal plan of care) Evaluation of current treatment plan related to  self management and patient's adherence to plan as established by provider Establishing healthy boundaries emphasized and healthy self-care education provided Patient was educated on available mental health resources within their area that accept Medicaid and offer counseling and psychiatry.  Patient educated on the difference between therapy and psychiatry per patient request Email sent to patient today with available mental health resources within her area that accept Medicaid and offer the services that she is interested in. Email included instructions for  scheduling at [facility] as well as some crisis support resources and [facility]'s walk in clinic hours. Patient will review resources and make a decision regarding where she wishes to gain MH treatment at. Patient will work on implementing appropriate self-care habits into their daily routine such as: staying positive, writing a gratitude list, drinking water, staying active around the house, taking their medications as prescribed, combating negative thoughts or emotions and staying connected with their family and friends. Positive reinforcement provided for this decision to work on this. Motivational Interviewing employed Depression screen reviewed  PHQ2/ PHQ9 completed or reviewed  Mindfulness or Relaxation training provided Active listening / Reflection utilized  Advance Care and HCPOA education provided Emotional Support Provided Problem Solving /Task Center strategies reviewed Provided psychoeducation for mental health needs  Provided brief CBT  Reviewed mental health medications and discussed importance of compliance:  Quality of sleep assessed & Sleep Hygiene techniques promoted  Participation in counseling encouraged  Verbalization of feelings encouraged  Suicidal Ideation/Homicidal Ideation assessed: Patient denies SI/HI  Review resources, discussed options and provided patient information about  Mental Health Resources Inter-disciplinary care  team collaboration (see longitudinal plan of care) 11/06/22 Update- Patient is interested in gaining therapy at [facility] and referral was successfully placed by [facility] LCSW today for therapy. Email was sent to patient and extensive education on the walk in clinic at [facility] was provided to patient as well. 7/23/24[facility] LCSW completed joint phone call with patient to [facility] and was informed that [facility] denied her referral [facility] LCSW made on 11/06/22 because with their grants, patients must have a history of substance abuse and she does  not. 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 Patient wishes to try [facility] at [facility]. New referral placed with patient's permission on 11/20/22.  Patient Goals/Self-Care Activities: Over the next 90 days Attend scheduled medical appointments Utilize healthy coping skills and supportive resources discussed Contact PCP with any questions or concerns Keep 90 percent of counseling appointments Call your insurance provider for more information about your Enhanced Benefits  Check out counseling resources provided  Begin personal counseling with LCSW, to reduce and manage symptoms of Depression and Stress, until well-established with mental health provider Incorporate into daily practice - relaxation techniques, deep breathing exercises, and mindfulness meditation strategies. Talk about feelings with friends, family members, spiritual advisor, etc. Contact LCSW directly [phone]), if you have questions, need assistance, or if additional social work needs are identified between now and our next scheduled telephone outreach call. Call 988 for mental health hotline/crisis line if needed (24/7 available) Try techniques to reduce symptoms of anxiety/negative thinking (deep breathing, distraction, positive self talk, etc)  - develop a personal safety plan - develop a plan to deal with triggers like holidays, anniversaries - exercise at least 2 to 3 times per week - have a plan for how to handle bad days - journal feelings and what helps to feel better or worse - spend time or talk with others at least 2 to 3 times per week - watch for early signs of feeling worse - begin personal counseling - call and visit an old friend - check out volunteer opportunities - join a support group - laugh; watch a funny movie or comedian - learn and use visualization or guided imagery - perform a random act of kindness - practice relaxation or meditation  daily - start or continue a personal journal - practice positive thinking and self-talk -continue with compliance of taking medication  -identify current effective and ineffective coping strategies.  -implement positive self-talk in care to increase self-esteem, confidence and feelings of control.  -consider alternative and complementary therapy approaches such as meditation, mindfulness or yoga.  -journaling, prayer, worship services, meditation or pastoral counseling.  -increase participation in pleasurable group activities such as hobbies, singing, sports or volunteering).  -consider the use of meditative movement therapy such as tai chi, yoga or qigong.  -start a regular daily exercise program based on tolerance, ability and patient choice to support positive thinking and activity    If you are experiencing a Mental Health or Behavioral Health Crisis or need someone to talk to, please call the Suicide and Crisis Lifeline: 988    Patient Goals: Follow up goal        24- Hour Availability:    [facility]  [address] [place], [place] [phone] Crisis [phone]   [facility] [phone]  [employer] Crisis Service  [phone]    [facility] [employer] Crisis Services  [phone] (after hours)   Therapeutic Alternative/Mobile Crisis   [phone]   [place] National Suicide Hotline  (939)708-1022 Len Childs) [place] 956   Call [phone] for mental health emergencies   [facility]  [phone]);  [place] and [employer]  [phone]); [place], [place], [place], [place], [place], [place], [place]    [place] [facility] for [facility] Residents For 24/7 walk-up access to mental health services for [facility] children (4+), adolescents and adults, please visit the [facility] located at [address] in [place], [place].  *[place]  also provides comprehensive outpatient behavioral health services in a variety of locations around the [place].  Connect With [place] [address] [place], [place] [address] HelpLine: 708-511-6728 or 1-(318)319-2089  Get Directions  Find Help 24/7 By Phone Call our 24-hour HelpLine at 510-003-4208 or 838-186-8367 for immediate assistance for mental health and substance abuse issues.  Walk-In Help [place]: [facility] (Ages 4 and Up) [place]: Emergency Dept., [facility] Additional Resources National Hopeline Network: 1-800-SUICIDE The National Suicide Prevention Lifeline: 1-800-273-TALK        10/12/2022   10:21 AM 08/03/2022   10:19 AM 01/16/2022   10:31 AM 10/20/2021   10:34 AM 09/01/2021   10:20 AM  Depression screen PHQ 2/9  Decreased Interest 2 1 2 1 2   Down, Depressed, Hopeless 2 1 2 1 2   PHQ - 2 Score 4 2 4 2 4   Altered sleeping 2 1 0 1 2  Tired, decreased energy 2 1 2 2 2   Change in appetite 2 2 2 2 2   Feeling bad or failure about yourself  0 0 0 0 1  Trouble concentrating 2 1 2 1 2   Moving slowly or fidgety/restless 0 0 2 1 2   Suicidal thoughts 0 0 0 0 0  PHQ-9 Score 12 7 12 9 15   Difficult doing work/chores    Not difficult at all       12/08/2019    8:49 AM 09/07/2019   11:19 AM  GAD 7 : Generalized Anxiety Score  Nervous, Anxious, on Edge 2 2  Control/stop worrying 2 1  Worry too much - different things 2 1  Trouble relaxing 3 2  Restless 2 2  Easily annoyed or irritable 0 1  Afraid - awful might happen 0 1  Total GAD 7 Score 11 10  Anxiety Difficulty Somewhat difficult    Follow up:  Patient agrees to Care Plan and Follow-up.  Plan: The Managed Medicaid care management team will reach out to the patient again over the next 30 days.12/05/22 at 9:15 am  Dickie La, BSW, MSW, [employer] Managed Medicaid LCSW [facility]  [facility] [email or web address] Phone:  [phone]

## 2022-12-05 ENCOUNTER — Encounter: Payer: Self-pay | Admitting: Physician Assistant

## 2022-12-05 ENCOUNTER — Ambulatory Visit (INDEPENDENT_AMBULATORY_CARE_PROVIDER_SITE_OTHER): Payer: 59 | Admitting: Physician Assistant

## 2022-12-05 VITALS — BP 140/90 | Ht 64.0 in | Wt 238.0 lb

## 2022-12-05 DIAGNOSIS — K5900 Constipation, unspecified: Secondary | ICD-10-CM

## 2022-12-05 DIAGNOSIS — Z8719 Personal history of other diseases of the digestive system: Secondary | ICD-10-CM

## 2022-12-05 DIAGNOSIS — R1310 Dysphagia, unspecified: Secondary | ICD-10-CM

## 2022-12-05 DIAGNOSIS — R109 Unspecified abdominal pain: Secondary | ICD-10-CM | POA: Diagnosis not present

## 2022-12-05 MED ORDER — AMOXICILLIN-POT CLAVULANATE 875-125 MG PO TABS: 1.0000 | ORAL_TABLET | Freq: Two times a day (BID) | ORAL | 0 refills | Status: DC

## 2022-12-05 NOTE — Progress Notes (Signed)
Subjective:    Patient ID: Veronica [name], female    DOB: June 14, 1960, 62 y.o.   MRN: [id number]  HPI Veronica [name] is a pleasant 62 year old African-American female, new to GI today, accepted by Dr. Lavon Paganini who had previously been seen by Dr. Elnoria Howard. She has history of recurrent diverticulitis, had undergone colonoscopy in 2017 per Dr. Elnoria Howard with finding of sigmoid diverticulosis and one 3 mm polyp was removed from the rectosigmoid which by path was hyperplastic. She has not had prior EGD. She does have history of hypertension, SLE, some component of chronic abdominal pain and a mood disorder. She says that she had an episode of diverticulitis in the spring 2024, had tried to get into see Dr. Elnoria Howard for an antibiotic but was not able to be seen as they were no longer excepting her insurance.  She will end up taking laxatives to purge her bowel and says eventually the pain resolved and she did not require an antibiotic. She has done well over the summer but says just this morning she developed some slight pain in her left abdomen which is new and reminiscent of diverticulitis but currently is not severe.  She has not had any fever or chills feels okay.  She says her bowel movements have been okay recently has been taking MiraLAX and Benefiber on a regular basis. She says she has been experiencing over the past months intermittent episodes of food "coming back up into her mouth especially with burping.  She does not have any regular heartburn or indigestion.  She does not have any dysphagia to liquids but has noticed dysphagia to solids with a sense of food sitting in her chest.  She usually has to drink water to get food to go down, is not having episodes of this requiring regurgitation. She is not on any chronic PPI therapy.  Review of Systems Pertinent positive and negative review of systems were noted in the above HPI section.  All other review of systems was otherwise negative.   Outpatient Encounter  Medications as of 12/05/2022  Medication Sig   amoxicillin-clavulanate (AUGMENTIN) 875-125 MG tablet Take 1 tablet by mouth 2 (two) times daily.   aspirin EC 81 MG tablet Take 1 tablet (81 mg total) by mouth daily. Swallow whole.   atorvastatin (LIPITOR) 10 MG tablet Take 1 tablet (10 mg total) by mouth daily.   cetirizine (ZYRTEC) 10 MG tablet Take 1 tablet (10 mg total) by mouth daily.   FLUoxetine (PROZAC) 20 MG capsule Take 1 capsule (20 mg total) by mouth daily.   hydroxychloroquine (PLAQUENIL) 200 MG tablet Take 2 tablets (400 mg total) by mouth at bedtime.   indapamide (LOZOL) 2.5 MG tablet Take 1 tablet (2.5 mg total) by mouth daily.   losartan (COZAAR) 100 MG tablet Take 1 tablet (100 mg total) by mouth daily.   meloxicam (MOBIC) 15 MG tablet Take 1 tablet (15 mg total) by mouth daily.   polyethylene glycol powder (GLYCOLAX/MIRALAX) 17 GM/SCOOP powder Take 17 g by mouth 2 (two) times daily as needed.   spironolactone (ALDACTONE) 25 MG tablet Take 1 tablet (25 mg total) by mouth daily.   topiramate (TOPAMAX) 50 MG tablet Take 1 tablet (50 mg total) by mouth at bedtime.   No facility-administered encounter medications on file as of 12/05/2022.   Allergies  Allergen Reactions   Amlodipine Swelling    Leg edema   Tessalon [Benzonatate] Other (See Comments)    Lip swelling   Patient Active Problem List  Diagnosis Date Noted   Esophageal dysphagia 04/15/2020   Mood disorder (HCC) 11/13/2019   Atherosclerosis of aorta (HCC) 07/15/2019   Prediabetes 07/13/2019   Chronic abdominal pain 07/13/2019   Insomnia 12/15/2012   Essential hypertension, benign 04/10/2012   SLE (systemic lupus erythematosus) (HCC) 04/10/2012   Social History   Socioeconomic History   Marital status: Married    Spouse name: Cedric   Number of children: 2   Years of education: Not on file   Highest education level: Some college, no degree  Occupational History    Employer: [employer]  Tobacco Use    Smoking status: Never   Smokeless tobacco: Never  Vaping Use   Vaping status: Never Used  Substance and Sexual Activity   Alcohol use: No   Drug use: No   Sexual activity: Yes    Partners: Male    Birth control/protection: Surgical  Other Topics Concern   Not on file  Social History Narrative   Pt lives with spouse. Some college.    Works 7 days on then 7 days off at place of work       Patient is right-handed. She lives with her husband in a one level home. She occasionally drinks soda. She does not exercise.    Social Determinants of Health   Financial Resource Strain: Not on file  Food Insecurity: No Food Insecurity (07/13/2021)   Hunger Vital Sign    Worried About Running Out of Food in the Last Year: Never true    Ran Out of Food in the Last Year: Never true  Transportation Needs: No Transportation Needs (10/23/2022)   PRAPARE - Administrator, Civil Service (Medical): No    Lack of Transportation (Non-Medical): No  Physical Activity: Not on file  Stress: Stress Concern Present (11/06/2022)   [employer] of Occupational Health - Occupational Stress Questionnaire    Feeling of Stress : To some extent  Social Connections: Not on file  Intimate Partner Violence: Not on file    Veronica [name]'s family history includes Bladder Cancer in her mother; Diabetes in her mother; Hypertension in her brother and father; Kidney disease in her father; Multiple sclerosis in her sister.      Objective:    Vitals:   12/05/22 1034  BP: (!) 140/90    Physical Exam Well-developed well-nourished older AA female  in no acute distress.  Height, ZOXWRU,045  BMI 40.8   HEENT; nontraumatic normocephalic, EOMI, PE R LA, sclera anicteric. Oropharynx;not examined Neck; supple, no JVD Cardiovascular; regular rate and rhythm with S1-S2, no murmur rub or gallop Pulmonary; Clear bilaterally Abdomen; soft, there is some mild tenderness in the left mid quadrant, no guarding or  rebound, nondistended, no palpable mass or hepatosplenomegaly, bowel sounds are active Rectal; not done today Skin; benign exam, no jaundice rash or appreciable lesions Extremities; no clubbing cyanosis or edema skin warm and dry Neuro/Psych; alert and oriented x4, grossly nonfocal mood and affect appropriate        Assessment & Plan:   #48 62 year old female with history of recurrent diverticulitis, who comes in today to establish care here, previously had been followed by Dr. Elnoria Howard. She did have an episode of diverticulitis in the spring 2024, was unable to be seen at that time due to her insurance, and eventually symptoms resolved without an antibiotic.  She has done well in the interim until this morning when she developed some mild left mid abdominal pain.  She is tender  on exam in the left mid quadrant she may have early diverticulitis  #2 constipation-generally managed with MiraLAX 17 g daily and Benefiber 1 dose daily.  We discussed doing a MiraLAX purge with 4-5 doses of MiraLAX in 1 setting, and she may try this tomorrow to see if will help with her current symptoms as she has been feeling somewhat constipated despite the MiraLAX and Benefiber.  #3 solid food dysphagia and intermittent sour brash.  Rule out esophageal ring, peptic stricture, dysmotility. No regular heartburn or indigestion.  #4 colon cancer screening-up-to-date with last colonoscopy 2017 with removal of one 3 mm polyp which was hyperplastic, indicated for 10-year interval follow-up  #5 SLE #6.  Hypertension  Plan; We discussed barium swallow with tablet versus EGD for further evaluation of dysphagia symptoms and she would like to proceed with barium swallow first.  This was scheduled today. Continue MiraLAX 17 g in 8 ounces of water daily and Benefiber daily. MiraLAX purge within the next 24 hours with 4-5 doses of MiraLAX Have sent a prescription for Augmentin 875 1 p.o. twice daily x 10 days, have asked her to  monitor her symptoms over the next 24 hours if pain is persisting tomorrow and/or worsens then she will start the Augmentin intake for at least 7 days.  She knows to call if she has any progression of symptoms after starting the antibiotics and would need imaging at that time.  Patient will be established with Dr. Christell Constant PA-C 12/05/2022   Cc: Westley Chandler, MD

## 2022-12-05 NOTE — Patient Instructions (Signed)
_______________________________________________________  If your blood pressure at your visit was 140/90 or greater, please contact your primary care physician to follow up on this.  If you are age 60 or younger, your body mass index should be between 19-25. Your Body mass index is 40.85 kg/m. If this is out of the aformentioned range listed, please consider follow up with your Primary Care Provider.  ________________________________________________________  The [facility] providers would like to encourage you to use [facility] to communicate with providers for non-urgent requests or questions.  Due to long hold times on the telephone, sending your provider a message by [facility] may be a faster and more efficient way to get a response.  Please allow 48 business hours for a response.  Please remember that this is for non-urgent requests.  _______________________________________________________  Please purchase the following medications over the counter and take as directed:  START: Miralax 17gm in 8 ounces of water daily  START: Benefiber one dose daily  We have sent the following medications to your pharmacy for you to pick up at your convenience:  START: Augmentin 875 mg one tablet twice daily for 10 days  I recommend that you complete a bowel purge (to clean out your bowels).   Please do the following: Purchase a bottle of Miralax over the counter as well as a box of 5 mg dulcolax tablets.  Take 4 dulcolax tablets.  Wait 1 hour.  You will then drink 4 capfuls of Miralax mixed in an adequate amount of water/juice/gatorade (you may choose which of these liquids to drink) over the next 2-3 hours.  You should expect results within 1 to 6 hours after completing the bowel purge.  You have been scheduled for a Barium Esophogram at [facility] (1st floor of the hospital) on 12-21-22 at 10am. Please arrive 30 minutes prior to your appointment for registration. Make certain not to have  anything to eat or drink 3 hours prior to your test. If you need to reschedule for any reason, please contact radiology at [phone] to do so. __________________________________________________________________  A barium swallow is an examination that concentrates on views of the esophagus. This tends to be a double contrast exam (barium and two liquids which, when combined, create a gas to distend the wall of the oesophagus) or single contrast (non-ionic iodine based). The study is usually tailored to your symptoms so a good history is essential. Attention is paid during the study to the form, structure and configuration of the esophagus, looking for functional disorders (such as aspiration, dysphagia, achalasia, motility and reflux)  EXAMINATION You may be asked to change into a gown, depending on the type of swallow being performed. A radiologist and radiographer will perform the procedure. The radiologist will advise you of the type of contrast selected for your procedure and direct you during the exam. You will be asked to stand, sit or lie in several different positions and to hold a small amount of fluid in your mouth before being asked to swallow while the imaging is performed .In some instances you may be asked to swallow barium coated marshmallows to assess the motility of a solid food bolus.  The exam can be recorded as a digital or video fluoroscopy procedure.  POST PROCEDURE It will take 1-2 days for the barium to pass through your system. To facilitate this, it is important, unless otherwise directed, to increase your fluids for the next 24-48hrs and to resume your normal diet.  This test typically takes about  30 minutes to perform. __________________________________________________________________________________  Due to recent changes in healthcare laws, you may see the results of your imaging and laboratory studies on MyChart before your provider has had a chance to review them.  We  understand that in some cases there may be results that are confusing or concerning to you. Not all laboratory results come back in the same time frame and the provider may be waiting for multiple results in order to interpret others.  Please give [place] 48 hours in order for your provider to thoroughly review all the results before contacting the office for clarification of your results.   Thank you for entrusting me with your care and choosing [facility].  Amy Esterwood, PA-C

## 2022-12-06 ENCOUNTER — Other Ambulatory Visit: Payer: 59 | Admitting: Licensed Clinical Social Worker

## 2022-12-06 NOTE — Patient Outreach (Addendum)
Medicaid Managed Care Social Work Note  12/06/2022 Name:  Veronica [name] MRN:  [id number] DOB:  Jun 26, 1960  Veronica [name] is an 62 y.o. year old female who is a primary patient of Veronica Chandler, MD.  The Medicaid Managed Care Coordination team was consulted for assistance with:  Mental Health Counseling and Resources  Veronica [name] was given information about Medicaid Managed Care Coordination team services today. Veronica [name] Patient agreed to services and verbal consent obtained.  Engaged with patient  for by telephone forfollow up visit in response to referral for case management and/or care coordination services.   Assessments/Interventions:  Review of past medical history, allergies, medications, health status, including review of consultants reports, laboratory and other test data, was performed as part of comprehensive evaluation and provision of chronic care management services.  SDOH: (Social Determinant of Health) assessments and interventions performed: SDOH Interventions    Flowsheet Row Patient Outreach Telephone from 12/06/2022 in [place] POPULATION HEALTH DEPARTMENT Patient Outreach Telephone from 11/06/2022 in [place] POPULATION HEALTH DEPARTMENT Patient Outreach Telephone from 10/23/2022 in [place] POPULATION HEALTH DEPARTMENT Office Visit from 01/16/2022 in [facility] Office Visit from 09/01/2021 in [facility] Office Visit from 04/07/2021 in [facility]  SDOH Interventions        Housing Interventions -- -- Intervention Not Indicated -- -- --  Depression Interventions/Treatment  -- -- -- Counseling Currently on Treatment, Counseling Currently on Treatment  Stress Interventions Offered [employer], Provide Counseling  [Still in need of establishing care with MH providers] Offered [employer], Provide Counseling -- -- -- --       Advanced Directives Status:  See  Care Plan for related entries.  Care Plan                 Allergies  Allergen Reactions   Amlodipine Swelling    Leg edema   Tessalon [Benzonatate] Other (See Comments)    Lip swelling    Medications Reviewed Today   Medications were not reviewed in this encounter     Patient Active Problem List   Diagnosis Date Noted   Esophageal dysphagia 04/15/2020   Mood disorder (HCC) 11/13/2019   Atherosclerosis of aorta (HCC) 07/15/2019   Prediabetes 07/13/2019   Chronic abdominal pain 07/13/2019   Insomnia 12/15/2012   Essential hypertension, benign 04/10/2012   SLE (systemic lupus erythematosus) (HCC) 04/10/2012    Conditions to be addressed/monitored per PCP order:   Stress Management  Care Plan : General Social Work (Adult)  Updates made by Veronica Bryant, LCSW since 12/06/2022 12:00 AM     Problem: Emotional Distress      Goal: Emotional Health Supported by connecting for ongoing Therapy   Start Date: 02/02/2021  Expected End Date: 04/29/2021  Recent Progress: On track  Priority: High  Note:   Priority: High  Timeframe:  Short-Range Goal Priority:  High Start Date:   10/23/22           Expected End Date:  ongoing                     Follow Up Date--12/19/22 at 10 am  Current Barriers:  Patient has not been able to connect to counseling/resources provided Disease Management support and education needs related to Mood Instability  CSW Clinical Goal(s):  Patient  will contact providers discussed today and will make decision on where she wishes to gain  therapy  Interventions: Inter-disciplinary care team collaboration (see longitudinal plan of care) Evaluation of current treatment plan related to  self management and patient's adherence to plan as established by provider Establishing healthy boundaries emphasized and healthy self-care education provided Patient was educated on available mental health resources within their area that accept Medicaid and offer  counseling and psychiatry.  Patient educated on the difference between therapy and psychiatry per patient request Email sent to patient today with available mental health resources within her area that accept Medicaid and offer the services that she is interested in. Email included instructions for scheduling at [facility] as well as some crisis support resources and [facility]'s walk in clinic hours. Patient will review resources and make a decision regarding where she wishes to gain MH treatment at. Patient will work on implementing appropriate self-care habits into their daily routine such as: staying positive, writing a gratitude list, drinking water, staying active around the house, taking their medications as prescribed, combating negative thoughts or emotions and staying connected with their family and friends. Positive reinforcement provided for this decision to work on this. Motivational Interviewing employed Depression screen reviewed  PHQ2/ PHQ9 completed or reviewed  Mindfulness or Relaxation training provided Active listening / Reflection utilized  Advance Care and HCPOA education provided Emotional Support Provided Problem Solving /Task Center strategies reviewed Provided psychoeducation for mental health needs  Provided brief CBT  Reviewed mental health medications and discussed importance of compliance:  Quality of sleep assessed & Sleep Hygiene techniques promoted  Participation in counseling encouraged  Verbalization of feelings encouraged  Suicidal Ideation/Homicidal Ideation assessed: Patient denies SI/HI  Review resources, discussed options and provided patient information about  Mental Health Resources Inter-disciplinary care team collaboration (see longitudinal plan of care) 11/06/22 Update- Patient is interested in gaining therapy at [facility] and referral was successfully placed by [facility] LCSW today for therapy. Email was sent to patient and extensive education on the walk in  clinic at [facility] was provided to patient as well. 7/23/24[facility] LCSW completed joint phone call with patient to [facility] and was informed that [facility] denied her referral [facility] LCSW made on 11/06/22 because with their grants, patients must have a history of substance abuse and she does not. 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 Patient wishes to try [facility]. New referral placed with patient's permission on 11/20/22. 12/06/22 Update- Patient has not heard from [facility] regarding referral and has not contacted them herself so a joint call was made to Abran Cantor BH at [employer] and a voice message was left encouraging a return to patient in order to schedule her initial appointment. Patient wrote down contact information for this agency so that she can follow up herself if no return call has been made. Email was sent to patient as well. [facility] LCSW will follow up within two weeks.  Patient Goals/Self-Care Activities: Over the next 90 days Attend scheduled medical appointments Utilize healthy coping skills and supportive resources discussed Contact PCP with any questions or concerns Keep 90 percent of counseling appointments Call your insurance provider for more information about your Enhanced Benefits  Check out counseling resources provided  Begin personal counseling with LCSW, to reduce and manage symptoms of Depression and Stress, until well-established with mental health provider Incorporate into daily practice - relaxation techniques, deep breathing exercises, and mindfulness meditation strategies. Talk about feelings with friends, family members, spiritual advisor,  etc. Contact LCSW directly (#[phone]), if you have questions, need assistance, or if additional social work needs are identified between now and our next scheduled telephone outreach call. Call 988 for mental health hotline/crisis line if needed (24/7  available) Try techniques to reduce symptoms of anxiety/negative thinking (deep breathing, distraction, positive self talk, etc)  - develop a personal safety plan - develop a plan to deal with triggers like holidays, anniversaries - exercise at least 2 to 3 times per week - have a plan for how to handle bad days - journal feelings and what helps to feel better or worse - spend time or talk with others at least 2 to 3 times per week - watch for early signs of feeling worse - begin personal counseling - call and visit an old friend - check out volunteer opportunities - join a support group - laugh; watch a funny movie or comedian - learn and use visualization or guided imagery - perform a random act of kindness - practice relaxation or meditation daily - start or continue a personal journal - practice positive thinking and self-talk -continue with compliance of taking medication  -identify current effective and ineffective coping strategies.  -implement positive self-talk in care to increase self-esteem, confidence and feelings of control.  -consider alternative and complementary therapy approaches such as meditation, mindfulness or yoga.  -journaling, prayer, worship services, meditation or pastoral counseling.  -increase participation in pleasurable group activities such as hobbies, singing, sports or volunteering).  -consider the use of meditative movement therapy such as tai chi, yoga or qigong.  -start a regular daily exercise program based on tolerance, ability and patient choice to support positive thinking and activity    If you are experiencing a Mental Health or Behavioral Health Crisis or need someone to talk to, please call the Suicide and Crisis Lifeline: 988    Patient Goals: Follow up goal        10/12/2022   10:21 AM 08/03/2022   10:19 AM 01/16/2022   10:31 AM 10/20/2021   10:34 AM 09/01/2021   10:20 AM  Depression screen PHQ 2/9  Decreased Interest 2 1 2 1 2   Down,  Depressed, Hopeless 2 1 2 1 2   PHQ - 2 Score 4 2 4 2 4   Altered sleeping 2 1 0 1 2  Tired, decreased energy 2 1 2 2 2   Change in appetite 2 2 2 2 2   Feeling bad or failure about yourself  0 0 0 0 1  Trouble concentrating 2 1 2 1 2   Moving slowly or fidgety/restless 0 0 2 1 2   Suicidal thoughts 0 0 0 0 0  PHQ-9 Score 12 7 12 9 15   Difficult doing work/chores    Not difficult at all    Follow up:  Patient agrees to Care Plan and Follow-up.  Plan: The Managed Medicaid care management team will reach out to the patient again over the next 30 days.  Dickie La, BSW, MSW, [employer] Managed Medicaid LCSW [facility]  [facility] [email or web address] Phone: [phone]

## 2022-12-06 NOTE — Patient Instructions (Signed)
Visit Information  Veronica [name] was given information about Medicaid Managed Care team care coordination services as a part of their [employer] benefit. [name] verbally consentedto engagement with the [facility] Managed Care team.   If you are experiencing a medical emergency, please call 911 or report to your local emergency department or urgent care.   If you have a non-emergency medical problem during routine business hours, please contact your provider's office and ask to speak with a nurse.   For questions related to your [employer] [facility] health plan, please call: [phone]  OR visit the member homepage at: [email or web address]  If you would like to schedule transportation through your [facility] plan, please call the following number at least 2 days in advance of your appointment: [phone]  If you are experiencing a behavioral health crisis, call the [facility] Crisis Line at [phone]). The line is available 24 hours a day, seven days a week.  If you would like help to quit smoking, call 1-800-QUIT-NOW (515-502-9243) OR Espaol: 1-855-Djelo-Ya (3-329-518-8416) o para ms informacin haga clic aqu or Text READY to 606-301 to register via text   Following is a copy of your plan of care:  Care Plan : General Social Work (Adult)  Updates made by Gustavus Bryant, LCSW since 12/06/2022 12:00 AM     Problem: Emotional Distress      Goal: Emotional Health Supported by connecting for ongoing Therapy   Start Date: 02/02/2021  Expected End Date: 04/29/2021  Recent Progress: On track  Priority: High  Note:   Priority: High  Timeframe:  Short-Range Goal Priority:  High Start Date:   10/23/22           Expected End Date:  ongoing                     Follow Up Date--12/19/22 at 10 am  Current Barriers:  Patient has not  been able to connect to counseling/resources provided Disease Management support and education needs related to Mood Instability  CSW Clinical Goal(s):  Patient  will contact providers discussed today and will make decision on where she wishes to gain therapy  Patient Goals/Self-Care Activities: Over the next 90 days Attend scheduled medical appointments Utilize healthy coping skills and supportive resources discussed Contact PCP with any questions or concerns Keep 90 percent of counseling appointments Call your insurance provider for more information about your Enhanced Benefits  Check out counseling resources provided  Begin personal counseling with LCSW, to reduce and manage symptoms of Depression and Stress, until well-established with mental health provider Incorporate into daily practice - relaxation techniques, deep breathing exercises, and mindfulness meditation strategies. Talk about feelings with friends, family members, spiritual advisor, etc. Contact LCSW directly [phone]), if you have questions, need assistance, or if additional social work needs are identified between now and our next scheduled telephone outreach call. Call 988 for mental health hotline/crisis line if needed (24/7 available) Try techniques to reduce symptoms of anxiety/negative thinking (deep breathing, distraction, positive self talk, etc)  - develop a personal safety plan - develop a plan to deal with triggers like holidays, anniversaries - exercise at least 2 to 3 times per week - have a plan for how to handle bad days - journal feelings and what helps to feel better or worse - spend time or talk with others at least 2 to 3 times per week - watch for early signs of  feeling worse - begin personal counseling - call and visit an old friend - check out volunteer opportunities - join a support group - laugh; watch a funny movie or comedian - learn and use visualization or guided imagery - perform a  random act of kindness - practice relaxation or meditation daily - start or continue a personal journal - practice positive thinking and self-talk -continue with compliance of taking medication  -identify current effective and ineffective coping strategies.  -implement positive self-talk in care to increase self-esteem, confidence and feelings of control.  -consider alternative and complementary therapy approaches such as meditation, mindfulness or yoga.  -journaling, prayer, worship services, meditation or pastoral counseling.  -increase participation in pleasurable group activities such as hobbies, singing, sports or volunteering).  -consider the use of meditative movement therapy such as tai chi, yoga or qigong.  -start a regular daily exercise program based on tolerance, ability and patient choice to support positive thinking and activity    If you are experiencing a Mental Health or Behavioral Health Crisis or need someone to talk to, please call the Suicide and Crisis Lifeline: 988    Patient Goals: Follow up goal     24- Hour Availability:    [facility]  [address] [place], [place] [phone] Crisis [phone]   [facility] [phone]  [place] [facility]  [phone]    [facility]  [phone] (after hours)   Therapeutic Alternative/Mobile Crisis   [phone]   [place] National Suicide Hotline  (904) 251-7516 Len Child[place]) [place] [address]   Call [phone] for mental health emergencies   [facility]  [phone]);  [place] and [employer]  [phone]); [place], [place], [place], [place], [place], [place], [place]    [facility] Residents For 24/7 walk-up access to mental health services for [employer] children (4+), adolescents and adults, please visit the [facility] located  at [address] in [place], [place].  *[facility] also provides comprehensive outpatient behavioral health services in a variety of locations around the [place].  Connect With [place] [address] [place], [place] [address] HelpLine: (272)825-3285 or 1-4357983361  Get Directions  Find Help 24/7 By Phone Call our 24-hour HelpLine at 214-117-5834 or (337)765-1767 for immediate assistance for mental health and substance abuse issues.  Walk-In Help [place]: [facility] (Ages 4 and Up) [place]: Emergency Dept., [facility] Additional Resources National Hopeline Network: 1-800-SUICIDE The National Suicide Prevention Lifeline: 1-800-273-TALK     The following coping skill education was provided for stress relief and mental health management: "When your car dies or a deadline looms, how do you respond? Long-term, low-grade or acute stress takes a serious toll on your body and mind, so don't ignore feelings of constant tension. Stress is a natural part of life. However, too much stress can harm our health, especially if it continues every day. This is chronic stress and can put you at risk for heart problems like heart disease and depression. Understand what's happening inside your body and learn simple coping skills to combat the negative impacts of everyday stressors.  Types of Stress There are two types of stress: Emotional - types of emotional stress are relationship problems, pressure at work, financial worries, experiencing discrimination or having a major life change. Physical - Examples of physical stress include being sick having pain, not sleeping  well, recovery from an injury or having an alcohol and drug use disorder. Fight or Flight Sudden or ongoing stress activates your nervous system and floods your bloodstream with adrenaline and cortisol, two hormones that raise blood pressure, increase heart rate and spike blood  sugar. These changes pitch your body into a fight or flight response. That enabled our ancestors to outrun saber-toothed tigers, and it's helpful today for situations like dodging a car accident. But most modern chronic stressors, such as finances or a challenging relationship, keep your body in that heightened state, which hurts your health. Effects of Too Much Stress If constantly under stress, most of [place] will eventually start to function less well.  Multiple studies link chronic stress to a higher risk of heart disease, stroke, depression, weight gain, memory loss and even premature death, so it's important to recognize the warning signals. Talk to your doctor about ways to manage stress if you're experiencing any of these symptoms: Prolonged periods of poor sleep. Regular, severe headaches. Unexplained weight loss or gain. Feelings of isolation, withdrawal or worthlessness. Constant anger and irritability. Loss of interest in activities. Constant worrying or obsessive thinking. Excessive alcohol or drug use. Inability to concentrate.  10 Ways to Cope with Chronic Stress It's key to recognize stressful situations as they occur because it allows you to focus on managing how you react. We all need to know when to close our eyes and take a deep breath when we feel tension rising. Use these tips to prevent or reduce chronic stress. 1. Rebalance Work and Home All work and no play? If you're spending too much time at the office, intentionally put more dates in your calendar to enjoy time for fun, either alone or with others. 2. Get Regular Exercise Moving your body on a regular basis balances the nervous system and increases blood circulation, helping to flush out stress hormones. Even a daily 20-minute walk makes a difference. Any kind of exercise can lower stress and improve your mood ? just pick activities that you enjoy and make it a regular habit. 3. Eat Well and Limit Alcohol and  Stimulants Alcohol, nicotine and caffeine may temporarily relieve stress but have negative health impacts and can make stress worse in the long run. Well-nourished bodies cope better, so start with a good breakfast, add more organic fruits and vegetables for a well-balanced diet, avoid processed foods and sugar, try herbal tea and drink more water. 4. Connect with Supportive People Talking face to face with another person releases hormones that reduce stress. Lean on those good listeners in your life. 5. Carve Out Hobby Time Do you enjoy gardening, reading, listening to music or some other creative pursuit? Engage in activities that bring you pleasure and joy; research shows that reduces stress by almost half and lowers your heart rate, too. 6. Practice Meditation, Stress Reduction or Yoga Relaxation techniques activate a state of restfulness that counterbalances your body's fight-or-flight hormones. Even if this also means a 10-minute break in a long day: listen to music, read, go for a walk in nature, do a hobby, take a bath or spend time with a friend. Also consider doing a mindfulness exercise or try a daily deep breathing or imagery practice. Deep Breathing Slow, calm and deep breathing can help you relax. Try these steps to focus on your breathing and repeat as needed. Find a comfortable position and close your eyes. Exhale and drop your shoulders. Breathe in through your nose; fill your lungs and then  your belly. Think of relaxing your body, quieting your mind and becoming calm and peaceful. Breathe out slowly through your nose, relaxing your belly. Think of releasing tension, pain, worries or distress. Repeat steps three and four until you feel relaxed. Imagery This involves using your mind to excite the senses -- sound, vision, smell, taste and feeling. This may help ease your stress. Begin by getting comfortable and then do some slow breathing. Imagine a place you love being at. It could  be somewhere from your childhood, somewhere you vacationed or just a place in your imagination. Feel how it is to be in the place you're imagining. Pay attention to the sounds, air, colors, and who is there with you. This is a place where you feel cared for and loved. All is well. You are safe. Take in all the smells, sounds, tastes and feelings. As you do, feel your body being nourished and healed. Feel the calm that surrounds you. Breathe in all the good. Breathe out any discomfort or tension. 7. Sleep Enough If you get less than seven to eight hours of sleep, your body won't tolerate stress as well as it could. If stress keeps you up at night, address the cause, and add extra meditation into your day to make up for the lost z's. Try to get seven to nine hours of sleep each night. Make a regular bedtime schedule. Keep your room dark and cool. Try to avoid computers, TV, cell phones and tablets before bed. 8. Bond with Connections You Enjoy Go out for a coffee with a friend, chat with a neighbor, call a family member, visit with a clergy member, or even hang out with your pet. Clinical studies show that spending even a short time with a companion animal can cut anxiety levels almost in half. 9. Take a Vacation Getting away from it all can reset your stress tolerance by increasing your mental and emotional outlook, which makes you a happier, more productive person upon return. Leave your cellphone and laptop at home! 10. See a Counselor, Coach or Therapist If negative thoughts overwhelm your ability to make positive changes, it's time to seek professional help. Make an appointment today--your health and life are worth it."  Dickie La, BSW, MSW, [employer] Managed Medicaid LCSW [facility]  [facility] [email or web address] Phone: [phone]

## 2022-12-08 NOTE — Progress Notes (Signed)
No new SDOH needs since 06/24

## 2022-12-19 ENCOUNTER — Other Ambulatory Visit: Payer: 59 | Admitting: Licensed Clinical Social Worker

## 2022-12-19 NOTE — Patient Outreach (Signed)
Medicaid Managed Care Social Work Note  12/19/2022 Name:  Veronica [name] MRN:  [id number] DOB:  1960/08/22  Veronica [name] is an 62 y.o. year old female who is a primary patient of Westley Chandler, MD.  The Medicaid Managed Care Coordination team was consulted for assistance with:  Mental Health Counseling and Resources  Veronica [name] was given information about Medicaid Managed Care Coordination team services today. Veronica [name] Patient agreed to services and verbal consent obtained.  Engaged with patient  for by telephone forfollow up visit in response to referral for case management and/or care coordination services.   Assessments/Interventions:  Review of past medical history, allergies, medications, health status, including review of consultants reports, laboratory and other test data, was performed as part of comprehensive evaluation and provision of chronic care management services.  SDOH: (Social Determinant of Health) assessments and interventions performed: SDOH Interventions    Flowsheet Row Patient Outreach Telephone from 12/19/2022 in [place] POPULATION HEALTH DEPARTMENT Community Documentation from 12/08/2022 in [facility] Patient Outreach Telephone from 12/06/2022 in [place] POPULATION HEALTH DEPARTMENT Patient Outreach Telephone from 11/06/2022 in [place] POPULATION HEALTH DEPARTMENT Patient Outreach Telephone from 10/23/2022 in [place] POPULATION HEALTH DEPARTMENT Office Visit from 01/16/2022 in [facility]  SDOH Interventions        Food Insecurity Interventions -- Intervention Not Indicated -- -- -- --  Housing Interventions -- -- -- -- Intervention Not Indicated --  Utilities Interventions -- Intervention Not Indicated -- -- -- --  Depression Interventions/Treatment  -- -- -- -- -- Counseling  Stress Interventions Offered [employer], Provide Counseling -- Offered [employer], Provide  Counseling  [Still in need of establishing care with MH providers] [employer], Provide Counseling -- --       Advanced Directives Status:  See Care Plan for related entries.  Care Plan                 Allergies  Allergen Reactions   Amlodipine Swelling    Leg edema   Tessalon [Benzonatate] Other (See Comments)    Lip swelling    Medications Reviewed Today   Medications were not reviewed in this encounter     Patient Active Problem List   Diagnosis Date Noted   Esophageal dysphagia 04/15/2020   Mood disorder (HCC) 11/13/2019   Atherosclerosis of aorta (HCC) 07/15/2019   Prediabetes 07/13/2019   Chronic abdominal pain 07/13/2019   Insomnia 12/15/2012   Essential hypertension, benign 04/10/2012   SLE (systemic lupus erythematosus) (HCC) 04/10/2012    Conditions to be addressed/monitored per PCP order:  Anxiety and Depression  Care Plan : General Social Work (Adult)  Updates made by Veronica Bryant, LCSW since 12/19/2022 12:00 AM     Problem: Emotional Distress      Goal: Emotional Health Supported by connecting for ongoing Therapy   Start Date: 02/02/2021  Expected End Date: 04/29/2021  Recent Progress: On track  Priority: High  Note:   Priority: High  Timeframe:  Short-Range Goal Priority:  High Start Date:   10/23/22           Expected End Date:  ongoing                     Follow Up Date--01/04/23 at 10 am  Current Barriers:  Patient has not been able to connect to counseling/resources provided Disease Management support and education needs related to Mood  Instability  CSW Clinical Goal(s):  Patient  will contact providers discussed today and will make decision on where she wishes to gain therapy  Interventions: Inter-disciplinary care team collaboration (see longitudinal plan of care) Evaluation of current treatment plan related to  self management and patient's adherence to plan as established by provider Establishing healthy  boundaries emphasized and healthy self-care education provided Patient was educated on available mental health resources within their area that accept Medicaid and offer counseling and psychiatry.  Patient educated on the difference between therapy and psychiatry per patient request Email sent to patient today with available mental health resources within her area that accept Medicaid and offer the services that she is interested in. Email included instructions for scheduling at [facility] as well as some crisis support resources and [facility]'s walk in clinic hours. Patient will review resources and make a decision regarding where she wishes to gain MH treatment at. Patient will work on implementing appropriate self-care habits into their daily routine such as: staying positive, writing a gratitude list, drinking water, staying active around the house, taking their medications as prescribed, combating negative thoughts or emotions and staying connected with their family and friends. Positive reinforcement provided for this decision to work on this. Motivational Interviewing employed Depression screen reviewed  PHQ2/ PHQ9 completed or reviewed  Mindfulness or Relaxation training provided Active listening / Reflection utilized  Advance Care and HCPOA education provided Emotional Support Provided Problem Solving /Task Center strategies reviewed Provided psychoeducation for mental health needs  Provided brief CBT  Reviewed mental health medications and discussed importance of compliance:  Quality of sleep assessed & Sleep Hygiene techniques promoted  Participation in counseling encouraged  Verbalization of feelings encouraged  Suicidal Ideation/Homicidal Ideation assessed: Patient denies SI/HI  Review resources, discussed options and provided patient information about  Mental Health Resources Inter-disciplinary care team collaboration (see longitudinal plan of care) 11/06/22 Update- Patient is interested in  gaining therapy at [facility] and referral was successfully placed by [facility] LCSW today for therapy. Email was sent to patient and extensive education on the walk in clinic at [facility] was provided to patient as well. 7/23/24[facility] LCSW completed joint phone call with patient to [facility] and was informed that [facility] denied her referral [facility] LCSW made on 11/06/22 because with their grants, patients must have a history of substance abuse and she does not. 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 Patient wishes to try [facility]. New referral placed with patient's permission on 11/20/22. 12/06/22 Update- Patient has not heard from [facility] regarding referral and has not contacted them herself so a joint call was made to Abran Cantor BH at [employer] and a voice message was left encouraging a return to patient in order to schedule her initial appointment. Patient wrote down contact information for this agency so that she can follow up herself if no return call has been made. Email was sent to patient as well. [facility] LCSW will follow up within two weeks. Update- Patient has not contacted [facility] and they have closed the referral for therapy. [facility] LCSW and patient made joint phone call to [facility] at [facility] and patient is able to be accepted for services and can get scheduled as soon as she completes the electronic intake paperwork. This paperwork was sent to her email by practice today on 12/19/22.  Patient Goals/Self-Care Activities: Over the  next 90 days Attend scheduled medical appointments Utilize healthy coping skills and supportive resources discussed Contact PCP with any questions or concerns Keep 90 percent of counseling appointments Call your insurance provider for more information about your Enhanced Benefits  Check out counseling resources provided  Begin personal counseling with LCSW, to  reduce and manage symptoms of Depression and Stress, until well-established with mental health provider Incorporate into daily practice - relaxation techniques, deep breathing exercises, and mindfulness meditation strategies. Talk about feelings with friends, family members, spiritual advisor, etc. Contact LCSW directly [phone]), if you have questions, need assistance, or if additional social work needs are identified between now and our next scheduled telephone outreach call. Call 988 for mental health hotline/crisis line if needed (24/7 available) Try techniques to reduce symptoms of anxiety/negative thinking (deep breathing, distraction, positive self talk, etc)  - develop a personal safety plan - develop a plan to deal with triggers like holidays, anniversaries - exercise at least 2 to 3 times per week - have a plan for how to handle bad days - journal feelings and what helps to feel better or worse - spend time or talk with others at least 2 to 3 times per week - watch for early signs of feeling worse - begin personal counseling - call and visit an old friend - check out volunteer opportunities - join a support group - laugh; watch a funny movie or comedian - learn and use visualization or guided imagery - perform a random act of kindness - practice relaxation or meditation daily - start or continue a personal journal - practice positive thinking and self-talk -continue with compliance of taking medication  -identify current effective and ineffective coping strategies.  -implement positive self-talk in care to increase self-esteem, confidence and feelings of control.  -consider alternative and complementary therapy approaches such as meditation, mindfulness or yoga.  -journaling, prayer, worship services, meditation or pastoral counseling.  -increase participation in pleasurable group activities such as hobbies, singing, sports or volunteering).  -consider the use of  meditative movement therapy such as tai chi, yoga or qigong.  -start a regular daily exercise program based on tolerance, ability and patient choice to support positive thinking and activity    If you are experiencing a Mental Health or Behavioral Health Crisis or need someone to talk to, please call the Suicide and Crisis Lifeline: 988    Patient Goals: Follow up goal      Follow up:  Patient agrees to Care Plan and Follow-up.  Plan: The Managed Medicaid care management team will reach out to the patient again over the next 30 days.  Dickie La, BSW, MSW, [employer] Managed Medicaid LCSW [facility]  [facility] [email or web address] Phone: [phone]

## 2022-12-19 NOTE — Patient Instructions (Signed)
Visit Information  Veronica [name] was given information about Medicaid Managed Care team care coordination services and verbally consented to engagement with the [facility] Managed Care team.   Following is a copy of your plan of care:  Care Plan : General Social Work (Adult)  Updates made by Veronica Bryant, Veronica [name] since 12/19/2022 12:00 AM     Problem: Emotional Distress      Goal: Emotional Health Supported by connecting for ongoing Therapy   Start Date: 02/02/2021  Expected End Date: 04/29/2021  Recent Progress: On track  Priority: High  Note:   Priority: High  Timeframe:  Short-Range Goal Priority:  High Start Date:   10/23/22           Expected End Date:  ongoing                     Follow Up Date--01/04/23 at 10 am  Current Barriers:  Patient has not been able to connect to counseling/resources provided Disease Management support and education needs related to Mood Instability  CSW Clinical Goal(s):  Patient  will contact providers discussed today and will make decision on where she wishes to gain therapy  Patient Goals/Self-Care Activities: Over the next 90 days Attend scheduled medical appointments Utilize healthy coping skills and supportive resources discussed Contact PCP with any questions or concerns Keep 90 percent of counseling appointments Call your insurance provider for more information about your Enhanced Benefits  Check out counseling resources provided  Begin personal counseling with Veronica [name], to reduce and manage symptoms of Depression and Stress, until well-established with mental health provider Incorporate into daily practice - relaxation techniques, deep breathing exercises, and mindfulness meditation strategies. Talk about feelings with friends, family members, spiritual advisor, etc. Contact Veronica [name] directly [phone]), if you have questions, need assistance, or if additional social work needs are identified between now and our next scheduled telephone  outreach call. Call 988 for mental health hotline/crisis line if needed (24/7 available) Try techniques to reduce symptoms of anxiety/negative thinking (deep breathing, distraction, positive self talk, etc)  - develop a personal safety plan - develop a plan to deal with triggers like holidays, anniversaries - exercise at least 2 to 3 times per week - have a plan for how to handle bad days - journal feelings and what helps to feel better or worse - spend time or talk with others at least 2 to 3 times per week - watch for early signs of feeling worse - begin personal counseling - call and visit an old friend - check out volunteer opportunities - join a support group - laugh; watch a funny movie or comedian - learn and use visualization or guided imagery - perform a random act of kindness - practice relaxation or meditation daily - start or continue a personal journal - practice positive thinking and self-talk -continue with compliance of taking medication  -identify current effective and ineffective coping strategies.  -implement positive self-talk in care to increase self-esteem, confidence and feelings of control.  -consider alternative and complementary therapy approaches such as meditation, mindfulness or yoga.  -journaling, prayer, worship services, meditation or pastoral counseling.  -increase participation in pleasurable group activities such as hobbies, singing, sports or volunteering).  -consider the use of meditative movement therapy such as tai chi, yoga or qigong.  -start a regular daily exercise program based on tolerance, ability and patient choice to support positive thinking and activity    If you are experiencing a Mental Health or Behavioral  Health Crisis or need someone to talk to, please call the Suicide and Crisis Lifeline: 988    Patient Goals: Follow up goal     Veronica [name], BSW, MSW, [employer] Managed Medicaid Veronica [name] [employer]  [facility] [name] Phone: [phone]

## 2022-12-20 ENCOUNTER — Other Ambulatory Visit: Payer: Self-pay

## 2022-12-20 ENCOUNTER — Encounter (INDEPENDENT_AMBULATORY_CARE_PROVIDER_SITE_OTHER): Payer: Self-pay | Admitting: Physician Assistant

## 2022-12-20 ENCOUNTER — Ambulatory Visit (INDEPENDENT_AMBULATORY_CARE_PROVIDER_SITE_OTHER): Payer: 59 | Admitting: Physician Assistant

## 2022-12-20 VITALS — BP 131/87 | HR 61 | Temp 97.7°F | Ht 64.5 in | Wt 235.0 lb

## 2022-12-20 DIAGNOSIS — R7303 Prediabetes: Secondary | ICD-10-CM | POA: Diagnosis not present

## 2022-12-20 DIAGNOSIS — M329 Systemic lupus erythematosus, unspecified: Secondary | ICD-10-CM

## 2022-12-20 DIAGNOSIS — Z6839 Body mass index (BMI) 39.0-39.9, adult: Secondary | ICD-10-CM

## 2022-12-20 DIAGNOSIS — Z0289 Encounter for other administrative examinations: Secondary | ICD-10-CM

## 2022-12-20 DIAGNOSIS — I10 Essential (primary) hypertension: Secondary | ICD-10-CM

## 2022-12-20 NOTE — Progress Notes (Signed)
Office: [phone]  /  Fax: [phone]   Initial Visit  Veronica [name] was seen in clinic today to evaluate for obesity. She is interested in losing weight to improve overall health and reduce the risk of weight related complications. She presents today to review program treatment options, initial physical assessment, and evaluation.     She was referred by: PCP  When asked what else they would like to accomplish? She states: Adopt healthier eating patterns, Improve energy levels and physical activity, Improve existing medical conditions, Reduce number of medications, Improve quality of life, Improve appearance, Improve self-confidence, and Lose a target amount of weight : 60 lbs  Weight history: Reports at highest weight now and concerned by snacking habits and decreased activity level.   When asked how has your weight affected you? She states: Contributed to medical problems, Contributed to orthopedic problems or mobility issues, Having fatigue, and Has affected mood   Some associated conditions: Hypertension, Arthritis:Lupus/RA, Hyperlipidemia, Prediabetes, GERD, Overactive bladder, and Connective tissue disease: Lupus /RA  Contributing factors: Family history, Nutritional, Medications, Stress, Reduced physical activity, Eating patterns, Mental health problems, and Menopause  Weight promoting medications identified: Beta-blockers and Steroids  Current nutrition plan: None  Current level of physical activity: Limited due to chronic pain or orthopedic problems and Walking  Current or previous pharmacotherapy: None  Response to medication: Never tried medications   Past medical history includes:   Past Medical History:  Diagnosis Date   Daily headache    Diverticulitis    Fibromyalgia    G6PD deficiency    outside labs--low level, no longer on HCQ   Glucose 6 phosphatase deficiency (HCC)    History of cholelithiasis    History of colon polyps    Hypertension     Prediabetes    Rheumatoid arthritis (HCC)    SLE (systemic lupus erythematosus) (HCC) 1998     Objective:   BP 131/87   Pulse 61   Temp 97.7 F (36.5 C)   Ht 5' 4.5" (1.638 m)   Wt 235 lb (106.6 kg)   SpO2 100%   BMI 39.71 kg/m  She was weighed on the bioimpedance scale: Body mass index is 39.71 kg/m.  Peak Weight:235 lbs , Body Fat%:46.7%, Visceral Fat Rating:15, Weight trend over the last 12 months: Increasing  General:  Alert, oriented and cooperative. Patient is in no acute distress.  Respiratory: Normal respiratory effort, no problems with respiration noted   Gait: able to ambulate independently  Mental Status: Normal mood and affect. Normal behavior. Normal judgment and thought content.   DIAGNOSTIC DATA REVIEWED:  BMET    Component Value Date/Time   NA 139 10/12/2022 1140   K 3.6 10/12/2022 1140   CL 102 10/12/2022 1140   CO2 24 10/12/2022 1140   GLUCOSE 100 (H) 10/12/2022 1140   GLUCOSE 166 (H) 12/05/2019 2123   BUN 15 10/12/2022 1140   CREATININE 0.97 10/12/2022 1140   CREATININE 0.78 06/13/2016 1436   CALCIUM 9.7 10/12/2022 1140   GFRNONAA 64 04/15/2020 1121   GFRNONAA 87 06/13/2016 1436   GFRAA 74 04/15/2020 1121   GFRAA >89 06/13/2016 1436   Lab Results  Component Value Date   HGBA1C 5.7 08/03/2022   HGBA1C 6.2 (H) 02/11/2009   Lab Results  Component Value Date   INSULIN 23.1 08/03/2022   CBC    Component Value Date/Time   WBC 4.7 08/03/2022 1210   WBC 10.8 (H) 12/05/2019 2123   RBC 4.77 08/03/2022 1210  RBC 4.37 12/05/2019 2123   HGB 12.5 08/03/2022 1210   HCT 40.0 08/03/2022 1210   PLT 350 08/03/2022 1210   MCV 84 08/03/2022 1210   MCH 26.2 (L) 08/03/2022 1210   MCH 27.7 12/05/2019 2123   MCHC 31.3 (L) 08/03/2022 1210   MCHC 30.9 12/05/2019 2123   RDW 12.4 08/03/2022 1210   Iron/TIBC/Ferritin/ %Sat    Component Value Date/Time   IRON 114 05/18/2008 0405   TIBC 300 05/18/2008 0405   FERRITIN 19 05/18/2008 0405   IRONPCTSAT 38  05/18/2008 0405   Lipid Panel     Component Value Date/Time   CHOL 122 04/09/2022 1106   TRIG 111 04/09/2022 1106   HDL 40 04/09/2022 1106   CHOLHDL 3.1 04/09/2022 1106   CHOLHDL 4.4 06/13/2016 1436   VLDL 30 06/13/2016 1436   LDLCALC 62 04/09/2022 1106   Hepatic Function Panel     Component Value Date/Time   PROT 7.6 08/03/2022 1210   ALBUMIN 4.1 08/03/2022 1210   AST 20 08/03/2022 1210   ALT 15 08/03/2022 1210   ALKPHOS 61 08/03/2022 1210   BILITOT 0.3 08/03/2022 1210   BILIDIR 3.7 (H) 07/28/2012 1200   IBILI 1.7 (H) 07/28/2012 1200      Component Value Date/Time   TSH 1.300 08/03/2022 1210   TSH 0.860 03/03/2021 1137     Assessment and Plan:   Prediabetes  Systemic lupus erythematosus, unspecified SLE type, unspecified organ involvement status (HCC)  Essential hypertension, benign  Class 2 severe obesity due to excess calories with serious comorbidity and body mass index (BMI) of 39.0 to 39.9 in adult [facility])        Obesity Treatment / Action Plan:  Patient will work on garnering support from family and friends to begin weight loss journey. Will work on eliminating or reducing the presence of highly palatable, calorie dense foods in the home. Will complete provided nutritional and psychosocial assessment questionnaire before the next appointment. Will be scheduled for indirect calorimetry to determine resting energy expenditure in a fasting state.  This will allow [place] to create a reduced calorie, high-protein meal plan to promote loss of fat mass while preserving muscle mass. Will think about ideas on how to incorporate physical activity into their daily routine. Will work on reducing intake of added sugars, simple sugars and processed carbs. Will reduce liquid calories and sugary drinks from diet. Will reduce the frequency of eating out and making healthier choices by advanced menu planning. Counseled on the health benefits of losing 5%-15% of total body  weight. Was counseled on nutritional approaches to weight loss and benefits of reducing processed foods and consuming plant-based foods and high quality protein as part of nutritional weight management. Was counseled on pharmacotherapy and role as an adjunct in weight management.   Obesity Education Performed Today:  She was weighed on the bioimpedance scale and results were discussed and documented in the synopsis.  We discussed obesity as a disease and the importance of a more detailed evaluation of all the factors contributing to the disease.  We discussed the importance of long term lifestyle changes which include nutrition, exercise and behavioral modifications as well as the importance of customizing this to her specific health and social needs.  We discussed the benefits of reaching a healthier weight to alleviate the symptoms of existing conditions and reduce the risks of the biomechanical, metabolic and psychological effects of obesity.  Veronica [name] appears to be in the action stage of change  and states they are ready to start intensive lifestyle modifications and behavioral modifications.  30 minutes was spent today on this visit including the above counseling, pre-visit chart review, and post-visit documentation.  Reviewed by clinician on day of visit: allergies, medications, problem list, medical history, surgical history, family history, social history, and previous encounter notes pertinent to obesity diagnosis.   Trameka Dorough,PA-C

## 2022-12-20 NOTE — Progress Notes (Signed)
    SUBJECTIVE:   CHIEF COMPLAINT: check up HPI:   Veronica [name] is a 62 y.o.  with history notable for mood changes and obesity presenting for follow up.   She reports overall doing well. Her birthday is upcoming this month. She has counseling scheduled. She recently saw Neurology and GI. Esophogram was unremarkable.   She reports R sided carpal tunnel is worse. She is RHD. She is wearing a brace with minimal improvement. Has numbness and tingling in digits 1-3 on R. Had prior carpal tunnel release on L. This worked well. Had a steroid injection without much relief 2 years ago.   She has therapy upcoming. Mood is okay.   She reports some dizziness upon standing. Drinks sweat tea and some water throughout day. Avoids tea at night due to bladder irritation.   PERTINENT  PMH / PSH/Family/Social History : HTN  OBJECTIVE:   BP 124/68   Pulse 66   Ht 5' 4.5" (1.638 m)   Wt 240 lb (108.9 kg)   SpO2 100%   BMI 40.56 kg/m   Today's weight:  Last Weight  Most recent update: 12/24/2022  9:55 AM    Weight  108.9 kg (240 lb)            Review of prior weights: [employer]   12/24/22 0955  Weight: 240 lb (108.9 kg)    HEENT: EOMI. Sclera without injection or icterus. MMM. External auditory canal examined and WNL. TM normal appearance, no erythema or bulging. Neck: Supple.  Cardiac: Regular rate and rhythm. Normal S1/S2. No murmurs, rubs, or gallops appreciated. Lungs: Clear bilaterally to ascultation.  Unable to elicit positive Phalens or Tinels on R Does + shake hands (flick sign) at night   ASSESSMENT/PLAN:   Mood disorder (HCC) Planning to attend therapy    Esophageal dysphagia EGD with reflux--discussed limiting mobic as this may upset reflux   Suspected CTS on R No neck pain suggestive of C spine disease Topical NSAIDs Continue brace Referral back to hand surgery    Dizziness upon standing Discussed limiting sweat tea--also helps with weight Increasing  water discussed Likely due to antihypertensives--BP at goal, will monitor   She will follow up in 4 months with me Nurse visit for flu/covid shots      [name], MD  Family Medicine Teaching Service  [facility]

## 2022-12-21 ENCOUNTER — Other Ambulatory Visit: Payer: Self-pay

## 2022-12-21 ENCOUNTER — Ambulatory Visit (HOSPITAL_COMMUNITY)
Admission: RE | Admit: 2022-12-21 | Discharge: 2022-12-21 | Disposition: A | Discharge status: Home / Self Care | Payer: 59 | Source: Ambulatory Visit | Attending: Physician Assistant | Admitting: Physician Assistant

## 2022-12-21 DIAGNOSIS — R1310 Dysphagia, unspecified: Secondary | ICD-10-CM | POA: Insufficient documentation

## 2022-12-21 MED ORDER — PANTOPRAZOLE SODIUM 40 MG PO TBEC: 40.0000 mg | DELAYED_RELEASE_TABLET | Freq: Every day | ORAL | 3 refills | Status: DC

## 2022-12-24 ENCOUNTER — Ambulatory Visit: Payer: 59 | Admitting: Family Medicine

## 2022-12-24 ENCOUNTER — Encounter: Payer: Self-pay | Admitting: Family Medicine

## 2022-12-24 VITALS — BP 124/68 | HR 66 | Ht 64.5 in | Wt 240.0 lb

## 2022-12-24 DIAGNOSIS — R1319 Other dysphagia: Secondary | ICD-10-CM

## 2022-12-24 DIAGNOSIS — I10 Essential (primary) hypertension: Secondary | ICD-10-CM

## 2022-12-24 DIAGNOSIS — G5601 Carpal tunnel syndrome, right upper limb: Secondary | ICD-10-CM

## 2022-12-24 DIAGNOSIS — F39 Unspecified mood [affective] disorder: Secondary | ICD-10-CM | POA: Diagnosis not present

## 2022-12-24 MED ORDER — DICLOFENAC SODIUM 2 % EX SOLN
2.0000 | Freq: Two times a day (BID) | CUTANEOUS | 0 refills | Status: DC | PRN
Start: 2022-12-24 — End: 2023-02-28

## 2022-12-24 NOTE — Patient Instructions (Addendum)
It was wonderful to see you today.  Please bring ALL of your medications with you to every visit.   Today we talked about:  Reduce sweet tea Increase water  Please continue your home medications  Keep a log on your phone of the body zaps  I sent in a referral for your hand I also sent in a medication to use up to 2 times per day  Please follow up in 4 months   Thank you for choosing [facility].   Please call [phone] with any questions about today's appointment.  Please be sure to schedule follow up at the front  desk before you leave today.   [name], MD  Family Medicine

## 2022-12-24 NOTE — Assessment & Plan Note (Signed)
Planning to attend therapy

## 2022-12-24 NOTE — Assessment & Plan Note (Signed)
EGD with reflux--discussed limiting mobic as this may upset reflux

## 2022-12-26 NOTE — Progress Notes (Signed)
 The patient attended 12/08/22 screening event where her screening results were wnl. At the event the patient noted having a PCP and insurance. The SDOH section was labeled N/A. Per chart review the Pt has seen PCP on 12/24/22. Chart review also indicates eight upcoming future appts with different Doctors. No additional Health equity team support indicated at this time.

## 2022-12-27 ENCOUNTER — Telehealth: Payer: Self-pay

## 2022-12-27 NOTE — Telephone Encounter (Signed)
Pharmacy Patient Advocate Encounter   Received notification from CoverMyMeds that prior authorization for DICLOFENAC SODIUM 2% SOLUTION is required/requested.   Insurance verification completed.   The patient is insured through [facility] .   Per test claim: PA required; PA submitted to [facility] via CoverMyMeds Key/confirmation #/EOC [id number]. Status is pending

## 2022-12-29 NOTE — Progress Notes (Signed)
Office Visit Note  Patient: Veronica [name]             Date of Birth: [birth year]           MRN: [id number]             PCP: Westley Chandler, MD Referring: Westley Chandler, MD Visit Date: 01/11/2023 Occupation: @GUAROCC @  Subjective:  Pain in joints and muscles  History of Present Illness: Veronica [name] is a 62 y.o. female seen in consultation per request of her PCP.Patient was diagnosed with systemic lupus erythematosus in 1998 by Dr. Kellie Simmering.  She presented with malar rash, arthritis, pleural effusion, positive ANA, positive double-stranded DNA, positive RNP, positive SSA, positive RF.  She was treated with prednisone for about 3 years and also was on Xanax.  Patient states then she stopped all the treatment.  She started seeing [name] in 2000.  She had labs which were consistent with lupus and she was placed on hydroxychloroquine.  She is intermittently taking hydroxychloroquine since 2000.  She had repeat episode of pleurisy in 2009 and was treated with prednisone.  She was under care of [name] for many years and then switched her care to Mr. Fredderick Phenix due to change in her insurance and that back to [name] at her last visit with [name] was on July 19, 2022.  She was also given methotrexate by Mr. Fredderick Phenix.  Patient experienced abdominal pain, chills and weakness on methotrexate which was discontinued.  Patient states she has been doing well on hydroxychloroquine which she has been taking on a regular basis.  She has intermittent discomfort in her knee joints.  She gives history of fatigue, dry mouth, pain in her knee joints.  She denies any history of malar rash, Raynaud's phenomenon, photosensitivity, lymphadenopathy, inflammatory arthritis, chest pain or palpitations.  She also has some generalized pain and discomfort.  She states she was diagnosed with fibromyalgia syndrome by her [name] in the past.  She is switching care due to change in her insurance.  There is no family history of  autoimmune disease.  She is gravida 2, para 2.  There is no history of preeclampsia or DVTs.  Activities of Daily Living:  Patient reports morning stiffness for 5-10 minutes.   Patient Denies nocturnal pain.  Difficulty dressing/grooming: Denies Difficulty climbing stairs: Reports Difficulty getting out of chair: Reports Difficulty using hands for taps, buttons, cutlery, and/or writing: Reports  Review of Systems  Constitutional:  Positive for fatigue.  HENT:  Positive for mouth dryness. Negative for mouth sores.   Eyes:  Negative for dryness.  Respiratory:  Negative for shortness of breath.   Cardiovascular:  Negative for chest pain and palpitations.  Gastrointestinal:  Positive for constipation. Negative for blood in stool and diarrhea.  Endocrine: Positive for increased urination.  Genitourinary:  Negative for painful urination and involuntary urination.  Musculoskeletal:  Positive for joint pain, gait problem, joint pain, myalgias, morning stiffness, muscle tenderness and myalgias. Negative for joint swelling and muscle weakness.  Skin:  Negative for color change, rash, hair loss and sensitivity to sunlight.  Allergic/Immunologic: Positive for susceptible to infections.  Neurological:  Positive for dizziness, light-headedness, numbness and headaches.  Hematological:  Negative for swollen glands.  Psychiatric/Behavioral:  Positive for depressed mood and sleep disturbance. The patient is nervous/anxious.     PMFS History:  Patient Active Problem List   Diagnosis Date Noted   Major depressive disorder, recurrent episode, moderate (HCC) 01/08/2023  Class 2 severe obesity due to excess calories with serious comorbidity and body mass index (BMI) of 39.0 to 39.9 in adult [facility]) 12/20/2022   Esophageal dysphagia 04/15/2020   Mood disorder (HCC) 11/13/2019   Atherosclerosis of aorta (HCC) 07/15/2019   Prediabetes 07/13/2019   Chronic abdominal pain 07/13/2019   Generalized anxiety  disorder 05/15/2019   Insomnia 12/15/2012   Essential hypertension, benign 04/10/2012   SLE (systemic lupus erythematosus) (HCC) 04/10/2012    Past Medical History:  Diagnosis Date   Daily headache    Diverticulitis    Fibromyalgia    G6PD deficiency    outside labs--low level, no longer on HCQ   Glucose 6 phosphatase deficiency (HCC)    History of cholelithiasis    History of colon polyps    Hypertension    Prediabetes    Rheumatoid arthritis (HCC)    SLE (systemic lupus erythematosus) (HCC) 1998    Family History  Problem Relation Age of Onset   Diabetes Mother    Bladder Cancer Mother    Hypertension Father    Kidney disease Father    Diabetes Sister    Multiple sclerosis Sister    Diabetes Brother    Hypertension Brother    Healthy Son    Healthy Son    Past Surgical History:  Procedure Laterality Date   ABDOMINAL HYSTERECTOMY  09/2008   CESAREAN SECTION  1992, 1995   CHOLECYSTECTOMY N/A 07/18/2012   Procedure: LAPAROSCOPIC CHOLECYSTECTOMY WITH INTRAOPERATIVE CHOLANGIOGRAM;  Surgeon: Atilano Ina, MD;  Location: [facility] OR;  Service: General;  Laterality: N/A;   COLONOSCOPY     ERCP N/A 07/29/2012   Procedure: ENDOSCOPIC RETROGRADE CHOLANGIOPANCREATOGRAPHY (ERCP);  Surgeon: Theda Belfast, MD;  Location: [facility];  Service: Endoscopy;  Laterality: N/A;  Dr. Elnoria Howard said he would start this PT arond 1330( AW)   INCISION AND DRAINAGE ABSCESS Right 12/02/2015   Procedure: INCISION AND DRAINAGE ABSCESS;  Surgeon: Jimmye Norman, MD;  Location: [facility] OR;  Service: General;  Laterality: Right;  Right axillary abscess   TUBAL LIGATION  1995   Social History   Social History Narrative   Pt lives with spouse. Some college.    Works 7 days on then 7 days off at place of work       Patient is right-handed. She lives with her husband in a one level home. She occasionally drinks soda. She does not exercise.    Immunization History  Administered Date(s) Administered   COVID-19,  mRNA, vaccine(Comirnaty)12 years and older 04/09/2022   Influenza,inj,Quad PF,6+ Mos 01/09/2013, 02/21/2015, 02/07/2016, 01/09/2017, 02/18/2018, 01/25/2020, 01/06/2021, 01/16/2022   PFIZER(Purple Top)SARS-COV-2 Vaccination 05/11/2019, 06/05/2019, 02/13/2020   PNEUMOCOCCAL CONJUGATE-20 01/30/2021   PPD Test 09/02/2012, 11/03/2013, 10/08/2014, 11/28/2015, 02/25/2017, 02/18/2018, 01/25/2020, 01/30/2021, 04/09/2022   Pfizer Covid-19 Vaccine Bivalent Booster 61yrs & up 05/22/2021   Tdap 09/02/2012     Objective: Vital Signs: BP 118/86 (BP Location: Right Arm, Patient Position: Sitting, Cuff Size: Large)   Pulse 84   Resp 17   Ht 5\' 4"  (1.626 m)   Wt 241 lb 6.4 oz (109.5 kg)   BMI 41.44 kg/m    Physical Exam Vitals and nursing note reviewed.  Constitutional:      Appearance: She is well-developed.  HENT:     Head: Normocephalic and atraumatic.  Eyes:     Conjunctiva/sclera: Conjunctivae normal.  Cardiovascular:     Rate and Rhythm: Normal rate and regular rhythm.     Heart sounds: Normal heart sounds.  Pulmonary:     Effort: Pulmonary effort is normal.     Breath sounds: Normal breath sounds.  Abdominal:     General: Bowel sounds are normal.     Palpations: Abdomen is soft.  Musculoskeletal:     Cervical back: Normal range of motion.  Lymphadenopathy:     Cervical: No cervical adenopathy.  Skin:    General: Skin is warm and dry.     Capillary Refill: Capillary refill takes less than 2 seconds.  Neurological:     Mental Status: She is alert and oriented to person, place, and time.  Psychiatric:        Behavior: Behavior normal.      Musculoskeletal Exam: Cervical, thoracic and lumbar spine were in good range of motion.  Shoulder joints, elbow joints, wrist joints, MCPs PIPs and DIPs were in good range of motion with no synovitis.  Hip joints and knee joints were in good range of motion.  She has warmth and swelling on palpation of her right knee joint.  There was no  tenderness over ankles or MTPs.  CDAI Exam: CDAI Score: -- Patient Global: --; Provider Global: -- Swollen: --; Tender: -- Joint Exam 01/11/2023   No joint exam has been documented for this visit   There is currently no information documented on the homunculus. Go to the Rheumatology activity and complete the homunculus joint exam.  Investigation: No additional findings.  Imaging: DG ESOPHAGUS W DOUBLE CM (HD)  Result Date: 12/21/2022 CLINICAL DATA:  solid food dysphagia, EXAM: ESOPHOGRAM/BARIUM SWALLOW TECHNIQUE: Combined double contrast and single contrast examination performed using effervescent crystals, thick barium liquid, and thin barium liquid. FLUOROSCOPY: Radiation Exposure Index (as provided by the fluoroscopic device): 55.4 mGy Kerma COMPARISON:  None Available. FINDINGS: Scout: Visualized bilateral lungs are clear. Left lateral costophrenic angle is clear. No free air under the domes of diaphragm. There are metallic staples in the right upper quadrant from prior cholecystectomy. Double contrast views of the esophageal mucosa are normal-appearing. Spontaneous non propulsive tertiary contractions noted in the lower thoracic esophagus on swallowing. However, otherwise normal primary peristaltic stripping waves are noted. There is no stricture, ring or web. There is no hiatal hernia. Spontaneous reflux was noted on several occasions reaching up to the midthoracic esophagus. No reflux with the provocative "water siphon test", coughing or Valsalva maneuver. IMPRESSION: 1. Spontaneous gastroesophageal reflux. 2. Mild lower esophageal dysmotility. Electronically Signed   By: Jules Schick M.D.   On: 12/21/2022 10:59    Recent Labs: Lab Results  Component Value Date   WBC 4.7 08/03/2022   HGB 12.5 08/03/2022   PLT 350 08/03/2022   NA 139 10/12/2022   K 3.6 10/12/2022   CL 102 10/12/2022   CO2 24 10/12/2022   GLUCOSE 100 (H) 10/12/2022   BUN 15 10/12/2022   CREATININE 0.97  10/12/2022   BILITOT 0.3 08/03/2022   ALKPHOS 61 08/03/2022   AST 20 08/03/2022   ALT 15 08/03/2022   PROT 7.6 08/03/2022   ALBUMIN 4.1 08/03/2022   CALCIUM 9.7 10/12/2022   GFRAA 74 04/15/2020      Speciality Comments: Plaquenil since 2000, methotrexate-GI side effects and fatigue  Procedures:  No procedures performed Allergies: Amlodipine and Tessalon [benzonatate]   Assessment / Plan:     Visit Diagnoses: Other systemic lupus erythematosus with other organ involvement (HCC) - Positive ANA, positive dsDNA, positive RNP, positive SSA, positive RF, history of arthritis, malar rash, pleural effusion-patient was diagnosed with systemic lupus in 1998  by Dr. Kellie Simmering.  She was on prednisone for about 3 years.  She came off the treatment after 3 years.  She was evaluated by [name] in 2000 and was started on hydroxychloroquine.  She had another episode of pleuritis in 2008.  She was tried on methotrexate by Mr. Fredderick Phenix but had side effects and was discontinued.  She had been on Plaquenil by [name] for many years now.  Patient is coming to me due to change in her insurance plan.  Patient states that she ran out of Plaquenil a few months ago and had increased pain and discomfort in multiple joints.  She is doing better since she was placed back on hydroxychloroquine by her PCP.  She has been having increased pain and discomfort in her right knee joint.  None of the other joints are painful.  She has intermittent discomfort in her hands and her feet.She gives history of fatigue, dry mouth, and arthralgias.  I will obtain blood labs today.  Plan: Sedimentation rate, Uric acid, ANA, Anti-scleroderma antibody, RNP Antibody, Anti-Smith antibody, Sjogrens syndrome-A extractable nuclear antibody, Sjogrens syndrome-B extractable nuclear antibody, Anti-DNA antibody, double-stranded, Beta-2 glycoprotein antibodies, Cardiolipin antibodies, IgG, IgM, IgA, Lupus Anticoagulant Eval w/Reflex.  Prescription refill for  Plaquenil was given per patient's request.  Side effects of Plaquenil were discussed at length.  A handout was given and consent was taken.  Patient was counseled on the purpose, proper use, and adverse effects of hydroxychloroquine including nausea/diarrhea, skin rash, headaches, and sun sensitivity.  Advised patient to wear sunscreen once starting hydroxychloroquine to reduce risk of rash associated with sun sensitivity.  Discussed importance of annual eye exams while on hydroxychloroquine to monitor to ocular toxicity and discussed importance of frequent laboratory monitoring.  Provided patient with eye exam form for baseline ophthalmologic exam.  Provided patient with educational materials on hydroxychloroquine and answered all questions.  Patient consented to hydroxychloroquine. Will upload consent in the media tab.    Reviewed risk for QTC prolongation when used in combination with other QTc prolonging agents (including but not limited to antiarrhythmics, macrolide antibiotics, flouroquinolone antibiotics, haloperidol, quetiapine, olanzapine, risperidone, droperidol, ziprasidone, amitriptyline, citalopram, ondansetron, migraine triptans, and methadone).    High risk medication use - Plaquenil 200 mg p.o. twice daily since 2000.  Patient states that she was getting eye exams through Dr. Harlon Flor but she has not seen them in 2 years.  Increased risk of ocular toxicity with hydroxychloroquine was emphasized.  She would like to go to another practice which is closer to [facility].  Plan: CBC with Differential/Platelet, COMPLETE METABOLIC PANEL WITH GFR.  She was advised to get labs in 3 months and then every 5 months.  Information on immunization was also placed in the AVS.  Pain in both hands -she has intermittent discomfort in her hands.  No synovitis was noted.  Plan: Rheumatoid factor, Cyclic citrul peptide antibody, IgG, XR Hand 2 View Right, XR Hand 2 View Left.  X-rays showed  osteoarthritic changes.  Chronic pain of both knees -she complains of pain and discomfort in the knee joints.  Warmth and swelling was noted in her right knee joint.  Plan: XR KNEE 3 VIEW RIGHT, XR KNEE 3 VIEW LEFT.  X-ray showed moderate osteoarthritis and chondromalacia patella.  Pain in both feet -she gives history of intermittent discomfort in her feet.  Plan: XR Foot 2 Views Right, XR Foot 2 Views Left.  X-rays were suggestive of osteoarthritis.  Other fatigue -she gives history  of chronic fatigue.  Plan: CK, TSH  Vitamin D deficiency -she had vitamin D deficiency in the past.  She has been experiencing increased fatigue.  Plan: VITAMIN D 25 Hydroxy (Vit-D Deficiency, Fractures)  G6PD deficiency  Carpal tunnel syndrome, right upper limb - Status post left carpal tunnel release  Fibromyalgia-patient continues to have generalized pain and discomfort in all of her muscles and joints.  Patient states that she was diagnosed with fibromyalgia syndrome by [name] in the past.  Need for regular exercise and stretching was discussed.  She takes meloxicam and uses topical Pennsaid.  Other medical problems are listed as follows:  Essential hypertension, benign-blood pressure was normal at 118/86.  She is on Cozaar and Aldactone.  Dyslipidemia  Prediabetes  Esophageal dysphagia  Atherosclerosis of aorta (HCC)  Mood disorder (HCC)  Other insomnia  Class 2 severe obesity due to excess calories with serious comorbidity and body mass index (BMI) of 39.0 to 39.9 in adult [facility])  Family history of multiple sclerosis-Sister    Orders: Orders Placed This Encounter  Procedures   XR Hand 2 View Right   XR Hand 2 View Left   XR KNEE 3 VIEW RIGHT   XR KNEE 3 VIEW LEFT   XR Foot 2 Views Right   XR Foot 2 Views Left   CBC with Differential/Platelet   COMPLETE METABOLIC PANEL WITH GFR   CK   TSH   Sedimentation rate   VITAMIN D 25 Hydroxy (Vit-D Deficiency, Fractures)   Uric acid    Rheumatoid factor   Cyclic citrul peptide antibody, IgG   ANA   Anti-scleroderma antibody   RNP Antibody   Anti-Smith antibody   Sjogrens syndrome-A extractable nuclear antibody   Sjogrens syndrome-B extractable nuclear antibody   Anti-DNA antibody, double-stranded   Beta-2 glycoprotein antibodies   Cardiolipin antibodies, IgG, IgM, IgA   Lupus Anticoagulant Eval w/Reflex   Ambulatory referral to Ophthalmology   Meds ordered this encounter  Medications   hydroxychloroquine (PLAQUENIL) 200 MG tablet    Sig: Take 1 tablet (200 mg total) by mouth 2 (two) times daily.    Dispense:  180 tablet    Refill:  0    Face-to-face time spent patient was over 60 minutes.  More than 50% time was spent in counseling and coordination of care.  Follow-Up Instructions: Return for Systemic lupus.   Pollyann Savoy, MD  Note - This record has been created using Animal nutritionist.  Chart creation errors have been sought, but may not always  have been located. Such creation errors do not reflect on  the standard of medical care.

## 2023-01-01 NOTE — Telephone Encounter (Signed)
Pharmacy Patient Advocate Encounter  Received notification from  [facility]  that Prior Authorization for DICLOFENAC SODIUM 2% SOLUTION has been DENIED.  Full denial letter will be uploaded to the media tab. See denial reason below.     PA #/Case ID/Reference #: [id number]

## 2023-01-04 ENCOUNTER — Other Ambulatory Visit: Payer: 59 | Admitting: Licensed Clinical Social Worker

## 2023-01-04 NOTE — Patient Instructions (Signed)
Visit Information  Ms. Barman was given information about Medicaid Managed Care team care coordination services as a part of their [employer] Medicaid benefit. [name] verbally consentedto engagement with the [facility] Managed Care team.   If you are experiencing a medical emergency, please call 911 or report to your local emergency department or urgent care.   If you have a non-emergency medical problem during routine business hours, please contact your provider's office and ask to speak with a nurse.   For questions related to your [facility] [facility] health plan, please call: [phone]  OR visit the member homepage at: [email or web address]  If you would like to schedule transportation through your [facility] plan, please call the following number at least 2 days in advance of your appointment: [phone]  If you are experiencing a behavioral health crisis, call the [facility] [facility] Crisis Line at [phone]). The line is available 24 hours a day, seven days a week.  If you would like help to quit smoking, call 1-800-QUIT-NOW (930-387-0249) OR Espaol: 1-855-Djelo-Ya (3-329-518-8416) o para ms informacin haga clic aqu or Text READY to 606-301 to register via text  Following is a copy of your plan of care:  Care Plan : General Social Work (Adult)  Updates made by Gustavus Bryant, LCSW since 01/04/2023 12:00 AM     Problem: Emotional Distress      Goal: Emotional Health Supported by connecting for ongoing Therapy   Start Date: 02/02/2021  Expected End Date: 04/29/2021  Recent Progress: On track  Priority: High  Note:   Priority: High  Timeframe:  Short-Range Goal Priority:  High Start Date:   10/23/22           Expected End Date:  ongoing                     Follow Up Date--01/18/23 at 11 am  Current Barriers:  Patient has not  been able to connect to counseling/resources provided Disease Management support and education needs related to Mood Instability  CSW Clinical Goal(s):  Patient  will contact providers discussed today and will make decision on where she wishes to gain therapy  Patient Goals/Self-Care Activities: Over the next 90 days Attend scheduled medical appointments Utilize healthy coping skills and supportive resources discussed Contact PCP with any questions or concerns Keep 90 percent of counseling appointments Call your insurance provider for more information about your Enhanced Benefits  Check out counseling resources provided  Begin personal counseling with LCSW, to reduce and manage symptoms of Depression and Stress, until well-established with mental health provider Incorporate into daily practice - relaxation techniques, deep breathing exercises, and mindfulness meditation strategies. Talk about feelings with friends, family members, spiritual advisor, etc. Contact LCSW directly [phone]), if you have questions, need assistance, or if additional social work needs are identified between now and our next scheduled telephone outreach call. Call 988 for mental health hotline/crisis line if needed (24/7 available) Try techniques to reduce symptoms of anxiety/negative thinking (deep breathing, distraction, positive self talk, etc)  - develop a personal safety plan - develop a plan to deal with triggers like holidays, anniversaries - exercise at least 2 to 3 times per week - have a plan for how to handle bad days - journal feelings and what helps to feel better or worse - spend time or talk with others at least 2 to 3 times per week - watch for early signs of feeling  worse - begin personal counseling - call and visit an old friend - check out volunteer opportunities - join a support group - laugh; watch a funny movie or comedian - learn and use visualization or guided imagery - perform a  random act of kindness - practice relaxation or meditation daily - start or continue a personal journal - practice positive thinking and self-talk -continue with compliance of taking medication  -identify current effective and ineffective coping strategies.  -implement positive self-talk in care to increase self-esteem, confidence and feelings of control.  -consider alternative and complementary therapy approaches such as meditation, mindfulness or yoga.  -journaling, prayer, worship services, meditation or pastoral counseling.  -increase participation in pleasurable group activities such as hobbies, singing, sports or volunteering).  -consider the use of meditative movement therapy such as tai chi, yoga or qigong.  -start a regular daily exercise program based on tolerance, ability and patient choice to support positive thinking and activity    If you are experiencing a Mental Health or Behavioral Health Crisis or need someone to talk to, please call the Suicide and Crisis Lifeline: 988    Patient Goals: Follow up goal     24- Hour Availability:    [facility]  [address] [place], [place] [phone] Crisis [phone]   [facility] [phone]  [place] [facility]  [phone]    [facility]  [phone] (after hours)   Therapeutic Alternative/Mobile Crisis   [phone]   [place] National Suicide Hotline  218-039-1011 Len Childs) [place] 829   Call (709)418-2813 for mental health emergencies   [facility]  [phone]);  [place] and [employer]  [phone]); [place], [place], [place], [place], [place], [place], [place]    [facility] Residents For 24/7 walk-up access to mental health services for [facility] children (4+), adolescents and adults, please visit the [facility] located  at [address] in [place], [place].  *[place] also provides comprehensive outpatient behavioral health services in a variety of locations around the [place].  Connect With [place] [address] [place], [place] [address] HelpLine: 502-520-9758 or 1-(339) 022-1711  Get Directions  Find Help 24/7 By Phone Call our 24-hour HelpLine at 940-369-4225 or [phone] for immediate assistance for mental health and substance abuse issues.  Walk-In Help [place]: [facility] (Ages 4 and Up) [place]: Emergency Dept., [facility] Additional Resources National Hopeline Network: 1-800-SUICIDE The National Suicide Prevention Lifeline: 1-800-273-TALK       Dickie La, BSW, MSW, LCSW Managed Medicaid LCSW [facility]  [facility] [email or web address] Phone: [phone]

## 2023-01-04 NOTE — Patient Outreach (Signed)
Medicaid Managed Care Social Work Note  01/04/2023 Name:  Veronica [name] MRN:  [id number] DOB:  03-16-1961  Veronica [name] is an 62 y.o. year old female who is a primary patient of Westley Chandler, MD.  The Medicaid Managed Care Coordination team was consulted for assistance with:  Mental Health Counseling and Resources  Ms. Durig was given information about Medicaid Managed Care Coordination team services today. Veronica [name] Patient agreed to services and verbal consent obtained.  Engaged with patient  for by telephone forfollow up visit in response to referral for case management and/or care coordination services.   Assessments/Interventions:  Review of past medical history, allergies, medications, health status, including review of consultants reports, laboratory and other test data, was performed as part of comprehensive evaluation and provision of chronic care management services.  SDOH: (Social Determinant of Health) assessments and interventions performed: SDOH Interventions    Flowsheet Row Patient Outreach Telephone from 01/04/2023 in [place] POPULATION HEALTH DEPARTMENT Patient Outreach Telephone from 12/19/2022 in [place] POPULATION HEALTH DEPARTMENT Community Documentation from 12/08/2022 in [facility] Patient Outreach Telephone from 12/06/2022 in [place] POPULATION HEALTH DEPARTMENT Patient Outreach Telephone from 11/06/2022 in [place] POPULATION HEALTH DEPARTMENT Patient Outreach Telephone from 10/23/2022 in [place] POPULATION HEALTH DEPARTMENT  SDOH Interventions        Food Insecurity Interventions -- -- Intervention Not Indicated -- -- --  Housing Interventions -- -- -- -- -- Intervention Not Indicated  Utilities Interventions -- -- Intervention Not Indicated -- -- --  Stress Interventions Provide Counseling, Offered [employer] Resources  [place] upcoming initial therapy session on 9/10] [employer], Provide  Counseling -- Offered [employer], Provide Counseling  [Still in need of establishing care with MH providers] [employer], Provide Counseling --       Advanced Directives Status:  See Care Plan for related entries.  Care Plan                 Allergies  Allergen Reactions   Amlodipine Swelling    Leg edema   Tessalon [Benzonatate] Other (See Comments)    Lip swelling    Medications Reviewed Today   Medications were not reviewed in this encounter     Patient Active Problem List   Diagnosis Date Noted   Class 2 severe obesity due to excess calories with serious comorbidity and body mass index (BMI) of 39.0 to 39.9 in adult [facility]) 12/20/2022   Esophageal dysphagia 04/15/2020   Mood disorder (HCC) 11/13/2019   Atherosclerosis of aorta (HCC) 07/15/2019   Prediabetes 07/13/2019   Chronic abdominal pain 07/13/2019   Insomnia 12/15/2012   Essential hypertension, benign 04/10/2012   SLE (systemic lupus erythematosus) (HCC) 04/10/2012    Conditions to be addressed/monitored per PCP order:  Anxiety and Depression  Care Plan : General Social Work (Adult)  Updates made by Gustavus Bryant, LCSW since 01/04/2023 12:00 AM     Problem: Emotional Distress      Goal: Emotional Health Supported by connecting for ongoing Therapy   Start Date: 02/02/2021  Expected End Date: 04/29/2021  Recent Progress: On track  Priority: High  Note:   Priority: High  Timeframe:  Short-Range Goal Priority:  High Start Date:   10/23/22           Expected End Date:  ongoing  Follow Up Date--01/18/23 at 11 am  Current Barriers:  Patient has not been able to connect to counseling/resources provided Disease Management support and education needs related to Mood Instability  CSW Clinical Goal(s):  Patient  will contact providers discussed today and will make decision on where she wishes to gain therapy  Interventions: Inter-disciplinary care  team collaboration (see longitudinal plan of care) Evaluation of current treatment plan related to  self management and patient's adherence to plan as established by provider Establishing healthy boundaries emphasized and healthy self-care education provided Patient was educated on available mental health resources within their area that accept Medicaid and offer counseling and psychiatry.  Patient educated on the difference between therapy and psychiatry per patient request Email sent to patient today with available mental health resources within her area that accept Medicaid and offer the services that she is interested in. Email included instructions for scheduling at [facility] as well as some crisis support resources and [facility]'s walk in clinic hours. Patient will review resources and make a decision regarding where she wishes to gain MH treatment at. Patient will work on implementing appropriate self-care habits into their daily routine such as: staying positive, writing a gratitude list, drinking water, staying active around the house, taking their medications as prescribed, combating negative thoughts or emotions and staying connected with their family and friends. Positive reinforcement provided for this decision to work on this. Motivational Interviewing employed Depression screen reviewed  PHQ2/ PHQ9 completed or reviewed  Mindfulness or Relaxation training provided Active listening / Reflection utilized  Advance Care and HCPOA education provided Emotional Support Provided Problem Solving /Task Center strategies reviewed Provided psychoeducation for mental health needs  Provided brief CBT  Reviewed mental health medications and discussed importance of compliance:  Quality of sleep assessed & Sleep Hygiene techniques promoted  Participation in counseling encouraged  Verbalization of feelings encouraged  Suicidal Ideation/Homicidal Ideation assessed: Patient denies SI/HI  Review resources,  discussed options and provided patient information about  Mental Health Resources Inter-disciplinary care team collaboration (see longitudinal plan of care) Update- Patient has an upcoming initial appointment for therapy with [facility] and is unsure of the address location so [facility] LCSW completed joint phone call to practice and was able to retrieve that needed information for the patient. Patient has already completed the electronic intake paperwork and only needs to show up for appointment. Patient was appreciative of this care coordination and support.   Patient Goals/Self-Care Activities: Over the next 90 days Attend scheduled medical appointments Utilize healthy coping skills and supportive resources discussed Contact PCP with any questions or concerns Keep 90 percent of counseling appointments Call your insurance provider for more information about your Enhanced Benefits  Check out counseling resources provided  Begin personal counseling with LCSW, to reduce and manage symptoms of Depression and Stress, until well-established with mental health provider Incorporate into daily practice - relaxation techniques, deep breathing exercises, and mindfulness meditation strategies. Talk about feelings with friends, family members, spiritual advisor, etc. Contact LCSW directly [phone]), if you have questions, need assistance, or if additional social work needs are identified between now and our next scheduled telephone outreach call. Call 988 for mental health hotline/crisis line if needed (24/7 available) Try techniques to reduce symptoms of anxiety/negative thinking (deep breathing, distraction, positive self talk, etc)  - develop a personal safety plan - develop a plan to deal with triggers like holidays, anniversaries - exercise at least 2 to 3 times per week - have a plan  for how to handle bad days - journal feelings and what helps to feel better or worse - spend time or  talk with others at least 2 to 3 times per week - watch for early signs of feeling worse - begin personal counseling - call and visit an old friend - check out volunteer opportunities - join a support group - laugh; watch a funny movie or comedian - learn and use visualization or guided imagery - perform a random act of kindness - practice relaxation or meditation daily - start or continue a personal journal - practice positive thinking and self-talk -continue with compliance of taking medication  -identify current effective and ineffective coping strategies.  -implement positive self-talk in care to increase self-esteem, confidence and feelings of control.  -consider alternative and complementary therapy approaches such as meditation, mindfulness or yoga.  -journaling, prayer, worship services, meditation or pastoral counseling.  -increase participation in pleasurable group activities such as hobbies, singing, sports or volunteering).  -consider the use of meditative movement therapy such as tai chi, yoga or qigong.  -start a regular daily exercise program based on tolerance, ability and patient choice to support positive thinking and activity    If you are experiencing a Mental Health or Behavioral Health Crisis or need someone to talk to, please call the Suicide and Crisis Lifeline: 988    Patient Goals: Follow up goal      Follow up:  Patient agrees to Care Plan and Follow-up.  Plan: The Managed Medicaid care management team will reach out to the patient again over the next 30 days.  Dickie La, BSW, MSW, [employer] Managed Medicaid LCSW [facility]  [facility] [email or web address] Phone: [phone]

## 2023-01-08 ENCOUNTER — Ambulatory Visit: Payer: 59 | Admitting: Licensed Clinical Social Worker

## 2023-01-08 DIAGNOSIS — F331 Major depressive disorder, recurrent, moderate: Secondary | ICD-10-CM

## 2023-01-08 DIAGNOSIS — F411 Generalized anxiety disorder: Secondary | ICD-10-CM
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
## 2023-01-11 ENCOUNTER — Ambulatory Visit (INDEPENDENT_AMBULATORY_CARE_PROVIDER_SITE_OTHER): Payer: 59

## 2023-01-11 ENCOUNTER — Ambulatory Visit: Payer: 59

## 2023-01-11 ENCOUNTER — Ambulatory Visit: Payer: 59 | Attending: Rheumatology | Admitting: Rheumatology

## 2023-01-11 ENCOUNTER — Encounter: Payer: Self-pay | Admitting: Rheumatology

## 2023-01-11 VITALS — BP 118/86 | HR 84 | Resp 17 | Ht 64.0 in | Wt 241.4 lb

## 2023-01-11 DIAGNOSIS — R1319 Other dysphagia: Secondary | ICD-10-CM

## 2023-01-11 DIAGNOSIS — G4709 Other insomnia: Secondary | ICD-10-CM

## 2023-01-11 DIAGNOSIS — M79641 Pain in right hand: Secondary | ICD-10-CM

## 2023-01-11 DIAGNOSIS — M25561 Pain in right knee: Secondary | ICD-10-CM | POA: Diagnosis not present

## 2023-01-11 DIAGNOSIS — M79671 Pain in right foot: Secondary | ICD-10-CM | POA: Diagnosis not present

## 2023-01-11 DIAGNOSIS — D75A Glucose-6-phosphate dehydrogenase (G6PD) deficiency without anemia: Secondary | ICD-10-CM

## 2023-01-11 DIAGNOSIS — I10 Essential (primary) hypertension: Secondary | ICD-10-CM

## 2023-01-11 DIAGNOSIS — M79642 Pain in left hand: Secondary | ICD-10-CM

## 2023-01-11 DIAGNOSIS — E66812 Obesity, class 2: Secondary | ICD-10-CM

## 2023-01-11 DIAGNOSIS — Z820 Family history of epilepsy and other diseases of the nervous system: Secondary | ICD-10-CM

## 2023-01-11 DIAGNOSIS — G5601 Carpal tunnel syndrome, right upper limb: Secondary | ICD-10-CM

## 2023-01-11 DIAGNOSIS — M25562 Pain in left knee: Secondary | ICD-10-CM | POA: Diagnosis not present

## 2023-01-11 DIAGNOSIS — M3219 Other organ or system involvement in systemic lupus erythematosus: Secondary | ICD-10-CM

## 2023-01-11 DIAGNOSIS — G8929 Other chronic pain: Secondary | ICD-10-CM | POA: Diagnosis not present

## 2023-01-11 DIAGNOSIS — Z79899 Other long term (current) drug therapy: Secondary | ICD-10-CM

## 2023-01-11 DIAGNOSIS — M79672 Pain in left foot: Secondary | ICD-10-CM

## 2023-01-11 DIAGNOSIS — R7303 Prediabetes: Secondary | ICD-10-CM

## 2023-01-11 DIAGNOSIS — E785 Hyperlipidemia, unspecified: Secondary | ICD-10-CM

## 2023-01-11 DIAGNOSIS — M329 Systemic lupus erythematosus, unspecified: Secondary | ICD-10-CM

## 2023-01-11 DIAGNOSIS — E559 Vitamin D deficiency, unspecified: Secondary | ICD-10-CM

## 2023-01-11 DIAGNOSIS — Z6839 Body mass index (BMI) 39.0-39.9, adult: Secondary | ICD-10-CM

## 2023-01-11 DIAGNOSIS — F39 Unspecified mood [affective] disorder: Secondary | ICD-10-CM

## 2023-01-11 DIAGNOSIS — M797 Fibromyalgia: Secondary | ICD-10-CM

## 2023-01-11 DIAGNOSIS — R5383 Other fatigue: Secondary | ICD-10-CM

## 2023-01-11 DIAGNOSIS — I700 Atherosclerosis of aorta: Secondary | ICD-10-CM

## 2023-01-11 MED ORDER — HYDROXYCHLOROQUINE SULFATE 200 MG PO TABS
200.0000 mg | ORAL_TABLET | Freq: Two times a day (BID) | ORAL | 0 refills | Status: DC
Start: 2023-01-11 — End: 2023-04-08

## 2023-01-11 NOTE — Patient Instructions (Addendum)
Hydroxychloroquine Tablets What is this medication? HYDROXYCHLOROQUINE (hye drox ee KLOR oh kwin) treats autoimmune conditions, such as rheumatoid arthritis and lupus. It works by slowing down an overactive immune system. It may also be used to prevent and treat malaria. It works by killing the parasite that causes malaria. It belongs to a group of medications called DMARDs. This medicine may be used for other purposes; ask your health care provider or pharmacist if you have questions. COMMON BRAND NAME(S): Plaquenil, Quineprox, SOVUNA What should I tell my care team before I take this medication? They need to know if you have any of these conditions: Diabetes Eye disease, vision problems Frequently drink alcohol G6PD deficiency Heart disease Irregular heartbeat or rhythm Kidney disease Liver disease Porphyria Psoriasis An unusual or allergic reaction to hydroxychloroquine, other medications, foods, dyes, or preservatives Pregnant or trying to get pregnant Breastfeeding How should I use this medication? Take this medication by mouth with water. Take it as directed on the prescription label. Do not cut, crush, or chew this medication. Swallow the tablets whole. Take it with food. Do not take it more than directed. Take all of this medication unless your care team tells you to stop it early. Keep taking it even if you think you are better. Take products with antacids in them at a different time of day than this medication. Take this medication 4 hours before or 4 hours after antacids. Talk to your care team if you have questions. Talk to your care team about the use of this medication in children. While this medication may be prescribed for selected conditions, precautions do apply. Overdosage: If you think you have taken too much of this medicine contact a poison control center or emergency room at once. NOTE: This medicine is only for you. Do not share this medicine with others. What if I  miss a dose? If you miss a dose, take it as soon as you can. If it is almost time for your next dose, take only that dose. Do not take double or extra doses. What may interact with this medication? Do not take this medication with any of the following: Cisapride Dronedarone Pimozide Thioridazine This medication may also interact with the following: Ampicillin Antacids Cimetidine Cyclosporine Digoxin Kaolin Medications for diabetes, such as insulin, glipizide, glyburide Medications for seizures, such as carbamazepine, phenobarbital, phenytoin Mefloquine Methotrexate Other medications that cause heart rhythm changes Praziquantel This list may not describe all possible interactions. Give your health care provider a list of all the medicines, herbs, non-prescription drugs, or dietary supplements you use. Also tell them if you smoke, drink alcohol, or use illegal drugs. Some items may interact with your medicine. What should I watch for while using this medication? Visit your care team for regular checks on your progress. Tell your care team if your symptoms do not start to get better or if they get worse. You may need blood work done while you are taking this medication. If you take other medications that can affect heart rhythm, you may need more testing. Talk to your care team if you have questions. Your vision may be tested before and during use of this medication. Tell your care team right away if you have any change in your eyesight. This medication may cause serious skin reactions. They can happen weeks to months after starting the medication. Contact your care team right away if you notice fevers or flu-like symptoms with a rash. The rash may be red or purple and then  turn into blisters or peeling of the skin. Or, you might notice a red rash with swelling of the face, lips or lymph nodes in your neck or under your arms. If you or your family notice any changes in your behavior, such as  new or worsening depression, thoughts of harming yourself, anxiety, or other unusual or disturbing thoughts, or memory loss, call your care team right away. What side effects may I notice from receiving this medication? Side effects that you should report to your care team as soon as possible: Allergic reactions--skin rash, itching, hives, swelling of the face, lips, tongue, or throat Aplastic anemia--unusual weakness or fatigue, dizziness, headache, trouble breathing, increased bleeding or bruising Change in vision Heart rhythm changes--fast or irregular heartbeat, dizziness, feeling faint or lightheaded, chest pain, trouble breathing Infection--fever, chills, cough, or sore throat Low blood sugar (hypoglycemia)--tremors or shaking, anxiety, sweating, cold or clammy skin, confusion, dizziness, rapid heartbeat Muscle injury--unusual weakness or fatigue, muscle pain, dark yellow or brown urine, decrease in amount of urine Pain, tingling, or numbness in the hands or feet Rash, fever, and swollen lymph nodes Redness, blistering, peeling, or loosening of the skin, including inside the mouth Thoughts of suicide or self-harm, worsening mood, or feelings of depression Unusual bruising or bleeding Side effects that usually do not require medical attention (report to your care team if they continue or are bothersome): Diarrhea Headache Nausea Stomach pain Vomiting This list may not describe all possible side effects. Call your doctor for medical advice about side effects. You may report side effects to FDA at 1-800-FDA-1088. Where should I keep my medication? Keep out of the reach of children and pets. Store at room temperature up to 30 degrees C (86 degrees F). Protect from light. Get rid of any unused medication after the expiration date. To get rid of medications that are no longer needed or have expired: Take the medication to a medication take-back program. Check with your pharmacy or law  enforcement to find a location. If you cannot return the medication, check the label or package insert to see if the medication should be thrown out in the garbage or flushed down the toilet. If you are not sure, ask your care team. If it is safe to put it in the trash, empty the medication out of the container. Mix the medication with cat litter, dirt, coffee grounds, or other unwanted substance. Seal the mixture in a bag or container. Put it in the trash. NOTE: This sheet is a summary. It may not cover all possible information. If you have questions about this medicine, talk to your doctor, pharmacist, or health care provider.  2024 Elsevier/Gold Standard (2021-10-23 00:00:00)   Standing Labs We placed an order today for your standing lab work.   Please have your standing labs drawn in December and every 3 months  Please have your labs drawn 2 weeks prior to your appointment so that the provider can discuss your lab results at your appointment, if possible.  Please note that you may see your imaging and lab results in MyChart before we have reviewed them. We will contact you once all results are reviewed. Please allow our office up to 72 hours to thoroughly review all of the results before contacting the office for clarification of your results.  WALK-IN LAB HOURS  Monday through Thursday from 8:00 am -12:30 pm and 1:00 pm-5:00 pm and Friday from 8:00 am-12:00 pm.  Patients with office visits requiring labs will be seen before  walk-in labs.  You may encounter longer than normal wait times. Please allow additional time. Wait times may be shorter on  Monday and Thursday afternoons.  We do not book appointments for walk-in labs. We appreciate your patience and understanding with our staff.   Labs are drawn by [facility]. Please bring your co-pay at the time of your lab draw.  You may receive a bill from [facility] for your lab work.  Please note if you are on Hydroxychloroquine and and an order has  been placed for a Hydroxychloroquine level,  you will need to have it drawn 4 hours or more after your last dose.  If you wish to have your labs drawn at another location, please call the office 24 hours in advance so we can fax the orders.  The office is located at [address], [address], [place], [place] [address]   If you have any questions regarding directions or hours of operation,  please call [phone].   As a reminder, please drink plenty of water prior to coming for your lab work. Thanks!  Vaccines You are taking a medication(s) that can suppress your immune system.  The following immunizations are recommended: Flu annually Covid-19  RSV Td/Tdap (tetanus, diphtheria, pertussis) every 10 years Pneumonia (Prevnar 15 then Pneumovax 23 at least 1 year apart.  Alternatively, can take Prevnar 20 without needing additional dose) Shingrix: 2 doses from 4 weeks to 6 months apart  Please check with your PCP to make sure you are up to date.

## 2023-01-14 NOTE — Progress Notes (Signed)
Creatinine is mildly elevated.  Patient should avoid NSAIDs and increase water intake.  CK is mildly elevated.  Labs are consistent with autoimmune disease.  I will discuss results at the follow-up visit.

## 2023-01-15 ENCOUNTER — Other Ambulatory Visit: Payer: Self-pay

## 2023-01-15 MED ORDER — TOPIRAMATE 50 MG PO TABS: 50.0000 mg | ORAL_TABLET | Freq: Every day | ORAL | 3 refills | Status: DC

## 2023-01-17 LAB — CBC WITH DIFFERENTIAL/PLATELET
Absolute Monocytes: 374 cells/uL (ref 200–950)
Basophils Absolute: 22 cells/uL (ref 0–200)
Basophils Relative: 0.4 %
Eosinophils Absolute: 50 cells/uL (ref 15–500)
Eosinophils Relative: 0.9 %
HCT: 39 % (ref 35.0–45.0)
Hemoglobin: 12.6 g/dL (ref 11.7–15.5)
Lymphs Abs: 2002 cells/uL (ref 850–3900)
MCH: 27.9 pg (ref 27.0–33.0)
MCHC: 32.3 g/dL (ref 32.0–36.0)
MCV: 86.3 fL (ref 80.0–100.0)
MPV: 9.7 fL (ref 7.5–12.5)
Monocytes Relative: 6.8 %
Neutro Abs: 3053 cells/uL (ref 1500–7800)
Neutrophils Relative %: 55.5 %
Platelets: 344 10*3/uL (ref 140–400)
RBC: 4.52 10*6/uL (ref 3.80–5.10)
RDW: 12.1 % (ref 11.0–15.0)
Total Lymphocyte: 36.4 %
WBC: 5.5 10*3/uL (ref 3.8–10.8)

## 2023-01-17 LAB — COMPLETE METABOLIC PANEL WITH GFR
AG Ratio: 1.2 (calc) (ref 1.0–2.5)
ALT: 23 U/L (ref 6–29)
AST: 24 U/L (ref 10–35)
Albumin: 4.3 g/dL (ref 3.6–5.1)
Alkaline phosphatase (APISO): 56 U/L (ref 37–153)
BUN/Creatinine Ratio: 19 (calc) (ref 6–22)
BUN: 20 mg/dL (ref 7–25)
CO2: 28 mmol/L (ref 20–32)
Calcium: 9.8 mg/dL (ref 8.6–10.4)
Chloride: 102 mmol/L (ref 98–110)
Creat: 1.06 mg/dL — ABNORMAL HIGH (ref 0.50–1.03)
Globulin: 3.7 g/dL (calc) (ref 1.9–3.7)
Glucose, Bld: 85 mg/dL (ref 65–99)
Potassium: 3.9 mmol/L (ref 3.5–5.3)
Sodium: 138 mmol/L (ref 135–146)
Total Bilirubin: 0.5 mg/dL (ref 0.2–1.2)
Total Protein: 8 g/dL (ref 6.1–8.1)
eGFR: 61 mL/min/{1.73_m2} (ref >=60)

## 2023-01-17 LAB — SEDIMENTATION RATE: Sed Rate: 6 mm/h (ref 0–30)

## 2023-01-17 LAB — CARDIOLIPIN ANTIBODIES, IGG, IGM, IGA
Anticardiolipin IgA: 22.2 APL-U/mL — ABNORMAL HIGH (ref <20.0)
Anticardiolipin IgG: 7.9 GPL-U/mL (ref <20.0)
Anticardiolipin IgM: <2 MPL-U/mL (ref <20.0)

## 2023-01-17 LAB — RHEUMATOID FACTOR: Rheumatoid fact SerPl-aCnc: <10 IU/mL (ref <14)

## 2023-01-17 LAB — ANTI-NUCLEAR AB-TITER (ANA TITER)
ANA TITER: 1:320 {titer} — ABNORMAL HIGH
ANA Titer 1: 1:320 {titer} — ABNORMAL HIGH

## 2023-01-17 LAB — BETA-2 GLYCOPROTEIN ANTIBODIES
Beta-2 Glyco 1 IgA: 19.6 U/mL (ref <20.0)
Beta-2 Glyco 1 IgM: <2 U/mL (ref <20.0)
Beta-2 Glyco I IgG: 12.6 U/mL (ref <20.0)

## 2023-01-17 LAB — RNP ANTIBODY: Ribonucleic Protein(ENA) Antibody, IgG: 2.6 AI — AB

## 2023-01-17 LAB — SJOGRENS SYNDROME-B EXTRACTABLE NUCLEAR ANTIBODY: SSB (La) (ENA) Antibody, IgG: <1 AI

## 2023-01-17 LAB — ANTI-DNA ANTIBODY, DOUBLE-STRANDED: ds DNA Ab: 2 IU/mL

## 2023-01-17 LAB — LUPUS ANTICOAGULANT EVAL W/ REFLEX
PTT-LA Screen: 29 s (ref <=40)
dRVVT: 30 s (ref <=45)

## 2023-01-17 LAB — ANTI-SCLERODERMA ANTIBODY: Scleroderma (Scl-70) (ENA) Antibody, IgG: <1 AI

## 2023-01-17 LAB — CK: Total CK: 247 U/L — ABNORMAL HIGH (ref 29–143)

## 2023-01-17 LAB — URIC ACID: Uric Acid, Serum: 4 mg/dL (ref 2.5–7.0)

## 2023-01-17 LAB — ANA: Anti Nuclear Antibody (ANA): POSITIVE — AB

## 2023-01-17 LAB — CYCLIC CITRUL PEPTIDE ANTIBODY, IGG: Cyclic Citrullin Peptide Ab: <16 UNITS

## 2023-01-17 LAB — VITAMIN D 25 HYDROXY (VIT D DEFICIENCY, FRACTURES): Vit D, 25-Hydroxy: 34 ng/mL (ref 30–100)

## 2023-01-17 LAB — ANTI-SMITH ANTIBODY: ENA SM Ab Ser-aCnc: <1 AI

## 2023-01-17 LAB — TSH: TSH: 1.54 mIU/L (ref 0.40–4.50)

## 2023-01-17 LAB — SJOGRENS SYNDROME-A EXTRACTABLE NUCLEAR ANTIBODY: SSA (Ro) (ENA) Antibody, IgG: >8 AI — AB

## 2023-01-18 ENCOUNTER — Other Ambulatory Visit: Payer: 59 | Admitting: Licensed Clinical Social Worker

## 2023-01-18 NOTE — Patient Instructions (Signed)
Visit Information  Veronica [name] was given information about Medicaid Managed Care team care coordination services as a part of their [employer] Medicaid benefit. Veronica [name] verbally consentedto engagement with the [facility] Managed Care team.   If you are experiencing a medical emergency, please call 911 or report to your local emergency department or urgent care.   If you have a non-emergency medical problem during routine business hours, please contact your provider's office and ask to speak with a nurse.   For questions related to your [employer] [facility] health plan, please call: [phone]  OR visit the member homepage at: [email or web address]  If you would like to schedule transportation through your [facility] plan, please call the following number at least 2 days in advance of your appointment: [phone]  If you are experiencing a behavioral health crisis, call the [facility] Crisis Line at [phone]). The line is available 24 hours a day, seven days a week.  If you would like help to quit smoking, call 1-800-QUIT-NOW (470-877-7606) OR Espaol: 1-855-Djelo-Ya (6-644-034-7425) o para ms informacin haga clic aqu or Text READY to 956-387 to register via text  Following is a copy of your plan of care:  Care Plan : General Social Work (Adult)  Updates made by Gustavus Bryant, LCSW since 01/18/2023 12:00 AM     Problem: Emotional Distress      Goal: Emotional Health Supported by connecting for ongoing Therapy   Start Date: 02/02/2021  Expected End Date: 04/29/2021  Recent Progress: On track  Priority: High  Note:   Priority: High  Timeframe:  Short-Range Goal Priority:  High Start Date:   10/23/22           Expected End Date:  ongoing                     Follow Up Date--02/13/23 at 1 pm  Current Barriers:  Patient has not  been able to connect to counseling/resources provided Disease Management support and education needs related to Mood Instability  CSW Clinical Goal(s):  Patient  will contact providers discussed today and will make decision on where she wishes to gain therapy  Patient Goals/Self-Care Activities: Over the next 90 days Attend scheduled medical appointments Utilize healthy coping skills and supportive resources discussed Contact PCP with any questions or concerns Keep 90 percent of counseling appointments Call your insurance provider for more information about your Enhanced Benefits  Check out counseling resources provided  Begin personal counseling with LCSW, to reduce and manage symptoms of Depression and Stress, until well-established with mental health provider Incorporate into daily practice - relaxation techniques, deep breathing exercises, and mindfulness meditation strategies. Talk about feelings with friends, family members, spiritual advisor, etc. Contact LCSW directly [phone]), if you have questions, need assistance, or if additional social work needs are identified between now and our next scheduled telephone outreach call. Call 988 for mental health hotline/crisis line if needed (24/7 available) Try techniques to reduce symptoms of anxiety/negative thinking (deep breathing, distraction, positive self talk, etc)  - develop a personal safety plan - develop a plan to deal with triggers like holidays, anniversaries - exercise at least 2 to 3 times per week - have a plan for how to handle bad days - journal feelings and what helps to feel better or worse - spend time or talk with others at least 2 to 3 times per week - watch for early signs of feeling  worse - begin personal counseling - call and visit an old friend - check out volunteer opportunities - join a support group - laugh; watch a funny movie or comedian - learn and use visualization or guided imagery - perform a  random act of kindness - practice relaxation or meditation daily - start or continue a personal journal - practice positive thinking and self-talk -continue with compliance of taking medication  -identify current effective and ineffective coping strategies.  -implement positive self-talk in care to increase self-esteem, confidence and feelings of control.  -consider alternative and complementary therapy approaches such as meditation, mindfulness or yoga.  -journaling, prayer, worship services, meditation or pastoral counseling.  -increase participation in pleasurable group activities such as hobbies, singing, sports or volunteering).  -consider the use of meditative movement therapy such as tai chi, yoga or qigong.  -start a regular daily exercise program based on tolerance, ability and patient choice to support positive thinking and activity    If you are experiencing a Mental Health or Behavioral Health Crisis or need someone to talk to, please call the Suicide and Crisis Lifeline: 988    Patient Goals: Follow up goal     24- Hour Availability:    [facility]  [address] [place], [place] [phone] Crisis [phone]   [facility] [phone]  [place] [facility]  [phone]    [facility]  [phone] (after hours)   Therapeutic Alternative/Mobile Crisis   [phone]   [place] National Suicide Hotline  813-882-5241 Len Child[place]) [place] [address]   Call (337)400-9976 for mental health emergencies   [facility]  [phone]);  [place] and [employer]  [phone]); [place], [place], [place], [place], [place], [place], [place]    [place] [facility] Residents For 24/7 walk-up access to mental health services for [facility] children (4+), adolescents and adults, please visit the [facility] located  at [address] in [place], [place].  *[facility] also provides comprehensive outpatient behavioral health services in a variety of locations around the [place].  Connect With [place] [address] [place], [place] [address] HelpLine: 6840908969 or 1-807-381-3658  Get Directions  Find Help 24/7 By Phone Call our 24-hour HelpLine at 775-399-0695 or 660-315-8425 for immediate assistance for mental health and substance abuse issues.  Walk-In Help [place]: [facility] (Ages 4 and Up) [place]: Emergency Dept., [facility] Additional Resources National Hopeline Network: 1-800-SUICIDE The National Suicide Prevention Lifeline: 1-800-273-TALK     Dickie La, BSW, MSW, LCSW Managed Medicaid LCSW [facility]  [facility] [email or web address] Phone: [phone]

## 2023-01-18 NOTE — Patient Outreach (Signed)
Medicaid Managed Care Social Work Note  01/18/2023 Name:  [name] MRN:  [id number] DOB:  1960-11-30  Veronica [name] is an 62 y.o. year old female who is a primary patient of Veronica Chandler, MD.  The Medicaid Managed Care Coordination team was consulted for assistance with:  Mental Health Counseling and Resources  Ms. Contois was given information about Medicaid Managed Care Coordination team services today. Veronica [name] Patient agreed to services and verbal consent obtained.  Engaged with patient  for by telephone forfollow up visit in response to referral for case management and/or care coordination services.   Assessments/Interventions:  Review of past medical history, allergies, medications, health status, including review of consultants reports, laboratory and other test data, was performed as part of comprehensive evaluation and provision of chronic care management services.  SDOH: (Social Determinant of Health) assessments and interventions performed: SDOH Interventions    Flowsheet Row Patient Outreach Telephone from 01/18/2023 in [place] POPULATION HEALTH DEPARTMENT Patient Outreach Telephone from 01/04/2023 in [place] POPULATION HEALTH DEPARTMENT Patient Outreach Telephone from 12/19/2022 in [place] POPULATION HEALTH DEPARTMENT Community Documentation from 12/08/2022 in [facility] Patient Outreach Telephone from 12/06/2022 in [place] POPULATION HEALTH DEPARTMENT Patient Outreach Telephone from 11/06/2022 in [place] POPULATION HEALTH DEPARTMENT  SDOH Interventions        Food Insecurity Interventions -- -- -- Intervention Not Indicated -- --  Utilities Interventions -- -- -- Intervention Not Indicated -- --  Stress Interventions Offered [employer], Provide Counseling Provide Counseling, Offered Community Wellness Resources  [place] upcoming initial therapy session on 9/10] [employer], Provide Counseling  -- Offered [employer], Provide Counseling  [Still in need of establishing care with MH providers] [employer], Provide Counseling       Advanced Directives Status:  See Care Plan for related entries.  Care Plan                 Allergies  Allergen Reactions   Amlodipine Swelling    Leg edema   Tessalon [Benzonatate] Other (See Comments)    Lip swelling    Medications Reviewed Today   Medications were not reviewed in this encounter     Patient Active Problem List   Diagnosis Date Noted   Major depressive disorder, recurrent episode, moderate (HCC) 01/08/2023   Class 2 severe obesity due to excess calories with serious comorbidity and body mass index (BMI) of 39.0 to 39.9 in adult [facility]) 12/20/2022   Esophageal dysphagia 04/15/2020   Mood disorder (HCC) 11/13/2019   Atherosclerosis of aorta (HCC) 07/15/2019   Prediabetes 07/13/2019   Chronic abdominal pain 07/13/2019   Generalized anxiety disorder 05/15/2019   Insomnia 12/15/2012   Essential hypertension, benign 04/10/2012   SLE (systemic lupus erythematosus) (HCC) 04/10/2012    Conditions to be addressed/monitored per PCP order:   Stress management  Care Plan : General Social Work (Adult)  Updates made by Gustavus Bryant, LCSW since 01/18/2023 12:00 AM     Problem: Emotional Distress      Goal: Emotional Health Supported by connecting for ongoing Therapy   Start Date: 02/02/2021  Expected End Date: 04/29/2021  Recent Progress: On track  Priority: High  Note:   Priority: High  Timeframe:  Short-Range Goal Priority:  High Start Date:   10/23/22           Expected End Date:  ongoing  Follow Up Date--02/13/23 at 1 pm  Current Barriers:  Patient has not been able to connect to counseling/resources provided Disease Management support and education needs related to Mood Instability  CSW Clinical Goal(s):  Patient  will contact providers discussed today  and will make decision on where she wishes to gain therapy  Interventions: Inter-disciplinary care team collaboration (see longitudinal plan of care) Evaluation of current treatment plan related to  self management and patient's adherence to plan as established by provider Establishing healthy boundaries emphasized and healthy self-care education provided Patient was educated on available mental health resources within their area that accept Medicaid and offer counseling and psychiatry.  Patient educated on the difference between therapy and psychiatry per patient request Email sent to patient today with available mental health resources within her area that accept Medicaid and offer the services that she is interested in. Email included instructions for scheduling at [facility] as well as some crisis support resources and [facility]'s walk in clinic hours. Patient will review resources and make a decision regarding where she wishes to gain MH treatment at. Patient will work on implementing appropriate self-care habits into their daily routine such as: staying positive, writing a gratitude list, drinking water, staying active around the house, taking their medications as prescribed, combating negative thoughts or emotions and staying connected with their family and friends. Positive reinforcement provided for this decision to work on this. Motivational Interviewing employed Depression screen reviewed  PHQ2/ PHQ9 completed or reviewed  Mindfulness or Relaxation training provided Active listening / Reflection utilized  Advance Care and HCPOA education provided Emotional Support Provided Problem Solving /Task Center strategies reviewed Provided psychoeducation for mental health needs  Provided brief CBT  Reviewed mental health medications and discussed importance of compliance:  Quality of sleep assessed & Sleep Hygiene techniques promoted  Participation in counseling encouraged  Verbalization of feelings  encouraged  Suicidal Ideation/Homicidal Ideation assessed: Patient denies SI/HI  Review resources, discussed options and provided patient information about  Mental Health Resources Inter-disciplinary care team collaboration (see longitudinal plan of care) Update- Patient has an upcoming initial appointment for therapy with [facility] and is unsure of the address location so [facility] LCSW completed joint phone call to practice and was able to retrieve that needed information for the patient. Patient has already completed the electronic intake paperwork and only needs to show up for appointment. Patient was appreciative of this care coordination and support. 01/18/23 update- Patient reports that her first initial therapy appointment went very well. [facility] LCSW will make one final follow up call next month to ensure all of her social work needs have been met. She reports that today is her birthday and she will be practicing a lot of self-care to celebrate. Brief stress management education provided to patient. Positive reinforcement provided for recent positive self-care implementation she has put into place in her daily schedule.   Patient Goals/Self-Care Activities: Over the next 90 days Attend scheduled medical appointments Utilize healthy coping skills and supportive resources discussed Contact PCP with any questions or concerns Keep 90 percent of counseling appointments Call your insurance provider for more information about your Enhanced Benefits  Check out counseling resources provided  Begin personal counseling with LCSW, to reduce and manage symptoms of Depression and Stress, until well-established with mental health provider Incorporate into daily practice - relaxation techniques, deep breathing exercises, and mindfulness meditation strategies. Talk about feelings with friends, family members, spiritual advisor, etc. Contact LCSW directly [phone]), if you have questions,  need  assistance, or if additional social work needs are identified between now and our next scheduled telephone outreach call. Call 988 for mental health hotline/crisis line if needed (24/7 available) Try techniques to reduce symptoms of anxiety/negative thinking (deep breathing, distraction, positive self talk, etc)  - develop a personal safety plan - develop a plan to deal with triggers like holidays, anniversaries - exercise at least 2 to 3 times per week - have a plan for how to handle bad days - journal feelings and what helps to feel better or worse - spend time or talk with others at least 2 to 3 times per week - watch for early signs of feeling worse - begin personal counseling - call and visit an old friend - check out volunteer opportunities - join a support group - laugh; watch a funny movie or comedian - learn and use visualization or guided imagery - perform a random act of kindness - practice relaxation or meditation daily - start or continue a personal journal - practice positive thinking and self-talk -continue with compliance of taking medication  -identify current effective and ineffective coping strategies.  -implement positive self-talk in care to increase self-esteem, confidence and feelings of control.  -consider alternative and complementary therapy approaches such as meditation, mindfulness or yoga.  -journaling, prayer, worship services, meditation or pastoral counseling.  -increase participation in pleasurable group activities such as hobbies, singing, sports or volunteering).  -consider the use of meditative movement therapy such as tai chi, yoga or qigong.  -start a regular daily exercise program based on tolerance, ability and patient choice to support positive thinking and activity    If you are experiencing a Mental Health or Behavioral Health Crisis or need someone to talk to, please call the Suicide and Crisis Lifeline: 988    Patient Goals: Follow up  goal      Follow up:  Patient agrees to Care Plan and Follow-up.  Plan: The Managed Medicaid care management team will reach out to the patient again over the next 30 days.  Dickie La, BSW, MSW, [employer] Managed Medicaid LCSW [facility]  [facility] [email or web address] Phone: [phone]

## 2023-01-22 ENCOUNTER — Ambulatory Visit: Payer: 59 | Admitting: Licensed Clinical Social Worker

## 2023-01-25 NOTE — Progress Notes (Signed)
Office Visit Note  Patient: Veronica [name]             Date of Birth: [birth year]           MRN: [id number]             PCP: Westley Chandler, MD Referring: Westley Chandler, MD Visit Date: 02/08/2023 Occupation: @GUAROCC @  Subjective: Medication management   History of Present Illness: Veronica [name] is a 62 y.o. female with history of systemic lupus treatment doses diagnosed by her previous rheumatologist based on serology, malar rash, arthritis and pleural effusion.  She returns for follow-up visit.  She states she continues to have pain and discomfort in her joints which she describes in her hands, knees and her feet.  She has not noticed any joint swelling.  She continues to have some generalized muscle aches and fatigue.  She has known history of fibromyalgia.  She denies any history of oral ulcers, nasal ulcers, malar rash, photosensitivity, Raynaud's.  She gives history of dry mouth.    Activities of Daily Living:  Patient reports morning stiffness for 5 minutes.   Patient Reports nocturnal pain.  Difficulty dressing/grooming: Denies Difficulty climbing stairs: Reports Difficulty getting out of chair: Reports Difficulty using hands for taps, buttons, cutlery, and/or writing: Reports  Review of Systems  Constitutional:  Positive for fatigue.  HENT:  Positive for mouth dryness. Negative for mouth sores.   Eyes:  Negative for dryness.  Respiratory:  Negative for shortness of breath.   Cardiovascular:  Negative for chest pain and palpitations.  Gastrointestinal:  Positive for constipation. Negative for blood in stool and diarrhea.  Endocrine: Positive for increased urination.  Genitourinary:  Positive for nocturia. Negative for painful urination and involuntary urination.  Musculoskeletal:  Positive for joint pain, joint pain, joint swelling, myalgias, morning stiffness and myalgias. Negative for gait problem, muscle weakness and muscle tenderness.  Skin:  Negative for  color change, rash, hair loss and sensitivity to sunlight.  Allergic/Immunologic: Positive for susceptible to infections.  Neurological:  Positive for dizziness and headaches.  Hematological:  Negative for swollen glands.  Psychiatric/Behavioral:  Positive for depressed mood and sleep disturbance. The patient is nervous/anxious.     PMFS History:  Patient Active Problem List   Diagnosis Date Noted   Major depressive disorder, recurrent episode, moderate (HCC) 01/08/2023   Class 2 severe obesity due to excess calories with serious comorbidity and body mass index (BMI) of 39.0 to 39.9 in adult [facility]) 12/20/2022   Esophageal dysphagia 04/15/2020   Mood disorder (HCC) 11/13/2019   Atherosclerosis of aorta (HCC) 07/15/2019   Prediabetes 07/13/2019   Chronic abdominal pain 07/13/2019   Generalized anxiety disorder 05/15/2019   Insomnia 12/15/2012   Essential hypertension, benign 04/10/2012   SLE (systemic lupus erythematosus) (HCC) 04/10/2012    Past Medical History:  Diagnosis Date   Daily headache    Diverticulitis    Fibromyalgia    G6PD deficiency    outside labs--low level, no longer on HCQ   Glucose 6 phosphatase deficiency (HCC)    History of cholelithiasis    History of colon polyps    Hypertension    Prediabetes    Rheumatoid arthritis (HCC)    SLE (systemic lupus erythematosus) (HCC) 1998    Family History  Problem Relation Age of Onset   Diabetes Mother    Bladder Cancer Mother    Hypertension Father    Kidney disease Father    Diabetes Sister  Multiple sclerosis Sister    Diabetes Brother    Hypertension Brother    Healthy Son    Healthy Son    Past Surgical History:  Procedure Laterality Date   ABDOMINAL HYSTERECTOMY  09/2008   CESAREAN SECTION  1992, 1995   CHOLECYSTECTOMY N/A 07/18/2012   Procedure: LAPAROSCOPIC CHOLECYSTECTOMY WITH INTRAOPERATIVE CHOLANGIOGRAM;  Surgeon: Atilano Ina, MD;  Location: [facility] OR;  Service: General;  Laterality: N/A;    COLONOSCOPY     ERCP N/A 07/29/2012   Procedure: ENDOSCOPIC RETROGRADE CHOLANGIOPANCREATOGRAPHY (ERCP);  Surgeon: Theda Belfast, MD;  Location: [facility];  Service: Endoscopy;  Laterality: N/A;  Dr. Elnoria Howard said he would start this PT arond 1330( AW)   INCISION AND DRAINAGE ABSCESS Right 12/02/2015   Procedure: INCISION AND DRAINAGE ABSCESS;  Surgeon: Jimmye Norman, MD;  Location[facility] OR;  Service: General;  Laterality: Right;  Right axillary abscess   TUBAL LIGATION  1995   Social History   Social History Narrative   Pt lives with spouse. Some college.    Works 7 days on then 7 days off at place of work       Patient is right-handed. She lives with her husband in a one level home. She occasionally drinks soda. She does not exercise.    Immunization History  Administered Date(s) Administered   Influenza,inj,Quad PF,6+ Mos 01/09/2013, 02/21/2015, 02/07/2016, 01/09/2017, 02/18/2018, 01/25/2020, 01/06/2021, 01/16/2022   PFIZER(Purple Top)SARS-COV-2 Vaccination 05/11/2019, 06/05/2019, 02/13/2020   PNEUMOCOCCAL CONJUGATE-20 01/30/2021   PPD Test 09/02/2012, 11/03/2013, 10/08/2014, 11/28/2015, 02/25/2017, 02/18/2018, 01/25/2020, 01/30/2021, 04/09/2022   Pfizer Covid-19 Vaccine Bivalent Booster 60yrs & up 05/22/2021   Pfizer(Comirnaty)Fall Seasonal Vaccine 12 years and older 04/09/2022   Tdap 09/02/2012     Objective: Vital Signs: BP (!) 125/92 (BP Location: Left Arm, Patient Position: Sitting, Cuff Size: Normal)   Pulse (!) 125   Resp 17   Ht 5\' 4"  (1.626 m)   Wt 243 lb 12.8 oz (110.6 kg)   BMI 41.85 kg/m    Physical Exam Vitals and nursing note reviewed.  Constitutional:      Appearance: She is well-developed.  HENT:     Head: Normocephalic and atraumatic.  Eyes:     Conjunctiva/sclera: Conjunctivae normal.  Cardiovascular:     Rate and Rhythm: Normal rate and regular rhythm.     Heart sounds: Normal heart sounds.  Pulmonary:     Effort: Pulmonary effort is normal.     Breath  sounds: Normal breath sounds.  Abdominal:     General: Bowel sounds are normal.     Palpations: Abdomen is soft.  Musculoskeletal:     Cervical back: Normal range of motion.  Lymphadenopathy:     Cervical: No cervical adenopathy.  Skin:    General: Skin is warm and dry.     Capillary Refill: Capillary refill takes less than 2 seconds.  Neurological:     Mental Status: She is alert and oriented to person, place, and time.  Psychiatric:        Behavior: Behavior normal.      Musculoskeletal Exam: Cervical spine was in good range of motion.  She had no tenderness over thoracic or lumbar spine.  Shoulders, elbows, wrist joints, MCPs PIPs and DIPs with good range of motion with no synovitis.  Hip joints and knee joints in good range of motion without any warmth swelling or effusion.  There was no tenderness over ankles or MTPs.  CDAI Exam: CDAI Score: -- Patient Global: --; Provider  Global: -- Swollen: --; Tender: -- Joint Exam 02/08/2023   No joint exam has been documented for this visit   There is currently no information documented on the homunculus. Go to the Rheumatology activity and complete the homunculus joint exam.  Investigation: No additional findings.  Imaging: XR Foot 2 Views Left  Result Date: 01/11/2023 First MTP, PIP and DIP narrowing was noted.  Possible ankylosis of second, third and fourth DIP joints was noted.  No intertarsal, tibiotalar or subtalar joint space narrowing was noted.  Inferior and posterior calcaneal spurs were noted.  No erosive changes were noted. Impression: These findings suggestive of osteoarthritis of the foot.  XR Foot 2 Views Right  Result Date: 01/11/2023 First MTP, PIP and DIP narrowing was noted.  Possible ankylosis of second, third and fourth DIP joints was noted.  No intertarsal, tibiotalar or subtalar joint space narrowing was noted.  Posterior calcaneal spur was noted.  No erosive changes were noted. Impression: These findings  suggestive of osteoarthritis of the foot.  XR KNEE 3 VIEW LEFT  Result Date: 01/11/2023 Moderate medial compartment narrowing with medial osteophytes were noted.  Moderate patellofemoral narrowing was noted.  No chondrocalcinosis was noted. Impression: These findings are suggestive of moderate osteoarthritis and moderate chondromalacia patella.  XR KNEE 3 VIEW RIGHT  Result Date: 01/11/2023 Moderate medial compartment narrowing with medial and intercondylar osteophytes were noted.  Mild patellofemoral narrowing was noted. Impression: These findings are suggestive of moderate osteoarthritis and mild chondromalacia patella.  XR Hand 2 View Left  Result Date: 01/11/2023 [facility] and PIP  narrowing was noted.  No MCP, intercarpal or radiocarpal joint space narrowing was noted.  No erosive changes were noted. Impression: These findings are suggestive of osteoarthritis of the hand.  XR Hand 2 View Right  Result Date: 01/11/2023 [facility] and PIP  narrowing was noted.  No MCP, intercarpal or radiocarpal joint space narrowing was noted.  No erosive changes were noted. Impression: These findings are suggestive of osteoarthritis of the hand.   Recent Labs: Lab Results  Component Value Date   WBC 5.5 01/11/2023   HGB 12.6 01/11/2023   PLT 344 01/11/2023   NA 138 01/11/2023   K 3.9 01/11/2023   CL 102 01/11/2023   CO2 28 01/11/2023   GLUCOSE 85 01/11/2023   BUN 20 01/11/2023   CREATININE 1.06 (H) 01/11/2023   BILITOT 0.5 01/11/2023   ALKPHOS 61 08/03/2022   AST 24 01/11/2023   ALT 23 01/11/2023   PROT 8.0 01/11/2023   ALBUMIN 4.1 08/03/2022   CALCIUM 9.8 01/11/2023   GFRAA 74 04/15/2020   January 11, 2023 ANA 1: 320 NS, 1: 320 cytoplasmic, RNP 2.6, SSA> 8.0, SSB negative, dsDNA negative, Smith negative, SCL 70 negative, beta-2 GP 1 negative, anticardiolipin IgA 22.2, lupus anticoagulant negative, ESR 6, RF negative, anti-CCP negative, uric acid 4.0, vitamin D 34, TSH 1.54,  CK247  Speciality  Comments: Plaquenil since 2000, methotrexate-GI side effects and fatigue  PLQ eye exam scheduled for November 2024 per patient.  Procedures:  No procedures performed Allergies: Amlodipine and Tessalon [benzonatate]   Assessment / Plan:     Visit Diagnoses: Other systemic lupus erythematosus with other organ involvement (HCC) - + ANA, +Ro, + RNP (previously +dsDNA and +RF)h/o arthritis, malar rash, pleural effusion.dxd 1998 Dr. Kellie Simmering later [name] and Mr. Fredderick Phenix. -She gives history of dry mouth.  Patient denies history of malar rash, photosensitivity, Raynaud's phenomenon or lymphadenopathy.  She had no inflammatory arthritis.  Recent labs  as described below showed positive ANA, positive RNP, positive SSA and anticardiolipin IgA which was borderline positive.  Lab results were discussed with the patient.  Patient denies any history of shortness of breath or palpitations.  Association of SSA antibody with arrhythmias was discussed.  Association of RNP and SSA antibody with ILD was discussed.  There is no history of parotid swelling or lymphadenopathy.  Will check complements and urine protein creatinine ratio.  Plan: C3 and C4, Protein / creatinine ratio, urine.  Will continue current treatment.  She was advised to contact us if she develops any new symptoms.  High risk medication use - Plaquenil 200 mg p.o. twice daily since 2000.  Eye examination Dr. Harlon Flor 2 years ago.  Eye examination is pending for November 2024.  Methotrexate-discontinued due to side effects.  Pleuritis - 1998 and [id number].  Treated with prednisone for about 3 years by Dr. Kellie Simmering in 1998.  Pain in both hands -she complains of ongoing pain and discomfort in the bilateral hands.  No synovitis was noted on the examination.  X-rays obtained at the last visit revealed osteoarthritic changes.  X-ray of knees were reviewed with the patient.  Trochanteric bursitis left hip-she has been experiencing pain and discomfort in her left  trochanteric bursa.  A handout on IT band stretches was given.  If she has persistent symptoms we consider cortisone injection in the future.  Chronic pain of both knees -she continues to have discomfort in her knee joints.  No warmth swelling or effusion was noted today.  X-rays obtained at the last visit showed moderate osteoarthritis and chondromalacia patella.  X-ray of knees were reviewed with the patient.  Pain in both feet -she complains of discomfort in the bilateral feet.  No synovitis was noted.  X-rays obtained at the last visit showed degenerative changes.  X-ray findings were reviewed with the patient.  G6PD deficiency  Carpal tunnel syndrome, right upper limb - Status post left carpal tunnel release.  Fibromyalgia - Diagnosed by [name].  She takes meloxicam and uses topical Pennsaid.  She continues to have generalized pain and discomfort.  Need for regular exercise and stretching was emphasized.  Elevated CK -her CK was elevated mildly.  I will repeat CK and additional labs.  She takes her statins.  Plan: CK, Aldolase, 3-Hydroxy-3-Methylglutaryl-Coenzyme A Reductase (HMGCR) AB (IgG), Myositis Specific II Antibodies Panel  Other fatigue-related to fibromyalgia and insomnia.  Other insomnia-good sleep hygiene was discussed.  Other medical problems are listed as follows:  Dyslipidemia  Prediabetes  Essential hypertension, benign-blood pressure was elevated at 145/77.  Repeat blood pressure was 125/92.  She was advised to monitor blood pressure closely and follow-up with the PCP.  Atherosclerosis of aorta (HCC)  Esophageal dysphagia  Class 2 severe obesity due to excess calories with serious comorbidity and body mass index (BMI) of 39.0 to 39.9 in adult [facility])  Mood disorder (HCC)  Vitamin D deficiency-vitamin D was low normal at 34.  She was advised to stay on vitamin D.  Family history of multiple sclerosis-Sister   Orders: Orders Placed This Encounter   Procedures   CK   Aldolase   3-Hydroxy-3-Methylglutaryl-Coenzyme A Reductase (HMGCR) AB (IgG)   Myositis Specific II Antibodies Panel   C3 and C4   Protein / creatinine ratio, urine   No orders of the defined types were placed in this encounter.   Follow-Up Instructions: Return in about 3 months (around 05/11/2023) for Systemic lupus, Osteoarthritis.   Pollyann Savoy, MD  Note - This record has been created using [employer].  Chart creation errors have been sought, but may not always  have been located. Such creation errors do not reflect on  the standard of medical care.

## 2023-02-05 ENCOUNTER — Ambulatory Visit: Payer: 59 | Admitting: Licensed Clinical Social Worker

## 2023-02-05 DIAGNOSIS — F411 Generalized anxiety disorder: Secondary | ICD-10-CM | POA: Diagnosis not present

## 2023-02-05 DIAGNOSIS — F331 Major depressive disorder, recurrent, moderate: Secondary | ICD-10-CM | POA: Diagnosis not present

## 2023-02-05 NOTE — Progress Notes (Signed)
[facility] Counselor/Therapist Progress Note  Patient ID: CAMERIN JIMENEZ, MRN: [id number]    Date: 02/05/23  Time Spent: 0806  am - 0900 am : 54 Minutes  Treatment Type: Individual Therapy.  Reported Symptoms: Symptoms of depression, agitation and stress.  Mental Status Exam: Appearance:  Casual     Behavior: Appropriate  Motor: Normal  Speech/Language:  Clear and Coherent  Affect: Appropriate  Mood: normal  Thought process: normal  Thought content:   WNL  Sensory/Perceptual disturbances:   WNL  Orientation: oriented to person, place, time/date, situation, day of week, month of year, and year  Attention: Good  Concentration: Good  Memory: WNL  Fund of knowledge:  Good  Insight:   Good  Judgment:  Good  Impulse Control: Good   Risk Assessment: Danger to Self:  No Self-injurious Behavior: No Danger to Others: No Duty to Warn:no Physical Aggression / Violence:No  Access to Firearms a concern: No  Gang Involvement:No   Subjective:   Veronica [name] participated from office located at [facility] with Clinician present. Veronica [name] consented to treatment.    Veronica [name] presented for her session identifying her work stress.  Patient reported several changes at her work place of 16 years that has affected both her work routine as well as the clients. Patient identified that the owner let her home go and moved into the basement of the group home. She reports that the grandchildren of the owner are always upstairs and running around. Patient also shared that her husband has recently lost his job which is an added stressor. Clinician encouraged patient to be transparent with her employer about how this has affected her as well as the clients in the home. Clinician and patient processed problem solving for her current situation and how this could possibly be resolved within there home.  Interventions: Cognitive Behavioral Therapy and Solution-Oriented/Positive  Psychology  Diagnosis: Generalized anxiety disorder, Major Depressive Disorder, moderate   Plan: Veronica [name] is to use CBT, mindfulness and coping skills to help manage decrease symptoms associated with their diagnosis.   Long-term goal:   Veronica [name] will reduce overall level, frequency, and intensity of the feelings of depression and anxiety evidenced by decreased irritability, negative self talk, and helpless feelings from 6 to 7 days/week to 0 to 2 days/week per client report for at least 3 consecutive months. Treatment plan to be reviewed by 01/08/2024.  Short-term goal:  Veronica [name] will verbally express understanding of the relationship between feelings of depression, anxiety and their impact on thinking patterns and behaviors. Verbalize an understanding of the role that distorted thinking plays in creating fears, excessive worry, and ruminations.  Phyllis Ginger MSW, LCSW DATE:02/05/2023

## 2023-02-08 ENCOUNTER — Ambulatory Visit: Payer: 59 | Attending: Rheumatology | Admitting: Rheumatology

## 2023-02-08 ENCOUNTER — Encounter: Payer: Self-pay | Admitting: Rheumatology

## 2023-02-08 VITALS — BP 125/92 | HR 125 | Resp 17 | Ht 64.0 in | Wt 243.8 lb

## 2023-02-08 DIAGNOSIS — M79672 Pain in left foot: Secondary | ICD-10-CM

## 2023-02-08 DIAGNOSIS — M79641 Pain in right hand: Secondary | ICD-10-CM | POA: Diagnosis not present

## 2023-02-08 DIAGNOSIS — G4709 Other insomnia: Secondary | ICD-10-CM

## 2023-02-08 DIAGNOSIS — E559 Vitamin D deficiency, unspecified: Secondary | ICD-10-CM

## 2023-02-08 DIAGNOSIS — G8929 Other chronic pain: Secondary | ICD-10-CM

## 2023-02-08 DIAGNOSIS — M3219 Other organ or system involvement in systemic lupus erythematosus: Secondary | ICD-10-CM

## 2023-02-08 DIAGNOSIS — M7062 Trochanteric bursitis, left hip: Secondary | ICD-10-CM

## 2023-02-08 DIAGNOSIS — M79642 Pain in left hand: Secondary | ICD-10-CM

## 2023-02-08 DIAGNOSIS — M25562 Pain in left knee: Secondary | ICD-10-CM

## 2023-02-08 DIAGNOSIS — E66812 Obesity, class 2: Secondary | ICD-10-CM

## 2023-02-08 DIAGNOSIS — M797 Fibromyalgia: Secondary | ICD-10-CM

## 2023-02-08 DIAGNOSIS — R1319 Other dysphagia: Secondary | ICD-10-CM

## 2023-02-08 DIAGNOSIS — I700 Atherosclerosis of aorta: Secondary | ICD-10-CM

## 2023-02-08 DIAGNOSIS — D75A Glucose-6-phosphate dehydrogenase (G6PD) deficiency without anemia: Secondary | ICD-10-CM

## 2023-02-08 DIAGNOSIS — R5383 Other fatigue: Secondary | ICD-10-CM

## 2023-02-08 DIAGNOSIS — R091 Pleurisy: Secondary | ICD-10-CM

## 2023-02-08 DIAGNOSIS — Z820 Family history of epilepsy and other diseases of the nervous system: Secondary | ICD-10-CM

## 2023-02-08 DIAGNOSIS — R7303 Prediabetes: Secondary | ICD-10-CM

## 2023-02-08 DIAGNOSIS — F39 Unspecified mood [affective] disorder: Secondary | ICD-10-CM

## 2023-02-08 DIAGNOSIS — R748 Abnormal levels of other serum enzymes: Secondary | ICD-10-CM

## 2023-02-08 DIAGNOSIS — M79671 Pain in right foot: Secondary | ICD-10-CM

## 2023-02-08 DIAGNOSIS — Z79899 Other long term (current) drug therapy: Secondary | ICD-10-CM

## 2023-02-08 DIAGNOSIS — I10 Essential (primary) hypertension: Secondary | ICD-10-CM

## 2023-02-08 DIAGNOSIS — Z6839 Body mass index (BMI) 39.0-39.9, adult: Secondary | ICD-10-CM

## 2023-02-08 DIAGNOSIS — M25561 Pain in right knee: Secondary | ICD-10-CM

## 2023-02-08 DIAGNOSIS — E785 Hyperlipidemia, unspecified: Secondary | ICD-10-CM

## 2023-02-08 DIAGNOSIS — G5601 Carpal tunnel syndrome, right upper limb: Secondary | ICD-10-CM

## 2023-02-08 NOTE — Patient Instructions (Addendum)
Vaccines You are taking a medication(s) that can suppress your immune system.  The following immunizations are recommended: Flu annually Covid-19  RSV Td/Tdap (tetanus, diphtheria, pertussis) every 10 years Pneumonia (Prevnar 15 then Pneumovax 23 at least 1 year apart.  Alternatively, can take Prevnar 20 without needing additional dose) Shingrix: 2 doses from 4 weeks to 6 months apart  Please check with your PCP to make sure you are up to date.  Iliotibial Band Syndrome Rehab Ask your health care provider which exercises are safe for you. Do exercises exactly as told by your provider and adjust them as told. It's normal to feel mild stretching, pulling, tightness, or discomfort as you do these exercises. Stop right away if you feel sudden pain or your pain gets a lot worse. Do not begin these exercises until told by your provider. Stretching and range-of-motion exercises These exercises warm up your muscles and joints. They also improve the movement and flexibility of your hip and pelvis. Quadriceps stretch, prone  Lie face down (prone) on a firm surface like a bed or padded floor. Bend your left / right knee. Reach back to hold your ankle or pant leg. If you can't reach your ankle or pant leg, use a belt looped around your foot and grab the belt instead. Gently pull your heel toward your butt. Your knee should not slide out to the side. You should feel a stretch in the front of your thigh and knee, also called the quadriceps. Hold this position for __________ seconds. Repeat __________ times. Complete this exercise __________ times a day. Iliotibial band stretch The iliotibial band is a strip of tissue that runs along the outside of your hip down to your knee. Lie on your side with your left / right leg on top. Bend both knees and grab your left / right ankle. Stretch out your bottom arm to help you balance. Slowly bring your top knee back so your thigh goes behind your back. Slowly lower  your top leg toward the floor until you feel a gentle stretch on the outside of your left / right hip and thigh. If you don't feel a stretch and your knee won't go farther, place the heel of your other foot on top of your knee and pull your knee down toward the floor with your foot. Hold this position for __________ seconds. Repeat __________ times. Complete this exercise __________ times a day. Strengthening exercises These exercises build strength and endurance in your hip and pelvis. Endurance means your muscles can keep working even when they're tired. Straight leg raises, side-lying This exercise strengthens the muscles that rotate the leg at the hip and move it away from your body. These muscles are called hip abductors. Lie on your side with your left / right leg on top. Lie so your head, shoulder, hip, and knee line up. You can bend your bottom knee to help you balance. Roll your hips slightly forward so they're stacked directly over each other. Your left / right knee should face forward. Tense the muscles in your outer thigh and hip. Lift your top leg 4-6 inches (10-15 cm) off the ground. Hold this position for __________ seconds. Slowly lower your leg back down to the starting position. Let your muscles fully relax before doing this exercise again. Repeat __________ times. Complete this exercise __________ times a day. Leg raises, prone This exercise strengthens the muscles that move the hips backward. These muscles are called hip extensors. Lie face down (prone) on  your bed or a firm surface. You can put a pillow under your hips for comfort and to support your lower back. Bend your left / right knee so your foot points straight up toward the ceiling. Keep the other leg straight and behind you. Squeeze your butt muscles. Lift your left / right thigh off the firm surface. Do not let your back arch. Tense your thigh muscle as hard as you can without having more knee pain. Hold this  position for __________ seconds. Slowly lower your leg to the starting position. Allow your leg to relax all the way. Repeat __________ times. Complete this exercise __________ times a day. Hip hike  Stand sideways on a bottom step. Place your feet so that your left / right leg is on the step, and the other foot is hanging off the side. If you need support for balance, hold onto a railing or wall. Keep your knees straight and your abdomen square, meaning your hips are level. Then, lift your left / right hip up toward the ceiling. Slowly let your leg that's hanging off the step lower towards the floor. Your foot should get closer to the ground. Do not lean or bend your knees during this movement. Repeat __________ times. Complete this exercise __________ times a day. This information is not intended to replace advice given to you by your health care provider. Make sure you discuss any questions you have with your health care provider. Document Revised: 06/29/2022 Document Reviewed: 06/29/2022 Elsevier Patient Education  2024 [employer].

## 2023-02-13 ENCOUNTER — Other Ambulatory Visit: Payer: Self-pay | Admitting: Licensed Clinical Social Worker

## 2023-02-13 NOTE — Patient Instructions (Signed)
Visit Information  Veronica [name] was given information about Medicaid Managed Care team care coordination services as a part of their [employer] Medicaid benefit. Veronica [name] verbally consented to engagement with the [facility] Managed Care team but is agreeable to case closure at this time due to all needs being met.   If you are experiencing a medical emergency, please call 911 or report to your local emergency department or urgent care.   If you have a non-emergency medical problem during routine business hours, please contact your provider's office and ask to speak with a nurse.   For questions related to your [employer] [facility] health plan, please call: [phone]  OR visit the member homepage at: [email or web address]  If you would like to schedule transportation through your [facility] plan, please call the following number at least 2 days in advance of your appointment: [phone]  If you are experiencing a behavioral health crisis, call the [facility] Crisis Line at [phone]). The line is available 24 hours a day, seven days a week.  If you would like help to quit smoking, call 1-800-QUIT-NOW (928-547-8414) OR Espaol: 1-855-Djelo-Ya (6-644-034-7425) o para ms informacin haga clic aqu or Text READY to 956-387 to register via text   Following is a copy of your plan of care:  Care Plan : General Social Work (Adult)  Updates made by Gustavus Bryant, LCSW since 02/13/2023 12:00 AM     Problem: Emotional Distress      Goal: Emotional Health Supported by connecting for ongoing Therapy   Start Date: 02/02/2021  Expected End Date: 04/29/2021  Recent Progress: On track  Priority: High  Note:   Priority: High  Timeframe:  Short-Range Goal Priority:  High Start Date:   10/23/22           Expected End Date:  02/13/23- goal closed  after being successfully met                    Current Barriers:  Patient has not been able to connect to counseling/resources provided Disease Management support and education needs related to Mood Instability  CSW Clinical Goal(s):  Patient  will contact providers discussed today and will make decision on where she wishes to gain therapy  Patient Goals/Self-Care Activities: Over the next 90 days Attend scheduled medical appointments Utilize healthy coping skills and supportive resources discussed Contact PCP with any questions or concerns Keep 90 percent of counseling appointments Call your insurance provider for more information about your Enhanced Benefits  Check out counseling resources provided  Begin personal counseling with LCSW, to reduce and manage symptoms of Depression and Stress, until well-established with mental health provider Incorporate into daily practice - relaxation techniques, deep breathing exercises, and mindfulness meditation strategies. Talk about feelings with friends, family members, spiritual advisor, etc. Contact LCSW directly [phone]), if you have questions, need assistance, or if additional social work needs are identified between now and our next scheduled telephone outreach call. Call 988 for mental health hotline/crisis line if needed (24/7 available) Try techniques to reduce symptoms of anxiety/negative thinking (deep breathing, distraction, positive self talk, etc)  - develop a personal safety plan - develop a plan to deal with triggers like holidays, anniversaries - exercise at least 2 to 3 times per week - have a plan for how to handle bad days - journal feelings and what helps to feel better or worse - spend time or talk with others  at least 2 to 3 times per week - watch for early signs of feeling worse - begin personal counseling - call and visit an old friend - check out volunteer opportunities - join a support group - laugh; watch a  funny movie or comedian - learn and use visualization or guided imagery - perform a random act of kindness - practice relaxation or meditation daily - start or continue a personal journal - practice positive thinking and self-talk -continue with compliance of taking medication  -identify current effective and ineffective coping strategies.  -implement positive self-talk in care to increase self-esteem, confidence and feelings of control.  -consider alternative and complementary therapy approaches such as meditation, mindfulness or yoga.  -journaling, prayer, worship services, meditation or pastoral counseling.  -increase participation in pleasurable group activities such as hobbies, singing, sports or volunteering).  -consider the use of meditative movement therapy such as tai chi, yoga or qigong.  -start a regular daily exercise program based on tolerance, ability and patient choice to support positive thinking and activity    If you are experiencing a Mental Health or Behavioral Health Crisis or need someone to talk to, please call the Suicide and Crisis Lifeline: 107   Dickie La, BSW, MSW, [employer] Managed Medicaid LCSW [facility]  [email or web address] Phone: [phone]

## 2023-02-13 NOTE — Patient Outreach (Addendum)
Medicaid Managed Care Social Work Note  02/13/2023 Name:  Veronica [name] MRN:  [id number] DOB:  [birth year]  Veronica [name] is an 62 y.o. year old female who is a primary patient of Westley Chandler, MD.  The Medicaid Managed Care Coordination team was consulted for assistance with:  Mental Health Counseling and Resources  Ms. Prioleau was given information about Medicaid Managed Care Coordination team services today. Veronica [name] Patient agreed to services and verbal consent obtained.  Engaged with patient  for by telephone forfollow up visit in response to referral for case management and/or care coordination services.   Assessments/Interventions:  Review of past medical history, allergies, medications, health status, including review of consultants reports, laboratory and other test data, was performed as part of comprehensive evaluation and provision of chronic care management services.  SDOH: (Social Determinant of Health) assessments and interventions performed: SDOH Interventions    Flowsheet Row Patient Outreach Telephone from 02/13/2023 in [place] POPULATION HEALTH DEPARTMENT Patient Outreach Telephone from 01/18/2023 in [place] POPULATION HEALTH DEPARTMENT Patient Outreach Telephone from 01/04/2023 in [place] POPULATION HEALTH DEPARTMENT Patient Outreach Telephone from 12/19/2022 in [place] POPULATION HEALTH DEPARTMENT Community Documentation from 12/08/2022 in [facility] Patient Outreach Telephone from 12/06/2022 in  POPULATION HEALTH DEPARTMENT  SDOH Interventions        Food Insecurity Interventions -- -- -- -- Intervention Not Indicated --  Utilities Interventions -- -- -- -- Intervention Not Indicated --  Stress Interventions Offered [employer], Provide Counseling  [Receives ongoing therapy] [employer], Provide Counseling Provide Counseling, Offered [facility]  [place] upcoming  initial therapy session on 9/10] Offered [employer], Provide Counseling -- Offered [employer], Provide Counseling  [Still in need of establishing care with MH providers]       Advanced Directives Status:  See Care Plan for related entries.  Care Plan                 Allergies  Allergen Reactions   Amlodipine Swelling    Leg edema   Tessalon [Benzonatate] Other (See Comments)    Lip swelling    Medications Reviewed Today   Medications were not reviewed in this encounter     Patient Active Problem List   Diagnosis Date Noted   Major depressive disorder, recurrent episode, moderate (HCC) 01/08/2023   Class 2 severe obesity due to excess calories with serious comorbidity and body mass index (BMI) of 39.0 to 39.9 in adult [facility]) 12/20/2022   Esophageal dysphagia 04/15/2020   Mood disorder (HCC) 11/13/2019   Atherosclerosis of aorta (HCC) 07/15/2019   Prediabetes 07/13/2019   Chronic abdominal pain 07/13/2019   Generalized anxiety disorder 05/15/2019   Insomnia 12/15/2012   Essential hypertension, benign 04/10/2012   SLE (systemic lupus erythematosus) (HCC) 04/10/2012   Care Plan : General Social Work (Adult)  Updates made by Gustavus Bryant, LCSW since 02/13/2023 12:00 AM     Problem: Emotional Distress      Goal: Emotional Health Supported by connecting for ongoing Therapy   Start Date: 02/02/2021  Expected End Date: 04/29/2021  Recent Progress: On track  Priority: High  Note:   Priority: High  Timeframe:  Short-Range Goal Priority:  High Start Date:   10/23/22           Expected End Date:  02/13/23- goal closed after being successfully met  Current Barriers:  Patient has not been able to connect to counseling/resources provided Disease Management support and education needs related to Mood Instability  CSW Clinical Goal(s):  Patient  will contact providers discussed today and will make decision on where she  wishes to gain therapy  Interventions: Inter-disciplinary care team collaboration (see longitudinal plan of care) Evaluation of current treatment plan related to  self management and patient's adherence to plan as established by provider Establishing healthy boundaries emphasized and healthy self-care education provided Patient was educated on available mental health resources within their area that accept Medicaid and offer counseling and psychiatry.  Patient educated on the difference between therapy and psychiatry per patient request Email sent to patient today with available mental health resources within her area that accept Medicaid and offer the services that she is interested in. Email included instructions for scheduling at [facility] as well as some crisis support resources and [facility]'s walk in clinic hours. Patient will review resources and make a decision regarding where she wishes to gain MH treatment at. Patient will work on implementing appropriate self-care habits into their daily routine such as: staying positive, writing a gratitude list, drinking water, staying active around the house, taking their medications as prescribed, combating negative thoughts or emotions and staying connected with their family and friends. Positive reinforcement provided for this decision to work on this. Motivational Interviewing employed Depression screen reviewed  PHQ2/ PHQ9 completed or reviewed  Mindfulness or Relaxation training provided Active listening / Reflection utilized  Advance Care and HCPOA education provided Emotional Support Provided Problem Solving /Task Center strategies reviewed Provided psychoeducation for mental health needs  Provided brief CBT  Reviewed mental health medications and discussed importance of compliance:  Quality of sleep assessed & Sleep Hygiene techniques promoted  Participation in counseling encouraged  Verbalization of feelings encouraged  Suicidal  Ideation/Homicidal Ideation assessed: Patient denies SI/HI  Review resources, discussed options and provided patient information about  Mental Health Resources Inter-disciplinary care team collaboration (see longitudinal plan of care) Update- Patient has an upcoming initial appointment for therapy with [facility] and is unsure of the address location so [facility] LCSW completed joint phone call to practice and was able to retrieve that needed information for the patient. Patient has already completed the electronic intake paperwork and only needs to show up for appointment. Patient was appreciative of this care coordination and support. 01/18/23 update- Patient reports that her first initial therapy appointment went very well. [facility] LCSW will make one final follow up call next month to ensure all of her social work needs have been met. She reports that today is her birthday and she will be practicing a lot of self-care to celebrate. Brief stress management education provided to patient. Positive reinforcement provided for recent positive self-care implementation she has put into place in her daily schedule. Discharge update- Patient declines any further social work needs at this time as she is now established with a long term therapist within [facility]. She shares that she has a therapy appointment next week on 02/21/23. Patient is agreeable to case closure at this time as she has no further case management needs. She was advised to contact this [facility] LCSW at anytime in the future if needs arise.   Patient Goals/Self-Care Activities: Over the next 90 days Attend scheduled medical appointments Utilize healthy coping skills and supportive resources discussed Contact PCP with any questions or concerns Keep 90 percent of counseling appointments Call your insurance provider for more information  about your Enhanced Benefits  Check out counseling resources provided  Begin personal counseling with LCSW, to  reduce and manage symptoms of Depression and Stress, until well-established with mental health provider Incorporate into daily practice - relaxation techniques, deep breathing exercises, and mindfulness meditation strategies. Talk about feelings with friends, family members, spiritual advisor, etc. Contact LCSW directly [phone]), if you have questions, need assistance, or if additional social work needs are identified between now and our next scheduled telephone outreach call. Call 988 for mental health hotline/crisis line if needed (24/7 available) Try techniques to reduce symptoms of anxiety/negative thinking (deep breathing, distraction, positive self talk, etc)  - develop a personal safety plan - develop a plan to deal with triggers like holidays, anniversaries - exercise at least 2 to 3 times per week - have a plan for how to handle bad days - journal feelings and what helps to feel better or worse - spend time or talk with others at least 2 to 3 times per week - watch for early signs of feeling worse - begin personal counseling - call and visit an old friend - check out volunteer opportunities - join a support group - laugh; watch a funny movie or comedian - learn and use visualization or guided imagery - perform a random act of kindness - practice relaxation or meditation daily - start or continue a personal journal - practice positive thinking and self-talk -continue with compliance of taking medication  -identify current effective and ineffective coping strategies.  -implement positive self-talk in care to increase self-esteem, confidence and feelings of control.  -consider alternative and complementary therapy approaches such as meditation, mindfulness or yoga.  -journaling, prayer, worship services, meditation or pastoral counseling.  -increase participation in pleasurable group activities such as hobbies, singing, sports or volunteering).  -consider the use of  meditative movement therapy such as tai chi, yoga or qigong.  -start a regular daily exercise program based on tolerance, ability and patient choice to support positive thinking and activity    If you are experiencing a Mental Health or Behavioral Health Crisis or need someone to talk to, please call the Suicide and Crisis Lifeline: 988    Patient Goals: Follow up goal      Follow up:  Patient requests no follow-up at this time.  Plan: The Managed Medicaid care management team is available to follow up with the patient after provider conversation with the patient regarding recommendation for care management engagement and subsequent re-referral to the care management team.   Dickie La, BSW, MSW, LCSW Managed Medicaid LCSW [facility]  [facility] [email or web address] Phone: [phone]

## 2023-02-14 ENCOUNTER — Ambulatory Visit (INDEPENDENT_AMBULATORY_CARE_PROVIDER_SITE_OTHER): Payer: 59 | Admitting: Family Medicine

## 2023-02-15 LAB — CK: Total CK: 155 U/L — ABNORMAL HIGH (ref 29–143)

## 2023-02-15 LAB — MYOSITIS SPECIFIC II ANTIBODIES PANEL
EJ AB: <11 SI (ref <11)
JO-1 AB: <11 SI (ref <11)
MDA-5 AB: <11 SI (ref <11)
MI-2 ALPHA AB: <11 SI (ref <11)
MI-2 BETA AB: <11 SI (ref <11)
NXP-2 AB: <11 SI (ref <11)
OJ AB: <11 SI (ref <11)
PL-12 AB: <11 SI (ref <11)
PL-7 AB: <11 SI (ref <11)
SRP-AB: <11 SI (ref <11)
TIF-1y AB: <11 SI (ref <11)

## 2023-02-15 LAB — C3 AND C4
C3 Complement: 138 mg/dL (ref 83–193)
C4 Complement: 21 mg/dL (ref 15–57)

## 2023-02-15 LAB — HMGCR AB (IGG): HMGCR AB (IGG): <2 CU (ref <20)

## 2023-02-15 LAB — PROTEIN / CREATININE RATIO, URINE
Creatinine, Urine: 40 mg/dL (ref 20–275)
Total Protein, Urine: <4 mg/dL — ABNORMAL LOW (ref 5–24)

## 2023-02-15 LAB — ALDOLASE: Aldolase: 3.6 U/L (ref <=8.1)

## 2023-02-15 NOTE — Progress Notes (Signed)
Myositis panel negative, HMG CR antibody negative, complements normal, CK mildly elevated, aldolase normal, urine protein creatinine ratio normal.  Labs do not indicate myositis.

## 2023-02-21 ENCOUNTER — Ambulatory Visit: Payer: 59 | Admitting: Licensed Clinical Social Worker

## 2023-02-21 DIAGNOSIS — F411 Generalized anxiety disorder: Secondary | ICD-10-CM | POA: Diagnosis not present

## 2023-02-21 NOTE — Progress Notes (Signed)
[facility] Counselor/Therapist Progress Note  Patient ID: Veronica [name], MRN: [id number]    Date: 02/21/23  Time Spent: 1002  am - 1100 am : 58 Minutes  Treatment Type: Individual Therapy.  Reported Symptoms: Anxiety and stress  Mental Status Exam: Appearance:  Casual     Behavior: Appropriate  Motor: Normal  Speech/Language:  Clear and Coherent  Affect: Appropriate  Mood: normal  Thought process: normal  Thought content:   WNL  Sensory/Perceptual disturbances:   WNL  Orientation: oriented to person, place, time/date, situation, day of week, month of year, and year  Attention: Good  Concentration: Good  Memory: WNL  Fund of knowledge:  Good  Insight:   Good  Judgment:  Good  Impulse Control: Good   Risk Assessment: Danger to Self:  No Self-injurious Behavior: No Danger to Others: No Duty to Warn:no Physical Aggression / Violence:No  Access to Firearms a concern: No  Gang Involvement:No   Subjective:   Veronica [name] participated from office, located at [employer] with Clinician present. Veronica [name] consented to treatment.   Veronica [name] presented for her session and identified her most recent stressor. Patient shared that her parents are staying with she and her husband for 10 days while they are having some renovations on their  home. Patient reports that her husband is upset and complains about it. Patient shared that her husband continues to ask for money once he goes through his to gamble and drink. Clinician and patient processed the importance of a conversation setting boundaries and discussing with her spouse the priorities and future goals. Patient reports that she plans to have this conversation in order to improve her life whether he wants to be on the page with her or if they must go their separate ways.   Interventions: Cognitive Behavioral Therapy and Assertiveness/Communication  Diagnosis: Generalized Anxiety Didsorder   Plan: Veronica [name] is to  use CBT, mindfulness and coping skills to help manage decrease symptoms associated with their diagnosis.   Long-term goal:   Veronica [name] will reduce overall level, frequency, and intensity of the feelings of anxiety  evidenced by decreased irritability, negative self talk, and helpless feelings from 6 to 7 days/week to 0 to 2 days/week per client report for at least 3 consecutive months. Treatment plan to be reviewed by 01/08/2024.  Short-term goal:  Veronica [name] will verbally express understanding of the relationship between feelings of anxiety and their impact on thinking patterns and behaviors. Verbalize an understanding of the role that distorted thinking plays in creating fears, excessive worry, and ruminations.  Veronica [name] MSW, LCSW DATE:02/21/2023

## 2023-02-28 ENCOUNTER — Encounter (INDEPENDENT_AMBULATORY_CARE_PROVIDER_SITE_OTHER): Payer: Self-pay | Admitting: Family Medicine

## 2023-02-28 ENCOUNTER — Ambulatory Visit (INDEPENDENT_AMBULATORY_CARE_PROVIDER_SITE_OTHER): Payer: 59 | Admitting: Family Medicine

## 2023-02-28 VITALS — BP 117/77 | HR 61 | Temp 98.5°F | Ht 64.0 in | Wt 236.0 lb

## 2023-02-28 DIAGNOSIS — R5383 Other fatigue: Secondary | ICD-10-CM

## 2023-02-28 DIAGNOSIS — R0602 Shortness of breath: Secondary | ICD-10-CM

## 2023-02-28 DIAGNOSIS — E785 Hyperlipidemia, unspecified: Secondary | ICD-10-CM

## 2023-02-28 DIAGNOSIS — R7303 Prediabetes: Secondary | ICD-10-CM

## 2023-02-28 DIAGNOSIS — G4733 Obstructive sleep apnea (adult) (pediatric): Secondary | ICD-10-CM | POA: Diagnosis not present

## 2023-02-28 DIAGNOSIS — Z1331 Encounter for screening for depression: Secondary | ICD-10-CM | POA: Diagnosis not present

## 2023-02-28 DIAGNOSIS — Z6841 Body Mass Index (BMI) 40.0 and over, adult: Secondary | ICD-10-CM

## 2023-02-28 DIAGNOSIS — M329 Systemic lupus erythematosus, unspecified: Secondary | ICD-10-CM | POA: Diagnosis not present

## 2023-02-28 DIAGNOSIS — E669 Obesity, unspecified: Secondary | ICD-10-CM

## 2023-02-28 DIAGNOSIS — I10 Essential (primary) hypertension: Secondary | ICD-10-CM

## 2023-02-28 DIAGNOSIS — F39 Unspecified mood [affective] disorder: Secondary | ICD-10-CM

## 2023-02-28 NOTE — Assessment & Plan Note (Signed)
Patient is on fluoxetine for mood and anxiety. Patient denies SI/HI.

## 2023-02-28 NOTE — Assessment & Plan Note (Signed)
Hasn't had a flare of lupus since 2009.  She is on hydroxychloroquine and sees Dr. Corliss Skains.  Never been on other medications for management and is well managed.  Not experiencing any side effects of this medication.

## 2023-02-28 NOTE — Assessment & Plan Note (Signed)
Patient unaware she has had prediabetes for over a decade. She has never been on medication for prediabetes before.  Her highest A1c was 6.2.  Most recent A1c was 5.7.

## 2023-02-28 NOTE — Assessment & Plan Note (Signed)
Patient is on losartan, spironolactone.  Her blood pressure was well controlled today.  She mentions she was diagnosed in 1998.  BP has been well controlled since that time. She has been on other medications prior to the current regimen.

## 2023-02-28 NOTE — Assessment & Plan Note (Signed)
Sleep study in 2019 showing OSA.  Weight loss, CPAP, surgical intervention and mouth device discussed per notes- patent opted for weight loss first. Discussed the importance of sleep and treatment of sleep apnea as it relates to weight loss and treatment of the disease of obesity. She is to reach out to Dr. Evlyn Courier office for follow up but will likely need to be seen as a new patient as it has been 5 years since last follow up.

## 2023-02-28 NOTE — Assessment & Plan Note (Signed)
Diagnosed within the last ten years. Cholesterol well controlled on Atorvastin 10mg  daily. She does have a history of TIA a few years ago.  She is experiencing no side effects of the statin.

## 2023-02-28 NOTE — Progress Notes (Signed)
 [facility] MEDICAL WEIGHT [facility] HEALTHY WEIGHT & WELLNESS AT [place] [address] [place] [address] Dept: [phone] Dept Fax: [phone]  At a Glance:  Vitals Temp: 98.5 F (36.9 C) BP: 117/77 Pulse Rate: 61 SpO2: 100 %   Anthropometric Measurements Height: 5\' 4"  (1.626 m) Weight: 236 lb (107 kg) BMI (Calculated): 40.49 Starting Weight: 236 lb Waist Measurement : 47 inches   Body Composition  Body Fat %: 46.8 % Fat Mass (lbs): 110.6 lbs Muscle Mass (lbs): 119.2 lbs Total Body Water (lbs): 79.2 lbs Visceral Fat Rating : 15   Other Clinical Data Today's Visit #: 0 Starting Date: 02/28/23      Indirect Calorimeter completed today shows a VO2 of 144 and a REE of 994.  Her calculated basal metabolic rate is 6578 thus her basal metabolic rate is worse than expected.  Chief Complaint:  Obesity   Subjective:  Veronica [name] (MR# [id number]) is a 62 y.o. female who presents for evaluation and treatment of obesity and related comorbidities. She was referred by Dr. Manson Passey. She had an information session with Shawn in August.   Marilou is currently in the action stage of change and ready to dedicate time achieving and maintaining a healthier weight. Amilya is interested in becoming our patient and working on intensive lifestyle modifications including (but not limited to) diet and exercise for weight loss.  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 Lives at home with her husband Cedric whom she says is supportive of her, eats meals with her and will be eating healthier with her regardless of weight.  She is not anticipating sabotage at work or ay home. Desired weight is 180lbs and last time she was that weight was in late 20s. Eats fast food or take out almost every other day.  Eats out for breakfast for biscuit, lunch at [employer], [employer] or [employer]. When she isn't working she eats at home. She considers herself a picky eater.  Skips lunch frequently.   Food Recall: Breakfast on Sunday- 2 sausage, 2 eggs, grits (1/4 cup)- eats all and feels satisfied.  Eats again 4 hours later and may have ribs, collard greens, cornbread and sweet tea. She will have 3 [place] style ribs, 1/2 cup collard greens, 1 slice corn bread, 1 spoonful potato salad, 2 glasses sweet tea. Feels satisfied and full.  She doesn't eat again for the rest of the day.   Kazzandra has been struggling with her weight. She has been unsuccessful in either losing weight, maintaining weight loss, or reaching her healthy weight goal.  Tynslee's habits were reviewed today and are as follows:  Eats family meals together, Believes family will eat healthier together, and Frequently skips meals   She started gaining weight after age 22.   1. Other fatigue Chantille admits to daytime somnolence and admits to waking up still tired. Patient has a history of symptoms of daytime fatigue, morning fatigue, and morning headache. Dezirae generally gets 3 hours of sleep per night, and states that she has nightime awakenings. Snoring is present. Apneic episodes are not present. Epworth Sleepiness Score is 5.    2. SOBOE (shortness of breath on exertion) Aanvi notes increasing shortness of breath with exercising and seems to be worsening over time with weight gain. She notes getting out of breath sooner with activity than she used to.  This has gotten worse recently. Hector denies shortness of breath at rest or orthopnea.   Depression Screen Adreanne's Food and Mood (modified PHQ-9) score was 6.     12/24/2022    9:56 AM  Depression screen PHQ 2/9  Decreased Interest 2  Down, Depressed, Hopeless 2  PHQ - 2 Score 4  Altered sleeping 3  Tired, decreased energy 2  Change in appetite 2  Feeling bad or failure about yourself  0  Trouble concentrating 2  Moving slowly or fidgety/restless 1   Suicidal thoughts 0  PHQ-9 Score 14    Assessment and Plan:   1. Other fatigue Guenevere does feel that her weight is causing her energy to be lower than it should be. Fatigue may be related to obesity, depression or many other causes. Labs will be ordered, and in the meanwhile, Machell will focus on self care including making healthy food choices, increasing physical activity and focusing on stress reduction.  2. SOBOE (shortness of breath on exertion) Jordynn does feel that she gets out of breath more easily that she used to when she exercises. Caryle's shortness of breath appears to be obesity related and exercise induced. She has agreed to work on weight loss and gradually increase exercise to treat her exercise induced shortness of breath. Will continue to monitor closely.  Problem List Items Addressed This Visit       Cardiovascular and Mediastinum   Essential hypertension, benign (Chronic)    Patient is on losartan, spironolactone.  Her blood pressure was well controlled today.  She mentions she was diagnosed in 1998.  BP has been well controlled since that time. She has been on other medications prior to the current regimen.          Respiratory   OSA (obstructive sleep apnea)    Sleep study in 2019 showing OSA.  Weight loss, CPAP, surgical intervention and mouth device discussed per notes- patent opted for weight loss first. Discussed the importance of sleep and treatment of sleep apnea as it relates to weight loss and treatment of the disease of obesity. She is to reach out to Dr. Evlyn Courier office for follow up but will likely need to be seen as a new patient as it has been 5 years since last follow up.        Other   SLE (systemic lupus erythematosus) (HCC) (Chronic)    Hasn't had a flare of lupus since 2009.  She is on hydroxychloroquine and sees Dr. Corliss Skains.  Never been on other medications for management and is well managed.  Not experiencing any side effects of this medication.        Relevant Orders   Vitamin B12   Folate   Hyperlipidemia    Diagnosed within the last ten years. Cholesterol well controlled on Atorvastin 10mg  daily. She does have a history of TIA a few years ago.  She is experiencing no side effects of the statin.        Relevant Orders   Lipid Panel With LDL/HDL Ratio (Completed)   Prediabetes    Patient unaware she has had prediabetes for over a decade. She has never been on medication for prediabetes before.  Her highest A1c was 6.2.  Most recent A1c was 5.7.      Relevant Orders   Hemoglobin A1c   Insulin, random   Mood disorder (HCC)    Patient is on fluoxetine for mood and anxiety. Patient denies SI/HI.  Other Visit Diagnoses     Other fatigue    -  Primary   Relevant Orders   T4, free   T3   EKG 12-Lead (Completed)   SOBOE (shortness of breath on exertion)       BMI 40.0-44.9, adult (HCC)       Obesity with starting BMI of 40.5            Jnya is currently in the action stage of change and her goal is to continue with weight loss efforts. I recommend Shannen begin the structured treatment plan as follows:  She has agreed to Category 1 Plan  Exercise goals: No exercise has been prescribed at this time.  Behavioral modification strategies:increasing lean protein intake, decreasing eating out, and no skipping meals  She was informed of the importance of frequent follow-up visits to maximize her success with intensive lifestyle modifications for her multiple health conditions. She was informed we would discuss her lab results at her next visit unless there is a critical issue that needs to be addressed sooner. Terryn agreed to keep her next visit at the agreed upon time to discuss these results.  Objective:  General: Cooperative, alert, well developed, in no acute distress. HEENT: Conjunctivae and lids unremarkable. Cardiovascular: Regular rhythm.  Lungs: Normal work of breathing. Neurologic: No focal deficits.    Lab Results  Component Value Date   CREATININE 1.06 (H) 01/11/2023   BUN 20 01/11/2023   NA 138 01/11/2023   K 3.9 01/11/2023   CL 102 01/11/2023   CO2 28 01/11/2023   Lab Results  Component Value Date   ALT 23 01/11/2023   AST 24 01/11/2023   ALKPHOS 61 08/03/2022   BILITOT 0.5 01/11/2023   Lab Results  Component Value Date   HGBA1C 5.7 08/03/2022   HGBA1C 5.8 09/01/2021   HGBA1C 5.7 03/03/2021   HGBA1C 5.8 (H) 08/01/2020   HGBA1C 5.5 11/13/2019   Lab Results  Component Value Date   INSULIN 23.1 08/03/2022   Lab Results  Component Value Date   TSH 1.54 01/11/2023   Lab Results  Component Value Date   CHOL 122 04/09/2022   HDL 40 04/09/2022   LDLCALC 62 04/09/2022   TRIG 111 04/09/2022   CHOLHDL 3.1 04/09/2022   Lab Results  Component Value Date   WBC 5.5 01/11/2023   HGB 12.6 01/11/2023   HCT 39.0 01/11/2023   MCV 86.3 01/11/2023   PLT 344 01/11/2023   Lab Results  Component Value Date   IRON 114 05/18/2008   TIBC 300 05/18/2008   FERRITIN 19 05/18/2008    Attestation Statements:  Reviewed by clinician on day of visit: allergies, medications, problem list, medical history, surgical history, family history, social history, and previous encounter notes.  Time spent on visit including pre-visit chart review and post-visit charting and care was 45 minutes.   Rudean Hitt, MD

## 2023-03-01 LAB — LIPID PANEL WITH LDL/HDL RATIO
Cholesterol, Total: 153 mg/dL (ref 100–199)
HDL: 54 mg/dL (ref >39)
LDL Chol Calc (NIH): 87 mg/dL (ref 0–99)
LDL/HDL Ratio: 1.6 ratio (ref 0.0–3.2)
Triglycerides: 60 mg/dL (ref 0–149)
VLDL Cholesterol Cal: 12 mg/dL (ref 5–40)

## 2023-03-01 LAB — T4, FREE: Free T4: 1.19 ng/dL (ref 0.82–1.77)

## 2023-03-01 LAB — T3: T3, Total: 95 ng/dL (ref 71–180)

## 2023-03-01 LAB — INSULIN, RANDOM: INSULIN: 27.6 u[IU]/mL — ABNORMAL HIGH (ref 2.6–24.9)

## 2023-03-01 LAB — HEMOGLOBIN A1C
Est. average glucose Bld gHb Est-mCnc: 131 mg/dL
Hgb A1c MFr Bld: 6.2 % — ABNORMAL HIGH (ref 4.8–5.6)

## 2023-03-01 LAB — VITAMIN B12: Vitamin B-12: 422 pg/mL (ref 232–1245)

## 2023-03-01 LAB — FOLATE: Folate: 5.6 ng/mL (ref >3.0)

## 2023-03-07 ENCOUNTER — Ambulatory Visit: Payer: 59 | Admitting: Licensed Clinical Social Worker

## 2023-03-07 DIAGNOSIS — F331 Major depressive disorder, recurrent, moderate: Secondary | ICD-10-CM

## 2023-03-07 DIAGNOSIS — F411 Generalized anxiety disorder: Secondary | ICD-10-CM | POA: Diagnosis not present

## 2023-03-07 NOTE — Progress Notes (Signed)
[facility] Counselor/Therapist Progress Note  Patient ID: NASHAY BRICKLEY, MRN: [id number]    Date: 03/07/23  Time Spent: 1016  am - 1100am : 44 Minutes  Treatment Type: Individual Therapy.  Reported Symptoms: Anxiety, Depression  Mental Status Exam: Appearance:  Casual     Behavior: Appropriate  Motor: Normal  Speech/Language:  Clear and Coherent  Affect: Appropriate  Mood: normal  Thought process: normal  Thought content:   WNL  Sensory/Perceptual disturbances:   WNL  Orientation: oriented to person, place, time/date, situation, day of week, month of year, and year  Attention: Good  Concentration: Good  Memory: WNL  Fund of knowledge:  Good  Insight:   Good  Judgment:  Good  Impulse Control: Good   Risk Assessment: Danger to Self:  No Self-injurious Behavior: No Danger to Others: No Duty to Warn:no Physical Aggression / Violence:No  Access to Firearms a concern: No  Gang Involvement:No   Subjective:   [name] participated from office, located at [employer] with Clinician present.  [place] consented to treatment.    Patient presented late for her session. Patient was late arguing with her spouse. She reports that he kept hanging up the phone on her. Patient identified the increase in anxiety and depression over  the past few weeks. Patient shared that she is unhappy due to how her husband treats her. Clinician and patient shared that change must take place in order to improve her situation. Clinician and patient processed ideas to set healthy boundaries with her husband and then the importance of follow through and having a plan in place.   Interventions: Cognitive Behavioral Therapy and Solution-Oriented/Positive Psychology  Diagnosis: Generalized Anxiety Disorder, Major Depressive Disorder, moderate.   Plan: Biviana is to use CBT, mindfulness and coping skills to help manage decrease symptoms associated with their diagnosis.    Long-term goal:   Aunisty will reduce overall level, frequency, and intensity of the feelings of depression, anxiety  evidenced by       decreased irritability, negative self talk, and helpless feelings from 6 to 7 days/week to 0 to 2 days/week per client report for at least 3 consecutive months. Treatment plan to be reviewed by 01/08/2024.  Short-term goal:  Connor will verbally express understanding of the relationship between feelings of depression, anxiety and their impact on thinking patterns and behaviors. Verbalize an understanding of the role that distorted thinking plays in creating fears, excessive worry, and ruminations.  Phyllis Ginger MSW, LCSW DATE: 03/07/2023

## 2023-03-13 ENCOUNTER — Other Ambulatory Visit (HOSPITAL_COMMUNITY): Payer: Self-pay

## 2023-03-14 ENCOUNTER — Encounter (INDEPENDENT_AMBULATORY_CARE_PROVIDER_SITE_OTHER): Payer: Self-pay | Admitting: Family Medicine

## 2023-03-14 ENCOUNTER — Ambulatory Visit (INDEPENDENT_AMBULATORY_CARE_PROVIDER_SITE_OTHER): Payer: 59 | Admitting: Family Medicine

## 2023-03-14 VITALS — BP 123/76 | HR 64 | Temp 98.5°F | Ht 64.0 in | Wt 235.0 lb

## 2023-03-14 DIAGNOSIS — R7303 Prediabetes: Secondary | ICD-10-CM

## 2023-03-14 DIAGNOSIS — E785 Hyperlipidemia, unspecified: Secondary | ICD-10-CM | POA: Diagnosis not present

## 2023-03-14 DIAGNOSIS — E669 Obesity, unspecified: Secondary | ICD-10-CM

## 2023-03-14 DIAGNOSIS — Z6841 Body Mass Index (BMI) 40.0 and over, adult: Secondary | ICD-10-CM

## 2023-03-14 DIAGNOSIS — E559 Vitamin D deficiency, unspecified: Secondary | ICD-10-CM

## 2023-03-14 MED ORDER — VITAMIN D (ERGOCALCIFEROL) 1.25 MG (50000 UNIT) PO CAPS: 50000.0000 [IU] | ORAL_CAPSULE | ORAL | 0 refills | Status: DC

## 2023-03-14 NOTE — Assessment & Plan Note (Signed)
 Discussed importance of vitamin d supplementation.  Vitamin d supplementation has been shown to decrease fatigue, decrease risk of progression to insulin resistance and then prediabetes, decreases risk of falling in older age and can even assist in decreasing depressive symptoms in PTSD.   Prescription for Vitamin D sent in.

## 2023-03-14 NOTE — Assessment & Plan Note (Signed)
The 10-year ASCVD risk score (Arnett DK, et al., 2019) is: 4.8%   Values used to calculate the score:     Age: 62 years     Sex: Female     Is Non-Hispanic African American: Yes     Diabetic: No     Tobacco smoker: No     Systolic Blood Pressure: 123 mmHg     Is BP treated: Yes     HDL Cholesterol: 54 mg/dL     Total Cholesterol: 153 mg/dL   Patient already on Lipitor.  Labs at goal.  Will repeat in 6 months.

## 2023-03-14 NOTE — Assessment & Plan Note (Signed)

## 2023-03-14 NOTE — Progress Notes (Signed)
SUBJECTIVE:  Chief Complaint: Obesity  Interim History: Patient felt the first few days went ok but then she got progressively more hungry after those days.  She was hungry after breakfast and then hungry again after dinner- she felt hungry for snacks after dinner.  She did salads for dinner with shrimp or chicken.  She did not weigh out the protein for dinner or lunch.  She is going to family's house for Thanksgiving but will only be there for the day.  She is wondering about whether or not she needs to eat [place] yogurt.    Veronica [name] is here to discuss her progress with her obesity treatment plan. She is on the Category 1 Plan and states she is following her eating plan approximately 50 % of the time. She states she is not exercising.   OBJECTIVE: Visit Diagnoses: Problem List Items Addressed This Visit       Other   Hyperlipidemia    The 10-year ASCVD risk score (Arnett DK, et al., 2019) is: 4.8%   Values used to calculate the score:     Age: 62 years     Sex: Female     Is Non-Hispanic African American: Yes     Diabetic: No     Tobacco smoker: No     Systolic Blood Pressure: 123 mmHg     Is BP treated: Yes     HDL Cholesterol: 54 mg/dL     Total Cholesterol: 153 mg/dL   Patient already on Lipitor.  Labs at goal.  Will repeat in 6 months.      Prediabetes    Pathophysiology of progression through insulin resistance to prediabetes and diabetes was discussed at length today.  Patient to continue to monitor and be in control of total intake of snack calories which may be simple carbohydrates but should be consumed only after the patient has taken in all the nutrition for the day.  Macronutrient identification, classification and daily intake ratios were discussed.  Plan to repeat labs in 3 months to monitor both hemoglobin A1c and insulin levels.  No medications at this time as patient is not having significant hunger or cravings that would make following meal plan more difficult.          Vitamin D deficiency - Primary    Discussed importance of vitamin d supplementation.  Vitamin d supplementation has been shown to decrease fatigue, decrease risk of progression to insulin resistance and then prediabetes, decreases risk of falling in older age and can even assist in decreasing depressive symptoms in PTSD.   Prescription for Vitamin D sent in.        Relevant Medications   Vitamin D, Ergocalciferol, (DRISDOL) 1.25 MG (50000 UNIT) CAPS capsule   Other Visit Diagnoses     Obesity with starting BMI of 40.5       BMI 40.0-44.9, adult (HCC)           Vitals Temp: 98.5 F (36.9 C) BP: 123/76 Pulse Rate: 64 SpO2: 100 %   Anthropometric Measurements Height: 5\' 4"  (1.626 m) Weight: 235 lb (106.6 kg) BMI (Calculated): 40.32 Weight at Last Visit: 236 lb Weight Lost Since Last Visit: 1 Weight Gained Since Last Visit: 0 Starting Weight: 236 lb Total Weight Loss (lbs): 1 lb (0.454 kg)   Body Composition  Body Fat %: 47.1 % Fat Mass (lbs): 111 lbs Muscle Mass (lbs): 118.4 lbs Total Body Water (lbs): 79.8 lbs Visceral Fat Rating : 15   Other Clinical  Data Today's Visit #: 1 Starting Date: 02/28/23     ASSESSMENT AND PLAN:  Diet: Veronica [name] is currently in the action stage of change. As such, her goal is to continue with weight loss efforts. She has agreed to Category 1 Plan with an additional cup of vegetables.  She wants to get more consistent with weighing and measuring.  Exercise: Veronica [name] has been instructed that some exercise is better than none for weight loss and overall health benefits.   Behavior Modification:  We discussed the following Behavioral Modification Strategies today: increasing lean protein intake, weighing out protein intake, decreasing simple carbohydrates and no skipping meals.    No follow-ups on file.Marland Kitchen She was informed of the importance of frequent follow up visits to maximize her success with intensive lifestyle modifications for  her multiple health conditions.  Attestation Statements:   Reviewed by clinician on day of visit: allergies, medications, problem list, medical history, surgical history, family history, social history, and previous encounter notes.    Reuben Likes, MD

## 2023-03-16 LAB — AMB RESULTS CONSOLE CBG: Glucose: 115

## 2023-03-16 NOTE — Progress Notes (Signed)
Declined SDOH.

## 2023-03-25 ENCOUNTER — Ambulatory Visit (INDEPENDENT_AMBULATORY_CARE_PROVIDER_SITE_OTHER): Payer: PRIVATE HEALTH INSURANCE | Admitting: Licensed Clinical Social Worker

## 2023-03-25 DIAGNOSIS — F411 Generalized anxiety disorder: Secondary | ICD-10-CM | POA: Diagnosis not present

## 2023-03-25 NOTE — Progress Notes (Signed)
[facility] Counselor/Therapist Progress Note  Patient ID: ARRIONA ANDERSSON, MRN: [id number]    Date: 03/25/23  Time Spent: 1000  am - 1047 am : 47 Minutes  Treatment Type: Individual Therapy.  Reported Symptoms: Anxiety related to marital stress  Mental Status Exam: Appearance:  Casual     Behavior: Appropriate  Motor: Normal  Speech/Language:  Clear and Coherent  Affect: Appropriate  Mood: normal  Thought process: normal  Thought content:   WNL  Sensory/Perceptual disturbances:   WNL  Orientation: oriented to person, place, time/date, situation, day of week, month of year, and year  Attention: Good  Concentration: Good  Memory: WNL  Fund of knowledge:  Good  Insight:   Good  Judgment:  Good  Impulse Control: Good   Risk Assessment: Danger to Self:  No Self-injurious Behavior: No Danger to Others: No Duty to Warn:no Physical Aggression / Violence:No  Access to Firearms a concern: No  Gang Involvement:No   Subjective:   [name] participated from office, located at [employer] with Clinician present. Normalee consented to treatment.    "Client presented today reporting significant anxiety primarily related to ongoing marital conflict with their spouse regarding finances. Client described feeling overwhelmed, constantly on edge, and having difficulty when working due to worry about her spouse calling and making accusations about being with another man.  Client identified specific triggers including her spouse wasting money and perceived criticism from her spouse during disagreements.  Intervention: Session focused on cognitive restructuring techniques to challenge negative thought patterns about the conflict, along with relaxation strategies like deep breathing exercises to manage immediate anxiety. Client practiced assertive communication skills to express their needs more effectively in future discussions with their partner.  Assessment: Client  demonstrated a good understanding of the connection between marital conflict and their anxiety, actively participated in exercises, and appeared more empowered to address the issues at hand.  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  Interventions: Cognitive Behavioral Therapy  Diagnosis: Generalized Anxiety Disorder    Phyllis Ginger MSW, LCSW DATE: 03/25/2023

## 2023-04-08 ENCOUNTER — Other Ambulatory Visit (INDEPENDENT_AMBULATORY_CARE_PROVIDER_SITE_OTHER): Payer: Self-pay | Admitting: Family Medicine

## 2023-04-08 ENCOUNTER — Other Ambulatory Visit: Payer: Self-pay | Admitting: Rheumatology

## 2023-04-08 ENCOUNTER — Ambulatory Visit (INDEPENDENT_AMBULATORY_CARE_PROVIDER_SITE_OTHER): Payer: 59 | Admitting: Gastroenterology

## 2023-04-08 ENCOUNTER — Encounter: Payer: Self-pay | Admitting: Gastroenterology

## 2023-04-08 VITALS — BP 116/74 | HR 72 | Ht 64.0 in | Wt 239.1 lb

## 2023-04-08 DIAGNOSIS — R1110 Vomiting, unspecified: Secondary | ICD-10-CM | POA: Diagnosis not present

## 2023-04-08 DIAGNOSIS — M329 Systemic lupus erythematosus, unspecified: Secondary | ICD-10-CM

## 2023-04-08 DIAGNOSIS — Z8719 Personal history of other diseases of the digestive system: Secondary | ICD-10-CM

## 2023-04-08 DIAGNOSIS — R635 Abnormal weight gain: Secondary | ICD-10-CM

## 2023-04-08 DIAGNOSIS — K219 Gastro-esophageal reflux disease without esophagitis: Secondary | ICD-10-CM | POA: Diagnosis not present

## 2023-04-08 DIAGNOSIS — R059 Cough, unspecified: Secondary | ICD-10-CM | POA: Diagnosis not present

## 2023-04-08 DIAGNOSIS — E559 Vitamin D deficiency, unspecified: Secondary | ICD-10-CM

## 2023-04-08 NOTE — Telephone Encounter (Signed)
Last Fill: 01/11/2023  Eye exam: not on file   Labs: 01/11/2023 Creatinine is mildly elevated.     Next Visit: 05/23/2023  Last Visit: 02/08/2023  DX:Other systemic lupus erythematosus with other organ involvement   Current Dose per office note 02/08/2023: Plaquenil 200 mg p.o. twice daily   Attempted to contact the patient and left message to advise patient she is due for her PLQ eye exam.   Okay to refill Plaquenil?

## 2023-04-08 NOTE — Patient Instructions (Signed)
VISIT SUMMARY:  Veronica [name], during your visit today, we discussed your persistent coughing, which you believe might be due to a cold. We also reviewed your history of acid reflux and your current weight management efforts. You are currently taking pantoprazole for acid reflux and have recently started vitamin D and prenatal vitamins. You mentioned a single episode of vomiting, which we believe is related to your coughing. We also discussed your weight gain and your goal to reduce your weight to 190 lbs.  YOUR PLAN:  -GASTROESOPHAGEAL REFLUX DISEASE (GERD): GERD is a condition where stomach acid frequently flows back into the tube connecting your mouth and stomach, causing irritation. You are currently not experiencing heartburn or regurgitation, but your coughing might be related to GERD or a cold. Continue taking Pantoprazole daily and consider an endoscopy if your symptoms change or worsen.  -WEIGHT MANAGEMENT: Managing your weight can help improve your GERD symptoms. You have gained weight over the years and are currently trying to lose weight. Continue your efforts to lose weight, and try to cut out sugar, especially sweet tea, from your diet.  -DIVERTICULITIS: Diverticulitis is an inflammation or infection of small pouches that can form in your intestines. You have not had any recent episodes, so continue with your current management.  -VOMITING: You experienced a single episode of vomiting, which is likely related to your coughing. There was no associated pain or changes in bowel habits. Monitor for any further episodes.  -GENERAL HEALTH MAINTENANCE: Continue taking Vitamin D and prenatal vitamins as prescribed by your weight management doctor. Return for a follow-up in 1 year unless your symptoms change or worsen.  INSTRUCTIONS:  Please continue taking Pantoprazole daily for your GERD. Monitor your symptoms and consider an endoscopy if they change or worsen. Keep up with your weight loss  efforts and try to eliminate sugar from your diet. Watch for any further episodes of vomiting. Continue with your current management for diverticulitis and take your vitamins as prescribed. Return for a follow-up in 1 year unless your symptoms change or worsen.

## 2023-04-08 NOTE — Progress Notes (Signed)
Veronica [name]    [id number]    29-Oct-1960  Primary Care Physician:Brown, Bonner Puna, MD  Referring Physician: Westley Chandler, MD [address] [place],  [place] [address]   Chief complaint:  Cough  Discussed the use of AI scribe software for clinical note transcription with the patient, who gave verbal consent to proceed.  History of Present Illness   Miss Veronica [name], a 62 year old patient with a history of acid reflux, presents with a chief complaint of persistent coughing. The patient denies any difficulty swallowing, heartburn, regurgitation, or choking on food. The coughing is reported to be regular, but the patient speculates it may be due to a cold, as she has been experiencing increased phlegm production. The patient denies any abdominal pain and reports no recent episodes of diverticulitis.  Bowel habits are reported as regular with daily bowel movements and no evidence of bleeding or black stool. The patient mentions occasional use of Pepto-Bismol when feeling 'naughty,' but this is not a frequent occurrence. The patient experienced a single episode of vomiting the previous day, which was sudden and consisted of liquid. There was no associated abdominal pain or nausea, and the patient felt fine afterward.  The patient is currently on a regimen of pantoprazole for acid reflux and has recently started taking vitamin D and prenatal vitamins on the advice of a weight management doctor. The patient has a history of weight gain over the years, with a current weight of 239 lbs, up from a previous weight of 225 lbs. The patient's goal is to reduce her weight to 190 lbs.  The patient's mother, who is in her 41s, also has acid reflux and has required throat stretching procedures. The patient is considering an endoscopy to assess the lining of her throat and determine if a change in medication is necessary. The patient is also considering dietary changes, such as smaller portion  sizes and reducing sugar intake, to manage her weight and potentially alleviate her acid reflux symptoms.      Barium esophagogram 12/21/2022 1. Spontaneous gastroesophageal reflux. 2. Mild lower esophageal dysmotility.  She has history of recurrent diverticulitis, had undergone colonoscopy in 2017 per Dr. Elnoria Howard with finding of sigmoid diverticulosis and one 3 mm polyp was removed from the rectosigmoid which by path was hyperplastic.  She had an episode of diverticulitis in the spring 2024, was unable to be seen by Dr.Hung at that time due to her insurance, and eventually symptoms resolved without an antibiotic.    Outpatient Encounter Medications as of 04/08/2023  Medication Sig   amoxicillin-clavulanate (AUGMENTIN) 875-125 MG tablet Take 1 tablet by mouth 2 (two) times daily.   aspirin EC 81 MG tablet Take 1 tablet (81 mg total) by mouth daily. Swallow whole.   atorvastatin (LIPITOR) 10 MG tablet Take 1 tablet (10 mg total) by mouth daily.   cetirizine (ZYRTEC) 10 MG tablet Take 1 tablet (10 mg total) by mouth daily.   FLUoxetine (PROZAC) 20 MG capsule Take 1 capsule (20 mg total) by mouth daily.   hydroxychloroquine (PLAQUENIL) 200 MG tablet Take 1 tablet (200 mg total) by mouth 2 (two) times daily.   indapamide (LOZOL) 2.5 MG tablet Take 1 tablet (2.5 mg total) by mouth daily.   losartan (COZAAR) 100 MG tablet Take 1 tablet (100 mg total) by mouth daily.   pantoprazole (PROTONIX) 40 MG tablet Take 1 tablet (40 mg total) by mouth daily.   polyethylene glycol  powder (GLYCOLAX/MIRALAX) 17 GM/SCOOP powder Take 17 g by mouth 2 (two) times daily as needed.   potassium chloride (KLOR-CON) 20 MEQ packet Take by mouth 2 (two) times daily.   Prenatal Vit-Fe Fumarate-FA (PRENATAL VITAMINS PO) Take by mouth.   spironolactone (ALDACTONE) 25 MG tablet Take 1 tablet (25 mg total) by mouth daily.   topiramate (TOPAMAX) 50 MG tablet Take 1 tablet (50 mg total) by mouth at bedtime.   Vitamin D,  Ergocalciferol, (DRISDOL) 1.25 MG (50000 UNIT) CAPS capsule Take 1 capsule (50,000 Units total) by mouth every 7 (seven) days.   [DISCONTINUED] meloxicam (MOBIC) 15 MG tablet Take 1 tablet (15 mg total) by mouth daily.   No facility-administered encounter medications on file as of 04/08/2023.    Allergies as of 04/08/2023 - Review Complete 04/08/2023  Allergen Reaction Noted   Amlodipine Swelling 01/09/2017   Tessalon [benzonatate] Other (See Comments) 07/19/2014    Past Medical History:  Diagnosis Date   Anxiety    Bilateral swelling of feet    Chest pain    Chewing difficulty    Constipation    Daily headache    Depression    Diverticulitis    Fibromyalgia    G6PD deficiency    outside labs--low level, no longer on HCQ   Glucose 6 phosphatase deficiency (HCC)    High cholesterol    History of cholelithiasis    History of colon polyps    Hypertension    Joint pain    Mini stroke    Osteoarthritis    Prediabetes    Rheumatoid arthritis (HCC)    SLE (systemic lupus erythematosus) (HCC) 1998   SOB (shortness of breath)    Swallowing difficulty     Past Surgical History:  Procedure Laterality Date   ABDOMINAL HYSTERECTOMY  09/2008   CESAREAN SECTION  1992, 1995   CHOLECYSTECTOMY N/A 07/18/2012   Procedure: LAPAROSCOPIC CHOLECYSTECTOMY WITH INTRAOPERATIVE CHOLANGIOGRAM;  Surgeon: Atilano Ina, MD;  Location: [facility] OR;  Service: General;  Laterality: N/A;   COLONOSCOPY     ERCP N/A 07/29/2012   Procedure: ENDOSCOPIC RETROGRADE CHOLANGIOPANCREATOGRAPHY (ERCP);  Surgeon: Theda Belfast, MD;  Location: [facility];  Service: Endoscopy;  Laterality: N/A;  Dr. Elnoria Howard said he would start this PT arond 1330( AW)   INCISION AND DRAINAGE ABSCESS Right 12/02/2015   Procedure: INCISION AND DRAINAGE ABSCESS;  Surgeon: Jimmye Norman, MD;  Location: [facility] OR;  Service: General;  Laterality: Right;  Right axillary abscess   TUBAL LIGATION  1995    Family History  Problem Relation Age of  Onset   Hypertension Mother    Diabetes Mother    Bladder Cancer Mother    Sleep apnea Mother    Hypertension Father    Kidney disease Father    Diabetes Sister    Multiple sclerosis Sister    Diabetes Brother    Hypertension Brother    Healthy Son    Healthy Son     Social History   Socioeconomic History   Marital status: Married    Spouse name: Cedric   Number of children: 2   Years of education: Not on file   Highest education level: Some college, no degree  Occupational History    Employer: [employer]   Occupation: Live In Chief Operating Officer  Tobacco Use   Smoking status: Never    Passive exposure: Current   Smokeless tobacco: Never  Vaping Use   Vaping status: Never Used  Substance and Sexual Activity  Alcohol use: No   Drug use: No   Sexual activity: Yes    Partners: Male    Birth control/protection: Surgical  Other Topics Concern   Not on file  Social History Narrative   Pt lives with spouse. Some college.    Works 7 days on then 7 days off at place of work       Patient is right-handed. She lives with her husband in a one level home. She occasionally drinks soda. She does not exercise.    Social Determinants of Health   Financial Resource Strain: Not on file  Food Insecurity: Patient Declined (03/16/2023)   Hunger Vital Sign    Worried About Running Out of Food in the Last Year: Patient declined    Ran Out of Food in the Last Year: Patient declined  Transportation Needs: Patient Declined (03/16/2023)   PRAPARE - Administrator, Civil Service (Medical): Patient declined    Lack of Transportation (Non-Medical): Patient declined  Physical Activity: Not on file  Stress: No Stress Concern Present (02/13/2023)   [employer] of Occupational Health - Occupational Stress Questionnaire    Feeling of Stress : Not at all  Recent Concern: Stress - Stress Concern Present (12/19/2022)   [employer] of Occupational Health - Occupational Stress  Questionnaire    Feeling of Stress : To some extent  Social Connections: Not on file  Intimate Partner Violence: Patient Declined (03/16/2023)   Humiliation, Afraid, Rape, and Kick questionnaire    Fear of Current or Ex-Partner: Patient declined    Emotionally Abused: Patient declined    Physically Abused: Patient declined    Sexually Abused: Patient declined      Review of systems: All other review of systems negative except as mentioned in the HPI.   Physical Exam: Vitals:   04/08/23 0941  BP: 116/74  Pulse: 72   Body mass index is 41.05 kg/m. Gen:      No acute distress HEENT:  sclera anicteric CV: s1s2 rrr, no murmur Lungs: B/l clear. Abd:      soft, non-tender; no palpable masses, no distension Ext:    No edema Neuro: alert and oriented x 3 Psych: normal mood and affect  Data Reviewed:  Reviewed labs, radiology imaging, old records and pertinent past GI work up     Assessment and Plan    Gastroesophageal Reflux Disease (GERD) No current symptoms of heartburn or regurgitation. Coughing may be related to GERD or a concurrent cold. Patient is currently on Pantoprazole. -Continue Pantoprazole 40mg  daily and antireflux measures. -Consider endoscopy if symptoms change or worsen.  Weight Management Patient has gained weight over the years and is currently trying to lose weight. Weight loss may help improve GERD symptoms. -Encourage continued efforts for weight loss. -Advise to cut out sugar, particularly sweet tea, from diet.  Diverticulitis No recent episodes. -Continue current management.  Vomiting Single episode of vomiting, likely related to coughing. No associated pain or changes in bowel habits. -Monitor for any further episodes.  General Health Maintenance -Continue Vitamin D and prenatal vitamins as prescribed by weight management doctor. -Return in 1 year unless symptoms change or worsen.       This visit required >30 minutes of patient care  (this includes precharting, chart review, review of results, face-to-face time used for counseling as well as treatment plan and follow-up. The patient was provided an opportunity to ask questions and all were answered. The patient agreed with the plan and demonstrated an  understanding of the instructions.  Iona Beard , MD    CC: Westley Chandler, MD

## 2023-04-09 ENCOUNTER — Ambulatory Visit (INDEPENDENT_AMBULATORY_CARE_PROVIDER_SITE_OTHER): Payer: 59 | Admitting: Family Medicine

## 2023-04-09 ENCOUNTER — Encounter (INDEPENDENT_AMBULATORY_CARE_PROVIDER_SITE_OTHER): Payer: Self-pay | Admitting: Family Medicine

## 2023-04-09 VITALS — BP 138/84 | HR 64 | Temp 98.1°F | Ht 64.0 in | Wt 235.0 lb

## 2023-04-09 DIAGNOSIS — E559 Vitamin D deficiency, unspecified: Secondary | ICD-10-CM | POA: Diagnosis not present

## 2023-04-09 DIAGNOSIS — Z6841 Body Mass Index (BMI) 40.0 and over, adult: Secondary | ICD-10-CM | POA: Diagnosis not present

## 2023-04-09 DIAGNOSIS — E669 Obesity, unspecified: Secondary | ICD-10-CM | POA: Diagnosis not present

## 2023-04-09 DIAGNOSIS — I10 Essential (primary) hypertension: Secondary | ICD-10-CM

## 2023-04-09 MED ORDER — VITAMIN D (ERGOCALCIFEROL) 1.25 MG (50000 UNIT) PO CAPS: 50000.0000 [IU] | ORAL_CAPSULE | ORAL | 0 refills | Status: DC

## 2023-04-09 NOTE — Progress Notes (Incomplete)
Veronica [name], D.O.  ABFM, ABOM Specializing in Clinical Bariatric Medicine  Office located at: [address] [place], [place]  [address]   Assessment and Plan:   FOR THE DISEASE OF OBESITY: BMI 40.0-44.9, adult (HCC) - current BMI 40.32 Obesity with starting BMI of 40.5 Assessment & Plan: Since last office visit on 03/14/23 patient's muscle mass has decreased by 0.2 lb. Fat mass has increased by 0.2 lb. Total body water has decreased by 0.4 lb.  Counseling done on how various foods will affect these numbers and how to maximize success  Total lbs lost to date: 1 lb  Total weight loss percentage to date: 0.42%    Recommended Dietary Goals Veronica [name] is currently in the action stage of change. As such, her goal is to continue weight management plan.  She has agreed the: Category 1 meal plan with Lunch Options    Behavioral Intervention We discussed the following today: continue to work on maintaining a reduced calorie state, getting the recommended amount of protein, incorporating whole foods, making healthy choices, staying well hydrated and practicing mindfulness when eating.  Additional resources provided today: handout on CAT 1 meal plan  and Handout on CAT 1-2 lunch options  Evidence-based interventions for health behavior change were utilized today including the discussion of self monitoring techniques, problem-solving barriers and SMART goal setting techniques.   Regarding patient's less desirable eating habits and patterns, we employed the technique of small changes.   Pt will specifically work on: eating everything on plan for dinner.    Recommended Physical Activity Goals Veronica [name] has been advised to work up to 150 minutes of moderate intensity aerobic activity a week and strengthening exercises 2-3 times per week for cardiovascular health, weight loss maintenance and preservation of muscle mass.   She has agreed to : Continue current level of physical activity     Pharmacotherapy We both agreed to : continue with nutritional and behavioral strategies   FOR ASSOCIATED CONDITIONS ADDRESSED TODAY:  Essential hypertension, benign Assessment & Plan: Last 3 blood pressure readings in our office are as follows: BP Readings from Last 3 Encounters:  04/09/23 138/84  04/08/23 116/74  03/14/23 123/76   HTN treated with Losartan 100 mg daily and Spironolactone 25 mg daily. Blood pressure high normal today - pt endorses forgetting to take her BP medications today. Pt completely asymptomatic. Pt reminded to take her blood pressure medications consistently. Continue with low-sodium diet. Advance exercise as tolerated.    Vitamin D deficiency Assessment & Plan: Most recent vitamin D:  Lab Results  Component Value Date   VD25OH 24 01/11/2023   She is currently on ERGO 50,000 units once a week without any adverse SE. Continue with weight loss efforts and high dose vitamin D. Will recheck in 1 month or so.   Orders: -     Vitamin D (Ergocalciferol); Take 1 capsule (50,000 Units total) by mouth every 7 (seven) days.  Dispense: 4 capsule; Refill: 0   Follow up:   Return 05/02/23. She was informed of the importance of frequent follow up visits to maximize her success with intensive lifestyle modifications for her multiple health conditions.  Subjective:   Chief complaint: Obesity Veronica [name] is here to discuss her progress with her obesity treatment plan. She is on the the Category 1 Plan and states she is following her eating plan approximately 0% of the time. She states she is not exercising.   Interval History:  Veronica [name] is here  for a follow up office visit. First encounter with pt; she has seen either Dr.Ukleja or Shawn so far. Since last OV,  pt's weight has not changed. Food wise, she has 2 eggs and 1 piece of toast for breakfast. For lunch, she has sandwiches with lunch meat. Sometimes has sweet cravings when around enticing environments.    Pharmacotherapy for weight loss: She is currently taking  Topamax 50 mg daily .   Review of Systems:  Pertinent positives were addressed with patient today.  Reviewed by clinician on day of visit: allergies, medications, problem list, medical history, surgical history, family history, social history, and previous encounter notes.  Weight Summary and Biometrics   Weight Lost Since Last Visit: 0lb  Weight Gained Since Last Visit: 0lb   Vitals Temp: 98.1 F (36.7 C) BP: 138/84 Pulse Rate: 64 SpO2: 99 %   Anthropometric Measurements Height: 5\' 4"  (1.626 m) Weight: 235 lb (106.6 kg) BMI (Calculated): 40.32 Weight at Last Visit: 235lb Weight Lost Since Last Visit: 0lb Weight Gained Since Last Visit: 0lb Starting Weight: 236lb Total Weight Loss (lbs): 1 lb (0.454 kg) Peak Weight: 235lb   Body Composition  Body Fat %: 47.2 % Fat Mass (lbs): 111.2 lbs Muscle Mass (lbs): 118.2 lbs Total Body Water (lbs): 79.4 lbs Visceral Fat Rating : 15   Other Clinical Data Fasting: yes Labs: no Today's Visit #: 3 Starting Date: 02/28/23   Objective:   PHYSICAL EXAM: Blood pressure 138/84, pulse 64, temperature 98.1 F (36.7 C), height 5\' 4"  (1.626 m), weight 235 lb (106.6 kg), SpO2 99%. Body mass index is 40.34 kg/m.  General: she is overweight, cooperative and in no acute distress. PSYCH: Has normal mood, affect and thought process.   HEENT: EOMI, sclerae are anicteric. Lungs: Normal breathing effort, no conversational dyspnea. Extremities: Moves * 4 Neurologic: A and O * 3, good insight  DIAGNOSTIC DATA REVIEWED: BMET    Component Value Date/Time   NA 138 01/11/2023 1057   NA 139 10/12/2022 1140   K 3.9 01/11/2023 1057   CL 102 01/11/2023 1057   CO2 28 01/11/2023 1057   GLUCOSE 85 01/11/2023 1057   BUN 20 01/11/2023 1057   BUN 15 10/12/2022 1140   CREATININE 1.06 (H) 01/11/2023 1057   CALCIUM 9.8 01/11/2023 1057   GFRNONAA 64 04/15/2020 1121   GFRNONAA 87  06/13/2016 1436   GFRAA 74 04/15/2020 1121   GFRAA >89 06/13/2016 1436   Lab Results  Component Value Date   HGBA1C 6.2 (H) 02/28/2023   HGBA1C 6.2 (H) 02/11/2009   Lab Results  Component Value Date   INSULIN 27.6 (H) 02/28/2023   INSULIN 23.1 08/03/2022   Lab Results  Component Value Date   TSH 1.54 01/11/2023   CBC    Component Value Date/Time   WBC 5.5 01/11/2023 1057   RBC 4.52 01/11/2023 1057   HGB 12.6 01/11/2023 1057   HGB 12.5 08/03/2022 1210   HCT 39.0 01/11/2023 1057   HCT 40.0 08/03/2022 1210   PLT 344 01/11/2023 1057   PLT 350 08/03/2022 1210   MCV 86.3 01/11/2023 1057   MCV 84 08/03/2022 1210   MCH 27.9 01/11/2023 1057   MCHC 32.3 01/11/2023 1057   RDW 12.1 01/11/2023 1057   RDW 12.4 08/03/2022 1210   Iron Studies    Component Value Date/Time   IRON 114 05/18/2008 0405   TIBC 300 05/18/2008 0405   FERRITIN 19 05/18/2008 0405   IRONPCTSAT 38 05/18/2008 0405  Lipid Panel     Component Value Date/Time   CHOL 153 02/28/2023 1110   TRIG 60 02/28/2023 1110   HDL 54 02/28/2023 1110   CHOLHDL 3.1 04/09/2022 1106   CHOLHDL 4.4 06/13/2016 1436   VLDL 30 06/13/2016 1436   LDLCALC 87 02/28/2023 1110   Hepatic Function Panel     Component Value Date/Time   PROT 8.0 01/11/2023 1057   PROT 7.6 08/03/2022 1210   ALBUMIN 4.1 08/03/2022 1210   AST 24 01/11/2023 1057   ALT 23 01/11/2023 1057   ALKPHOS 61 08/03/2022 1210   BILITOT 0.5 01/11/2023 1057   BILITOT 0.3 08/03/2022 1210   BILIDIR 3.7 (H) 07/28/2012 1200   IBILI 1.7 (H) 07/28/2012 1200      Component Value Date/Time   TSH 1.54 01/11/2023 1057   Nutritional Lab Results  Component Value Date   VD25OH 34 01/11/2023    Attestations:   I, Special Puri, acting as a Stage manager for [employer], DO., have compiled all relevant documentation for today's office visit on behalf of Thomasene Lot, DO, while in the presence of [employer], DO.  I have reviewed the above  documentation for accuracy and completeness, and I agree with the above. Veronica [name], D.O.  The 21st Century Cures Act was signed into law in 2016 which includes the topic of electronic health records.  This provides immediate access to information in MyChart.  This includes consultation notes, operative notes, office notes, lab results and pathology reports.  If you have any questions about what you read please let us know at your next visit so we can discuss your concerns and take corrective action if need be.  We are right here with you.

## 2023-04-11 NOTE — Progress Notes (Signed)
    SUBJECTIVE:   CHIEF COMPLAINT: check up HPI:   ANALENA TAPANI is a 62 y.o.  with history notable for HTN presenting for follow up.   Carpal Tunnel She is using night splints regularly. Still has some pain, numbness, and intermittent weakness in R hand. No neck symptoms.  SLE  Recently saw Rheumatology who diagnosed her with OA. She has bilateral knee pain. Anteriorly located, worse with stairs. Not taking anything for this. Previously saw Ortho who recommended arthroscopy.  Fall Fell 2-3 weeks ago. Rolled her R ankle which is painful, was more edematous. No head trauma, LOC, or dizziness. Hit side of car when it happened.   Mood is okay--she is off for Dec 24-25!   She is most disappointed in her weight. She sees healthy weight and wellness. Has gained weight. Intereseted in 'appetite suppressant'. Has been weighing all foods, increasing protein.   Reports a long history of intermittent coughing. Had a recent virus that caused wheezing but none sense. She is a nonsmoker. Regularly takes a PPI and follows with GI PERTINENT  PMH / PSH/Family/Social History : SLE, OA, Obesity  OBJECTIVE:   BP 131/71   Pulse 70   Ht 5\' 4"  (1.626 m)   Wt 242 lb 3.2 oz (109.9 kg)   SpO2 98%   BMI 41.57 kg/m   Today's weight:  Last Weight  Most recent update: 04/12/2023 10:58 AM    Weight  109.9 kg (242 lb 3.2 oz)            Review of prior weights: Filed Weights   04/12/23 1057  Weight: 242 lb 3.2 oz (109.9 kg)   HEENT: EOMI. Sclera without injection or icterus. MMM. External auditory canal examined and WNL. TM normal appearance, no erythema or bulging. Neck: Supple.  Cardiac: Regular rate and rhythm. Normal S1/S2. No murmurs, rubs, or gallops appreciated. Lungs: Clear bilaterally to ascultation.  Abdomen: Normoactive bowel sounds. No tenderness to deep or light palpation. No rebound or guarding.    Bilateral knees with + crepitus No TTP along medial joint line No pes bursa  tenderness  Ankle on R with mild edema over joint TTP over lateral posterior malleolus   ASSESSMENT/PLAN:   Assessment & Plan Essential hypertension, benign At goal Had creatinine elevated in September repeat today Acute right ankle pain Suspect sprain Meets [place] ankle rules for imaging  Chronic cough DDX includes GERD, no medications, less likely post nasal drainage  CXR given duration Does have passive smoke exposure If persists consider CT  Primary osteoarthritis of both knees DDX includes patellofemoral pain syndrome  Discussed Voltaren, PT, GC injections Patient decided on PT  Encounter for immunization Flu and covid given  Encounter for screening for malignant neoplasm of breast, unspecified screening modality Mammogram ordered Prediabetes Will route to [place] to see if Reginal Lutes can be run through secondary insurance   Carpal Tunnel Previously referred to hand surgery Number given to call   [name], MD  Family Medicine Teaching Service  [facility]

## 2023-04-12 ENCOUNTER — Encounter: Payer: Self-pay | Admitting: Family Medicine

## 2023-04-12 ENCOUNTER — Ambulatory Visit: Payer: 59 | Admitting: Family Medicine

## 2023-04-12 ENCOUNTER — Ambulatory Visit
Admission: RE | Admit: 2023-04-12 | Discharge: 2023-04-12 | Disposition: A | Discharge status: Home / Self Care | Payer: 59 | Source: Ambulatory Visit | Attending: Family Medicine | Admitting: Family Medicine

## 2023-04-12 VITALS — BP 131/71 | HR 70 | Ht 64.0 in | Wt 242.2 lb

## 2023-04-12 DIAGNOSIS — M170 Bilateral primary osteoarthritis of knee: Secondary | ICD-10-CM

## 2023-04-12 DIAGNOSIS — I10 Essential (primary) hypertension: Secondary | ICD-10-CM | POA: Diagnosis not present

## 2023-04-12 DIAGNOSIS — R053 Chronic cough: Secondary | ICD-10-CM | POA: Diagnosis not present

## 2023-04-12 DIAGNOSIS — M25571 Pain in right ankle and joints of right foot: Secondary | ICD-10-CM | POA: Diagnosis not present

## 2023-04-12 DIAGNOSIS — Z23 Encounter for immunization: Secondary | ICD-10-CM

## 2023-04-12 DIAGNOSIS — E559 Vitamin D deficiency, unspecified: Secondary | ICD-10-CM

## 2023-04-12 DIAGNOSIS — R7303 Prediabetes: Secondary | ICD-10-CM

## 2023-04-12 DIAGNOSIS — Z1239 Encounter for other screening for malignant neoplasm of breast: Secondary | ICD-10-CM

## 2023-04-12 MED ORDER — WEGOVY 0.25 MG/0.5ML ~~LOC~~ SOAJ: 0.2500 mg | SUBCUTANEOUS | 0 refills | Status: DC

## 2023-04-12 NOTE — Addendum Note (Signed)
Addended by: Manson Passey, Timoteo Carreiro on: 04/12/2023 02:48 PM   Modules accepted: Orders

## 2023-04-12 NOTE — Assessment & Plan Note (Signed)
At goal Had creatinine elevated in September repeat today

## 2023-04-12 NOTE — Assessment & Plan Note (Signed)
Will route to [place] to see if Reginal Lutes can be run through secondary insurance   Carpal Tunnel Previously referred to hand surgery Number given to call

## 2023-04-12 NOTE — Patient Instructions (Addendum)
It was wonderful to see you today.  Please bring ALL of your medications with you to every visit.   Today we talked about:  PT for your knees  Let me know if you want to consider injections  I will message Durward Mallard about your insurance options  An x-ray was ordered for you---you do not need an appointment to have this completed.  I recommend going to [facility] [address] [place]  If the results are normal,I will send you a letter  I will call you with results if anything is abnormal     Call the Hand Doctor  [address] [place], [place] [address] Phone: [phone] . I recommend you undergo a mammogram.   You can call to schedule an appointment by calling [phone].   Directions [address] [place], [place] [address]  Please let me know if you have questions. I will send you a letter or call you with results.      Please follow up in 6 months You are due for your annual exam in April   Thank you for choosing [facility].   Please call [phone] with any questions about today's appointment.  Please be sure to schedule follow up at the front  desk before you leave today.   [name], MD  Family Medicine

## 2023-04-13 LAB — BASIC METABOLIC PANEL
BUN/Creatinine Ratio: 22 (ref 12–28)
BUN: 19 mg/dL (ref 8–27)
CO2: 26 mmol/L (ref 20–29)
Calcium: 9.5 mg/dL (ref 8.7–10.3)
Chloride: 102 mmol/L (ref 96–106)
Creatinine, Ser: 0.88 mg/dL (ref 0.57–1.00)
Glucose: 99 mg/dL (ref 70–99)
Potassium: 3.8 mmol/L (ref 3.5–5.2)
Sodium: 140 mmol/L (ref 134–144)
eGFR: 75 mL/min/{1.73_m2} (ref >59)

## 2023-04-15 ENCOUNTER — Ambulatory Visit: Payer: 59 | Admitting: Licensed Clinical Social Worker

## 2023-04-15 ENCOUNTER — Telehealth: Payer: Self-pay | Admitting: Family Medicine

## 2023-04-15 DIAGNOSIS — I517 Cardiomegaly: Secondary | ICD-10-CM

## 2023-04-15 NOTE — Telephone Encounter (Signed)
Attempted to call patient. Reached voicemail, left generic voicemail to call back.   If patient calls back please let her know that her x-ray shows her lungs look good.  Her heart is slightly enlarged.  This is common and often due to elevated blood pressure or elevated body weight.  I recommend a heart ultrasound this has been ordered.   Nursing please schedule echocardiogram and call patient with time and date  [name], MD  [facility] Medicine Teaching Service

## 2023-04-16 NOTE — Telephone Encounter (Signed)
Patient has been informed of x-ray results and her appointment of her echo. Penni Bombard CMA

## 2023-04-18 ENCOUNTER — Telehealth: Payer: Self-pay

## 2023-04-18 NOTE — Telephone Encounter (Signed)
Pharmacy Patient Advocate Encounter   Received notification from CoverMyMeds that prior authorization for [facility] is required/requested.   The patient is insured through [facility] .   PA required; PA submitted to above mentioned insurance via CoverMyMeds Key/confirmation #/EOC [employer]. Status is pending

## 2023-04-22 ENCOUNTER — Other Ambulatory Visit (HOSPITAL_COMMUNITY): Payer: Self-pay

## 2023-04-22 NOTE — Telephone Encounter (Signed)
Pharmacy Patient Advocate Encounter  Received notification from  [facility]  that Prior Authorization for [facility] has been DENIED.  Full denial letter will be uploaded to the media tab. See denial reason below.  We have received a request for [facility]. You are requesting this medication to use for weight loss. We reviewed the records received and your benefit plan coverage. Based on this information, the requested service is excluded from coverage under the health plan. This is not a covered benefit. Therefore, this request has not been approved.

## 2023-04-26 ENCOUNTER — Other Ambulatory Visit (HOSPITAL_COMMUNITY): Payer: Self-pay

## 2023-04-26 NOTE — Telephone Encounter (Signed)
Pharmacy Patient Advocate Encounter   Received notification from Physician's Office that prior authorization for [facility] 0.25MG  is required/requested.   The patient is insured through [facility] .   PA required; PA submitted to above mentioned insurance via CoverMyMeds Key/confirmation #/EOC [id number]. Status is pending

## 2023-04-26 NOTE — Telephone Encounter (Addendum)
Faxed denial letter from [facility] to [facility], as proof of denial.  Fax: [phone]

## 2023-04-29 ENCOUNTER — Ambulatory Visit: Payer: 59 | Admitting: Licensed Clinical Social Worker

## 2023-04-29 ENCOUNTER — Other Ambulatory Visit (HOSPITAL_COMMUNITY): Payer: Self-pay

## 2023-04-29 DIAGNOSIS — F331 Major depressive disorder, recurrent, moderate: Secondary | ICD-10-CM | POA: Diagnosis not present

## 2023-04-29 DIAGNOSIS — F411 Generalized anxiety disorder: Secondary | ICD-10-CM | POA: Diagnosis not present

## 2023-04-29 NOTE — Progress Notes (Signed)
 Behavioral Health Counselor/Therapist Progress Note  Patient ID: Veronica [name], MRN: [id number]    Date: 04/29/23  Time Spent: 1104  am - 1200 pm : 56 Minutes  Treatment Type: Individual Therapy.  Reported Symptoms: Generalized Anxiety Disorder, Major Depressive Disorder, recurrent Moderate.  Mental Status Exam: Appearance:  Casual     Behavior: Appropriate  Motor: Normal  Speech/Language:  Clear and Coherent  Affect: Appropriate  Mood: normal  Thought process: normal  Thought content:   WNL  Sensory/Perceptual disturbances:   WNL  Orientation: oriented to person, place, time/date, situation, day of week, month of year, and year  Attention: Good  Concentration: Good  Memory: WNL  Fund of knowledge:  Good  Insight:   Good  Judgment:  Good  Impulse Control: Good   Risk Assessment: Danger to Self:  No Self-injurious Behavior: No Danger to Others: No Duty to Warn:no Physical Aggression / Violence:No  Access to Firearms a concern: No  Gang Involvement:No   Subjective:   [name] participated from office, located at [employer] with Clinician present. Sherel consented to treatment.   Subjective: Patient reported her holiday went well with her children and family. She reported that her husband did not engage and stayed home in bed. Patient reports that she finds herself feeling that she does all the work and giving and often wonders when she will reap the benefits of that. Patient acknowledges that she doesn't feel fulfilled in her marriage and she reports that she feels that this is what increases her symptoms of depression and anxiety.   Interventions:  Supportive therapy and problem solving.  Clinician conducted session in person. Clinician provided supportive therapy, active listening, and validation as patient how her husband affects her level of depression and anxiety. Patient states that she cares about her husband but is not in love with him.  Clinician processed with patient the importance of talking with him and possibly encouraging him to seek therapy as he may be struggling with his own symptoms and not be aware of how to seek help. Clinician and patient also discussed that this may also be a way to get him involved in marriage counseling to see if this can improve the dynamic of the relationship and in turn improve patients symptoms.   Diagnosis:Generalized anxiety disorder      Plan: Patient will continue with biweekly therapy sessions. Clinician encouraged patient to utilize the following coping skills:    Physical activity: Regular exercise like walking, running, swimming, or yoga can significantly improve mood and reduce anxiety.  Mindfulness and meditation: Practicing mindfulness techniques like deep breathing or guided meditation can help manage anxious thoughts and promote relaxation.  Progressive muscle relaxation: Tense and then relax different muscle groups in your body to release tension.  Creative expression: Engaging in activities like painting, writing, music, or dancing can be therapeutic.  Lifestyle changes: Healthy diet: Interventions: Cognitive Behavioral Therapy Treatment plan to be reviewed by 01/08/2024.  Diagnosis: Generalized Anxiety Disorder, Major Depressive Disorder, recurrent moderate    Phyllis Ginger MSW, LCSW/DATE 04/29/2023

## 2023-04-29 NOTE — Telephone Encounter (Addendum)
Pharmacy Patient Advocate Encounter  Received notification from [facility] that Prior Authorization for [facility] has been APPROVED from 04/29/23 to 10/28/23  Pharmacy processing fill.

## 2023-05-01 ENCOUNTER — Other Ambulatory Visit (INDEPENDENT_AMBULATORY_CARE_PROVIDER_SITE_OTHER): Payer: Self-pay | Admitting: Family Medicine

## 2023-05-01 DIAGNOSIS — E559 Vitamin D deficiency, unspecified: Secondary | ICD-10-CM

## 2023-05-02 ENCOUNTER — Ambulatory Visit (INDEPENDENT_AMBULATORY_CARE_PROVIDER_SITE_OTHER): Payer: 59 | Admitting: Family Medicine

## 2023-05-03 ENCOUNTER — Ambulatory Visit (HOSPITAL_COMMUNITY): Payer: 59

## 2023-05-04 ENCOUNTER — Other Ambulatory Visit: Payer: Self-pay | Admitting: Rheumatology

## 2023-05-04 DIAGNOSIS — M329 Systemic lupus erythematosus, unspecified: Secondary | ICD-10-CM

## 2023-05-06 ENCOUNTER — Encounter: Payer: Self-pay | Admitting: Family Medicine

## 2023-05-06 NOTE — Telephone Encounter (Signed)
 Last Fill: 04/08/2023 (30 day supply)  Eye exam: 02/20/2023 WNL    Labs: 01/11/2023 Creatinine is mildly elevated.   Next Visit: 05/23/2023  Last Visit: 02/08/2024  DX: Other systemic lupus erythematosus with other organ involvement   Current Dose per office note 02/08/2023: Plaquenil  200 mg p.o. twice daily   Okay to refill Plaquenil ?

## 2023-05-08 ENCOUNTER — Encounter (INDEPENDENT_AMBULATORY_CARE_PROVIDER_SITE_OTHER): Payer: Self-pay | Admitting: Family Medicine

## 2023-05-08 ENCOUNTER — Ambulatory Visit (INDEPENDENT_AMBULATORY_CARE_PROVIDER_SITE_OTHER): Payer: 59 | Admitting: Family Medicine

## 2023-05-08 DIAGNOSIS — E669 Obesity, unspecified: Secondary | ICD-10-CM | POA: Diagnosis not present

## 2023-05-08 DIAGNOSIS — Z6841 Body Mass Index (BMI) 40.0 and over, adult: Secondary | ICD-10-CM | POA: Diagnosis not present

## 2023-05-08 DIAGNOSIS — E559 Vitamin D deficiency, unspecified: Secondary | ICD-10-CM

## 2023-05-08 DIAGNOSIS — R7303 Prediabetes: Secondary | ICD-10-CM | POA: Diagnosis not present

## 2023-05-08 MED ORDER — VITAMIN D (ERGOCALCIFEROL) 1.25 MG (50000 UNIT) PO CAPS: 50000.0000 [IU] | ORAL_CAPSULE | ORAL | 0 refills | Status: DC

## 2023-05-08 NOTE — Progress Notes (Signed)
 Veronica [name], D.O.  ABFM, ABOM Specializing in Clinical Bariatric Medicine  Office located at: [address] [name], [place]  [address]   Assessment and Plan:   Obtain A1C and vitamin D  at next visit.   FOR THE DISEASE OF OBESITY: Obesity with starting BMI of 40.5 BMI 40.0-44.9, adult (HCC) - current BMI 40.83 Since last office visit on 04/09/23 patient's muscle mass has decreased by 1.6lb. Fat mass has increased by 4.6lb. Total body water has increased by 1.4lb.  Counseling done on how various foods will affect these numbers and how to maximize success  Total lbs lost to date: -2 lbs.  Total weight loss percentage to date: 0.85%  Veronica [name] was started on Wegovy  at her last follow up with PCP on 12/13 after multiple previous discussions. Pt started at 0.25 mg last week. So far, pt is tolerating well. Reports feeling ill when eating off plan, yesterday when eating a chicken salad sandwich with regular bread and mayo.   Avoid skipping meals and eating sugary foods. I recommend she take it on Thursday for better effectiveness over the weekends. Reviewed the side effects of eating off plan (fast, carbs) while on Wegovy . Discusses recipes and strategies for healthy alternatives when at social events/plan. Continue with current regimen as directed by PCP.    Recommended Dietary Goals Veronica [name] is currently in the action stage of change. As such, her goal is to continue weight management plan.  She has agreed to: continue current plan    Behavioral Intervention We discussed the following today: increasing lean protein intake to established goals, avoiding skipping meals, decreasing eating out or consumption of processed foods, and making healthy choices when eating convenient foods, practice mindfulness eating and understand the difference between hunger signals and cravings, and continue to work on maintaining a reduced calorie state, getting the recommended amount of protein,  incorporating whole foods, making healthy choices, staying well hydrated and practicing mindfulness when eating.  Additional resources provided today: None, Handout on CAT 1-2 breakfast options, and Handout on CAT 1-2 lunch options  Evidence-based interventions for health behavior change were utilized today including the discussion of self monitoring techniques, problem-solving barriers and SMART goal setting techniques.   Regarding patient's less desirable eating habits and patterns, we employed the technique of small changes.   Pt will specifically work on: Follow meal plan by eating on plan and avoid skipping meals for next visit.    Recommended Physical Activity Goals Veronica [name] has been advised to work up to 150 minutes of moderate intensity aerobic activity a week and strengthening exercises 2-3 times per week for cardiovascular health, weight loss maintenance and preservation of muscle mass.   She has agreed to :  Increase physical activity in their day and reduce sedentary time (increase NEAT).   Pharmacotherapy We discussed various medication options to help Veronica [name] with her weight loss efforts and we both agreed to : continue with nutritional and behavioral strategies and continue current anti-obesity medication regimen   FOR ASSOCIATED CONDITIONS ADDRESSED TODAY:  Prediabetes Assessment & Plan: Lab Results  Component Value Date   HGBA1C 6.2 (H) 02/28/2023   HGBA1C 5.7 08/03/2022   HGBA1C 5.8 09/01/2021   INSULIN  27.6 (H) 02/28/2023   INSULIN  23.1 08/03/2022   A1C as of 02/28/23 was above goal at 6.2. Previous readings were 5.8 and 5.7 in 08/2021 and 07/2022 respectively. Pt reports recently starting Wegovy  with PCP on 12/13 (see above in obesity A&P). She reports skipped meals over  the holidays, especially when going to her mother's house for Christmas dinner.   Encouraged pt to eat on plan, increase her daily protein intake, and increase exercise. Reviewed recipes and  alternative healthy options when eating out. Advised pt to avoid skipping meals. Continue regimen per PCP. Will recheck A1C at her next office visit.    Vitamin D  deficiency Assessment & Plan: Lab Results  Component Value Date   VD25OH 34 01/11/2023   Last vitamin D  was below goal at 34 as of 12/2022. Pt taking ERGO once weekly. Tolerating well, no side effects. No acute concerns reported today. Continue with current supplementation regimen. Will recheck vitamin D  levels at next visit.   Orders: - Refill ERGO today, no dose changes.    Follow up:   Return in about 19 days (around 05/27/2023). She was informed of the importance of frequent follow up visits to maximize her success with intensive lifestyle modifications for her multiple health conditions.  Subjective:   Chief complaint: Obesity Veronica [name] is here to discuss her progress with her obesity treatment plan. She is on the Category 1 Plan with lunch options and states she is following her eating plan approximately 50% of the time. She states she is not exercising.  Interval History:  Veronica [name] is here for a follow up office visit. Since last OV, she is up 3 lbs. Reports off-plan eating over the holidays. Has gotten back on plan after New Year's but feels she is not fully compliant just yet. Endorses skipped meals, and recalls that she skipped lunch prior to a dinner at her mother's house when celebrating Christmas.    Barriers identified: [employer], skipping meals.   Pharmacotherapy for weight loss: She is currently taking Wegovy  with adequate clinical response  and without side effects. and Topiramate  (off label use, single agent) with adequate clinical response  and without side effects..   Review of Systems:  Pertinent positives were addressed with patient today.  Reviewed by clinician on day of visit: allergies, medications, problem list, medical history, surgical history, family history, social history,  and previous encounter notes.  Weight Summary and Biometrics   Weight Lost Since Last Visit: 0lb  Weight Gained Since Last Visit: 3lb   Vitals Temp: 98 F (36.7 C) BP: (!) 148/85 Pulse Rate: 67 SpO2: 97 %   Anthropometric Measurements Height: 5' 4 (1.626 m) Weight: 238 lb (108 kg) BMI (Calculated): 40.83 Weight at Last Visit: 235lb Weight Lost Since Last Visit: 0lb Weight Gained Since Last Visit: 3lb Starting Weight: 236lb Total Weight Loss (lbs): 0 lb (0 kg) Peak Weight: 236lb   Body Composition  Body Fat %: 48.5 % Fat Mass (lbs): 115.8 lbs Muscle Mass (lbs): 116.6 lbs Total Body Water (lbs): 80.8 lbs Visceral Fat Rating : 16   Other Clinical Data Fasting: no Labs: no Today's Visit #: 4 Starting Date: 02/28/23    Objective:   PHYSICAL EXAM: Blood pressure (!) 148/85, pulse 67, temperature 98 F (36.7 C), height 5' 4 (1.626 m), weight 238 lb (108 kg), SpO2 97%. Body mass index is 40.85 kg/m.  General: she is overweight, cooperative and in no acute distress. PSYCH: Has normal mood, affect and thought process.   HEENT: EOMI, sclerae are anicteric. Lungs: Normal breathing effort, no conversational dyspnea. Extremities: Moves * 4 Neurologic: A and O * 3, good insight  DIAGNOSTIC DATA REVIEWED: BMET    Component Value Date/Time   NA 140 04/12/2023 1134   K 3.8 04/12/2023 1134  CL 102 04/12/2023 1134   CO2 26 04/12/2023 1134   GLUCOSE 99 04/12/2023 1134   GLUCOSE 85 01/11/2023 1057   BUN 19 04/12/2023 1134   CREATININE 0.88 04/12/2023 1134   CREATININE 1.06 (H) 01/11/2023 1057   CALCIUM  9.5 04/12/2023 1134   GFRNONAA 64 04/15/2020 1121   GFRNONAA 87 06/13/2016 1436   GFRAA 74 04/15/2020 1121   GFRAA >89 06/13/2016 1436   Lab Results  Component Value Date   HGBA1C 6.2 (H) 02/28/2023   HGBA1C 6.2 (H) 02/11/2009   Lab Results  Component Value Date   INSULIN  27.6 (H) 02/28/2023   INSULIN  23.1 08/03/2022   Lab Results  Component Value  Date   TSH 1.54 01/11/2023   CBC    Component Value Date/Time   WBC 5.5 01/11/2023 1057   RBC 4.52 01/11/2023 1057   HGB 12.6 01/11/2023 1057   HGB 12.5 08/03/2022 1210   HCT 39.0 01/11/2023 1057   HCT 40.0 08/03/2022 1210   PLT 344 01/11/2023 1057   PLT 350 08/03/2022 1210   MCV 86.3 01/11/2023 1057   MCV 84 08/03/2022 1210   MCH 27.9 01/11/2023 1057   MCHC 32.3 01/11/2023 1057   RDW 12.1 01/11/2023 1057   RDW 12.4 08/03/2022 1210   Iron Studies    Component Value Date/Time   IRON 114 05/18/2008 0405   TIBC 300 05/18/2008 0405   FERRITIN 19 05/18/2008 0405   IRONPCTSAT 38 05/18/2008 0405   Lipid Panel     Component Value Date/Time   CHOL 153 02/28/2023 1110   TRIG 60 02/28/2023 1110   HDL 54 02/28/2023 1110   CHOLHDL 3.1 04/09/2022 1106   CHOLHDL 4.4 06/13/2016 1436   VLDL 30 06/13/2016 1436   LDLCALC 87 02/28/2023 1110   Hepatic Function Panel     Component Value Date/Time   PROT 8.0 01/11/2023 1057   PROT 7.6 08/03/2022 1210   ALBUMIN 4.1 08/03/2022 1210   AST 24 01/11/2023 1057   ALT 23 01/11/2023 1057   ALKPHOS 61 08/03/2022 1210   BILITOT 0.5 01/11/2023 1057   BILITOT 0.3 08/03/2022 1210   BILIDIR 3.7 (H) 07/28/2012 1200   IBILI 1.7 (H) 07/28/2012 1200      Component Value Date/Time   TSH 1.54 01/11/2023 1057   Nutritional Lab Results  Component Value Date   VD25OH 34 01/11/2023    Attestations:   LILLETTE Vernell Forest, acting as a medical scribe for Veronica Jenkins, DO., have compiled all relevant documentation for today's office visit on behalf of Veronica Jenkins, DO, while in the presence of [employer], DO.  Reviewed by clinician on day of visit: allergies, medications, problem list, medical history, surgical history, family history, social history, and previous encounter notes pertinent to patient's obesity diagnosis.  I have reviewed the above documentation for accuracy and completeness, and I agree with the above. Veronica Veronica [name],  D.O.  The 21st Century Cures Act was signed into law in 2016 which includes the topic of electronic health records.  This provides immediate access to information in MyChart.  This includes consultation notes, operative notes, office notes, lab results and pathology reports.  If you have any questions about what you read please let us  know at your next visit so we can discuss your concerns and take corrective action if need be.  We are right here with you.

## 2023-05-13 ENCOUNTER — Ambulatory Visit (INDEPENDENT_AMBULATORY_CARE_PROVIDER_SITE_OTHER): Payer: 59 | Admitting: Licensed Clinical Social Worker

## 2023-05-13 DIAGNOSIS — F331 Major depressive disorder, recurrent, moderate: Secondary | ICD-10-CM | POA: Diagnosis not present

## 2023-05-13 DIAGNOSIS — F411 Generalized anxiety disorder: Secondary | ICD-10-CM

## 2023-05-13 NOTE — Progress Notes (Signed)
Office Visit Note  Patient: Veronica [name]             Date of Birth: 1961/04/09           MRN: [id number]             PCP: Westley Chandler, MD Referring: Westley Chandler, MD Visit Date: 05/23/2023 Occupation: @GUAROCC @  Subjective:  Left hip pain   History of Present Illness: Veronica [name] is a 63 y.o. female with systemic lupus, osteoarthritis and fibromyalgia.  She remains on Plaquenil 200 mg p.o. twice daily since 2000 and prescribed by her previous rheumatologist.  She recently has been experiencing pain in her left hip (which she describes over the trochanteric region) and having difficulty sleeping on the left side.  She denies pain and discomfort in any other joints.  He states her mouth feels dry at night.  Otherwise she does not have any sicca symptoms.  She denies any history of oral ulcers, nasal ulcers, malar rash, photosensitivity, Raynaud's, lymphadenopathy or inflammatory arthritis.  She denies any history of palpitations or shortness of breath.  There is no history of skin rashes.    Activities of Daily Living:  Patient reports morning stiffness for a few minutes.   Patient Denies nocturnal pain.  Difficulty dressing/grooming: Denies Difficulty climbing stairs: Reports Difficulty getting out of chair: Denies Difficulty using hands for taps, buttons, cutlery, and/or writing: Denies  Review of Systems  Constitutional:  Positive for fatigue.  HENT:  Negative for mouth sores and mouth dryness.   Eyes:  Negative for pain and dryness.  Respiratory:  Negative for shortness of breath and difficulty breathing.   Cardiovascular:  Negative for chest pain and palpitations.  Gastrointestinal:  Positive for constipation. Negative for blood in stool and diarrhea.  Endocrine: Negative for increased urination.  Genitourinary:  Negative for involuntary urination.  Musculoskeletal:  Positive for joint swelling and morning stiffness. Negative for joint pain, gait problem, joint  pain, myalgias, muscle weakness, muscle tenderness and myalgias.  Skin:  Negative for color change, rash, hair loss and sensitivity to sunlight.  Allergic/Immunologic: Negative for susceptible to infections.  Neurological:  Negative for dizziness, numbness and headaches.  Hematological:  Negative for swollen glands.  Psychiatric/Behavioral:  Positive for depressed mood and sleep disturbance. The patient is nervous/anxious.     PMFS History:  Patient Active Problem List   Diagnosis Date Noted   Vitamin D deficiency 03/14/2023   OSA (obstructive sleep apnea) 02/28/2023   Major depressive disorder, recurrent episode, moderate (HCC) 01/08/2023   Class 2 severe obesity due to excess calories with serious comorbidity and body mass index (BMI) of 39.0 to 39.9 in adult [facility]) 12/20/2022   Esophageal dysphagia 04/15/2020   Mood disorder (HCC) 11/13/2019   Atherosclerosis of aorta (HCC) 07/15/2019   Prediabetes 07/13/2019   Chronic abdominal pain 07/13/2019   Generalized anxiety disorder 05/15/2019   Hyperlipidemia 06/14/2015   Insomnia 12/15/2012   Essential hypertension, benign 04/10/2012   SLE (systemic lupus erythematosus) (HCC) 04/10/2012    Past Medical History:  Diagnosis Date   Anxiety    Bilateral swelling of feet    Chest pain    Chewing difficulty    Constipation    Daily headache    Depression    Diverticulitis    Fibromyalgia    G6PD deficiency    outside labs--low level, no longer on HCQ   Glucose 6 phosphatase deficiency (HCC)    High cholesterol    History  of cholelithiasis    History of colon polyps    Hypertension    Joint pain    Mini stroke    Osteoarthritis    Prediabetes    Rheumatoid arthritis (HCC)    SLE (systemic lupus erythematosus) (HCC) 1998   SOB (shortness of breath)    Swallowing difficulty     Family History  Problem Relation Age of Onset   Hypertension Mother    Diabetes Mother    Bladder Cancer Mother    Sleep apnea Mother     Hypertension Father    Kidney disease Father    Diabetes Sister    Multiple sclerosis Sister    Diabetes Brother    Hypertension Brother    Healthy Son    Healthy Son    Past Surgical History:  Procedure Laterality Date   ABDOMINAL HYSTERECTOMY  09/2008   CESAREAN SECTION  1992, 1995   CHOLECYSTECTOMY N/A 07/18/2012   Procedure: LAPAROSCOPIC CHOLECYSTECTOMY WITH INTRAOPERATIVE CHOLANGIOGRAM;  Surgeon: Atilano Ina, MD;  Location: [facility] OR;  Service: General;  Laterality: N/A;   COLONOSCOPY     ERCP N/A 07/29/2012   Procedure: ENDOSCOPIC RETROGRADE CHOLANGIOPANCREATOGRAPHY (ERCP);  Surgeon: Theda Belfast, MD;  Location: [facility];  Service: Endoscopy;  Laterality: N/A;  Dr. Elnoria Howard said he would start this PT arond 1330( AW)   INCISION AND DRAINAGE ABSCESS Right 12/02/2015   Procedure: INCISION AND DRAINAGE ABSCESS;  Surgeon: Jimmye Norman, MD;  Location: [facility] OR;  Service: General;  Laterality: Right;  Right axillary abscess   TUBAL LIGATION  1995   Social History   Social History Narrative   Pt lives with spouse. Some college.    Works 7 days on then 7 days off at place of work       Patient is right-handed. She lives with her husband in a one level home. She occasionally drinks soda. She does not exercise.    Immunization History  Administered Date(s) Administered   Influenza, Seasonal, Injecte, Preservative Fre 04/12/2023   Influenza,inj,Quad PF,6+ Mos 01/09/2013, 02/21/2015, 02/07/2016, 01/09/2017, 02/18/2018, 01/25/2020, 01/06/2021, 01/16/2022   PFIZER(Purple Top)SARS-COV-2 Vaccination 05/11/2019, 06/05/2019, 02/13/2020   PNEUMOCOCCAL CONJUGATE-20 01/30/2021   PPD Test 09/02/2012, 11/03/2013, 10/08/2014, 11/28/2015, 02/25/2017, 02/18/2018, 01/25/2020, 01/30/2021, 04/09/2022   Pfizer Covid-19 Theatre manager 72yrs & up 05/22/2021   Pfizer(Comirnaty)Fall Seasonal Vaccine 12 years and older 04/09/2022, 04/12/2023   Tdap 09/02/2012     Objective: Vital Signs: BP 124/84  (BP Location: Left Arm, Patient Position: Sitting, Cuff Size: Large)   Pulse 67   Resp 15   Ht 5\' 4"  (1.626 m)   Wt 239 lb 12.8 oz (108.8 kg)   BMI 41.16 kg/m    Physical Exam Vitals and nursing note reviewed.  Constitutional:      Appearance: She is well-developed.  HENT:     Head: Normocephalic and atraumatic.  Eyes:     Conjunctiva/sclera: Conjunctivae normal.  Cardiovascular:     Rate and Rhythm: Normal rate and regular rhythm.     Heart sounds: Normal heart sounds.  Pulmonary:     Effort: Pulmonary effort is normal.     Breath sounds: Normal breath sounds.  Abdominal:     General: Bowel sounds are normal.     Palpations: Abdomen is soft.  Musculoskeletal:     Cervical back: Normal range of motion.  Lymphadenopathy:     Cervical: No cervical adenopathy.  Skin:    General: Skin is warm and dry.     Capillary  Refill: Capillary refill takes less than 2 seconds.  Neurological:     Mental Status: She is alert and oriented to person, place, and time.  Psychiatric:        Behavior: Behavior normal.      Musculoskeletal Exam: Cervical spine was in good range of motion.  She had good range of motion of thoracic and lumbar spine without any discomfort.  Shoulders, elbows, wrists, MCPs PIPs and DIPs Juengel range of motion with no synovitis.  Hip joints and knee joints in good range of motion.  She had tenderness on palpation of her left trochanteric bursa.  There was no tenderness over ankles or MTPs.  CDAI Exam: CDAI Score: -- Patient Global: --; Provider Global: -- Swollen: --; Tender: -- Joint Exam 05/23/2023   No joint exam has been documented for this visit   There is currently no information documented on the homunculus. Go to the Rheumatology activity and complete the homunculus joint exam.  Investigation: No additional findings.  Imaging: ECHOCARDIOGRAM COMPLETE Result Date: 05/16/2023    ECHOCARDIOGRAM REPORT   Patient Name:   Veronica [name] Date of Exam:  05/16/2023 Medical Rec #:  [id number]        Height:       64.0 in Accession #:    [id number]       Weight:       238.0 lb Date of Birth:  Oct 14, 1960        BSA:          2.106 m Patient Age:    62 years         BP:           132/85 mmHg Patient Gender: F                HR:           64 bpm. Exam Location:  Inpatient Procedure: 2D Echo, Color Doppler and Cardiac Doppler Indications:    Cardiomegaly  History:        Patient has no prior history of Echocardiogram examinations.                 Risk Factors:Hypertension, Diabetes and HLD.  Sonographer:    Webb Laws Referring Phys: [id number] CARINA M BROWN IMPRESSIONS  1. Left ventricular ejection fraction, by estimation, is 60 to 65%. The left ventricle has normal function. The left ventricle has no regional wall motion abnormalities. There is moderate asymmetric left ventricular hypertrophy of the basal-septal segment. Left ventricular diastolic parameters were normal.  2. Right ventricular systolic function is normal. The right ventricular size is normal. There is normal pulmonary artery systolic pressure. The estimated right ventricular systolic pressure is 28.4 mmHg.  3. The mitral valve is normal in structure. No evidence of mitral valve regurgitation. No evidence of mitral stenosis.  4. The aortic valve is tricuspid. Aortic valve regurgitation is not visualized. No aortic stenosis is present.  5. The inferior vena cava is normal in size with greater than 50% respiratory variability, suggesting right atrial pressure of 3 mmHg. FINDINGS  Left Ventricle: Left ventricular ejection fraction, by estimation, is 60 to 65%. The left ventricle has normal function. The left ventricle has no regional wall motion abnormalities. The left ventricular internal cavity size was normal in size. There is  moderate asymmetric left ventricular hypertrophy of the basal-septal segment. Left ventricular diastolic parameters were normal. Right Ventricle: The right ventricular size is  normal. No increase in right ventricular wall thickness. Right  ventricular systolic function is normal. There is normal pulmonary artery systolic pressure. The tricuspid regurgitant velocity is 2.52 m/s, and  with an assumed right atrial pressure of 3 mmHg, the estimated right ventricular systolic pressure is 28.4 mmHg. Left Atrium: Left atrial size was normal in size. Right Atrium: Right atrial size was normal in size. Pericardium: There is no evidence of pericardial effusion. Mitral Valve: The mitral valve is normal in structure. No evidence of mitral valve regurgitation. No evidence of mitral valve stenosis. Tricuspid Valve: The tricuspid valve is normal in structure. Tricuspid valve regurgitation is trivial. Aortic Valve: The aortic valve is tricuspid. Aortic valve regurgitation is not visualized. No aortic stenosis is present. Pulmonic Valve: The pulmonic valve was grossly normal. Pulmonic valve regurgitation is trivial. Aorta: The aortic root is normal in size and structure. Venous: The inferior vena cava is normal in size with greater than 50% respiratory variability, suggesting right atrial pressure of 3 mmHg. IAS/Shunts: The interatrial septum was not well visualized.  LEFT VENTRICLE PLAX 2D LVIDd:         4.00 cm     Diastology LVIDs:         2.40 cm     LV e' medial:    5.55 cm/s LV PW:         0.90 cm     LV E/e' medial:  12.0 LV IVS:        1.50 cm     LV e' lateral:   11.20 cm/s                            LV E/e' lateral: 5.9  LV Volumes (MOD) LV vol d, MOD A2C: 39.8 ml LV vol d, MOD A4C: 40.8 ml LV vol s, MOD A2C: 13.6 ml LV vol s, MOD A4C: 15.5 ml LV SV MOD A2C:     26.2 ml LV SV MOD A4C:     40.8 ml LV SV MOD BP:      26.1 ml RIGHT VENTRICLE             IVC RV Basal diam:  2.20 cm     IVC diam: 1.70 cm RV S prime:     11.40 cm/s TAPSE (M-mode): 1.1 cm LEFT ATRIUM             Index        RIGHT ATRIUM           Index LA diam:        4.10 cm 1.95 cm/m   RA Area:     10.88 cm LA Vol (A2C):   10.3 ml  4.89 ml/m   RA Volume:   19.95 ml  9.47 ml/m LA Vol (A4C):   28.2 ml 13.39 ml/m LA Biplane Vol: 17.7 ml 8.40 ml/m  AORTIC VALVE             PULMONIC VALVE LVOT Vmax:   106.00 cm/s PR End Diast Vel: 1.01 msec LVOT Vmean:  66.500 cm/s LVOT VTI:    0.203 m  AORTA Ao Root diam: 2.50 cm Ao Asc diam:  3.70 cm MITRAL VALVE               TRICUSPID VALVE MV Area (PHT): 3.77 cm    TR Peak grad:   25.4 mmHg MV Decel Time: 201 msec    TR Vmax:        252.00 cm/s MV E velocity: 66.60 cm/s  MV A velocity: 67.10 cm/s  SHUNTS MV E/A ratio:  0.99        Systemic VTI: 0.20 m Epifanio Lesches MD Electronically signed by Epifanio Lesches MD Signature Date/Time: 05/16/2023/12:17:40 PM    Final     Recent Labs: Lab Results  Component Value Date   WBC 5.5 01/11/2023   HGB 12.6 01/11/2023   PLT 344 01/11/2023   NA 140 04/12/2023   K 3.8 04/12/2023   CL 102 04/12/2023   CO2 26 04/12/2023   GLUCOSE 99 04/12/2023   BUN 19 04/12/2023   CREATININE 0.88 04/12/2023   BILITOT 0.5 01/11/2023   ALKPHOS 61 08/03/2022   AST 24 01/11/2023   ALT 23 01/11/2023   PROT 8.0 01/11/2023   ALBUMIN 4.1 08/03/2022   CALCIUM 9.5 04/12/2023   GFRAA 74 04/15/2020   February 08, 2023 myositis panel negative, HMG CR antibody negative protein creatinine ratio normal, CK1 55, aldolase 3.6, C3-C4 normal  January 11, 2023 ANA 1: 320 NS, 1: 320 cytoplasmic, RNP 2.6, SSA> 8.0, SSB negative, dsDNA negative, Smith negative, SCL 70 negative, beta-2 GP 1 negative, anticardiolipin IgA 22.2, lupus anticoagulant negative, ESR 6, RF negative, anti-CCP negative, uric acid 4.0, vitamin D 34, TSH 1.54,  CK247  Speciality Comments: PLQ Eye Exam: 02/20/2023 WNL @ [facility] Follow up in 1 year  Plaquenil since 2000, methotrexate-GI side effects and fatigue  Procedures:  Large Joint Inj: L greater trochanter on 05/23/2023 2:51 PM Indications: pain Details: 27 G 1.5 in needle, lateral approach  Arthrogram:  No  Medications: 40 mg triamcinolone acetonide 40 MG/ML; 1.5 mL lidocaine 1 % Aspirate: 0 mL Outcome: tolerated well, no immediate complications Procedure, treatment alternatives, risks and benefits explained, specific risks discussed. Consent was given by the patient. Immediately prior to procedure a time out was called to verify the correct patient, procedure, equipment, support staff and site/side marked as required. Patient was prepped and draped in the usual sterile fashion.     Allergies: Amlodipine and Tessalon [benzonatate]   Assessment / Plan:     Visit Diagnoses: Other systemic lupus erythematosus with other organ involvement (HCC) - + ANA, +Ro, + RNP (previously +dsDNA and +RF)h/o arthritis, malar rash, pleural effusion.dxd 1998 Dr. Kellie Simmering later [name] and Mr. Fredderick Phenix.  Patient denies any history of oral ulcers, nasal ulcers, sicca symptoms, malar rash, photosensitivity, Raynaud's, lymphadenopathy or inflammatory arthritis.  She denies any history of palpitations or shortness of breath.  She recalls having EKG in the past and had recent echocardiogram.  She understands the association of ILD and lymphoma with SSA antibody.  There is no history of skin rashes.  She has been doing well on hydroxychloroquine 200 mg p.o. twice daily.  High risk medication use - Plaquenil 200 mg p.o. twice daily since 2000.  Plaquenil eye examination February 20, 2023 at [facility].  Methotrexate-fatigue and GI side effects.  Information on immunization was placed in the AVS.  G6PD deficiency  Pleuritis - 1998 and 2008 treated with prednisone for 3 years in 1998 by Dr. Kellie Simmering  Pain in both hands -she had no synovitis on the examination.  Previous x-rays showed osteoarthritic changes.  Carpal tunnel syndrome, right upper limb - Status post left carpal tunnel release.  Trochanteric bursitis, left hip -she had tenderness over left trochanteric bursa.  She has been having difficulty sleeping on the  left side.  After informed consent was obtained left trochanteric bursa was injected with lidocaine and Kenalog as  described above.  She tolerated the procedure well.  Postprocedure instructions were given.  Handout on IT band stretches was given.  Chronic pain of both knees -she continues to have some discomfort in her knee joints.  No warmth swelling or effusion was noted.  Previous x-rays showed moderate osteoarthritis and chondromalacia patella bilaterally.  X-ray findings were reviewed with the patient.  Pain in both feet - Previous x-rays showed osteoarthritic changes.  X-ray findings were reviewed with the patient.  Fibromyalgia - She is on meloxicam and uses topical Pennsaid.  Side effects of NSAIDs including the risk of GI bleed, hepatic and renal toxicity was discussed.  Need for regular exercise and stretching was discussed.  Elevated CK - CK is mildly elevated, aldolase normal, myositis panel negative, HMG CR antibody negative.  Lab results were reviewed with the patient.  Other fatigue-related to insomnia and fibromyalgia.  Other insomnia-good sleep hygiene was discussed.  Other medical problems are listed as follows:  Prediabetes  Essential hypertension, benign  Dyslipidemia  Esophageal dysphagia  Mood disorder (HCC)  Vitamin D deficiency  Family history of multiple sclerosis-Sister  Orders: No orders of the defined types were placed in this encounter.  No orders of the defined types were placed in this encounter.    Follow-Up Instructions: Return in about 5 months (around 10/21/2023) for Systemic lupus.   Pollyann Savoy, MD  Note - This record has been created using Animal nutritionist.  Chart creation errors have been sought, but may not always  have been located. Such creation errors do not reflect on  the standard of medical care.
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
## 2023-05-15 ENCOUNTER — Ambulatory Visit: Payer: 59 | Attending: Family Medicine | Admitting: Physical Therapy

## 2023-05-15 ENCOUNTER — Other Ambulatory Visit: Payer: Self-pay

## 2023-05-15 ENCOUNTER — Encounter: Payer: Self-pay | Admitting: Physical Therapy

## 2023-05-15 DIAGNOSIS — M25562 Pain in left knee: Secondary | ICD-10-CM | POA: Diagnosis present

## 2023-05-15 DIAGNOSIS — M6281 Muscle weakness (generalized): Secondary | ICD-10-CM | POA: Insufficient documentation

## 2023-05-15 DIAGNOSIS — M25561 Pain in right knee: Secondary | ICD-10-CM | POA: Insufficient documentation

## 2023-05-15 DIAGNOSIS — M170 Bilateral primary osteoarthritis of knee: Secondary | ICD-10-CM | POA: Diagnosis not present

## 2023-05-15 DIAGNOSIS — R2681 Unsteadiness on feet: Secondary | ICD-10-CM | POA: Diagnosis present

## 2023-05-15 NOTE — Therapy (Signed)
 OUTPATIENT PHYSICAL THERAPY LOWER EXTREMITY EVALUATION  Patient Name: Veronica [name] MRN: [id number] DOB:Oct 22, 1960, 63 y.o., female Today's Date: 05/15/2023   PT End of Session - 05/15/23 1114     Visit Number 1    Number of Visits --   1-2x/week   Date for PT Re-Evaluation 07/10/23    Authorization Type [employer]    Authorization Time Period auth required after 5th visit for oscar    PT Start Time 0915    PT Stop Time 0952    PT Time Calculation (min) 37 min             Past Medical History:  Diagnosis Date   Anxiety    Bilateral swelling of feet    Chest pain    Chewing difficulty    Constipation    Daily headache    Depression    Diverticulitis    Fibromyalgia    G6PD deficiency    outside labs--low level, no longer on HCQ   Glucose 6 phosphatase deficiency (HCC)    High cholesterol    History of cholelithiasis    History of colon polyps    Hypertension    Joint pain    Mini stroke    Osteoarthritis    Prediabetes    Rheumatoid arthritis (HCC)    SLE (systemic lupus erythematosus) (HCC) 1998   SOB (shortness of breath)    Swallowing difficulty    Past Surgical History:  Procedure Laterality Date   ABDOMINAL HYSTERECTOMY  09/2008   CESAREAN SECTION  1992, 1995   CHOLECYSTECTOMY N/A 07/18/2012   Procedure: LAPAROSCOPIC CHOLECYSTECTOMY WITH INTRAOPERATIVE CHOLANGIOGRAM;  Surgeon: Fran Imus, MD;  Location: [facility] OR;  Service: General;  Laterality: N/A;   COLONOSCOPY     ERCP N/A 07/29/2012   Procedure: ENDOSCOPIC RETROGRADE CHOLANGIOPANCREATOGRAPHY (ERCP);  Surgeon: Almeda Aris, MD;  Location: [facility];  Service: Endoscopy;  Laterality: N/A;  Dr. Nickey Barn said he would start this PT arond 1330( AW)   INCISION AND DRAINAGE ABSCESS Right 12/02/2015   Procedure: INCISION AND DRAINAGE ABSCESS;  Surgeon: Jerryl Morin, MD;  Location: [facility] OR;  Service: General;  Laterality: Right;  Right axillary abscess   TUBAL LIGATION  1995   Patient Active  Problem List   Diagnosis Date Noted   Vitamin D  deficiency 03/14/2023   OSA (obstructive sleep apnea) 02/28/2023   Major depressive disorder, recurrent episode, moderate (HCC) 01/08/2023   Class 2 severe obesity due to excess calories with serious comorbidity and body mass index (BMI) of 39.0 to 39.9 in adult (HCC) 12/20/2022   Esophageal dysphagia 04/15/2020   Mood disorder (HCC) 11/13/2019   Atherosclerosis of aorta (HCC) 07/15/2019   Prediabetes 07/13/2019   Chronic abdominal pain 07/13/2019   Generalized anxiety disorder 05/15/2019   Hyperlipidemia 06/14/2015   Insomnia 12/15/2012   Essential hypertension, benign 04/10/2012   SLE (systemic lupus erythematosus) (HCC) 04/10/2012    PCP: Azell Boll, MD  REFERRING PROVIDER: Azell Boll, MD  THERAPY DIAG:  Pain in both knees, unspecified chronicity  Muscle weakness  Unsteadiness on feet  REFERRING DIAG: Primary osteoarthritis of both knees [M17.0]   Rationale for Evaluation and Treatment:  Rehabilitation  SUBJECTIVE:  PERTINENT PAST HISTORY:  Lupus, anxiety      PRECAUTIONS: None  WEIGHT BEARING RESTRICTIONS No  FALLS:  Has patient fallen in last 6 months? Yes, Number of falls: "trip" rolled ankle and fell into car.  She feels like she trips often.  MOI/History of condition:  Onset date: >2 years  SUBJECTIVE STATEMENT  Veronica [name] is a 63 y.o. female who presents to clinic with chief complaint of bil knee pain which started slowly about 2 years ago.  She was recently told she has OA.  She has difficulty going up and down steps, transferring after sitting for a long periods, when first waking up in the morning.    Red flags:  denies   Pain:  Are you having pain? 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  Assistive Device: na  Hand Dominance: na  Patient Goals/Specific Activities: reduce pain   OBJECTIVE:   DIAGNOSTIC FINDINGS:  X-ray shows moderate OA bil knees  GENERAL OBSERVATION/GAIT: Slight antalgic gait  SENSATION: Light touch: Appears intact  MUSCLE LENGTH: Hamstrings: Right no restriction; Left no restriction Ely's test: Right subtle restriction; Left subtle restriction  LE MMT:  MMT Right (Eval) Left (Eval)  Hip flexion (L2, L3) 4 4  Knee extension (L3) 4* 4*  Knee flexion 4 4  Hip abduction    Hip extension    Hip external rotation    Hip internal rotation    Hip adduction    Ankle dorsiflexion (L4)    Ankle plantarflexion (S1)    Ankle inversion    Ankle eversion    Great Toe ext (L5)    Grossly     (Blank rows = not tested, score listed is out of 5 possible points.  N = WNL, D = diminished, C = clear for gross weakness with myotome testing, * = concordant pain with testing)  LE ROM:  ROM Right (Eval) Left (Eval)  Hip flexion    Hip extension    Hip abduction    Hip adduction    Hip internal rotation    Hip external rotation    Knee extension [facility]  Knee flexion [facility]  Ankle dorsiflexion    Ankle plantarflexion    Ankle inversion    Ankle eversion     (Blank rows = not tested, N = WNL, * = concordant pain with testing)  Functional Tests  Eval    30'' STS: 9x  UE used? N w/ pain    10 m max gait speed: 10'',  1.00 m/s, AD: n    Balance <10'' SLS on airex                                                  PATIENT SURVEYS:  FOTO 64 -> 64   TODAY'S TREATMENT: Therapeutic Exercise: Creating, reviewing, and completing below HEP   PATIENT EDUCATION:  POC, diagnosis, prognosis, HEP, and outcome measures.  Pt educated via explanation, demonstration, and handout (HEP).  Pt confirms understanding verbally.   HOME EXERCISE PROGRAM: Access Code:  [email or web address] URL: [email or web address] Date: 05/15/2023 Prepared by: Lesleigh Rash  Exercises - Supine Quad Set  - 3 x daily - 4-7 x weekly - 2 sets - 10 reps - 10 second hold - Active Straight Leg Raise with Quad Set  - 1 x daily - 4-7 x weekly - 3 sets - 10 reps - Hooklying Clamshell with  Resistance  - 1 x daily - 4-7 x weekly - 3 sets - 10 reps - Supine Bridge  - 1 x daily - 4-7 x weekly - 3 sets - 10 reps - 2-3 seconds hold  Treatment priorities   Eval        Quad strength        Higher level balance                                  ASSESSMENT:  CLINICAL IMPRESSION: Veronica [name] is a 63 y.o. female who presents to clinic with signs and sxs consistent with bil knee pain.  Consistent with referring provider impression of mod knee OA.  ROM is maintained with strength deficit. Veronica [name] will benefit from skilled PT to address strength deficits and improve safety and comfort with daily tasks.  OBJECTIVE IMPAIRMENTS: Pain, balance, gait, LE and knee strength  ACTIVITY LIMITATIONS: pain with: transfers, standing, walking, bending, lifting, squatting  PERSONAL FACTORS: See medical history and pertinent history   REHAB POTENTIAL: Good  CLINICAL DECISION MAKING: Stable/uncomplicated  EVALUATION COMPLEXITY: Low   GOALS:   SHORT TERM GOALS: Target date: 06/12/2023   Veronica [name] will be >75% HEP compliant to improve carryover between sessions and facilitate independent management of condition  Evaluation: ongoing Goal status: INITIAL   LONG TERM GOALS: Target date: 07/10/2023   Veronica [name] will improve FOTO score to 70 as a proxy for functional improvement  Evaluation/Baseline: 64 Goal status: INITIAL    2.  Veronica [name] will self report >/= 50% decrease in pain from evaluation to improve function in daily tasks  Evaluation/Baseline: 5/10 max pain Goal status: INITIAL   3.  Veronica [name] will improve 10 meter max gait speed to 1 m/s (.1 m/s MCID) to show functional improvement in  ambulation   Evaluation/Baseline: 1.37 m/s Goal status: INITIAL   Norms:     4.  Veronica [name] will improve 30'' STS (MCID 2) to >/= 12x (w/ UE?: N) to show improved LE strength and improved transfers   Evaluation/Baseline: 9x  w/ UE? N w/ pain Goal status: INITIAL   5.  Veronica [name] will be able to stand for >20'' in SLS on airex stance, to show a significant improvement in balance in order to reduce fall risk   Evaluation/Baseline: <10'' bil Goal status: INITIAL   6.  Veronica [name] will improve the following MMTs to >/= 4+/5 to show improvement in strength:  bil knee flex and ext   Evaluation/Baseline: see chart in note Goal status: INITIAL   PLAN: PT FREQUENCY: 1-2x/week  PT DURATION: 8 weeks  PLANNED INTERVENTIONS:  97164- PT Re-evaluation, 97110-Therapeutic exercises, 97530- Therapeutic activity, [id number]- Neuromuscular re-education, 97535- Self Care, 13086- Manual therapy, [id number]- Gait training, [id number]- Aquatic Therapy, [phone]- Electrical stimulation (manual), [id number]- Vasopneumatic device, [id number]- Traction (mechanical), [id number]- Ionotophoresis 4mg /ml Dexamethasone , Taping, Dry Needling, Joint manipulation, and Spinal manipulation.   Ophia Shamoon PT, DPT 05/15/2023, 11:15 AM

## 2023-05-16 ENCOUNTER — Ambulatory Visit (HOSPITAL_COMMUNITY)
Admission: RE | Admit: 2023-05-16 | Discharge: 2023-05-16 | Disposition: A | Discharge status: Home / Self Care | Payer: 59 | Source: Ambulatory Visit | Attending: Family Medicine | Admitting: Family Medicine

## 2023-05-16 DIAGNOSIS — I10 Essential (primary) hypertension: Secondary | ICD-10-CM | POA: Diagnosis present

## 2023-05-16 DIAGNOSIS — E119 Type 2 diabetes mellitus without complications: Secondary | ICD-10-CM | POA: Insufficient documentation

## 2023-05-16 DIAGNOSIS — I517 Cardiomegaly: Secondary | ICD-10-CM | POA: Diagnosis present

## 2023-05-16 DIAGNOSIS — I119 Hypertensive heart disease without heart failure: Secondary | ICD-10-CM | POA: Insufficient documentation

## 2023-05-16 DIAGNOSIS — E785 Hyperlipidemia, unspecified: Secondary | ICD-10-CM | POA: Diagnosis not present

## 2023-05-16 LAB — ECHOCARDIOGRAM COMPLETE
Area-P 1/2: 3.77 cm2
Calc EF: 63.7 %
S' Lateral: 2.4 cm
Single Plane A2C EF: 65.8 %
Single Plane A4C EF: 62 %

## 2023-05-22 ENCOUNTER — Encounter: Payer: Self-pay | Admitting: Family Medicine

## 2023-05-22 ENCOUNTER — Ambulatory Visit: Payer: 59 | Admitting: Physical Therapy

## 2023-05-22 ENCOUNTER — Encounter: Payer: Self-pay | Admitting: Physical Therapy

## 2023-05-22 DIAGNOSIS — M6281 Muscle weakness (generalized): Secondary | ICD-10-CM

## 2023-05-22 DIAGNOSIS — M25561 Pain in right knee: Secondary | ICD-10-CM | POA: Diagnosis not present

## 2023-05-22 DIAGNOSIS — R2681 Unsteadiness on feet: Secondary | ICD-10-CM

## 2023-05-22 DIAGNOSIS — M25562 Pain in left knee: Secondary | ICD-10-CM

## 2023-05-22 NOTE — Therapy (Signed)
OUTPATIENT PHYSICAL THERAPY LOWER EXTREMITY EVALUATION  Patient Name: Veronica [name] MRN: [id number] DOB:[birth year], 63 y.o., female Today's Date: 05/22/2023   PT End of Session - 05/22/23 1131     Visit Number 2    Number of Visits --   1-2x/week   Date for PT Re-Evaluation 07/10/23    Authorization Type [employer]    Authorization Time Period auth required after 5th visit for oscar    Authorization - Number of Visits 5    PT Start Time 1130    PT Stop Time 1211    PT Time Calculation (min) 41 min             Past Medical History:  Diagnosis Date   Anxiety    Bilateral swelling of feet    Chest pain    Chewing difficulty    Constipation    Daily headache    Depression    Diverticulitis    Fibromyalgia    G6PD deficiency    outside labs--low level, no longer on HCQ   Glucose 6 phosphatase deficiency (HCC)    High cholesterol    History of cholelithiasis    History of colon polyps    Hypertension    Joint pain    Mini stroke    Osteoarthritis    Prediabetes    Rheumatoid arthritis (HCC)    SLE (systemic lupus erythematosus) (HCC) 1998   SOB (shortness of breath)    Swallowing difficulty    Past Surgical History:  Procedure Laterality Date   ABDOMINAL HYSTERECTOMY  09/2008   CESAREAN SECTION  1992, 1995   CHOLECYSTECTOMY N/A 07/18/2012   Procedure: LAPAROSCOPIC CHOLECYSTECTOMY WITH INTRAOPERATIVE CHOLANGIOGRAM;  Surgeon: Atilano Ina, MD;  Location: [facility] OR;  Service: General;  Laterality: N/A;   COLONOSCOPY     ERCP N/A 07/29/2012   Procedure: ENDOSCOPIC RETROGRADE CHOLANGIOPANCREATOGRAPHY (ERCP);  Surgeon: Theda Belfast, MD;  Location: [facility];  Service: Endoscopy;  Laterality: N/A;  Dr. Elnoria Howard said he would start this PT arond 1330( AW)   INCISION AND DRAINAGE ABSCESS Right 12/02/2015   Procedure: INCISION AND DRAINAGE ABSCESS;  Surgeon: Jimmye Norman, MD;  Location[facility] OR;  Service: General;  Laterality: Right;  Right axillary abscess   TUBAL  LIGATION  1995   Patient Active Problem List   Diagnosis Date Noted   Vitamin D deficiency 03/14/2023   OSA (obstructive sleep apnea) 02/28/2023   Major depressive disorder, recurrent episode, moderate (HCC) 01/08/2023   Class 2 severe obesity due to excess calories with serious comorbidity and body mass index (BMI) of 39.0 to 39.9 in adult [facility]) 12/20/2022   Esophageal dysphagia 04/15/2020   Mood disorder (HCC) 11/13/2019   Atherosclerosis of aorta (HCC) 07/15/2019   Prediabetes 07/13/2019   Chronic abdominal pain 07/13/2019   Generalized anxiety disorder 05/15/2019   Hyperlipidemia 06/14/2015   Insomnia 12/15/2012   Essential hypertension, benign 04/10/2012   SLE (systemic lupus erythematosus) (HCC) 04/10/2012    PCP: Westley Chandler, MD  REFERRING PROVIDER: Westley Chandler, MD  THERAPY DIAG:  Pain in both knees, unspecified chronicity  Muscle weakness  Unsteadiness on feet  Acute pain of right knee  REFERRING DIAG: Primary osteoarthritis of both knees [M17.0]   Rationale for Evaluation and Treatment:  Rehabilitation  SUBJECTIVE:  PERTINENT PAST HISTORY:  Lupus, anxiety      PRECAUTIONS: None  WEIGHT BEARING RESTRICTIONS No  FALLS:  Has patient fallen in last 6 months? Yes, Number of falls: "trip" rolled ankle  and fell into car.  She feels like she trips often.  MOI/History of condition:  Onset date: >2 years  SUBJECTIVE STATEMENT  Pt reports that her knees are feeling a little better.  She has been working for the last 7 days so she hasn't been able to complete her HEP as much as she'd like.    Red flags:  denies   Pain:  Are you having pain? 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  Assistive  Device: na  Hand Dominance: na  Patient Goals/Specific Activities: reduce pain   OBJECTIVE:   DIAGNOSTIC FINDINGS:  X-ray shows moderate OA bil knees  GENERAL OBSERVATION/GAIT: Slight antalgic gait  SENSATION: Light touch: Appears intact  MUSCLE LENGTH: Hamstrings: Right no restriction; Left no restriction Ely's test: Right subtle restriction; Left subtle restriction  LE MMT:  MMT Right (Eval) Left (Eval)  Hip flexion (L2, L3) 4 4  Knee extension (L3) 4* 4*  Knee flexion 4 4  Hip abduction    Hip extension    Hip external rotation    Hip internal rotation    Hip adduction    Ankle dorsiflexion (L4)    Ankle plantarflexion (S1)    Ankle inversion    Ankle eversion    Great Toe ext (L5)    Grossly     (Blank rows = not tested, score listed is out of 5 possible points.  N = WNL, D = diminished, C = clear for gross weakness with myotome testing, * = concordant pain with testing)  LE ROM:  ROM Right (Eval) Left (Eval)  Hip flexion    Hip extension    Hip abduction    Hip adduction    Hip internal rotation    Hip external rotation    Knee extension [facility]  Knee flexion [facility]  Ankle dorsiflexion    Ankle plantarflexion    Ankle inversion    Ankle eversion     (Blank rows = not tested, N = WNL, * = concordant pain with testing)  Functional Tests  Eval    30'' STS: 9x  UE used? N w/ pain    10 m max gait speed: 10'',  1.00 m/s, AD: n    Balance <10'' SLS on airex                                                  PATIENT SURVEYS:  FOTO 64 -> 64   TODAY'S TREATMENT:  [facility] Adult PT Treatment  05/22/2023:  Therapeutic Exercise:  Bike L2 5 min for warm up Heel raises with on edge of airex - 2x15 HEP review Quad set 5'' hold - 2x10 ea SLR - 2x10 ea Bridge from ball under knees - 2x10 - 5'' hold S/L clam - GTB Knee ext machine - 2x10 - 5#  Neuromuscular re-ed: Tandem on foam Blue rocker board     HOME EXERCISE  PROGRAM: Access Code: [email or web address] URL: [email or web address] Date: 05/15/2023 Prepared by: Alphonzo Severance  Exercises - Supine Quad Set  - 3 x daily - 4-7 x weekly - 2 sets - 10 reps -  10 second hold - Active Straight Leg Raise with Quad Set  - 1 x daily - 4-7 x weekly - 3 sets - 10 reps - Hooklying Clamshell with Resistance  - 1 x daily - 4-7 x weekly - 3 sets - 10 reps - Supine Bridge  - 1 x daily - 4-7 x weekly - 3 sets - 10 reps - 2-3 seconds hold  Treatment priorities   Eval        Quad strength        Higher level balance                                  ASSESSMENT:  CLINICAL IMPRESSION:  05/22/2023:  Katyana tolerated session well with no adverse reaction.  Concentrated on reviewing HEP and quad and hip strengthening.  Pt with significant fatigue but no increase in pain.  Will continue to build intensity and volume as tolerated.  Eval: Glena is a 63 y.o. female who presents to clinic with signs and sxs consistent with bil knee pain.  Consistent with referring provider impression of mod knee OA.  ROM is maintained with strength deficit. Armeta will benefit from skilled PT to address strength deficits and improve safety and comfort with daily tasks.  OBJECTIVE IMPAIRMENTS: Pain, balance, gait, LE and knee strength  ACTIVITY LIMITATIONS: pain with: transfers, standing, walking, bending, lifting, squatting  PERSONAL FACTORS: See medical history and pertinent history   REHAB POTENTIAL: Good  CLINICAL DECISION MAKING: Stable/uncomplicated  EVALUATION COMPLEXITY: Low   GOALS:   SHORT TERM GOALS: Target date: 06/12/2023   Memori will be >75% HEP compliant to improve carryover between sessions and facilitate independent management of condition  Evaluation: ongoing Goal status: INITIAL   LONG TERM GOALS: Target date: 07/10/2023   Christyanna will improve FOTO score to 70 as a proxy for functional improvement  Evaluation/Baseline: 64 Goal status: INITIAL     2.  Romesha will self report >/= 50% decrease in pain from evaluation to improve function in daily tasks  Evaluation/Baseline: 5/10 max pain Goal status: INITIAL   3.  Nyota will improve 10 meter max gait speed to 1 m/s (.1 m/s MCID) to show functional improvement in ambulation   Evaluation/Baseline: 1.37 m/s Goal status: INITIAL   Norms:     4.  Gursimran will improve 30'' STS (MCID 2) to >/= 12x (w/ UE?: N) to show improved LE strength and improved transfers   Evaluation/Baseline: 9x  w/ UE? N w/ pain Goal status: INITIAL   5.  Andresha will be able to stand for >20'' in SLS on airex stance, to show a significant improvement in balance in order to reduce fall risk   Evaluation/Baseline: <10'' bil Goal status: INITIAL   6.  Addine will improve the following MMTs to >/= 4+/5 to show improvement in strength:  bil knee flex and ext   Evaluation/Baseline: see chart in note Goal status: INITIAL   PLAN: PT FREQUENCY: 1-2x/week  PT DURATION: 8 weeks  PLANNED INTERVENTIONS:  97164- PT Re-evaluation, 97110-Therapeutic exercises, 97530- Therapeutic activity, [id number]- Neuromuscular re-education, 97535- Self Care, 44010- Manual therapy, [id number]- Gait training, [id number]- Aquatic Therapy, [phone]- Electrical stimulation (manual), [id number]- Vasopneumatic device, [id number]- Traction (mechanical), [id number]- Ionotophoresis 4mg /ml Dexamethasone, Taping, Dry Needling, Joint manipulation, and Spinal manipulation.   Alphonzo Severance PT, DPT 05/22/2023, 12:14 PM

## 2023-05-23 ENCOUNTER — Encounter: Payer: Self-pay | Admitting: Rheumatology

## 2023-05-23 ENCOUNTER — Ambulatory Visit: Payer: 59 | Attending: Rheumatology | Admitting: Rheumatology

## 2023-05-23 VITALS — BP 124/84 | HR 67 | Resp 15 | Ht 64.0 in | Wt 239.8 lb

## 2023-05-23 DIAGNOSIS — M79642 Pain in left hand: Secondary | ICD-10-CM

## 2023-05-23 DIAGNOSIS — M7062 Trochanteric bursitis, left hip: Secondary | ICD-10-CM | POA: Diagnosis not present

## 2023-05-23 DIAGNOSIS — M3219 Other organ or system involvement in systemic lupus erythematosus: Secondary | ICD-10-CM

## 2023-05-23 DIAGNOSIS — M797 Fibromyalgia: Secondary | ICD-10-CM

## 2023-05-23 DIAGNOSIS — G4709 Other insomnia: Secondary | ICD-10-CM

## 2023-05-23 DIAGNOSIS — R1319 Other dysphagia: Secondary | ICD-10-CM

## 2023-05-23 DIAGNOSIS — M79641 Pain in right hand: Secondary | ICD-10-CM

## 2023-05-23 DIAGNOSIS — E559 Vitamin D deficiency, unspecified: Secondary | ICD-10-CM

## 2023-05-23 DIAGNOSIS — Z79899 Other long term (current) drug therapy: Secondary | ICD-10-CM

## 2023-05-23 DIAGNOSIS — R091 Pleurisy: Secondary | ICD-10-CM | POA: Diagnosis not present

## 2023-05-23 DIAGNOSIS — Z820 Family history of epilepsy and other diseases of the nervous system: Secondary | ICD-10-CM

## 2023-05-23 DIAGNOSIS — I10 Essential (primary) hypertension: Secondary | ICD-10-CM

## 2023-05-23 DIAGNOSIS — D75A Glucose-6-phosphate dehydrogenase (G6PD) deficiency without anemia: Secondary | ICD-10-CM | POA: Diagnosis not present

## 2023-05-23 DIAGNOSIS — R7303 Prediabetes: Secondary | ICD-10-CM

## 2023-05-23 DIAGNOSIS — E785 Hyperlipidemia, unspecified: Secondary | ICD-10-CM

## 2023-05-23 DIAGNOSIS — R748 Abnormal levels of other serum enzymes: Secondary | ICD-10-CM

## 2023-05-23 DIAGNOSIS — G8929 Other chronic pain: Secondary | ICD-10-CM

## 2023-05-23 DIAGNOSIS — R5383 Other fatigue: Secondary | ICD-10-CM

## 2023-05-23 DIAGNOSIS — M79672 Pain in left foot: Secondary | ICD-10-CM

## 2023-05-23 DIAGNOSIS — M25562 Pain in left knee: Secondary | ICD-10-CM

## 2023-05-23 DIAGNOSIS — M79671 Pain in right foot: Secondary | ICD-10-CM

## 2023-05-23 DIAGNOSIS — M25561 Pain in right knee: Secondary | ICD-10-CM

## 2023-05-23 DIAGNOSIS — G5601 Carpal tunnel syndrome, right upper limb: Secondary | ICD-10-CM

## 2023-05-23 DIAGNOSIS — F39 Unspecified mood [affective] disorder: Secondary | ICD-10-CM

## 2023-05-23 MED ORDER — LIDOCAINE HCL 1 % IJ SOLN
1.5000 mL | INTRAMUSCULAR | Status: AC | PRN
  Administered 2023-05-23: 1.5 mL

## 2023-05-23 MED ORDER — TRIAMCINOLONE ACETONIDE 40 MG/ML IJ SUSP
40.0000 mg | INTRAMUSCULAR | Status: AC | PRN
  Administered 2023-05-23: 40 mg via INTRA_ARTICULAR

## 2023-05-23 NOTE — Patient Instructions (Signed)
Iliotibial Band Syndrome Rehab Ask your health care provider which exercises are safe for you. Do exercises exactly as told by your provider and adjust them as told. It's normal to feel mild stretching, pulling, tightness, or discomfort as you do these exercises. Stop right away if you feel sudden pain or your pain gets a lot worse. Do not begin these exercises until told by your provider. Stretching and range-of-motion exercises These exercises warm up your muscles and joints. They also improve the movement and flexibility of your hip and pelvis. Quadriceps stretch, prone  Lie face down (prone) on a firm surface like a bed or padded floor. Bend your left / right knee. Reach back to hold your ankle or pant leg. If you can't reach your ankle or pant leg, use a belt looped around your foot and grab the belt instead. Gently pull your heel toward your butt. Your knee should not slide out to the side. You should feel a stretch in the front of your thigh and knee, also called the quadriceps. Hold this position for __________ seconds. Repeat __________ times. Complete this exercise __________ times a day. Iliotibial band stretch The iliotibial band is a strip of tissue that runs along the outside of your hip down to your knee. Lie on your side with your left / right leg on top. Bend both knees and grab your left / right ankle. Stretch out your bottom arm to help you balance. Slowly bring your top knee back so your thigh goes behind your back. Slowly lower your top leg toward the floor until you feel a gentle stretch on the outside of your left / right hip and thigh. If you don't feel a stretch and your knee won't go farther, place the heel of your other foot on top of your knee and pull your knee down toward the floor with your foot. Hold this position for __________ seconds. Repeat __________ times. Complete this exercise __________ times a day. Strengthening exercises These exercises build strength  and endurance in your hip and pelvis. Endurance means your muscles can keep working even when they're tired. Straight leg raises, side-lying This exercise strengthens the muscles that rotate the leg at the hip and move it away from your body. These muscles are called hip abductors. Lie on your side with your left / right leg on top. Lie so your head, shoulder, hip, and knee line up. You can bend your bottom knee to help you balance. Roll your hips slightly forward so they're stacked directly over each other. Your left / right knee should face forward. Tense the muscles in your outer thigh and hip. Lift your top leg 4-6 inches (10-15 cm) off the ground. Hold this position for __________ seconds. Slowly lower your leg back down to the starting position. Let your muscles fully relax before doing this exercise again. Repeat __________ times. Complete this exercise __________ times a day. Leg raises, prone This exercise strengthens the muscles that move the hips backward. These muscles are called hip extensors. Lie face down (prone) on your bed or a firm surface. You can put a pillow under your hips for comfort and to support your lower back. Bend your left / right knee so your foot points straight up toward the ceiling. Keep the other leg straight and behind you. Squeeze your butt muscles. Lift your left / right thigh off the firm surface. Do not let your back arch. Tense your thigh muscle as hard as you can without having  more knee pain. Hold this position for __________ seconds. Slowly lower your leg to the starting position. Allow your leg to relax all the way. Repeat __________ times. Complete this exercise __________ times a day. Hip hike  Stand sideways on a bottom step. Place your feet so that your left / right leg is on the step, and the other foot is hanging off the side. If you need support for balance, hold onto a railing or wall. Keep your knees straight and your abdomen square,  meaning your hips are level. Then, lift your left / right hip up toward the ceiling. Slowly let your leg that's hanging off the step lower towards the floor. Your foot should get closer to the ground. Do not lean or bend your knees during this movement. Repeat __________ times. Complete this exercise __________ times a day. This information is not intended to replace advice given to you by your health care provider. Make sure you discuss any questions you have with your health care provider. Document Revised: 06/29/2022 Document Reviewed: 06/29/2022 Elsevier Patient Education  2024 [employer] Inc.   Vaccines You are taking a medication(s) that can suppress your immune system.  The following immunizations are recommended: Flu annually Covid-19  RSV Td/Tdap (tetanus, diphtheria, pertussis) every 10 years Pneumonia (Prevnar 15 then Pneumovax 23 at least 1 year apart.  Alternatively, can take Prevnar 20 without needing additional dose) Shingrix: 2 doses from 4 weeks to 6 months apart  Please check with your PCP to make sure you are up to date.

## 2023-05-24 ENCOUNTER — Other Ambulatory Visit: Payer: Self-pay

## 2023-05-24 LAB — CBC WITH DIFFERENTIAL/PLATELET
Absolute Lymphocytes: 1904 {cells}/uL (ref 850–3900)
Absolute Monocytes: 422 {cells}/uL (ref 200–950)
Basophils Absolute: 20 {cells}/uL (ref 0–200)
Basophils Relative: 0.3 %
Eosinophils Absolute: 48 {cells}/uL (ref 15–500)
Eosinophils Relative: 0.7 %
HCT: 40.4 % (ref 35.0–45.0)
Hemoglobin: 12.7 g/dL (ref 11.7–15.5)
MCH: 26.9 pg — ABNORMAL LOW (ref 27.0–33.0)
MCHC: 31.4 g/dL — ABNORMAL LOW (ref 32.0–36.0)
MCV: 85.6 fL (ref 80.0–100.0)
MPV: 9.9 fL (ref 7.5–12.5)
Monocytes Relative: 6.2 %
Neutro Abs: 4406 {cells}/uL (ref 1500–7800)
Neutrophils Relative %: 64.8 %
Platelets: 370 10*3/uL (ref 140–400)
RBC: 4.72 10*6/uL (ref 3.80–5.10)
RDW: 12.5 % (ref 11.0–15.0)
Total Lymphocyte: 28 %
WBC: 6.8 10*3/uL (ref 3.8–10.8)

## 2023-05-24 LAB — PROTEIN / CREATININE RATIO, URINE
Creatinine, Urine: 125 mg/dL (ref 20–275)
Protein/Creat Ratio: 40 mg/g{creat} (ref 24–184)
Protein/Creatinine Ratio: 0.04 mg/mg{creat} (ref 0.024–0.184)
Total Protein, Urine: 5 mg/dL (ref 5–24)

## 2023-05-24 LAB — COMPLETE METABOLIC PANEL WITH GFR
AG Ratio: 1.1 (calc) (ref 1.0–2.5)
ALT: 15 U/L (ref 6–29)
AST: 22 U/L (ref 10–35)
Albumin: 4.1 g/dL (ref 3.6–5.1)
Alkaline phosphatase (APISO): 53 U/L (ref 37–153)
BUN: 14 mg/dL (ref 7–25)
CO2: 23 mmol/L (ref 20–32)
Calcium: 9.7 mg/dL (ref 8.6–10.4)
Chloride: 103 mmol/L (ref 98–110)
Creat: 1 mg/dL (ref 0.50–1.05)
Globulin: 3.6 g/dL (ref 1.9–3.7)
Glucose, Bld: 72 mg/dL (ref 65–99)
Potassium: 3.6 mmol/L (ref 3.5–5.3)
Sodium: 139 mmol/L (ref 135–146)
Total Bilirubin: 0.4 mg/dL (ref 0.2–1.2)
Total Protein: 7.7 g/dL (ref 6.1–8.1)
eGFR: 64 mL/min/{1.73_m2} (ref >=60)

## 2023-05-24 LAB — C3 AND C4
C3 Complement: 136 mg/dL (ref 83–193)
C4 Complement: 21 mg/dL (ref 15–57)

## 2023-05-24 LAB — ANTI-DNA ANTIBODY, DOUBLE-STRANDED: ds DNA Ab: 2 [IU]/mL

## 2023-05-24 LAB — SEDIMENTATION RATE: Sed Rate: 19 mm/h (ref 0–30)

## 2023-05-24 MED ORDER — WEGOVY 0.5 MG/0.5ML ~~LOC~~ SOAJ: 0.5000 mg | SUBCUTANEOUS | 1 refills | Status: DC

## 2023-05-27 ENCOUNTER — Telehealth: Payer: Self-pay

## 2023-05-27 ENCOUNTER — Ambulatory Visit (INDEPENDENT_AMBULATORY_CARE_PROVIDER_SITE_OTHER): Payer: 59 | Admitting: Family Medicine

## 2023-05-27 NOTE — Telephone Encounter (Signed)
Pharmacy Patient Advocate Encounter   Received notification from CoverMyMeds that prior authorization for [facility] 0.5MG  is required/requested.   Insurance verification completed.   The patient is insured through [employer] .    PA required; PA submitted to above mentioned insurance via CoverMyMeds Key/confirmation #/EOC [id number] Status is pending

## 2023-05-29 ENCOUNTER — Ambulatory Visit: Payer: 59 | Admitting: Licensed Clinical Social Worker

## 2023-05-30 ENCOUNTER — Other Ambulatory Visit (HOSPITAL_COMMUNITY): Payer: Self-pay

## 2023-05-30 NOTE — Telephone Encounter (Signed)
Called [employer].   Request denied 05/28/23.   Weight loss meds are excluded on patients plan.   If medication is medically necessary, the following options are available for the provider:  - call for a peer to peer (within 5 days of denial) or,  - call/ send letter for an appeal.  [phone]

## 2023-05-30 NOTE — Telephone Encounter (Signed)
Camille--does this patient still have medicaid? We got this approved through Medicaid last time  [name], MD  [facility] Medicine Teaching Service

## 2023-06-02 ENCOUNTER — Other Ambulatory Visit (INDEPENDENT_AMBULATORY_CARE_PROVIDER_SITE_OTHER): Payer: Self-pay | Admitting: Family Medicine

## 2023-06-02 DIAGNOSIS — E559 Vitamin D deficiency, unspecified: Secondary | ICD-10-CM

## 2023-06-04 ENCOUNTER — Encounter: Payer: Self-pay | Admitting: Physical Therapy

## 2023-06-04 ENCOUNTER — Ambulatory Visit: Payer: 59 | Attending: Family Medicine | Admitting: Physical Therapy

## 2023-06-04 DIAGNOSIS — M6281 Muscle weakness (generalized): Secondary | ICD-10-CM | POA: Insufficient documentation

## 2023-06-04 DIAGNOSIS — M25562 Pain in left knee: Secondary | ICD-10-CM | POA: Diagnosis present

## 2023-06-04 DIAGNOSIS — R2681 Unsteadiness on feet: Secondary | ICD-10-CM | POA: Insufficient documentation

## 2023-06-04 DIAGNOSIS — M25561 Pain in right knee: Secondary | ICD-10-CM | POA: Diagnosis present

## 2023-06-04 NOTE — Therapy (Signed)
 OUTPATIENT PHYSICAL THERAPY LOWER EXTREMITY EVALUATION  Patient Name: AIKO BELKO MRN: [id number] DOB:06-03-1960, 63 y.o., female Today's Date: 06/04/2023   PT End of Session - 06/04/23 1133     Visit Number 3    Number of Visits --   1-2x/week   Date for PT Re-Evaluation 07/10/23    Authorization Type [employer]    Authorization Time Period auth required after 5th visit for [facility]    Authorization - Visit Number 3    Authorization - Number of Visits 5    PT Start Time 1131    PT Stop Time 1212    PT Time Calculation (min) 41 min             Past Medical History:  Diagnosis Date   Anxiety    Bilateral swelling of feet    Chest pain    Chewing difficulty    Constipation    Daily headache    Depression    Diverticulitis    Fibromyalgia    G6PD deficiency    outside labs--low level, no longer on HCQ   Glucose 6 phosphatase deficiency (HCC)    High cholesterol    History of cholelithiasis    History of colon polyps    Hypertension    Joint pain    Mini stroke    Osteoarthritis    Prediabetes    Rheumatoid arthritis (HCC)    SLE (systemic lupus erythematosus) (HCC) 1998   SOB (shortness of breath)    Swallowing difficulty    Past Surgical History:  Procedure Laterality Date   ABDOMINAL HYSTERECTOMY  09/2008   CESAREAN SECTION  1992, 1995   CHOLECYSTECTOMY N/A 07/18/2012   Procedure: LAPAROSCOPIC CHOLECYSTECTOMY WITH INTRAOPERATIVE CHOLANGIOGRAM;  Surgeon: Camellia CHRISTELLA Blush, MD;  Location: [facility] OR;  Service: General;  Laterality: N/A;   COLONOSCOPY     ERCP N/A 07/29/2012   Procedure: ENDOSCOPIC RETROGRADE CHOLANGIOPANCREATOGRAPHY (ERCP);  Surgeon: Belvie JONETTA Just, MD;  Location: [facility];  Service: Endoscopy;  Laterality: N/A;  Dr. Just said he would start this PT arond 1330( AW)   INCISION AND DRAINAGE ABSCESS Right 12/02/2015   Procedure: INCISION AND DRAINAGE ABSCESS;  Surgeon: Lynwood Pina, MD;  Location: [facility] OR;  Service: General;  Laterality: Right;   Right axillary abscess   TUBAL LIGATION  1995   Patient Active Problem List   Diagnosis Date Noted   Vitamin D  deficiency 03/14/2023   OSA (obstructive sleep apnea) 02/28/2023   Major depressive disorder, recurrent episode, moderate (HCC) 01/08/2023   Class 2 severe obesity due to excess calories with serious comorbidity and body mass index (BMI) of 39.0 to 39.9 in adult [facility]) 12/20/2022   Esophageal dysphagia 04/15/2020   Mood disorder (HCC) 11/13/2019   Atherosclerosis of aorta (HCC) 07/15/2019   Prediabetes 07/13/2019   Chronic abdominal pain 07/13/2019   Generalized anxiety disorder 05/15/2019   Hyperlipidemia 06/14/2015   Insomnia 12/15/2012   Essential hypertension, benign 04/10/2012   SLE (systemic lupus erythematosus) (HCC) 04/10/2012    PCP: Delores Suzann CHRISTELLA, MD  REFERRING PROVIDER: Delores Suzann CHRISTELLA, MD  THERAPY DIAG:  Pain in both knees, unspecified chronicity  Muscle weakness  Unsteadiness on feet  REFERRING DIAG: Primary osteoarthritis of both knees [M17.0]   Rationale for Evaluation and Treatment:  Rehabilitation  SUBJECTIVE:  PERTINENT PAST HISTORY:  Lupus, anxiety      PRECAUTIONS: None  WEIGHT BEARING RESTRICTIONS No  FALLS:  Has patient fallen in last 6 months? Yes, Number of falls: trip  rolled ankle and fell into car.  She feels like she trips often.  MOI/History of condition:  Onset date: >2 years  SUBJECTIVE STATEMENT  Pt reports that she is feeling better.  She still has difficulty with HEP because of work.   Red flags:  denies   Pain:  Are you having pain? 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  Assistive Device: na  Hand Dominance: na  Patient Goals/Specific Activities:  reduce pain   OBJECTIVE:   DIAGNOSTIC FINDINGS:  X-ray shows moderate OA bil knees  GENERAL OBSERVATION/GAIT: Slight antalgic gait  SENSATION: Light touch: Appears intact  MUSCLE LENGTH: Hamstrings: Right no restriction; Left no restriction Ely's test: Right subtle restriction; Left subtle restriction  LE MMT:  MMT Right (Eval) Left (Eval)  Hip flexion (L2, L3) 4 4  Knee extension (L3) 4* 4*  Knee flexion 4 4  Hip abduction    Hip extension    Hip external rotation    Hip internal rotation    Hip adduction    Ankle dorsiflexion (L4)    Ankle plantarflexion (S1)    Ankle inversion    Ankle eversion    Great Toe ext (L5)    Grossly     (Blank rows = not tested, score listed is out of 5 possible points.  N = WNL, D = diminished, C = clear for gross weakness with myotome testing, * = concordant pain with testing)  LE ROM:  ROM Right (Eval) Left (Eval)  Hip flexion    Hip extension    Hip abduction    Hip adduction    Hip internal rotation    Hip external rotation    Knee extension [facility]  Knee flexion [facility]  Ankle dorsiflexion    Ankle plantarflexion    Ankle inversion    Ankle eversion     (Blank rows = not tested, N = WNL, * = concordant pain with testing)  Functional Tests  Eval    30'' STS: 9x  UE used? N w/ pain    10 m max gait speed: 10'',  1.00 m/s, AD: n    Balance <10'' SLS on airex                                                  PATIENT SURVEYS:  FOTO 64 -> 64   TODAY'S TREATMENT:  [facility] Adult PT Treatment  06/04/2023:  Therapeutic Exercise:  nu-step L7 87m while taking subjective and planning session with patient Heel raises with on edge of airex - 2x20 SLR - 2x10 ea S/L hip abd - 2x10 Knee ext machine - 2x10 - 10# - some increase in L knee pain  Therapeutic Activity Bridge from ball under knees - 2x10 - 5'' hold Sit to stand from raised table - 2x10  Neuromuscular re-ed: Tandem on foam Blue rocker board      HOME EXERCISE PROGRAM: Access Code: [email or web address] URL: [email or web address] Date: 05/15/2023 Prepared by: Helene Gasmen  Exercises - Supine Quad Set  - 3 x daily - 4-7 x weekly - 2 sets - 10 reps - 10  second hold - Active Straight Leg Raise with Quad Set  - 1 x daily - 4-7 x weekly - 3 sets - 10 reps - Hooklying Clamshell with Resistance  - 1 x daily - 4-7 x weekly - 3 sets - 10 reps - Supine Bridge  - 1 x daily - 4-7 x weekly - 3 sets - 10 reps - 2-3 seconds hold  Treatment priorities   Eval        Quad strength        Higher level balance                                  ASSESSMENT:  CLINICAL IMPRESSION:  06/04/2023:  Magdelena tolerated session well with no adverse reaction.  Pt arrives to clinic with minimal pain complaint today.  She reports improvement in curb navigation.  We progressed exercises as tolerated with fatigue but no increase in pain.  Pt cued for form with s/l hip abd.  Minor increase in L knee pain with knee ext which is transient.  Pt reports minimal pain following treatment.  Continue per POC.  Eval: Skilynn is a 63 y.o. female who presents to clinic with signs and sxs consistent with bil knee pain.  Consistent with referring provider impression of mod knee OA.  ROM is maintained with strength deficit. Talley will benefit from skilled PT to address strength deficits and improve safety and comfort with daily tasks.  OBJECTIVE IMPAIRMENTS: Pain, balance, gait, LE and knee strength  ACTIVITY LIMITATIONS: pain with: transfers, standing, walking, bending, lifting, squatting  PERSONAL FACTORS: See medical history and pertinent history   REHAB POTENTIAL: Good  CLINICAL DECISION MAKING: Stable/uncomplicated  EVALUATION COMPLEXITY: Low   GOALS:   SHORT TERM GOALS: Target date: 06/12/2023   Tiasia will be >75% HEP compliant to improve carryover between sessions and facilitate independent management of condition  Evaluation: ongoing Goal  status: INITIAL   LONG TERM GOALS: Target date: 07/10/2023   Denelda will improve FOTO score to 70 as a proxy for functional improvement  Evaluation/Baseline: 64 Goal status: INITIAL    2.  Buffie will self report >/= 50% decrease in pain from evaluation to improve function in daily tasks  Evaluation/Baseline: 5/10 max pain Goal status: INITIAL   3.  Kayla will improve 10 meter max gait speed to 1 m/s (.1 m/s MCID) to show functional improvement in ambulation   Evaluation/Baseline: 1.37 m/s Goal status: INITIAL   Norms:     4.  Brittanee will improve 30'' STS (MCID 2) to >/= 12x (w/ UE?: N) to show improved LE strength and improved transfers   Evaluation/Baseline: 9x  w/ UE? N w/ pain Goal status: INITIAL   5.  Altheia will be able to stand for >20'' in SLS on airex stance, to show a significant improvement in balance in order to reduce fall risk   Evaluation/Baseline: <10'' bil Goal status: INITIAL   6.  Brandyn will improve the following MMTs to >/= 4+/5 to show improvement in strength:  bil knee flex and ext   Evaluation/Baseline: see chart in note Goal status: INITIAL   PLAN: PT FREQUENCY: 1-2x/week  PT DURATION: 8 weeks  PLANNED INTERVENTIONS:  [id number]- PT Re-evaluation, [id number]-Therapeutic exercises, 97530- Therapeutic activity, [id number]- Neuromuscular re-education, 97535- Self Care, [id number]- Manual therapy, [id number]- Gait training, [id number]- Aquatic Therapy, [phone]- Electrical stimulation (manual), [id number]- Vasopneumatic device, [id number]- Traction (mechanical), [id number]- Ionotophoresis 4mg /ml Dexamethasone , Taping, Dry  Needling, Joint manipulation, and Spinal manipulation.   Thijs Brunton PT, DPT 06/04/2023, 12:15 PM

## 2023-06-06 ENCOUNTER — Encounter (INDEPENDENT_AMBULATORY_CARE_PROVIDER_SITE_OTHER): Payer: Self-pay | Admitting: Family Medicine

## 2023-06-06 ENCOUNTER — Ambulatory Visit (INDEPENDENT_AMBULATORY_CARE_PROVIDER_SITE_OTHER): Payer: 59 | Admitting: Family Medicine

## 2023-06-06 VITALS — BP 120/59 | HR 67 | Temp 98.3°F | Ht 64.0 in | Wt 233.0 lb

## 2023-06-06 DIAGNOSIS — Z6841 Body Mass Index (BMI) 40.0 and over, adult: Secondary | ICD-10-CM

## 2023-06-06 DIAGNOSIS — E538 Deficiency of other specified B group vitamins: Secondary | ICD-10-CM | POA: Diagnosis not present

## 2023-06-06 DIAGNOSIS — R7303 Prediabetes: Secondary | ICD-10-CM

## 2023-06-06 DIAGNOSIS — E669 Obesity, unspecified: Secondary | ICD-10-CM

## 2023-06-06 DIAGNOSIS — Z6839 Body mass index (BMI) 39.0-39.9, adult: Secondary | ICD-10-CM

## 2023-06-06 DIAGNOSIS — E559 Vitamin D deficiency, unspecified: Secondary | ICD-10-CM

## 2023-06-06 MED ORDER — VITAMIN D (ERGOCALCIFEROL) 1.25 MG (50000 UNIT) PO CAPS: 50000.0000 [IU] | ORAL_CAPSULE | ORAL | 0 refills | Status: DC

## 2023-06-06 NOTE — Progress Notes (Signed)
 Veronica [name], D.O.  ABFM, ABOM Specializing in Clinical Bariatric Medicine  Office located at: [address] [place], [place]  [address]   Assessment and Plan:   FOR THE DISEASE OF OBESITY: BMI 40.0-44.9, adult (HCC) - current BMI 39.97 Obesity with starting BMI of 40.5 Assessment & Plan: Since last office visit on 05/08/2023 patient's muscle mass has increased by 1.6 lb. Fat mass has decreased by 7.2 lb. Total body water has decreased by 3.2 lb.  Counseling done on how various foods will affect these numbers and how to maximize success  Total lbs lost to date: 3 lbs  Total weight loss percentage to date: 1.27%    Recommended Dietary Goals Veronica [name] is currently in the action stage of change. As such, her goal is to continue weight management plan.  She has agreed to: continue current plan - CAT 1 MP with B/L options   Behavioral Intervention We discussed the following today: increasing protein intake during breakfast, lunch, and dinner, increasing lower glycemic fruits, better snacking choices, laughing cow cheese wedges, protein cereals, and healthier condiment options.    Additional resources provided today: Handout on CAT 1-2 breakfast options and Handout on CAT 1-2 lunch options  Evidence-based interventions for health behavior change were utilized today including the discussion of self monitoring techniques, problem-solving barriers and SMART goal setting techniques.   Regarding patient's less desirable eating habits and patterns, we employed the technique of small changes.   Pt will specifically work on: n/a   Recommended Physical Activity Goals Veronica [name] has been advised to work up to 150 minutes of moderate intensity aerobic activity a week and strengthening exercises 2-3 times per week for cardiovascular health, weight loss maintenance and preservation of muscle mass.   She has agreed to : Continue current level of physical activity    Pharmacotherapy We both  agreed to : continue Topamax  but discontinue Wegovy .   FOR ASSOCIATED CONDITIONS ADDRESSED TODAY:  Prediabetes Assessment & Plan: Pt is on Wegovy  0.25 mg weekly per her PCP. Pt is experiencing adverse SE from the Wegovy  - abdominal pain & headaches. Will discontinue Wegovy . Continue balanced diet focusing on protein, fruits, and vegetables while limiting simple carbohydrates. Losing 10% or more of body weight may improve condition. Will recheck Hemoglobin A1c.  Orders:  -     Hemoglobin A1c   B12 deficiency Assessment & Plan: Lab Results  Component Value Date   VITAMINB12 422 02/28/2023   B12 level is 422, not at goal of over 500. She is on a pre-natal vitamin which may have a small amount of B12. Will consider adding an OTC B12 supplement to her regimen if levels are not at goal after recheck today.   Orders:  -     Vitamin B12   Vitamin D  deficiency Assessment & Plan: Pt is on prescription high dose vitamin D  once weekly without any complications. Continue vitamin D  supplementation - recheck levels today.  Orders:  -     Vitamin D  (Ergocalciferol ); Take 1 capsule (50,000 Units total) by mouth every 7 (seven) days.  Dispense: 4 capsule; Refill: 0 -     VITAMIN D  25 Hydroxy (Vit-D Deficiency, Fractures)   Follow up:   Return 07/03/2023. She was informed of the importance of frequent follow up visits to maximize her success with intensive lifestyle modifications for her multiple health conditions.  Veronica [name] is aware that we will review all of her lab results at our next visit together in  person.  She is aware that if anything is critical/ life threatening with the results, we will be contacting her via MyChart or by my CMA will be calling them prior to the office visit to discuss acute management.     Subjective:   Chief complaint: Obesity Veronica [name] is here to discuss her progress with her obesity treatment plan. She is on the Category 1 Plan with Lunch options and states  she is following her eating plan approximately 50% of the time. She states she is walking 15 minutes 3 days per week.  Interval History:  Veronica [name] is here for a follow up office visit. Since last OV on 05/08/2023, Veronica [name] is down 5 lbs. She attributes her weight loss to not skipping meals. She reports feeling hungry about 2 hours after breakfast which consists of 2 eggs & 2 slices of turkey bacon. For lunch, she typically has a salad with lettuce, tomato, banana peppers, shredded cheese, cranberries, grapes, and a piece of chicken. After lunch, she snacks on apples, string cheese, or peanuts. For dinner, she does not feel hungry and does not get in the recommended 6 ounces of lean protein. Overall, upon food recall, she appears to not be eating enough food.  Pharmacotherapy for weight loss: She is currently taking  Wegovy  0.25 mg weekly and Topamax  50 mg daily .   Review of Systems:  Pertinent positives were addressed with patient today.  Reviewed by clinician on day of visit: allergies, medications, problem list, medical history, surgical history, family history, social history, and previous encounter notes.  Weight Summary and Biometrics   Weight Lost Since Last Visit: 5lb  Weight Gained Since Last Visit: 0  Vitals Temp: 98.3 F (36.8 C) BP: (!) 120/59 Pulse Rate: 67 SpO2: 96 %   Anthropometric Measurements Height: 5' 4 (1.626 m) Weight: 233 lb (105.7 kg) BMI (Calculated): 39.97 Weight at Last Visit: 238lb Weight Lost Since Last Visit: 5lb Weight Gained Since Last Visit: 0 Starting Weight: 236lb Total Weight Loss (lbs): 3 lb (1.361 kg) Peak Weight: 236lb   Body Composition  Body Fat %: 46.6 % Fat Mass (lbs): 108.6 lbs Muscle Mass (lbs): 118.2 lbs Total Body Water (lbs): 77.6 lbs Visceral Fat Rating : 15   Other Clinical Data Fasting: no Labs: no Today's Visit #: 5 Starting Date: 02/28/23    Objective:   PHYSICAL EXAM: Blood pressure (!) 120/59, pulse  67, temperature 98.3 F (36.8 C), height 5' 4 (1.626 m), weight 233 lb (105.7 kg), SpO2 96%. Body mass index is 39.99 kg/m.  General: she is overweight, cooperative and in no acute distress. PSYCH: Has normal mood, affect and thought process.   HEENT: EOMI, sclerae are anicteric. Lungs: Normal breathing effort, no conversational dyspnea. Extremities: Moves * 4 Neurologic: A and O * 3, good insight  DIAGNOSTIC DATA REVIEWED: BMET    Component Value Date/Time   NA 139 05/23/2023 1443   NA 140 04/12/2023 1134   K 3.6 05/23/2023 1443   CL 103 05/23/2023 1443   CO2 23 05/23/2023 1443   GLUCOSE 72 05/23/2023 1443   BUN 14 05/23/2023 1443   BUN 19 04/12/2023 1134   CREATININE 1.00 05/23/2023 1443   CALCIUM  9.7 05/23/2023 1443   GFRNONAA 64 04/15/2020 1121   GFRNONAA 87 06/13/2016 1436   GFRAA 74 04/15/2020 1121   GFRAA >89 06/13/2016 1436   Lab Results  Component Value Date   HGBA1C 6.2 (H) 02/28/2023   HGBA1C 6.2 (H) 02/11/2009  Lab Results  Component Value Date   INSULIN  27.6 (H) 02/28/2023   INSULIN  23.1 08/03/2022   Lab Results  Component Value Date   TSH 1.54 01/11/2023   CBC    Component Value Date/Time   WBC 6.8 05/23/2023 1443   RBC 4.72 05/23/2023 1443   HGB 12.7 05/23/2023 1443   HGB 12.5 08/03/2022 1210   HCT 40.4 05/23/2023 1443   HCT 40.0 08/03/2022 1210   PLT 370 05/23/2023 1443   PLT 350 08/03/2022 1210   MCV 85.6 05/23/2023 1443   MCV 84 08/03/2022 1210   MCH 26.9 (L) 05/23/2023 1443   MCHC 31.4 (L) 05/23/2023 1443   RDW 12.5 05/23/2023 1443   RDW 12.4 08/03/2022 1210   Iron Studies    Component Value Date/Time   IRON 114 05/18/2008 0405   TIBC 300 05/18/2008 0405   FERRITIN 19 05/18/2008 0405   IRONPCTSAT 38 05/18/2008 0405   Lipid Panel     Component Value Date/Time   CHOL 153 02/28/2023 1110   TRIG 60 02/28/2023 1110   HDL 54 02/28/2023 1110   CHOLHDL 3.1 04/09/2022 1106   CHOLHDL 4.4 06/13/2016 1436   VLDL 30 06/13/2016  1436   LDLCALC 87 02/28/2023 1110   Hepatic Function Panel     Component Value Date/Time   PROT 7.7 05/23/2023 1443   PROT 7.6 08/03/2022 1210   ALBUMIN 4.1 08/03/2022 1210   AST 22 05/23/2023 1443   ALT 15 05/23/2023 1443   ALKPHOS 61 08/03/2022 1210   BILITOT 0.4 05/23/2023 1443   BILITOT 0.3 08/03/2022 1210   BILIDIR 3.7 (H) 07/28/2012 1200   IBILI 1.7 (H) 07/28/2012 1200      Component Value Date/Time   TSH 1.54 01/11/2023 1057   Nutritional Lab Results  Component Value Date   VD25OH 34 01/11/2023    Attestations:   I, Special Puri, acting as a stage manager for [employer], DO., have compiled all relevant documentation for today's office visit on behalf of Veronica Jenkins, DO, while in the presence of [employer], DO.  Reviewed by clinician on day of visit: allergies, medications, problem list, medical history, surgical history, family history, social history, and previous encounter notes pertinent to patient's obesity diagnosis. I have spent 40 minutes in the care of the patient today including: preparing to see patient (e.g. review and interpretation of tests, old notes ), obtaining and/or reviewing separately obtained history, performing a medically appropriate examination or evaluation, counseling and educating the patient, ordering medications, test or procedures, documenting clinical information in the electronic or other health care record, and independently interpreting results and communicating results to the patient, family, or caregiver   I have reviewed the above documentation for accuracy and completeness, and I agree with the above. Veronica [name], D.O.  The 21st Century Cures Act was signed into law in 2016 which includes the topic of electronic health records.  This provides immediate access to information in MyChart.  This includes consultation notes, operative notes, office notes, lab results and pathology reports.  If you have any questions  about what you read please let us  know at your next visit so we can discuss your concerns and take corrective action if need be.  We are right here with you.

## 2023-06-06 NOTE — Telephone Encounter (Signed)
 Im honestly not sure with this one, I havent seen any appeals done in this case! You would have to send a letter stating why its medically necessary for patient to receive this medication (since its past 5 days since denial, for a peer to peer).

## 2023-06-07 LAB — HEMOGLOBIN A1C
Est. average glucose Bld gHb Est-mCnc: 120 mg/dL
Hgb A1c MFr Bld: 5.8 % — ABNORMAL HIGH (ref 4.8–5.6)

## 2023-06-07 LAB — VITAMIN D 25 HYDROXY (VIT D DEFICIENCY, FRACTURES): Vit D, 25-Hydroxy: 65.8 ng/mL (ref 30.0–100.0)

## 2023-06-07 LAB — VITAMIN B12: Vitamin B-12: 456 pg/mL (ref 232–1245)

## 2023-06-10 ENCOUNTER — Ambulatory Visit: Payer: 59 | Admitting: Licensed Clinical Social Worker

## 2023-06-11 ENCOUNTER — Ambulatory Visit: Payer: 59 | Admitting: Physical Therapy

## 2023-06-11 ENCOUNTER — Encounter: Payer: Self-pay | Admitting: Physical Therapy

## 2023-06-11 DIAGNOSIS — M25562 Pain in left knee: Secondary | ICD-10-CM

## 2023-06-11 DIAGNOSIS — M6281 Muscle weakness (generalized): Secondary | ICD-10-CM

## 2023-06-11 DIAGNOSIS — M25561 Pain in right knee: Secondary | ICD-10-CM

## 2023-06-11 DIAGNOSIS — R2681 Unsteadiness on feet: Secondary | ICD-10-CM

## 2023-06-11 NOTE — Therapy (Signed)
OUTPATIENT PHYSICAL THERAPY LOWER EXTREMITY EVALUATION  Patient Name: Veronica [name] MRN: [id number] DOB:1960/09/29, 63 y.o., female Today's Date: 06/11/2023   PT End of Session - 06/11/23 1053     Visit Number 4    Number of Visits --   1-2x/week   Date for PT Re-Evaluation 07/10/23    Authorization Type [employer]    Authorization Time Period auth required after 5th visit for oscar    Authorization - Number of Visits 5    PT Start Time 1052   pt arrived late   PT Stop Time 1130    PT Time Calculation (min) 38 min             Past Medical History:  Diagnosis Date   Anxiety    Bilateral swelling of feet    Chest pain    Chewing difficulty    Constipation    Daily headache    Depression    Diverticulitis    Fibromyalgia    G6PD deficiency    outside labs--low level, no longer on HCQ   Glucose 6 phosphatase deficiency (HCC)    High cholesterol    History of cholelithiasis    History of colon polyps    Hypertension    Joint pain    Mini stroke    Osteoarthritis    Prediabetes    Rheumatoid arthritis (HCC)    SLE (systemic lupus erythematosus) (HCC) 1998   SOB (shortness of breath)    Swallowing difficulty    Past Surgical History:  Procedure Laterality Date   ABDOMINAL HYSTERECTOMY  09/2008   CESAREAN SECTION  1992, 1995   CHOLECYSTECTOMY N/A 07/18/2012   Procedure: LAPAROSCOPIC CHOLECYSTECTOMY WITH INTRAOPERATIVE CHOLANGIOGRAM;  Surgeon: Atilano Ina, MD;  Location: [facility] OR;  Service: General;  Laterality: N/A;   COLONOSCOPY     ERCP N/A 07/29/2012   Procedure: ENDOSCOPIC RETROGRADE CHOLANGIOPANCREATOGRAPHY (ERCP);  Surgeon: Theda Belfast, MD;  Location: [facility];  Service: Endoscopy;  Laterality: N/A;  Dr. Elnoria Howard said he would start this PT arond 1330( AW)   INCISION AND DRAINAGE ABSCESS Right 12/02/2015   Procedure: INCISION AND DRAINAGE ABSCESS;  Surgeon: Jimmye Norman, MD;  Location: [facility] OR;  Service: General;  Laterality: Right;  Right axillary  abscess   TUBAL LIGATION  1995   Patient Active Problem List   Diagnosis Date Noted   Vitamin D deficiency 03/14/2023   OSA (obstructive sleep apnea) 02/28/2023   Major depressive disorder, recurrent episode, moderate (HCC) 01/08/2023   Class 2 severe obesity due to excess calories with serious comorbidity and body mass index (BMI) of 39.0 to 39.9 in adult [facility]) 12/20/2022   Esophageal dysphagia 04/15/2020   Mood disorder (HCC) 11/13/2019   Atherosclerosis of aorta (HCC) 07/15/2019   Prediabetes 07/13/2019   Chronic abdominal pain 07/13/2019   Generalized anxiety disorder 05/15/2019   Hyperlipidemia 06/14/2015   Insomnia 12/15/2012   Essential hypertension, benign 04/10/2012   SLE (systemic lupus erythematosus) (HCC) 04/10/2012    PCP: Westley Chandler, MD  REFERRING PROVIDER: Westley Chandler, MD  THERAPY DIAG:  Pain in both knees, unspecified chronicity  Muscle weakness  Unsteadiness on feet  Acute pain of right knee  REFERRING DIAG: Primary osteoarthritis of both knees [M17.0]   Rationale for Evaluation and Treatment:  Rehabilitation  SUBJECTIVE:  PERTINENT PAST HISTORY:  Lupus, anxiety      PRECAUTIONS: None  WEIGHT BEARING RESTRICTIONS No  FALLS:  Has patient fallen in last 6 months? Yes, Number of  falls: "trip" rolled ankle and fell into car.  She feels like she trips often.  MOI/History of condition:  Onset date: >2 years  SUBJECTIVE STATEMENT  Pt reports continued improvement.  She states that she is able to go up and down steps with less pain now.     Red flags:  denies   Pain:  Are you having pain? 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  Assistive Device: na  Hand Dominance:  na  Patient Goals/Specific Activities: reduce pain   OBJECTIVE:   DIAGNOSTIC FINDINGS:  X-ray shows moderate OA bil knees  GENERAL OBSERVATION/GAIT: Slight antalgic gait  SENSATION: Light touch: Appears intact  MUSCLE LENGTH: Hamstrings: Right no restriction; Left no restriction Ely's test: Right subtle restriction; Left subtle restriction  LE MMT:  MMT Right (Eval) Left (Eval)  Hip flexion (L2, L3) 4 4  Knee extension (L3) 4* 4*  Knee flexion 4 4  Hip abduction    Hip extension    Hip external rotation    Hip internal rotation    Hip adduction    Ankle dorsiflexion (L4)    Ankle plantarflexion (S1)    Ankle inversion    Ankle eversion    Great Toe ext (L5)    Grossly     (Blank rows = not tested, score listed is out of 5 possible points.  N = WNL, D = diminished, C = clear for gross weakness with myotome testing, * = concordant pain with testing)  LE ROM:  ROM Right (Eval) Left (Eval)  Hip flexion    Hip extension    Hip abduction    Hip adduction    Hip internal rotation    Hip external rotation    Knee extension [facility]  Knee flexion [facility] [place]  Ankle dorsiflexion    Ankle plantarflexion    Ankle inversion    Ankle eversion     (Blank rows = not tested, N = WNL, * = concordant pain with testing)  Functional Tests  Eval    30'' STS: 9x  UE used? N w/ pain    10 m max gait speed: 10'',  1.00 m/s, AD: n    Balance <10'' SLS on airex                                                  PATIENT SURVEYS:  FOTO 64 -> 64   TODAY'S TREATMENT:  [facility] Adult PT Treatment  06/11/2023:  Therapeutic Exercise:  Bike L3 - 5 min Heel raises with on edge of airex - 2x20 SLR - 3x10 Slant board stretch  - 45'' - 2x Seated knee ext with YTB - 3x10 ea Reviewing HEP  Therapeutic Activity Bridge from ball under knees - 2x10 - 5'' hold S/L heel raise - 2x10 ea Step up - 4'' - 2x10 - fwd Standing hip abd machine - 20# - 10x ea  Consider hip ext  machine next visit    HOME EXERCISE PROGRAM: Access Code: [email or web address] URL: [email or web address] Date: 06/11/2023 Prepared by: Alphonzo Severance  Exercises - Active Straight Leg Raise with Quad Set  -  1 x daily - 4-7 x weekly - 3 sets - 10 reps - Supine Bridge  - 1 x daily - 4-7 x weekly - 3 sets - 10 reps - 2-3 seconds hold - Seated Knee Extension with Resistance  - 1 x daily - 7 x weekly - 3 sets - 10 reps  Treatment priorities   Eval        Quad strength        Higher level balance                                  ASSESSMENT:  CLINICAL IMPRESSION:  06/11/2023:  Ragen tolerated session well with no adverse reaction.  Pt arrives to clinic with report of improvement in knee pain in function but continues to have more pain L vs R with higher intensity exercises.  Focused more on functional movements to day to good effect.  Pt reports fatigue but minimal increase in pain.  Cued for form to avoid lateral trunk lean with standing hip abd.  HEP updated.  Eval: Celine is a 63 y.o. female who presents to clinic with signs and sxs consistent with bil knee pain.  Consistent with referring provider impression of mod knee OA.  ROM is maintained with strength deficit. Dilana will benefit from skilled PT to address strength deficits and improve safety and comfort with daily tasks.  OBJECTIVE IMPAIRMENTS: Pain, balance, gait, LE and knee strength  ACTIVITY LIMITATIONS: pain with: transfers, standing, walking, bending, lifting, squatting  PERSONAL FACTORS: See medical history and pertinent history   REHAB POTENTIAL: Good  CLINICAL DECISION MAKING: Stable/uncomplicated  EVALUATION COMPLEXITY: Low   GOALS:   SHORT TERM GOALS: Target date: 06/12/2023   Dorina will be >75% HEP compliant to improve carryover between sessions and facilitate independent management of condition  Evaluation: ongoing Goal status: MET   LONG TERM GOALS: Target date: 07/10/2023   Millena will  improve FOTO score to 70 as a proxy for functional improvement  Evaluation/Baseline: 64 Goal status: INITIAL    2.  Simcha will self report >/= 50% decrease in pain from evaluation to improve function in daily tasks  Evaluation/Baseline: 5/10 max pain Goal status: INITIAL   3.  Annalyn will improve 10 meter max gait speed to 1 m/s (.1 m/s MCID) to show functional improvement in ambulation   Evaluation/Baseline: 1.37 m/s Goal status: INITIAL   Norms:     4.  Jessamine will improve 30'' STS (MCID 2) to >/= 12x (w/ UE?: N) to show improved LE strength and improved transfers   Evaluation/Baseline: 9x  w/ UE? N w/ pain Goal status: INITIAL   5.  Kaytee will be able to stand for >20'' in SLS on airex stance, to show a significant improvement in balance in order to reduce fall risk   Evaluation/Baseline: <10'' bil Goal status: INITIAL   6.  Roshell will improve the following MMTs to >/= 4+/5 to show improvement in strength:  bil knee flex and ext   Evaluation/Baseline: see chart in note Goal status: INITIAL   PLAN: PT FREQUENCY: 1-2x/week  PT DURATION: 8 weeks  PLANNED INTERVENTIONS:  97164- PT Re-evaluation, 97110-Therapeutic exercises, 97530- Therapeutic activity, [id number]- Neuromuscular re-education, 97535- Self Care, [id number]- Manual therapy, [id number]- Gait training, [id number]- Aquatic Therapy, [phone]- Electrical stimulation (manual), [id number]- Vasopneumatic device, [id number]- Traction (mechanical), [id number]- Ionotophoresis 4mg /ml Dexamethasone, Taping, Dry Needling, Joint manipulation, and Spinal manipulation.   Alphonzo Severance PT,  DPT 06/11/2023, 12:20 PM

## 2023-06-14 NOTE — Progress Notes (Signed)
 The patient attended a screening event on 03/16/23 where her screening results were wnl. At the event the patient documented that they had insurance and a pcp. Patient  declined the SDOH Questionnaire. Per chart review pt does have a pcp and the last ov with the pcp was 04/12/23. Chart review also indicates four future appts with different providers. No additional Health equity team support indicated at this time.

## 2023-06-19 ENCOUNTER — Ambulatory Visit: Payer: 59 | Admitting: Licensed Clinical Social Worker

## 2023-06-20 ENCOUNTER — Ambulatory Visit: Payer: 59 | Admitting: Physical Therapy

## 2023-06-21 ENCOUNTER — Telehealth: Payer: Self-pay | Admitting: Gastroenterology

## 2023-06-21 NOTE — Telephone Encounter (Signed)
 Patient called stated she is experiencing a lot of coughing not sure if it has to do with Genella Rife.

## 2023-06-21 NOTE — Telephone Encounter (Signed)
 Please advise patient to contact PCP for management of cough. Cont with antireflux measures and PPI. Small frequent meals with low fat diet

## 2023-06-21 NOTE — Telephone Encounter (Signed)
 Patient contacted. She agrees with this plan of care.

## 2023-06-21 NOTE — Telephone Encounter (Signed)
 Patient seen in December with similar complaint of coughing. Reports the cough has gotten "really bad" in the past 2 weeks. She is "coughing up food sometimes." No difficulty swallowing. Cough produces phlegm. She has not been on any steroids or NSAIDS. Taking pantoprazole 40 mg every day.

## 2023-06-22 ENCOUNTER — Ambulatory Visit (HOSPITAL_COMMUNITY)
Admission: EM | Admit: 2023-06-22 | Discharge: 2023-06-22 | Disposition: A | Discharge status: Home / Self Care | Payer: 59 | Attending: Physician Assistant | Admitting: Physician Assistant

## 2023-06-22 ENCOUNTER — Encounter (HOSPITAL_COMMUNITY): Payer: Self-pay | Admitting: Emergency Medicine

## 2023-06-22 DIAGNOSIS — J40 Bronchitis, not specified as acute or chronic: Secondary | ICD-10-CM | POA: Diagnosis not present

## 2023-06-22 MED ORDER — AZITHROMYCIN 250 MG PO TABS: ORAL_TABLET | ORAL | 0 refills | Status: DC

## 2023-06-22 MED ORDER — METHYLPREDNISOLONE 4 MG PO TBPK: ORAL_TABLET | ORAL | 0 refills | Status: DC

## 2023-06-22 NOTE — Discharge Instructions (Addendum)
 At this time I suspect you likely have mild bronchitis that is likely been caused by your recent viral symptoms.  I have sent in a Z-Pak.  This is an antibiotic that helps reduce pulmonary inflammation and provide antibacterial coverage.  I have also sent in a steroid called methylprednisolone.  Please follow the package directions for the Medrol Dosepak for administration. If any point you start to have worsening symptoms or you feel like you are not getting better even after finishing her medication regimen please follow-up with [place] or go to your primary care provider.  If you start to have increased shortness of breath, fevers are not responding to medication, chest tightness or chest pain, passing out please go to the emergency room for further evaluation management.  You can continue to take over-the-counter medications as needed for symptomatic management.  I would recommend taking Mucinex, Robitussin, Tylenol as needed for further management.

## 2023-06-22 NOTE — ED Triage Notes (Signed)
 Patient presents with c/o productive cough, nasal congestion and diarrhea x 1 week. Patient states she has been taking Theraflu for the symptoms.

## 2023-06-22 NOTE — ED Provider Notes (Signed)
 [facility]    CSN: [id number] Arrival date & time: 06/22/23  1002      History   Chief Complaint Chief Complaint  Patient presents with   Cough   Nasal Congestion   Diarrhea    HPI TAIWAN MILLON is a 63 y.o. female.   HPI   Patient presents today with concerns for productive cough, nasal congestion, diarrhea for the past week.  She also reports that she has had mild wheezing and has had chills.  She states that her husband has also had similar symptoms and was diagnosed with a sinus infection.  She reports that she has been taking Mucinex, cough medicine, Tylenol, Coricidin for her symptoms.  She also reports heartbeat sensation and fullness in her right ear.  Past Medical History:  Diagnosis Date   Anxiety    Bilateral swelling of feet    Chest pain    Chewing difficulty    Constipation    Daily headache    Depression    Diverticulitis    Fibromyalgia    G6PD deficiency    outside labs--low level, no longer on HCQ   Glucose 6 phosphatase deficiency (HCC)    High cholesterol    History of cholelithiasis    History of colon polyps    Hypertension    Joint pain    Mini stroke    Osteoarthritis    Prediabetes    Rheumatoid arthritis (HCC)    SLE (systemic lupus erythematosus) (HCC) 1998   SOB (shortness of breath)    Swallowing difficulty     Patient Active Problem List   Diagnosis Date Noted   Vitamin D deficiency 03/14/2023   OSA (obstructive sleep apnea) 02/28/2023   Major depressive disorder, recurrent episode, moderate (HCC) 01/08/2023   Class 2 severe obesity due to excess calories with serious comorbidity and body mass index (BMI) of 39.0 to 39.9 in adult [facility]) 12/20/2022   Esophageal dysphagia 04/15/2020   Mood disorder (HCC) 11/13/2019   Atherosclerosis of aorta (HCC) 07/15/2019   Prediabetes 07/13/2019   Chronic abdominal pain 07/13/2019   Generalized anxiety disorder 05/15/2019   Hyperlipidemia 06/14/2015   Insomnia 12/15/2012    Essential hypertension, benign 04/10/2012   SLE (systemic lupus erythematosus) (HCC) 04/10/2012    Past Surgical History:  Procedure Laterality Date   ABDOMINAL HYSTERECTOMY  09/2008   CESAREAN SECTION  1992, 1995   CHOLECYSTECTOMY N/A 07/18/2012   Procedure: LAPAROSCOPIC CHOLECYSTECTOMY WITH INTRAOPERATIVE CHOLANGIOGRAM;  Surgeon: Atilano Ina, MD;  Location: [facility] OR;  Service: General;  Laterality: N/A;   COLONOSCOPY     ERCP N/A 07/29/2012   Procedure: ENDOSCOPIC RETROGRADE CHOLANGIOPANCREATOGRAPHY (ERCP);  Surgeon: Theda Belfast, MD;  Location: [facility];  Service: Endoscopy;  Laterality: N/A;  Dr. Elnoria Howard said he would start this PT arond 1330( AW)   INCISION AND DRAINAGE ABSCESS Right 12/02/2015   Procedure: INCISION AND DRAINAGE ABSCESS;  Surgeon: Jimmye Norman, MD;  Location: [facility] OR;  Service: General;  Laterality: Right;  Right axillary abscess   TUBAL LIGATION  1995    OB History     Gravida  2   Para  2   Term  2   Preterm  0   AB  0   Living  2      SAB  0   IAB  0   Ectopic  0   Multiple  0   Live Births  2  Home Medications    Prior to Admission medications   Medication Sig Start Date End Date Taking? Authorizing Provider  azithromycin (ZITHROMAX) 250 MG tablet Take 500mg  PO daily x1d and then 250mg  daily x4 days 06/22/23  Yes Jerlisa Diliberto E, PA-C  methylPREDNISolone (MEDROL DOSEPAK) 4 MG TBPK tablet Please follow administration directions on package. 06/22/23  Yes Quinette Hentges, Oswaldo Conroy, PA-C  aspirin EC 81 MG tablet Take 1 tablet (81 mg total) by mouth daily. Swallow whole. 12/08/19   Autry-Lott, Randa Evens, DO  atorvastatin (LIPITOR) 10 MG tablet Take 1 tablet (10 mg total) by mouth daily. 08/03/22   Westley Chandler, MD  cetirizine (ZYRTEC) 10 MG tablet Take 1 tablet (10 mg total) by mouth daily. 08/03/22   Westley Chandler, MD  FLUoxetine (PROZAC) 20 MG capsule Take 1 capsule (20 mg total) by mouth daily. 08/03/22   Westley Chandler, MD   hydroxychloroquine (PLAQUENIL) 200 MG tablet Take 1 tablet by mouth twice daily 05/06/23   Pollyann Savoy, MD  indapamide (LOZOL) 2.5 MG tablet Take 1 tablet (2.5 mg total) by mouth daily. 08/03/22   Westley Chandler, MD  losartan (COZAAR) 100 MG tablet Take 1 tablet (100 mg total) by mouth daily. 08/03/22   Westley Chandler, MD  pantoprazole (PROTONIX) 40 MG tablet Take 1 tablet (40 mg total) by mouth daily. 12/21/22   Esterwood, Amy S, PA-C  polyethylene glycol powder (GLYCOLAX/MIRALAX) 17 GM/SCOOP powder Take 17 g by mouth 2 (two) times daily as needed. 08/03/22   Westley Chandler, MD  Prenatal Vit-Fe Fumarate-FA (PRENATAL VITAMINS PO) Take by mouth.    [provider]  spironolactone (ALDACTONE) 25 MG tablet Take 1 tablet (25 mg total) by mouth daily. 08/03/22   Westley Chandler, MD  topiramate (TOPAMAX) 50 MG tablet Take 1 tablet (50 mg total) by mouth at bedtime. 01/15/23   Westley Chandler, MD  Vitamin D, Ergocalciferol, (DRISDOL) 1.25 MG (50000 UNIT) CAPS capsule Take 1 capsule (50,000 Units total) by mouth every 7 (seven) days. 06/06/23   Thomasene Lot, DO    Family History Family History  Problem Relation Age of Onset   Hypertension Mother    Diabetes Mother    Bladder Cancer Mother    Sleep apnea Mother    Hypertension Father    Kidney disease Father    Diabetes Sister    Multiple sclerosis Sister    Diabetes Brother    Hypertension Brother    Healthy Son    Healthy Son     Social History Social History   Tobacco Use   Smoking status: Never    Passive exposure: Current   Smokeless tobacco: Never  Vaping Use   Vaping status: Never Used  Substance Use Topics   Alcohol use: No   Drug use: No     Allergies   Amlodipine and Tessalon [benzonatate]   Review of Systems Review of Systems  Constitutional:  Positive for chills and fatigue. Negative for fever.  HENT:  Positive for congestion and rhinorrhea.   Respiratory:  Positive for cough and wheezing. Negative for  chest tightness and shortness of breath.   Gastrointestinal:  Positive for diarrhea. Negative for nausea and vomiting.     Physical Exam Triage Vital Signs ED Triage Vitals  Encounter Vitals Group     BP 06/22/23 1013 (!) 140/82     Systolic BP Percentile --      Diastolic BP Percentile --      Pulse Rate  06/22/23 1013 68     Resp 06/22/23 1013 18     Temp 06/22/23 1013 98.3 F (36.8 C)     Temp src --      SpO2 06/22/23 1013 95 %     Weight --      Height --      Head Circumference --      Peak Flow --      Pain Score 06/22/23 1015 0     Pain Loc --      Pain Education --      Exclude from Growth Chart --    No data found.  Updated Vital Signs BP (!) 140/82 (BP Location: Left Arm)   Pulse 68   Temp 98.3 F (36.8 C)   Resp 18   SpO2 95%   Visual Acuity Right Eye Distance:   Left Eye Distance:   Bilateral Distance:    Right Eye Near:   Left Eye Near:    Bilateral Near:     Physical Exam Vitals reviewed.  Constitutional:      General: She is awake.     Appearance: Normal appearance. She is well-developed and well-groomed.  HENT:     Head: Normocephalic and atraumatic.     Right Ear: Hearing, tympanic membrane and ear canal normal.     Left Ear: Hearing, tympanic membrane and ear canal normal.     Mouth/Throat:     Lips: Pink.     Mouth: Mucous membranes are moist.     Pharynx: Uvula midline. No pharyngeal swelling, oropharyngeal exudate, posterior oropharyngeal erythema, uvula swelling or postnasal drip.  Cardiovascular:     Rate and Rhythm: Normal rate and regular rhythm.     Heart sounds: Normal heart sounds.  Pulmonary:     Effort: Pulmonary effort is normal.     Breath sounds: Decreased air movement present. Wheezing present. No decreased breath sounds, rhonchi or rales.  Musculoskeletal:     Cervical back: Normal range of motion.  Lymphadenopathy:     Head:     Right side of head: No submental, submandibular or preauricular adenopathy.     Left  side of head: No submental, submandibular or preauricular adenopathy.     Cervical:     Right cervical: No superficial cervical adenopathy.    Left cervical: No superficial cervical adenopathy.  Neurological:     Mental Status: She is alert.  Psychiatric:        Behavior: Behavior is cooperative.      UC Treatments / Results  Labs (all labs ordered are listed, but only abnormal results are displayed) Labs Reviewed - No data to display  EKG   Radiology No results found.  Procedures Procedures (including critical care time)  Medications Ordered in UC Medications - No data to display  Initial Impression / Assessment and Plan / UC Course  I have reviewed the triage vital signs and the nursing notes.  Pertinent labs & imaging results that were available during my care of the patient were reviewed by me and considered in my medical decision making (see chart for details).      Final Clinical Impressions(s) / UC Diagnoses   Final diagnoses:  Bronchitis   Acute, new concern Patient presents today with 1 week of productive coughing, nasal congestion, diarrhea.  She reports that she has had recent sick contacts in her home.  She reports that her symptoms are not improving with home medication management.  Physical exam is notable for  mild global wheezing and oxygen saturation at 95%.  Will start Medrol steroid to assist with wheezing and pulmonary inflammation.  Will also provide Z-Pak for pulmonary inflammation and antibiotic coverage.  ED and return precautions reviewed and provided in after visit summary.  Follow-up as needed for progressing or persistent symptoms    Discharge Instructions      At this time I suspect you likely have mild bronchitis that is likely been caused by your recent viral symptoms.  I have sent in a Z-Pak.  This is an antibiotic that helps reduce pulmonary inflammation and provide antibacterial coverage.  I have also sent in a steroid called  methylprednisolone.  Please follow the package directions for the Medrol Dosepak for administration. If any point you start to have worsening symptoms or you feel like you are not getting better even after finishing her medication regimen please follow-up with [place] or go to your primary care provider.  If you start to have increased shortness of breath, fevers are not responding to medication, chest tightness or chest pain, passing out please go to the emergency room for further evaluation management.  You can continue to take over-the-counter medications as needed for symptomatic management.  I would recommend taking Mucinex, Robitussin, Tylenol as needed for further management.     ED Prescriptions     Medication Sig Dispense Auth. Provider   azithromycin (ZITHROMAX) 250 MG tablet Take 500mg  PO daily x1d and then 250mg  daily x4 days 6 each Kanishk Stroebel E, PA-C   methylPREDNISolone (MEDROL DOSEPAK) 4 MG TBPK tablet Please follow administration directions on package. 21 each Chrisangel Eskenazi E, PA-C      PDMP not reviewed this encounter.   Providence Crosby, PA-C 06/22/23 1044

## 2023-06-25 ENCOUNTER — Encounter: Payer: Self-pay | Admitting: Physical Therapy

## 2023-06-25 ENCOUNTER — Ambulatory Visit: Payer: 59 | Admitting: Physical Therapy

## 2023-06-25 DIAGNOSIS — M25561 Pain in right knee: Secondary | ICD-10-CM

## 2023-06-25 DIAGNOSIS — R2681 Unsteadiness on feet: Secondary | ICD-10-CM

## 2023-06-25 DIAGNOSIS — M6281 Muscle weakness (generalized): Secondary | ICD-10-CM

## 2023-06-25 NOTE — Therapy (Signed)
 OUTPATIENT PHYSICAL THERAPY LOWER EXTREMITY EVALUATION  Patient Name: Veronica [name] MRN: [id number] DOB:[birth year], 63 y.o., female Today's Date: 06/25/2023   PT End of Session - 06/25/23 0835     Visit Number 5    Number of Visits --   1-2x/week   Date for PT Re-Evaluation 07/10/23    Authorization Type [employer]    Authorization Time Period auth required after 5th visit for [employer]    Authorization - Visit Number 5    Authorization - Number of Visits 5    PT Start Time [phone]    PT Stop Time 0914    PT Time Calculation (min) 40 min             Past Medical History:  Diagnosis Date   Anxiety    Bilateral swelling of feet    Chest pain    Chewing difficulty    Constipation    Daily headache    Depression    Diverticulitis    Fibromyalgia    G6PD deficiency    outside labs--low level, no longer on HCQ   Glucose 6 phosphatase deficiency (HCC)    High cholesterol    History of cholelithiasis    History of colon polyps    Hypertension    Joint pain    Mini stroke    Osteoarthritis    Prediabetes    Rheumatoid arthritis (HCC)    SLE (systemic lupus erythematosus) (HCC) 1998   SOB (shortness of breath)    Swallowing difficulty    Past Surgical History:  Procedure Laterality Date   ABDOMINAL HYSTERECTOMY  09/2008   CESAREAN SECTION  1992, 1995   CHOLECYSTECTOMY N/A 07/18/2012   Procedure: LAPAROSCOPIC CHOLECYSTECTOMY WITH INTRAOPERATIVE CHOLANGIOGRAM;  Surgeon: Atilano Ina, MD;  Location: [facility] OR;  Service: General;  Laterality: N/A;   COLONOSCOPY     ERCP N/A 07/29/2012   Procedure: ENDOSCOPIC RETROGRADE CHOLANGIOPANCREATOGRAPHY (ERCP);  Surgeon: Theda Belfast, MD;  Location: [facility];  Service: Endoscopy;  Laterality: N/A;  Dr. Elnoria Howard said he would start this PT arond 1330( AW)   INCISION AND DRAINAGE ABSCESS Right 12/02/2015   Procedure: INCISION AND DRAINAGE ABSCESS;  Surgeon: Jimmye Norman, MD;  Location: [facility] OR;  Service: General;  Laterality:  Right;  Right axillary abscess   TUBAL LIGATION  1995   Patient Active Problem List   Diagnosis Date Noted   Vitamin D deficiency 03/14/2023   OSA (obstructive sleep apnea) 02/28/2023   Major depressive disorder, recurrent episode, moderate (HCC) 01/08/2023   Class 2 severe obesity due to excess calories with serious comorbidity and body mass index (BMI) of 39.0 to 39.9 in adult [facility]) 12/20/2022   Esophageal dysphagia 04/15/2020   Mood disorder (HCC) 11/13/2019   Atherosclerosis of aorta (HCC) 07/15/2019   Prediabetes 07/13/2019   Chronic abdominal pain 07/13/2019   Generalized anxiety disorder 05/15/2019   Hyperlipidemia 06/14/2015   Insomnia 12/15/2012   Essential hypertension, benign 04/10/2012   SLE (systemic lupus erythematosus) (HCC) 04/10/2012    PCP: Westley Chandler, MD  REFERRING PROVIDER: Westley Chandler, MD  THERAPY DIAG:  Pain in both knees, unspecified chronicity  Muscle weakness  Unsteadiness on feet  Acute pain of right knee  REFERRING DIAG: Primary osteoarthritis of both knees [M17.0]   Rationale for Evaluation and Treatment:  Rehabilitation  SUBJECTIVE:  PERTINENT PAST HISTORY:  Lupus, anxiety      PRECAUTIONS: None  WEIGHT BEARING RESTRICTIONS No  FALLS:  Has patient fallen in last 6  months? Yes, Number of falls: "trip" rolled ankle and fell into car.  She feels like she trips often.  MOI/History of condition:  Onset date: >2 years  SUBJECTIVE STATEMENT  Pt reports continued improvement.  Pt reports that she continues to make good progress.  She is able to walk on flat ground with minimal pain.  She still has some difficulty with steps.   Red flags:  denies   Pain:  Are you having pain? 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  Assistive Device: na  Hand Dominance: na  Patient Goals/Specific Activities: reduce pain   OBJECTIVE:   DIAGNOSTIC FINDINGS:  X-ray shows moderate OA bil knees  GENERAL OBSERVATION/GAIT: Slight antalgic gait  SENSATION: Light touch: Appears intact  MUSCLE LENGTH: Hamstrings: Right no restriction; Left no restriction Ely's test: Right subtle restriction; Left subtle restriction  LE MMT:  MMT Right (Eval) Left (Eval)  Hip flexion (L2, L3) 4 4  Knee extension (L3) 4* 4*  Knee flexion 4 4  Hip abduction    Hip extension    Hip external rotation    Hip internal rotation    Hip adduction    Ankle dorsiflexion (L4)    Ankle plantarflexion (S1)    Ankle inversion    Ankle eversion    Great Toe ext (L5)    Grossly     (Blank rows = not tested, score listed is out of 5 possible points.  N = WNL, D = diminished, C = clear for gross weakness with myotome testing, * = concordant pain with testing)  LE ROM:  ROM Right (Eval) Left (Eval)  Hip flexion    Hip extension    Hip abduction    Hip adduction    Hip internal rotation    Hip external rotation    Knee extension [facility]  Knee flexion [facility]  Ankle dorsiflexion    Ankle plantarflexion    Ankle inversion    Ankle eversion     (Blank rows = not tested, N = WNL, * = concordant pain with testing)  Functional Tests  Eval    30'' STS: 9x  UE used? N w/ pain    10 m max gait speed: 10'',  1.00 m/s, AD: n    Balance <10'' SLS on airex                                                  PATIENT SURVEYS:  FOTO 64 -> 64   TODAY'S TREATMENT:  [facility] Adult PT Treatment  06/25/2023:  Therapeutic Exercise:  Bike L3 - 5 min Slant board stretch  - 1' - 2x Knee ext machine - 10# x20 - 15# x20 Reviewing HEP  Therapeutic Activity Bridge from ball under knees - 2x10 - 5'' hold Sit to stand - 3x10 S/L heel raise - 2x to fatigue Squat with  UE support - 2x10 Step up - 6'' - 2x10 - fwd Standing hip abd machine - 20# - 10x ea (not today)    HOME EXERCISE PROGRAM: Access Code: [email or web address] URL: [email or web address] Date: 06/11/2023 Prepared by: Veronica [name]  Exercises - Active Straight Leg Raise with Quad Set  - 1 x daily - 4-7 x weekly - 3 sets - 10 reps - Supine Bridge  - 1 x daily - 4-7 x weekly - 3 sets - 10 reps - 2-3 seconds hold - Seated Knee Extension with Resistance  - 1 x daily - 7 x weekly - 3 sets - 10 reps  Treatment priorities   Eval        Quad strength        Higher level balance                                  ASSESSMENT:  CLINICAL IMPRESSION:  06/25/2023:  Veronica [name] tolerated session well with no adverse reaction.  We continued to work on volume and intensity of CC exercises which emphasize quad strength.  Veronica [name] is having minimal pain while navigating flat surfaces at this point but is still challenged with steps and squatting L>R.  Encouraged more activity at home and at work when possible.  Eval: Veronica [name] is a 63 y.o. female who presents to clinic with signs and sxs consistent with bil knee pain.  Consistent with referring provider impression of mod knee OA.  ROM is maintained with strength deficit. Veronica [name] will benefit from skilled PT to address strength deficits and improve safety and comfort with daily tasks.  OBJECTIVE IMPAIRMENTS: Pain, balance, gait, LE and knee strength  ACTIVITY LIMITATIONS: pain with: transfers, standing, walking, bending, lifting, squatting  PERSONAL FACTORS: See medical history and pertinent history   REHAB POTENTIAL: Good  CLINICAL DECISION MAKING: Stable/uncomplicated  EVALUATION COMPLEXITY: Low   GOALS:   SHORT TERM GOALS: Target date: 06/12/2023   Veronica [name] will be >75% HEP compliant to improve carryover between sessions and facilitate independent management of condition  Evaluation: ongoing Goal status: MET   LONG TERM GOALS: Target date:  07/10/2023   Veronica [name] will improve FOTO score to 70 as a proxy for functional improvement  Evaluation/Baseline: 64 Goal status: INITIAL    2.  Veronica [name] will self report >/= 50% decrease in pain from evaluation to improve function in daily tasks  Evaluation/Baseline: 5/10 max pain Goal status: INITIAL   3.  Aariyana will improve 10 meter max gait speed to 1 m/s (.1 m/s MCID) to show functional improvement in ambulation   Evaluation/Baseline: 1.37 m/s Goal status: INITIAL   Norms:     4.  Veronica [name] will improve 30'' STS (MCID 2) to >/= 12x (w/ UE?: N) to show improved LE strength and improved transfers   Evaluation/Baseline: 9x  w/ UE? N w/ pain Goal status: INITIAL   5.  Veronica [name] will be able to stand for >20'' in SLS on airex stance, to show a significant improvement in balance in order to reduce fall risk   Evaluation/Baseline: <10'' bil Goal status: INITIAL   6.  Veronica [name] will improve the following MMTs to >/= 4+/5 to show improvement in strength:  bil knee flex and ext   Evaluation/Baseline: see chart in note Goal status: INITIAL   PLAN: PT FREQUENCY: 1-2x/week  PT DURATION: 8 weeks  PLANNED INTERVENTIONS:  [id number]- PT Re-evaluation, 97110-Therapeutic exercises, 97530- Therapeutic activity, [id number]- Neuromuscular re-education, 97535- Self Care, [id number]- Manual therapy, [id number]- Gait training, [id number]- Aquatic Therapy, [phone]- Electrical stimulation (manual), [id number]- Vasopneumatic device, [id number]- Traction (mechanical), [id number]- Ionotophoresis 4mg /ml Dexamethasone, Taping, Dry Needling, Joint manipulation, and Spinal manipulation.   Veronica [name] PT, DPT 06/25/2023, 9:28  AM

## 2023-07-03 ENCOUNTER — Ambulatory Visit: Payer: 59 | Attending: Family Medicine | Admitting: Physical Therapy

## 2023-07-03 ENCOUNTER — Ambulatory Visit (INDEPENDENT_AMBULATORY_CARE_PROVIDER_SITE_OTHER): Payer: 59 | Admitting: Family Medicine

## 2023-07-03 ENCOUNTER — Encounter (INDEPENDENT_AMBULATORY_CARE_PROVIDER_SITE_OTHER): Payer: Self-pay | Admitting: Family Medicine

## 2023-07-03 VITALS — BP 117/73 | HR 69 | Temp 97.4°F | Ht 64.0 in | Wt 234.0 lb

## 2023-07-03 DIAGNOSIS — E538 Deficiency of other specified B group vitamins: Secondary | ICD-10-CM

## 2023-07-03 DIAGNOSIS — E669 Obesity, unspecified: Secondary | ICD-10-CM

## 2023-07-03 DIAGNOSIS — R7303 Prediabetes: Secondary | ICD-10-CM | POA: Diagnosis not present

## 2023-07-03 DIAGNOSIS — E559 Vitamin D deficiency, unspecified: Secondary | ICD-10-CM | POA: Diagnosis not present

## 2023-07-03 DIAGNOSIS — Z6841 Body Mass Index (BMI) 40.0 and over, adult: Secondary | ICD-10-CM

## 2023-07-03 MED ORDER — VITAMIN D (ERGOCALCIFEROL) 1.25 MG (50000 UNIT) PO CAPS: 50000.0000 [IU] | ORAL_CAPSULE | ORAL | 0 refills | Status: DC

## 2023-07-03 NOTE — Progress Notes (Signed)
 Veronica [name], D.O.  ABFM, ABOM Specializing in Clinical Bariatric Medicine  Office located at: [address] [place], [place]  [address]   Assessment and Plan:   FOR THE DISEASE OF OBESITY:  BMI 40.0-44.9, adult (HCC) - current BMI 39.97 Obesity with starting BMI of 40.5 Assessment & Plan: Since last office visit on 06/06/2023 patient's  Muscle mass has increased by 0.6 lb. Fat mass has increased by 0.4 lb. Total body water has decreased by 0.2 lb.  Counseling done on how various foods will affect these numbers and how to maximize success  Total lbs lost to date: 2 lbs  Total weight loss percentage to date: 0.85%    Recommended Dietary Goals [name] is currently in the action stage of change. As such, her goal is to continue weight management plan.  She has agreed to: continue current plan   Behavioral Intervention We discussed the following today: the importance of the dinner meal,  increasing lean protein intake to established goals at dinner,  increasing vegetables, increasing lower glycemic fruits, and better snacking choices  Additional resources provided today: handout on CAT 1 meal plan  and handout on protein content of various foods.   Evidence-based interventions for health behavior change were utilized today including the discussion of self monitoring techniques, problem-solving barriers and SMART goal setting techniques.   Regarding patient's less desirable eating habits and patterns, we employed the technique of small changes.   Pt will specifically work on: n/a   Recommended Physical Activity Goals [name] has been advised to work up to 150 minutes of moderate intensity aerobic activity a week and strengthening exercises 2-3 times per week for cardiovascular health, weight loss maintenance and preservation of muscle mass.   She has agreed to : Continue current level of physical activity    Pharmacotherapy We both agreed to : continue with nutritional  and behavioral strategies and continue medication regimen - may consider increasing Topiramate in the future if needed.    FOR ASSOCIATED CONDITIONS ADDRESSED TODAY:  Prediabetes Assessment & Plan: Lab Results  Component Value Date   HGBA1C 5.8 (H) 06/06/2023   HGBA1C 6.2 (H) 02/28/2023   HGBA1C 5.7 08/03/2022   No current meds. Diet/exercise approach. Her Hemoglobin A1c improved from 6.2 to 5.8. She attributes this to cutting back on simple sugars. She denies hunger/cravings for carbohydrates. Continue working on nutrition plan to decrease simple carbohydrates, increase lean proteins and exercise to promote weight loss and improve glycemic control. Ongoing weight loss can improve condition.    Vitamin D deficiency Assessment & Plan: Lab Results  Component Value Date   VD25OH 65.8 06/06/2023   VD25OH 34 01/11/2023   Pt is currently on ERGO 50,000 units once weekly without adverse SE. Her vitamin D levels are at goal. Continue supplement. Will recheck levels in 3 months.   Relevant Orders:  -     Vitamin D (Ergocalciferol); Take 1 capsule (50,000 Units total) by mouth every 7 (seven) days.  Dispense: 5 capsule; Refill: 0   B12 deficiency Assessment & Plan: Lab Results  Component Value Date   VITAMINB12 456 06/06/2023   B12 level is 456, not quite at goal of over 500. She is on a daily pre-natal vitamin which may have some B12. Shared decision: Pt will continue to focus on B12 rich foods and continue her pre-natal vitamin. Recheck levels in 3 months.    Follow up:   Return 07/17/2023 with Ines Bloomer Rayburn, PA. She was informed of  the importance of frequent follow up visits to maximize her success with intensive lifestyle modifications for her multiple health conditions.  Subjective:   Chief complaint: Obesity Shervon is here to discuss her progress with her obesity treatment plan. She is on the Category 1 Plan  with B/L options and states she is following her eating plan  approximately 50% of the time. She states she is walking 30 minutes 3 days per week.   Interval History:  Veronica [name] is here for a follow up office visit. Since last OV on 06/06/2023, Demetrice is up 1 lb. Was in ED on 06/22/2023 and was found to have mild bronchitis. She completed a 6 day treatment of Z-Pak and Medrol Dosepak. She will f/up with her PCP for further care in the very near future. Food wise, pt was mostly getting in her meal plan foods prior to being sick. While she was sick, she "lost my appetite" and was mostly eating liquid foods. Her appetite is slowly returning. She is currently getting in all the CAT 1 breakfast and lunch foods. However, for dinner she has been typically having a cheese and apple.   Pharmacotherapy for weight loss: She is currently taking  Topiramate 50 mg daily  .   Review of Systems:  Pertinent positives were addressed with patient today.  Reviewed by clinician on day of visit: allergies, medications, problem list, medical history, surgical history, family history, social history, and previous encounter notes.  Weight Summary and Biometrics   Weight Lost Since Last Visit: 0  Weight Gained Since Last Visit: 1 lb    Vitals Temp: (!) 97.4 F (36.3 C) BP: 117/73 Pulse Rate: 69 SpO2: 99 %   Anthropometric Measurements Height: 5\' 4"  (1.626 m) Weight: 234 lb (106.1 kg) BMI (Calculated): 40.15 Weight at Last Visit: 233 lb Weight Lost Since Last Visit: 0 Weight Gained Since Last Visit: 1 lb Starting Weight: 236 lb Total Weight Loss (lbs): 2 lb (0.907 kg) Peak Weight: 236 lb   Body Composition  Body Fat %: 46.5 % Fat Mass (lbs): 109 lbs Muscle Mass (lbs): 118.8 lbs Total Body Water (lbs): 77.4 lbs Visceral Fat Rating : 15   Other Clinical Data Fasting: No Labs: No Today's Visit #: 6 Starting Date: 02/28/23    Objective:   PHYSICAL EXAM: Blood pressure 117/73, pulse 69, temperature (!) 97.4 F (36.3 C), height 5\' 4"  (1.626 m),  weight 234 lb (106.1 kg), SpO2 99%. Body mass index is 40.17 kg/m.  General: she is overweight, cooperative and in no acute distress. PSYCH: Has normal mood, affect and thought process.   HEENT: EOMI, sclerae are anicteric. Lungs: Normal breathing effort, no conversational dyspnea. Extremities: Moves * 4 Neurologic: A and O * 3, good insight  DIAGNOSTIC DATA REVIEWED: BMET    Component Value Date/Time   NA 139 05/23/2023 1443   NA 140 04/12/2023 1134   K 3.6 05/23/2023 1443   CL 103 05/23/2023 1443   CO2 23 05/23/2023 1443   GLUCOSE 72 05/23/2023 1443   BUN 14 05/23/2023 1443   BUN 19 04/12/2023 1134   CREATININE 1.00 05/23/2023 1443   CALCIUM 9.7 05/23/2023 1443   GFRNONAA 64 04/15/2020 1121   GFRNONAA 87 06/13/2016 1436   GFRAA 74 04/15/2020 1121   GFRAA >89 06/13/2016 1436   Lab Results  Component Value Date   HGBA1C 5.8 (H) 06/06/2023   HGBA1C 6.2 (H) 02/11/2009   Lab Results  Component Value Date   INSULIN 27.6 (H)  02/28/2023   INSULIN 23.1 08/03/2022   Lab Results  Component Value Date   TSH 1.54 01/11/2023   CBC    Component Value Date/Time   WBC 6.8 05/23/2023 1443   RBC 4.72 05/23/2023 1443   HGB 12.7 05/23/2023 1443   HGB 12.5 08/03/2022 1210   HCT 40.4 05/23/2023 1443   HCT 40.0 08/03/2022 1210   PLT 370 05/23/2023 1443   PLT 350 08/03/2022 1210   MCV 85.6 05/23/2023 1443   MCV 84 08/03/2022 1210   MCH 26.9 (L) 05/23/2023 1443   MCHC 31.4 (L) 05/23/2023 1443   RDW 12.5 05/23/2023 1443   RDW 12.4 08/03/2022 1210   Iron Studies    Component Value Date/Time   IRON 114 05/18/2008 0405   TIBC 300 05/18/2008 0405   FERRITIN 19 05/18/2008 0405   IRONPCTSAT 38 05/18/2008 0405   Lipid Panel     Component Value Date/Time   CHOL 153 02/28/2023 1110   TRIG 60 02/28/2023 1110   HDL 54 02/28/2023 1110   CHOLHDL 3.1 04/09/2022 1106   CHOLHDL 4.4 06/13/2016 1436   VLDL 30 06/13/2016 1436   LDLCALC 87 02/28/2023 1110   Hepatic Function Panel      Component Value Date/Time   PROT 7.7 05/23/2023 1443   PROT 7.6 08/03/2022 1210   ALBUMIN 4.1 08/03/2022 1210   AST 22 05/23/2023 1443   ALT 15 05/23/2023 1443   ALKPHOS 61 08/03/2022 1210   BILITOT 0.4 05/23/2023 1443   BILITOT 0.3 08/03/2022 1210   BILIDIR 3.7 (H) 07/28/2012 1200   IBILI 1.7 (H) 07/28/2012 1200      Component Value Date/Time   TSH 1.54 01/11/2023 1057   Nutritional Lab Results  Component Value Date   VD25OH 65.8 06/06/2023   VD25OH 34 01/11/2023    Attestations:   I, Special Puri, acting as a Stage manager for Thomasene Lot, DO., have compiled all relevant documentation for today's office visit on behalf of Thomasene Lot, DO, while in the presence of [employer], DO.  Reviewed by clinician on day of visit: allergies, medications, problem list, medical history, surgical history, family history, social history, and previous encounter notes pertinent to patient's obesity diagnosis. I have spent 41 minutes in the care of the patient today including: preparing to see patient (e.g. review and interpretation of tests, old notes ), obtaining and/or reviewing separately obtained history, performing a medically appropriate examination or evaluation, counseling and educating the patient, ordering medications, test or procedures, documenting clinical information in the electronic or other health care record, and independently interpreting results and communicating results to the patient, family, or caregiver   I have reviewed the above documentation for accuracy and completeness, and I agree with the above. Veronica [name], D.O.  The 21st Century Cures Act was signed into law in 2016 which includes the topic of electronic health records.  This provides immediate access to information in MyChart.  This includes consultation notes, operative notes, office notes, lab results and pathology reports.  If you have any questions about what you read please let us know  at your next visit so we can discuss your concerns and take corrective action if need be.  We are right here with you.

## 2023-07-03 NOTE — Progress Notes (Unsigned)
 SUBJECTIVE:   CHIEF COMPLAINT: cough, weight  HPI:   Veronica [name] is a 63 y.o.  with history notable for SLE, obesity, and  prediabetes presenting for cough.  Was seen at urgent care in February 22 (about 11 days ago) for cough congestion.  Her symptoms actually started the week prior so she has been sick a total of 3 weeks.  She reports ongoing cough right ear fullness and pressure in her ear.  She reports mild headache some neck pain and just feeling overall fatigued.  She feels she is sleeping more easily.  See below details related to that.  She reports she completed her antibiotics and steroid course.  This made her feel mildly better.  She is using Mucinex over-the-counter for her symptoms.  She is not using anything else.  She feels her symptoms are waxing and waning.  She denies green discharge, fevers, chills, nausea vomiting chest pain or dyspnea.  She denies a history of asthma.  She does report her husband smokes in the house she has been married to him for many years.  She denies smoke exposure otherwise.  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 No history of PRN albuterol   The patient has a history of mild sleep apnea.  She was diagnosed in 2019 and opted to pursue weight loss.  She reports increased fatigue and sleepiness.  She does continue to snore.  Weight has been relatively stable.  Patient reports asymmetry to her shoulders.  She is right-hand dominant.  She is concerned because her bra strap falls of the left side.  No trauma.  She has had chronic right shoulder problems.  PERTINENT  PMH / PSH/Family/Social History : Reviewed as above.  OBJECTIVE:   BP 139/78   Pulse 66   Ht 5\' 4"  (1.626 m)   Wt 240 lb 12.8 oz (109.2 kg)   SpO2 100%   BMI 41.33 kg/m   Today's weight:  Last Weight  Most recent update: 07/04/2023  9:08 AM    Weight  109.2 kg (240 lb 12.8 oz)            Review of prior weights: Filed Weights   07/04/23 0906  Weight: 240 lb 12.8 oz (109.2  kg)    HEENT: EOMI. Sclera without injection or icterus. MMM. External auditory canal examined and WNL. L TM normal appearance, no erythema or bulging. R  TM with scarring, no light reflex, no redness  Neck: Supple.  Slight asymmetry to right shoulder as compared to left with increased muscle tone and bulk on right as compared to left.  Good range of motion in all directions in both shoulders. Cardiac: Regular rate and rhythm. Normal S1/S2. No murmurs, rubs, or gallops appreciated. Lungs: Slightly diminished but clear bilaterally to ascultation.  Psych: Pleasant and appropriate    ASSESSMENT/PLAN:   Assessment & Plan Strain of left trapezius muscle, initial encounter Suspect this may be because of her asymmetry of her shoulders.  She is also been coughing a lot and has some tenderness and tightness along her trapezius.  Will prescribe muscle relaxer.  She would not use with alcohol. Subacute cough Suspect she may have under lying chronic lung disease from smoke exposure.  Also considered asthma.  Will repeat her chest x-ray although she likely needs CT imaging.  Given the duration of the symptoms were referred to pulmonary medicine - CXR - Supportive measures  - CBC to evaluate  for possible infection  - Doubled  her Topamax dose for headaches- CMP today    [name], MD  Family Medicine Teaching Service  [facility]

## 2023-07-04 ENCOUNTER — Ambulatory Visit: Admitting: Family Medicine

## 2023-07-04 ENCOUNTER — Ambulatory Visit (INDEPENDENT_AMBULATORY_CARE_PROVIDER_SITE_OTHER): Payer: PRIVATE HEALTH INSURANCE | Admitting: Licensed Clinical Social Worker

## 2023-07-04 ENCOUNTER — Encounter: Payer: Self-pay | Admitting: Family Medicine

## 2023-07-04 VITALS — BP 139/78 | HR 66 | Ht 64.0 in | Wt 240.8 lb

## 2023-07-04 DIAGNOSIS — J411 Mucopurulent chronic bronchitis: Secondary | ICD-10-CM

## 2023-07-04 DIAGNOSIS — F411 Generalized anxiety disorder: Secondary | ICD-10-CM

## 2023-07-04 DIAGNOSIS — F331 Major depressive disorder, recurrent, moderate: Secondary | ICD-10-CM | POA: Diagnosis not present

## 2023-07-04 DIAGNOSIS — S46812A Strain of other muscles, fascia and tendons at shoulder and upper arm level, left arm, initial encounter: Secondary | ICD-10-CM

## 2023-07-04 DIAGNOSIS — J4489 Other specified chronic obstructive pulmonary disease: Secondary | ICD-10-CM

## 2023-07-04 DIAGNOSIS — R052 Subacute cough: Secondary | ICD-10-CM

## 2023-07-04 MED ORDER — MOMETASONE FUROATE 50 MCG/ACT NA SUSP: 2.0000 | Freq: Every day | NASAL | 12 refills | Status: DC

## 2023-07-04 MED ORDER — CYCLOBENZAPRINE HCL 5 MG PO TABS: 5.0000 mg | ORAL_TABLET | Freq: Three times a day (TID) | ORAL | 1 refills | Status: DC | PRN

## 2023-07-04 NOTE — Progress Notes (Signed)
 [name] Counselor/Therapist Progress Note  Patient ID: Veronica [name], MRN: [id number]    Date: 07/04/23  Time Spent: 101  pm - 200 pm : 59 Minutes  Treatment Type: Individual Therapy.  Reported Symptoms: Symptoms of anxiety and depression related to work stress and marriage   Mental Status Exam: Appearance:  Casual     Behavior: Appropriate  Motor: Normal  Speech/Language:  Clear and Coherent  Affect: Appropriate  Mood: normal  Thought process: normal  Thought content:   WNL  Sensory/Perceptual disturbances:   WNL  Orientation: oriented to person, place, time/date, situation, day of week, month of year, and year  Attention: Good  Concentration: Good  Memory: WNL  Fund of knowledge:  Good  Insight:   Good  Judgment:  Good  Impulse Control: Good    Risk Assessment: Danger to Self:  No Self-injurious Behavior: No Danger to Others: No Duty to Warn:no Physical Aggression / Violence:No  Access to Firearms a concern: No  Gang Involvement:No    Subjective:    Veronica [name] participated from office, located at [employer] with Clinician present. Veronica [name] consented to treatment.    Veronica [name] presented for her session reporting that not much has changed since out last interaction. Patient reports her husband quit his job again and has started a new job but doesn't like it either. She states that he called her reporting that she needs to find him another job. Patient states that she has reminded him that he cannot keep quitting jobs and starting over. She reports that he may give her 500 dollars for the house and within a few days he has taken that and more back by telling her he needs money. Patient states she cannot have a civil conversation with him and she doesn't feel it will ever change. Patient reports being tired and exhausted of being the one to always be the provider and make sure all the bills and things are taken care of.  Clinician provided support  and validation to patients feelings via active listening and verbal interaction. Clinician encouraged patient to take time for self-care and to recognize that she is deserving of that. Clinician processed with patient that setting boundaries are important with her spouse in this area. Clinician processed with patient that clearing stating what she needs and identifying how she feels. We discussed that he needs to understand that she feels disrespected by him due to him not doing things to help and recognizing how hard she works.  Patient states that she doesn't know how to get his attention because anything she says he doesn't take her serious. She states that she has even ask him to leave but he knows that he  doesn't have anywhere to go or money to get a place. Patient states he stays in the bed and drinks and smokes weed. She states that she also knows that he gambles excessively as well. Patient reports that she may attempt to talk with him at night when they are both in a good space. Patient agrees to continue to work on Pharmacologist and to continue with bi weekly therapy. Treatment plan will be reviewed by 01/07/2025.  Goals for patient include: Emotional Wellbeing: Identify and express emotions openly. Practice self-compassion and positive self-talk.  Learn healthy coping mechanisms for stress and difficult emotions.  Continue with biweekly therapy to address underlying issues.  Physical Health: Establish a regular sleep schedule.  Engage in regular physical activity, even if starting with short  walks.  Maintain a balanced diet with nutritious foods.  Limit alcohol and substance use.  Social Connection: Reconnect with supportive friends and family members.  Consider joining a support group for people going through divorce.  Gradually expand social activities as comfort level allows.  Personal Growth: Explore new hobbies and interests.  Set small, achievable goals for personal development.   Learn stress management techniques like meditation or deep breathing.  Develop a positive outlook on the future.  Important considerations: Be patient with yourself: Healing takes time, and there will be ups and downs.  Seek professional help: Therapy can be crucial for managing depression and anxiety after a divorce.  Prioritize self-care: Make time for activities that nourish your mind and body.  Set realistic goals: Break down larger goals into smaller, manageable steps.  Communicate your needs: Let loved ones know how they can support you.    Interventions: Cognitive Behavioral Therapy and Assertiveness/Communication  Diagnosis: Generalized Anxiety Disorder/Major Depressive Disorder, recurrent moderate   Phyllis Ginger MSW, LCSW/DATE 07/04/2023

## 2023-07-04 NOTE — Patient Instructions (Addendum)
 It was wonderful to see you today.  Please bring ALL of your medications with you to every visit.   Today we talked about:  You can use honey with tea three times a day for cough  Cough can last 8 weeks after infection   I do not think you need another antibiotic at this time  I ordered labs--I will message you  An x-ray was ordered for you---you do not need an appointment to have this completed.  I recommend going to [facility] [address] [place]  If the results are normal,I will send you a letter  I will call you with results if anything is abnormal     I recommend you undergo a mammogram.   Mammogram at [facility] Address: [address] [place], [place], [place] [address] Hours:  Open ? Closes 5?PM Phone: [phone]  Please let me know if you have questions. I will send you a letter or call you with results.    I have referred you to Pulmonary medicine  to further evaluate your concern. If you do not received a phone call about this appointment within 3-4 weeks, please call our office back at [phone]. Clemencia Course coordinates our referrals and can assist you in this.   For your ear pain I recommend a nasal spray twice a day  To use, place the tip in the nostril  Aim toward your ear Then dispense the medication by pressing down    Please follow up in 1 months   Thank you for choosing [facility].   Please call [phone] with any questions about today's appointment.  Please be sure to schedule follow up at the front  desk before you leave today.   [name], MD  Family Medicine

## 2023-07-05 ENCOUNTER — Telehealth: Payer: Self-pay

## 2023-07-05 ENCOUNTER — Other Ambulatory Visit (HOSPITAL_COMMUNITY): Payer: Self-pay

## 2023-07-05 ENCOUNTER — Encounter: Payer: Self-pay | Admitting: Family Medicine

## 2023-07-05 LAB — COMPREHENSIVE METABOLIC PANEL
ALT: 15 IU/L (ref 0–32)
AST: 17 IU/L (ref 0–40)
Albumin: 4.1 g/dL (ref 3.8–4.9)
Alkaline Phosphatase: 63 IU/L (ref 44–121)
BUN/Creatinine Ratio: 15 (ref 12–28)
BUN: 15 mg/dL (ref 8–27)
Bilirubin Total: 0.5 mg/dL (ref 0.0–1.2)
CO2: 24 mmol/L (ref 20–29)
Calcium: 9.6 mg/dL (ref 8.7–10.3)
Chloride: 102 mmol/L (ref 96–106)
Creatinine, Ser: 1 mg/dL (ref 0.57–1.00)
Globulin, Total: 3.4 g/dL (ref 1.5–4.5)
Glucose: 98 mg/dL (ref 70–99)
Potassium: 3.9 mmol/L (ref 3.5–5.2)
Sodium: 140 mmol/L (ref 134–144)
Total Protein: 7.5 g/dL (ref 6.0–8.5)
eGFR: 64 mL/min/{1.73_m2} (ref >59)

## 2023-07-05 LAB — CBC
Hematocrit: 40.1 % (ref 34.0–46.6)
Hemoglobin: 12.8 g/dL (ref 11.1–15.9)
MCH: 27.5 pg (ref 26.6–33.0)
MCHC: 31.9 g/dL (ref 31.5–35.7)
MCV: 86 fL (ref 79–97)
Platelets: 371 10*3/uL (ref 150–450)
RBC: 4.66 x10E6/uL (ref 3.77–5.28)
RDW: 13.1 % (ref 11.7–15.4)
WBC: 6.5 10*3/uL (ref 3.4–10.8)

## 2023-07-05 NOTE — Telephone Encounter (Signed)
 H69.8 Eustachian tube dysfunction

## 2023-07-05 NOTE — Telephone Encounter (Signed)
 Pharmacy Patient Advocate Encounter   Received notification from CoverMyMeds that prior authorization for Mometasone Furoate 50MCG/ACT suspension is required/requested.   Insurance verification completed.   The patient is insured through [facility] .   PA required; PA submitted to above mentioned insurance via CoverMyMeds Key/confirmation #/EOC [id number]. Status is pending

## 2023-07-05 NOTE — Telephone Encounter (Signed)
 Rec'd PA request for patients generic Nasonex, Mometasone Furoate 50MCG/ACT suspension.   What's the diagnosis and code?

## 2023-07-08 NOTE — Telephone Encounter (Signed)
 Pharmacy Patient Advocate Encounter  Received notification from [facility] that Prior Authorization for MOMETASONE FUROATE SPRAY has been DENIED.  Full denial letter will be uploaded to the media tab. See denial reason below.  You do not meet the plan's medical necessity criteria for non-formulary drugs.  This medication is non-formulary which means it is not a preferred medication.  To be considered for approval, you must show a trial and failure or inability to use all the preferred medications that are covered which include flunisolide nasal solution, fluticasone nasal spray, Omnaris spray (may be requested through a prior authorization), and triamcinolone acetonide nasal aerosol suspension.  PA #/Case ID/Reference #: [id number]

## 2023-07-09 MED ORDER — FLUNISOLIDE 25 MCG/ACT (0.025%) NA SOLN: 2.0000 | Freq: Two times a day (BID) | NASAL | 1 refills | Status: DC

## 2023-07-09 NOTE — Telephone Encounter (Signed)
 Alternative sent

## 2023-07-09 NOTE — Addendum Note (Signed)
 Addended by: Manson Passey, Demitrious Mccannon on: 07/09/2023 06:48 AM   Modules accepted: Orders

## 2023-07-11 ENCOUNTER — Other Ambulatory Visit: Payer: Self-pay | Admitting: *Deleted

## 2023-07-11 DIAGNOSIS — I10 Essential (primary) hypertension: Secondary | ICD-10-CM

## 2023-07-11 MED ORDER — INDAPAMIDE 2.5 MG PO TABS: 2.5000 mg | ORAL_TABLET | Freq: Every day | ORAL | 2 refills | Status: DC

## 2023-07-12 ENCOUNTER — Ambulatory Visit
Admission: RE | Admit: 2023-07-12 | Discharge: 2023-07-12 | Disposition: A | Discharge status: Home / Self Care | Source: Ambulatory Visit | Attending: Family Medicine | Admitting: Family Medicine

## 2023-07-12 ENCOUNTER — Encounter: Payer: Self-pay | Admitting: Family Medicine

## 2023-07-12 DIAGNOSIS — R052 Subacute cough: Secondary | ICD-10-CM

## 2023-07-17 ENCOUNTER — Encounter (INDEPENDENT_AMBULATORY_CARE_PROVIDER_SITE_OTHER): Payer: Self-pay | Admitting: Physician Assistant

## 2023-07-17 ENCOUNTER — Ambulatory Visit (INDEPENDENT_AMBULATORY_CARE_PROVIDER_SITE_OTHER): Admitting: Physician Assistant

## 2023-07-17 VITALS — BP 119/78 | HR 67 | Temp 97.4°F | Ht 64.0 in | Wt 234.0 lb

## 2023-07-17 DIAGNOSIS — R7303 Prediabetes: Secondary | ICD-10-CM

## 2023-07-17 DIAGNOSIS — E785 Hyperlipidemia, unspecified: Secondary | ICD-10-CM | POA: Diagnosis not present

## 2023-07-17 DIAGNOSIS — I10 Essential (primary) hypertension: Secondary | ICD-10-CM | POA: Diagnosis not present

## 2023-07-17 DIAGNOSIS — E559 Vitamin D deficiency, unspecified: Secondary | ICD-10-CM

## 2023-07-17 DIAGNOSIS — Z6841 Body Mass Index (BMI) 40.0 and over, adult: Secondary | ICD-10-CM

## 2023-07-17 DIAGNOSIS — E669 Obesity, unspecified: Secondary | ICD-10-CM

## 2023-07-17 NOTE — Progress Notes (Signed)
 SUBJECTIVE: Discussed the use of AI scribe software for clinical note transcription with the patient, who gave verbal consent to proceed.  Chief Complaint: Obesity  Interim History: She has maintained her weight since last visit.   Veronica [name] is here to discuss her progress with her obesity treatment plan. She is on the Category 1 Plan and states she is following her eating plan approximately 50 % of the time. She states she is exercising walking 30 minutes 3 times per week.  Veronica [name] is a 63 year old female who presents for follow-up of her obesity treatment plan.  She has maintained her weight since last visit. Since starting the treatment plan in October, she is down 2 lbs.   She is attempting to reduce her intake of sweets and sweet tea, which she identifies as dietary challenges. She is in the prediabetic range with a history of an A1c of 6.2 at the start of her treatment, which has improved to 5.8.   Her current diet includes eggs and [place] bacon for breakfast, and chicken and salad for dinner. She avoids bread but does eat honey wheat bread for sandwiches, and we discussed considering switching to low-carb wraps. She uses Sweet'N Low as a sweetener and drinks mostly water and Crystal Light. She snacks on apples and yogurt when hungry after meals.  She has a history of hyperlipidemia, hypertension, SLE and vitamin D deficiency. She is currently taking vitamin D supplements due to low levels and has been advised to increase her intake of fish to boost her B12 levels. However, she finds it difficult to find certain types of fish like trout, and although she likes tuna and salmon, she has not consumed them recently.  She works as a live-in caregiver on a seven-day on, seven-day off schedule. She finds it challenging to adhere to her dietary plan while at work due to boredom, which leads to increased hunger.  She has a strong family history of type 2 diabetes, including her mother,  grandmother, aunt, and sister. OBJECTIVE: Visit Diagnoses: Problem List Items Addressed This Visit     Prediabetes - Primary   Vitamin D deficiency   Essential hypertension, benign (Chronic)   Other Visit Diagnoses       Obesity with starting BMI of 40.5         Obesity She is maintaining her weight but struggles with cravings for sweets and sweet tea.  She has been following a nutrition plan since October and is working on reducing sweets.   Metformin was discussed as a potential aid to control hunger and reduce sweet cravings, which may assist in weight loss. Metformin can cause modest weight loss (8-12 pounds) and may help reduce insulin resistance, contributing to her prediabetic state. She is open to considering metformin but is concerned about long-term use. Metformin may cause gastrointestinal upset if sweets or simple carbohydrates are consumed, which can deter consumption of these foods. - Consider starting metformin once daily at breakfast to aid in weight loss and control sweet cravings. - Encourage adherence to the nutrition plan, focusing on reducing sweets intake and avoiding sugar-sweetened beverages. - Suggest trying low-carb wraps or keto/Sara Lee bread for lunch to reduce calorie intake. - Recommend using Stevia as a sweetener alternative. - Provide a list of 100-calorie snacks to help manage hunger. - Encourage her to remove unhealthy foods from her environment and focus on meal planning.  Prediabetes She is in a prediabetic state with a strong family history of  type 2 diabetes. Her last A1c was 5.8, improved from 6.2 at the start of her treatment.Discussed how  Insulin resistance plays a role in weight gain and progression to prediabetes and ultimately could progress to Type 2 diabetes if goes untreated with weight loss and possibly medications .  Metformin may help prevent progression to diabetes by reducing insulin resistance and aiding in weight loss. She is  concerned about possibly needing to continue a medication like metformin indefinitely.  Discussed if prediabetes goes into "remission", metformin might be discontinued, but dietary vigilance is necessary to maintain any weight loss and encourage further weight loss if indicated. - Consider starting metformin to improve insulin resistance and prevent progression to diabetes. - Monitor A1c levels to assess improvement in prediabetic status.  Hypertension Hypertension improved, asymptomatic, and no significant medication side effects noted.  Medication(s): Cozaar 100 mg daily   Lozol 2.5 mg daily   Spironolactone 25 mg daily  Renal function stable/normal range.    BP Readings from Last 3 Encounters:  07/17/23 119/78  07/04/23 139/78  07/03/23 117/73   Lab Results  Component Value Date   CREATININE 1.00 07/04/2023   CREATININE 1.00 05/23/2023   CREATININE 0.88 04/12/2023   No results found for: "GFR"  Plan: Continue all antihypertensives at current dosages. Continue to work on nutrition plan to promote weight loss and improve BP control.   Hyperlipidemia LDL is at goal. Medication(s): Lipitor 10 mg daily. No side effects Cardiovascular risk factors: dyslipidemia, family history of premature cardiovascular disease, hypertension, obesity (BMI >= 30 kg/m2), and sedentary lifestyle  Lab Results  Component Value Date   CHOL 153 02/28/2023   HDL 54 02/28/2023   LDLCALC 87 02/28/2023   TRIG 60 02/28/2023   CHOLHDL 3.1 04/09/2022   CHOLHDL 2.8 04/07/2021   CHOLHDL 2.8 11/13/2019   Lab Results  Component Value Date   ALT 15 07/04/2023   AST 17 07/04/2023   ALKPHOS 63 07/04/2023   BILITOT 0.5 07/04/2023   The 10-year ASCVD risk score (Arnett DK, et al., 2019) is: 4.4%   Values used to calculate the score:     Age: 42 years     Sex: Female     Is Non-Hispanic African American: Yes     Diabetic: No     Tobacco smoker: No     Systolic Blood Pressure: 119 mmHg     Is BP  treated: Yes     HDL Cholesterol: 54 mg/dL     Total Cholesterol: 153 mg/dL  Plan: Continue statin. Continue to work on nutrition plan -decreasing simple carbohydrates, increasing lean proteins, decreasing saturated fats and cholesterol , avoiding trans fats and exercise as able to promote weight loss, improve lipids and decrease cardiovascular risks.   Vitamin D Deficiency She has vitamin D deficiency and is currently on Ergocalciferol 50,000 units once weekly. At goal level of 50-70. No N/V or muscle weakness or other side effect with Ergo.  Last vitamin D Lab Results  Component Value Date   VD25OH 65.8 06/06/2023    - Continue Ergocalciferol 50,000 units once weekly.  Low vitamin D levels can be associated with adiposity and may result in leptin resistance and weight gain. Also associated with fatigue.  Currently on vitamin D supplementation without any adverse effects such as nausea, vomiting or muscle weakness.    General Health Maintenance She is working on lifestyle modifications to improve her overall health, including dietary changes and weight management strategies. - Encourage adherence to dietary changes  and weight management strategies. - Provide education on healthy eating habits and meal planning.  Follow-up She is scheduled to see Doctor O in April and will follow up with the current provider two weeks after that appointment. - Schedule follow-up appointment with the current provider on April 16th at 2 PM.  Vitals Temp: (!) 97.4 F (36.3 C) BP: 119/78 Pulse Rate: 67 SpO2: 98 %   Anthropometric Measurements Height: 5\' 4"  (1.626 m) Weight: 234 lb (106.1 kg) BMI (Calculated): 40.15 Weight at Last Visit: 234lb Weight Lost Since Last Visit: 0 Weight Gained Since Last Visit: 0 Starting Weight: 236lb Total Weight Loss (lbs): 2 lb (0.907 kg) Peak Weight: 236lb   Body Composition  Body Fat %: 47.2 % Fat Mass (lbs): 110.8 lbs Muscle Mass (lbs): 117.8  lbs Total Body Water (lbs): 78.8 lbs Visceral Fat Rating : 15   Other Clinical Data Fasting: no Labs: no Today's Visit #: 7 Starting Date: 02/28/23     ASSESSMENT AND PLAN:  Diet: Lindie is currently in the action stage of change. As such, her goal is to continue with weight loss efforts. She has agreed to Category 1 Plan.  Exercise: Nathalya has been instructed to work up to a goal of 150 minutes of combined cardio and strengthening exercise per week and that some exercise is better than none for weight loss and overall health benefits.   Behavior Modification:  We discussed the following Behavioral Modification Strategies today: increasing lean protein intake, decreasing simple carbohydrates, increasing vegetables, increase H2O intake, increase high fiber foods, meal planning and cooking strategies, ways to avoid boredom eating, better snacking choices, emotional eating strategies , avoiding temptations, and planning for success. We discussed various medication options to help [facility] with her weight loss efforts and we both agreed to continue to work on nutritional and behavioral strategies to promote weight loss.  .  Return in about 2 weeks (around 07/31/2023).Marland Kitchen She was informed of the importance of frequent follow up visits to maximize her success with intensive lifestyle modifications for her multiple health conditions.  Attestation Statements:   Reviewed by clinician on day of visit: allergies, medications, problem list, medical history, surgical history, family history, social history, and previous encounter notes.   Time spent on visit including pre-visit chart review and post-visit care and charting was 33 minutes.    Lakeithia Rasor, PA-C

## 2023-07-18 ENCOUNTER — Ambulatory Visit: Admitting: Licensed Clinical Social Worker

## 2023-07-22 ENCOUNTER — Ambulatory Visit: Payer: 59 | Admitting: Family Medicine

## 2023-07-31 ENCOUNTER — Ambulatory Visit (INDEPENDENT_AMBULATORY_CARE_PROVIDER_SITE_OTHER): Admitting: Family Medicine

## 2023-07-31 ENCOUNTER — Ambulatory Visit (INDEPENDENT_AMBULATORY_CARE_PROVIDER_SITE_OTHER): Payer: PRIVATE HEALTH INSURANCE | Admitting: Licensed Clinical Social Worker

## 2023-07-31 ENCOUNTER — Encounter (INDEPENDENT_AMBULATORY_CARE_PROVIDER_SITE_OTHER): Payer: Self-pay | Admitting: Family Medicine

## 2023-07-31 VITALS — BP 110/75 | HR 70 | Temp 97.7°F | Ht 64.0 in | Wt 231.0 lb

## 2023-07-31 DIAGNOSIS — I10 Essential (primary) hypertension: Secondary | ICD-10-CM | POA: Diagnosis not present

## 2023-07-31 DIAGNOSIS — Z6841 Body Mass Index (BMI) 40.0 and over, adult: Secondary | ICD-10-CM

## 2023-07-31 DIAGNOSIS — E669 Obesity, unspecified: Secondary | ICD-10-CM

## 2023-07-31 DIAGNOSIS — E559 Vitamin D deficiency, unspecified: Secondary | ICD-10-CM | POA: Diagnosis not present

## 2023-07-31 DIAGNOSIS — F411 Generalized anxiety disorder: Secondary | ICD-10-CM

## 2023-07-31 DIAGNOSIS — F331 Major depressive disorder, recurrent, moderate: Secondary | ICD-10-CM | POA: Diagnosis not present

## 2023-07-31 DIAGNOSIS — Z6839 Body mass index (BMI) 39.0-39.9, adult: Secondary | ICD-10-CM

## 2023-07-31 DIAGNOSIS — R7303 Prediabetes: Secondary | ICD-10-CM | POA: Diagnosis not present

## 2023-07-31 MED ORDER — VITAMIN D (ERGOCALCIFEROL) 1.25 MG (50000 UNIT) PO CAPS: 50000.0000 [IU] | ORAL_CAPSULE | ORAL | 0 refills | Status: DC

## 2023-07-31 NOTE — Progress Notes (Signed)
 Veronica [name], D.O.  ABFM, ABOM Specializing in Clinical Bariatric Medicine  Office located at: [address] [place], [place]  [address]   Assessment and Plan:  No orders of the defined types were placed in this encounter.   Medications Discontinued During This Encounter  Medication Reason   flunisolide (NASALIDE) 25 MCG/ACT (0.025%) SOLN    Vitamin D, Ergocalciferol, (DRISDOL) 1.25 MG (50000 UNIT) CAPS capsule Reorder     Meds ordered this encounter  Medications   Vitamin D, Ergocalciferol, (DRISDOL) 1.25 MG (50000 UNIT) CAPS capsule    Sig: Take 1 capsule (50,000 Units total) by mouth every 7 (seven) days.    Dispense:  5 capsule    Refill:  0      FOR THE DISEASE OF OBESITY:  BMI 40.0-44.9, adult (HCC) - current BMI 39.63 Obesity with starting BMI of 40.5 Assessment & Plan: Since last office visit on 07/17/2023, patient's muscle mass has decreased by 1.2 lbs. Fat mass has decreased by 2.2 lbs. Total body water has decreased by 2.4 lbs.  Counseling done on how various foods will affect these numbers and how to maximize success  Total lbs lost to date: 5 lbs Total weight loss percentage to date: 2.12%    Recommended Dietary Goals Veronica [name] is currently in the action stage of change. As such, her goal is to continue weight management plan.  She has agreed to: continue current plan   Behavioral Intervention We discussed the following today: increasing lean protein intake to established goals and continue to practice mindfulness when eating, eating skinless chicken,   Additional resources provided today: Handout on Examples of Low Glycemic Index and Low Calorie Fruits & Vegetables and Handout on risks/ benefits of metformin and associated Myths of use  Evidence-based interventions for health behavior change were utilized today including the discussion of self monitoring techniques, problem-solving barriers and SMART goal setting techniques.   Regarding patient's  less desirable eating habits and patterns, we employed the technique of small changes.   Pt will specifically work on: Avoid caloric beverages  for next visit.    Recommended Physical Activity Goals Veronica [name] has been advised to work up to 150 minutes of moderate intensity aerobic activity a week and strengthening exercises 2-3 times per week for cardiovascular health, weight loss maintenance and preservation of muscle mass.   She has agreed to :  Continue current level of physical activity    Pharmacotherapy We both agreed to : continue with nutritional and behavioral strategies   FOR ASSOCIATED CONDITIONS ADDRESSED TODAY:  Vitamin D deficiency Assessment & Plan: Veronica [name] is taking ERGO 50000 once weekly. Tolerating well, denies any adverse SE. Continue supplementation regimen. Will refill today.   Relevant Orders: -     Vitamin D (Ergocalciferol); Take 1 capsule (50,000 Units total) by mouth every 7 (seven) days.  Dispense: 5 capsule; Refill: 0   Prediabetes Assessment & Plan: Veronica [name] is not on any medications for this condition. Diet/exercise approach. Hunger well controlled, pt admits to occasional sugar cravings. During LOV, pt was recommended Metformin to help curve carb cravings. Pt has reservations about medication because she is worried about being "stuck on it forever". Educated pt on how Metformin impacts insulin and therefore hinders cravings. Discussed risks/benefits on Metformin, handout provided at pts request. Will not start medication today, but may revisit in the future. Continue increasing lean protein and decreasing simple carbs in diet. Will continue to monitor condition closely.    Essential hypertension, benign Assessment &  Plan: BP Readings from Last 3 Encounters:  07/31/23 110/75  07/17/23 119/78  07/04/23 139/78  Relevant medications: Losartan 100 mg daily, Spironolactone 25 mg daily, and Indapamide 2.5 mg daily Condition well controlled. Pt presented with  stable BP. Continue current medication regimen. Continue following our low salt, heart healthy meal plan and engaging in a regular exercise program. We will continue to monitor symptoms as they relate to the her weight loss journey.  Follow up:   Return in about 2 weeks (around 08/14/2023). She was informed of the importance of frequent follow up visits to maximize her success with intensive lifestyle modifications for her multiple health conditions.  Subjective:   Chief complaint: Obesity Veronica [name] is here to discuss her progress with her obesity treatment plan. She is on the the Category 1 Plan and states she is following her eating plan approximately 50% of the time. She states she is walking 30 minutes 5-7 days per week.  Interval History:  Veronica [name] is here for a follow up office visit. Since last OV on 07/17/2023, Veronica [name] is down 3 lbs. She is doing well so far with her MP. Pt denies weighing her foods at home but does not feel like she's getting all of her protein in. Additionally, Veronica [name] admits to occasionally skipping meals and being hungry late at night. She is eating foods like eggs and [place] bacon for breakfast.   Pharmacotherapy for weight loss: She is currently taking no anti-obesity medication.   Review of Systems:  Pertinent positives were addressed with patient today.  Reviewed by clinician on day of visit: allergies, medications, problem list, medical history, surgical history, family history, social history, and previous encounter notes.  Weight Summary and Biometrics   Weight Lost Since Last Visit: 3lb  Weight Gained Since Last Visit: 0   Vitals Temp: 97.7 F (36.5 C) BP: 110/75 Pulse Rate: 70 SpO2: 98 %   Anthropometric Measurements Height: 5\' 4"  (1.626 m) Weight: 231 lb (104.8 kg) BMI (Calculated): 39.63 Weight at Last Visit: 234lb Weight Lost Since Last Visit: 3lb Weight Gained Since Last Visit: 0 Starting Weight: 236lb Total Weight Loss (lbs): 5 lb  (2.268 kg) Peak Weight: 236lb   Body Composition  Body Fat %: 46.9 % Fat Mass (lbs): 108.6 lbs Muscle Mass (lbs): 116.6 lbs Total Body Water (lbs): 76.4 lbs Visceral Fat Rating : 15   Other Clinical Data Fasting: yes Labs: no Today's Visit #: 8 Starting Date: 02/28/23    Objective:   PHYSICAL EXAM: Blood pressure 110/75, pulse 70, temperature 97.7 F (36.5 C), height 5\' 4"  (1.626 m), weight 231 lb (104.8 kg), SpO2 98%. Body mass index is 39.65 kg/m.  General: she is overweight, cooperative and in no acute distress. PSYCH: Has normal mood, affect and thought process.   HEENT: EOMI, sclerae are anicteric. Lungs: Normal breathing effort, no conversational dyspnea. Extremities: Moves * 4 Neurologic: A and O * 3, good insight  DIAGNOSTIC DATA REVIEWED: BMET    Component Value Date/Time   NA 140 07/04/2023 0943   K 3.9 07/04/2023 0943   CL 102 07/04/2023 0943   CO2 24 07/04/2023 0943   GLUCOSE 98 07/04/2023 0943   GLUCOSE 72 05/23/2023 1443   BUN 15 07/04/2023 0943   CREATININE 1.00 07/04/2023 0943   CREATININE 1.00 05/23/2023 1443   CALCIUM 9.6 07/04/2023 0943   GFRNONAA 64 04/15/2020 1121   GFRNONAA 87 06/13/2016 1436   GFRAA 74 04/15/2020 1121   GFRAA >89 06/13/2016 1436  Lab Results  Component Value Date   HGBA1C 5.8 (H) 06/06/2023   HGBA1C 6.2 (H) 02/11/2009   Lab Results  Component Value Date   INSULIN 27.6 (H) 02/28/2023   INSULIN 23.1 08/03/2022   Lab Results  Component Value Date   TSH 1.54 01/11/2023   CBC    Component Value Date/Time   WBC 6.5 07/04/2023 0943   WBC 6.8 05/23/2023 1443   RBC 4.66 07/04/2023 0943   RBC 4.72 05/23/2023 1443   HGB 12.8 07/04/2023 0943   HCT 40.1 07/04/2023 0943   PLT 371 07/04/2023 0943   MCV 86 07/04/2023 0943   MCH 27.5 07/04/2023 0943   MCH 26.9 (L) 05/23/2023 1443   MCHC 31.9 07/04/2023 0943   MCHC 31.4 (L) 05/23/2023 1443   RDW 13.1 07/04/2023 0943   Iron Studies    Component Value  Date/Time   IRON 114 05/18/2008 0405   TIBC 300 05/18/2008 0405   FERRITIN 19 05/18/2008 0405   IRONPCTSAT 38 05/18/2008 0405   Lipid Panel     Component Value Date/Time   CHOL 153 02/28/2023 1110   TRIG 60 02/28/2023 1110   HDL 54 02/28/2023 1110   CHOLHDL 3.1 04/09/2022 1106   CHOLHDL 4.4 06/13/2016 1436   VLDL 30 06/13/2016 1436   LDLCALC 87 02/28/2023 1110   Hepatic Function Panel     Component Value Date/Time   PROT 7.5 07/04/2023 0943   ALBUMIN 4.1 07/04/2023 0943   AST 17 07/04/2023 0943   ALT 15 07/04/2023 0943   ALKPHOS 63 07/04/2023 0943   BILITOT 0.5 07/04/2023 0943   BILIDIR 3.7 (H) 07/28/2012 1200   IBILI 1.7 (H) 07/28/2012 1200      Component Value Date/Time   TSH 1.54 01/11/2023 1057   Nutritional Lab Results  Component Value Date   VD25OH 65.8 06/06/2023   VD25OH 34 01/11/2023    Attestations:   I, Veronica [name], acting as a Stage manager for [employer], Veronica [name]., have compiled all relevant documentation for today's office visit on behalf of Veronica Lot, Veronica [name], while in the presence of [employer], Veronica [name].  Reviewed by clinician on day of visit: allergies, medications, problem list, medical history, surgical history, family history, social history, and previous encounter notes pertinent to patient's obesity diagnosis. I have spent 41 minutes in the care of the patient today including: preparing to see patient (e.g. review and interpretation of tests, old notes ), obtaining and/or reviewing separately obtained history, performing a medically appropriate examination or evaluation, counseling and educating the patient, ordering medications, test or procedures, documenting clinical information in the electronic or other health care record, and independently interpreting results and communicating results to the patient, family, or caregiver   I have reviewed the above documentation for accuracy and completeness, and I agree with the above. Veronica [name],  D.O.  The 21st Century Cures Act was signed into law in 2016 which includes the topic of electronic health records.  This provides immediate access to information in MyChart.  This includes consultation notes, operative notes, office notes, lab results and pathology reports.  If you have any questions about what you read please let us know at your next visit so we can discuss your concerns and take corrective action if need be.  We are right here with you.

## 2023-07-31 NOTE — Progress Notes (Signed)
 [facility] Counselor/Therapist Progress Note  Patient ID: Veronica [name], MRN: [id number]    Date: 07/31/23  Time Spent: 101  pm - 0200 pm : 59 Minutes  Treatment Type: Individual Therapy.  Symptoms of anxiety and depression related to work stress and marriage   Mental Status Exam: Appearance:  Casual     Behavior: Appropriate  Motor: Normal  Speech/Language:  Clear and Coherent  Affect: Appropriate  Mood: normal  Thought process: normal  Thought content:   WNL  Sensory/Perceptual disturbances:   WNL  Orientation: oriented to person, place, time/date, situation, day of week, month of year, and year  Attention: Good  Concentration: Good  Memory: WNL  Fund of knowledge:  Good  Insight:   Good  Judgment:  Good  Impulse Control: Good    Risk Assessment: Danger to Self:  No Self-injurious Behavior: No Danger to Others: No Duty to Warn:no Physical Aggression / Violence:No  Access to Firearms a concern: No  Gang Involvement:No    Subjective:    [name] participated from office, located at [employer] with Clinician present. Shamecca consented to treatment.   Ieshia presented for her session reporting her frustration at her spouse. Antionette reports that their situation remains the same and refuses to leave. She states that she has considered leaving and going to her parents. Patient reports working all week and coming home the house a mess after she left it clean and in order. Patient states that he continues to contribute minimally financially and this is a burden to her. Patient reports that her son was arrested for fighting over the weekend and she had ask her husband to take him to get his glasses fixed. She reports that he made such scene that her son refused to go with his Dad. Patient reports feeling overwhelmed and exhausted.   Clinician provided support and validation based on active listening and verbal interaction. Clinician encouraged patient  to identify what is best fo her and her children when making decisions about her situation. Clinician also encouraged patient to consider the negative effects her situation is having on her both mentally and physically. Clinician and patient also processed having a discussion with her husband about the negative impact this is having on her and their family. Clinician can see that patient is a strong driven woman with determination. However, the effects of the unhealthy relationship with her spouse remains evident in patients level of anxiety and depression.   Patient was fully engaged and transparent in her interaction with Clinician. Patient reports that there is no talking to her husband because he will dismiss her feelings and/or get angry. Patient states she is not afraid of her husband but he does intimidate her she reports. Patient reports she needs to pray and God to give her peace regardless of the outcome of this situation. Patient will continue to engage in bi weekly therapy and treatment plan will be reviewed by 01/08/2024.  Continued goals for [facility] include: Goals for patient include: Emotional Wellbeing: Identify and express emotions openly. Practice self-compassion and positive self-talk.  Learn healthy coping mechanisms for stress and difficult emotions.  Continue with biweekly therapy to address underlying issues.  Physical Health: Establish a regular sleep schedule.  Engage in regular physical activity, even if starting with short walks.  Maintain a balanced diet with nutritious foods.  Limit alcohol and substance use.  Social Connection: Reconnect with supportive friends and family members.  Consider joining a support group for people  going through divorce.  Gradually expand social activities as comfort level allows.  Personal Growth: Explore new hobbies and interests.  Set small, achievable goals for personal development.  Learn stress management techniques like meditation or  deep breathing.  Develop a positive outlook on the future.  Important considerations: Be patient with yourself: Healing takes time, and there will be ups and downs.  Seek professional help: Therapy can be crucial for managing depression and anxiety after a divorce.  Prioritize self-care: Make time for activities that nourish your mind and body.  Set realistic goals: Break down larger goals into smaller, manageable steps.  Communicate your needs: Let loved ones know how they can support you.      Interventions: Cognitive Behavioral Therapy and Assertiveness/Communication   Diagnosis: Generalized Anxiety Disorder/Major Depressive Disorder, recurrent moderate   Phyllis Ginger MSW, LCSW/DATE 07/31/2023

## 2023-08-02 NOTE — Progress Notes (Signed)
    SUBJECTIVE:   CHIEF COMPLAINT: cough HPI:   Veronica [name] is a 63 y.o.  with history notable for snoring, obesity, and SLE presenting for follow up on cough.    The patient has had an ongoing chronic dry nonproductive cough for more than 8 weeks at this time.  She has been treated with antibiotics.  She additionally most recently reported hoarseness of her voice.  She is a non-smoker but has a history of significant smoke exposure.  She also has a history of systemic lupus.  The patient reports she recently completed a course of antibiotics.  She now feels as if she has thick white discharge and some vaginal itching.  She requests to dose of fluconazole.  The patient has been using a deodorant.  She uses unscented soap.  She has stopped using the deodorant.  She has a pruritic rash for which she has been using hydrocortisone twice a day for for about a week.  This is slightly improved but now it is scaling in nature.  PERTINENT  PMH / PSH/Family/Social History : Obtained and reviewed as appropriate  OBJECTIVE:   BP 122/80   Pulse 86   Ht 5\' 4"  (1.626 m)   Wt 235 lb (106.6 kg)   SpO2 98%   BMI 40.34 kg/m   Today's weight:  Last Weight  Most recent update: 08/05/2023  9:14 AM    Weight  106.6 kg (235 lb)            Review of prior weights: [employer]   08/05/23 0914  Weight: 235 lb (106.6 kg)    HEENT: EOMI. Sclera without injection or icterus. MMM. External auditory canal examined and WNL. TM normal appearance, no erythema or bulging. Neck: Supple.  Cardiac: Regular rate and rhythm. Normal S1/S2. No murmurs, rubs, or gallops appreciated. Lungs: Clear bilaterally to ascultation.  Psych: Pleasant and appropriate  Skin exam evidence of acanthosis nigra cans also evidence of multiple skin tags she will report these are also in her skin folds in the inguinal area.  In intertriginous areas there is an erythematous patch with surrounding macules and erythematous  lesions. ASSESSMENT/PLAN:   Assessment & Plan Essential hypertension, benign At goal continue current medications Seasonal allergies Refilled Zyrtec Yeast vaginitis She will return for a pelvic exam if this does not improve discussed at length fluconazole prescribed Screening for tuberculosis Asymptomatic PPD placed for work today she will return on Wednesday Intertrigo Suspect a superimposed contact of otitis.  Rx nystatin powder.  Discussed gentle cleanser unscented soap unscented see deodorant and cautious use of hydrocortisone given skin pigment. Hoarseness Given history of smoke exposure and her cough concern for upper airway pathology.  Referral to ENT.  Of note the patient does have a history significant for gastroesophageal reflux and she follows with GI for this.  She is already on a PPI. Chronic cough Given history of smoke exposure and duration of cough will obtain CT chest.  She has already been referred to pulmonary medicine she had possible restrictive findings on her prior spirometry and has an underlying diagnosis of lupus which could predispose to a restrictive lung disorder Skin tag Referral to dermatology   [name], MD  Family Medicine Teaching Service  [facility]

## 2023-08-05 ENCOUNTER — Ambulatory Visit (INDEPENDENT_AMBULATORY_CARE_PROVIDER_SITE_OTHER): Admitting: Family Medicine

## 2023-08-05 ENCOUNTER — Encounter: Payer: Self-pay | Admitting: Family Medicine

## 2023-08-05 ENCOUNTER — Other Ambulatory Visit: Payer: Self-pay

## 2023-08-05 VITALS — BP 122/80 | HR 86 | Ht 64.0 in | Wt 235.0 lb

## 2023-08-05 DIAGNOSIS — R490 Dysphonia: Secondary | ICD-10-CM

## 2023-08-05 DIAGNOSIS — B3731 Acute candidiasis of vulva and vagina: Secondary | ICD-10-CM

## 2023-08-05 DIAGNOSIS — Z111 Encounter for screening for respiratory tuberculosis: Secondary | ICD-10-CM | POA: Diagnosis not present

## 2023-08-05 DIAGNOSIS — I10 Essential (primary) hypertension: Secondary | ICD-10-CM

## 2023-08-05 DIAGNOSIS — R053 Chronic cough: Secondary | ICD-10-CM

## 2023-08-05 DIAGNOSIS — L918 Other hypertrophic disorders of the skin: Secondary | ICD-10-CM

## 2023-08-05 DIAGNOSIS — L304 Erythema intertrigo: Secondary | ICD-10-CM

## 2023-08-05 DIAGNOSIS — J302 Other seasonal allergic rhinitis: Secondary | ICD-10-CM

## 2023-08-05 DIAGNOSIS — R052 Subacute cough: Secondary | ICD-10-CM

## 2023-08-05 MED ORDER — FLUCONAZOLE 150 MG PO TABS: 150.0000 mg | ORAL_TABLET | Freq: Once | ORAL | 0 refills | Status: AC

## 2023-08-05 MED ORDER — NYSTATIN 100000 UNIT/GM EX POWD: 1.0000 | Freq: Three times a day (TID) | CUTANEOUS | 3 refills | Status: DC

## 2023-08-05 MED ORDER — LOSARTAN POTASSIUM 100 MG PO TABS
100.0000 mg | ORAL_TABLET | Freq: Every day | ORAL | 3 refills | Status: AC
Start: 2023-08-05 — End: ?

## 2023-08-05 MED ORDER — CETIRIZINE HCL 10 MG PO TABS: 10.0000 mg | ORAL_TABLET | Freq: Every day | ORAL | 11 refills | Status: AC

## 2023-08-05 MED ORDER — ATORVASTATIN CALCIUM 10 MG PO TABS: 10.0000 mg | ORAL_TABLET | Freq: Every day | ORAL | 3 refills | Status: AC

## 2023-08-05 NOTE — Assessment & Plan Note (Signed)
 At goal continue current medications

## 2023-08-05 NOTE — Patient Instructions (Signed)
 It was wonderful to see you today.  Please bring ALL of your medications with you to every visit.   Today we talked about:  CALL TODAY  Mammogram at [facility] Address: [address] [place], [place], [place] [address] Hours: Open ? Closes 5 PM Phone: [phone]  I sent in powder for your rash  I sent in treatment for yeast infection  You will be called about a CT  I have referred you to ENT and Dermatology  to further evaluate your concern. If you do not received a phone call about this appointment within 3-4 weeks, please call our office back at [phone]. Clemencia Course coordinates our referrals and can assist you in this.    Please follow up in 3 months   Thank you for choosing [facility].   Please call [phone] with any questions about today's appointment.  Please be sure to schedule follow up at the front  desk before you leave today.   [name], MD  Family Medicine

## 2023-08-07 ENCOUNTER — Ambulatory Visit (HOSPITAL_COMMUNITY)
Admission: RE | Admit: 2023-08-07 | Discharge: 2023-08-07 | Disposition: A | Discharge status: Home / Self Care | Source: Ambulatory Visit | Attending: Family Medicine | Admitting: Family Medicine

## 2023-08-07 ENCOUNTER — Ambulatory Visit

## 2023-08-07 DIAGNOSIS — R053 Chronic cough: Secondary | ICD-10-CM | POA: Diagnosis present

## 2023-08-07 DIAGNOSIS — Z111 Encounter for screening for respiratory tuberculosis: Secondary | ICD-10-CM

## 2023-08-07 LAB — TB SKIN TEST
Induration: 2 mm
TB Skin Test: NEGATIVE

## 2023-08-07 NOTE — Progress Notes (Signed)
 Patient is here for a PPD read.  It was placed on 08/05/2023 in the left forearm @ 0945 am.    PPD RESULTS: Upon evaluation, noticed bruising around area of PPD placement. Asked Dr. McDiarmid to evaluate as well. Patient does report bruising shortly after PPD placement.  Result: negative Measured approx 2 mm of slight induration.   Letter created and given to patient for documentation purposes. Veronda Prude, RN

## 2023-08-09 ENCOUNTER — Encounter: Payer: Self-pay | Admitting: Family Medicine

## 2023-08-13 NOTE — Progress Notes (Unsigned)
 SUBJECTIVE: Discussed the use of AI scribe software for clinical note transcription with the patient, who gave verbal consent to proceed.  Chief Complaint: Obesity  Interim History: She is up 2 lbs since her last visit.    Veronica [name] is here to discuss her progress with her obesity treatment plan. She is on the Category 1 Plan and states she is following her eating plan approximately 50 % of the time. She states she is exercising walking for 30 minutes 5 times per week.  Veronica [name] is a 63 year old female who presents for follow-up of her obesity treatment plan.  She [name] gained two pounds since her last visit, attributing this to increased stress due to her husband's job situation, which [name] led to increased snacking, particularly on sweets. She enjoys walking in the park and visiting game rooms to relieve stress, where she avoids snacking. She is planning a trip to [place] and [place] in May, which she anticipates will involve a lot of walking.  She [name] a history of hypertension and hypercholesterolemia.  She also [name] a history of vitamin D deficiency and notes that she [name] two vitamin D tablets left, with a refill planned at [facility] pharmacy.  She reports difficulty sleeping due to stress and [name] been prescribed cyclobenzaprine 5 mg three times a day, which she hopes will help her relax and sleep better.  No consumption of salty snacks, but she admits to eating cookies. She is not an [place] candy person and plans to focus on protein intake during meals. OBJECTIVE: Visit Diagnoses: Problem List Items Addressed This Visit     Prediabetes - Primary   Relevant Medications   metFORMIN (GLUCOPHAGE) 500 MG tablet   Vitamin D deficiency   Relevant Medications   Vitamin D, Ergocalciferol, (DRISDOL) 1.25 MG (50000 UNIT) CAPS capsule   Essential hypertension, benign (Chronic)   Other Visit Diagnoses       Obesity with starting BMI of 40.5       Relevant Medications    metFORMIN (GLUCOPHAGE) 500 MG tablet     Obesity Veronica [name] gained two pounds since her last visit, likely due to increased stress and snacking on sweets related to her husband's job situation.  Alternative stress-relief activities such as walking and visiting game rooms were discussed.  Metformin was suggested to help curb cravings and assist with weight management at the last visit and she expressed comfort with starting metformin after reading about it. Metformin may cause stomach upset, especially with simple carbohydrates, but is generally well-tolerated when taken with food.  We discussed starting metformin, potential risks, benefits and possible side effects and she is agreeable to begin metformin at this time.   - Start metformin 500 mg, instruct to take half a tablet (250 mg) once daily initially, and increase to a full tablet after a week if tolerated. - Advise to monitor for stomach upset, especially if consuming simple carbohydrates, and to take metformin with food such as [place] yogurt. - Encourage focus on protein intake during meals and to be creative in meeting protein needs. - Discuss alternative stress-relief activities such as walking and visiting game rooms.   Prediabetes Last A1c was 5.8  Medication(s): None Polyphagia:Yes Lab Results  Component Value Date   HGBA1C 5.8 (H) 06/06/2023   HGBA1C 6.2 (H) 02/28/2023   HGBA1C 5.7 08/03/2022   HGBA1C 5.8 09/01/2021   HGBA1C 5.7 03/03/2021   Lab Results  Component Value Date   INSULIN 27.6 (  H) 02/28/2023   INSULIN 23.1 08/03/2022    Plan: Start metformin 250 mg daily x 1 week and tolerated well, increase to 500 mg once daily Continue working on nutrition plan to decrease simple carbohydrates, increase lean proteins and exercise to promote weight loss, improve glycemic control and prevent progression to Type 2 diabetes.   Meds ordered this encounter  Medications   Vitamin D, Ergocalciferol, (DRISDOL) 1.25 MG  (50000 UNIT) CAPS capsule    Sig: Take 1 capsule (50,000 Units total) by mouth every 7 (seven) days.    Dispense:  5 capsule    Refill:  0   metFORMIN (GLUCOPHAGE) 500 MG tablet    Sig: Take 1 tablet (500 mg total) by mouth daily.    Dispense:  30 tablet    Refill:  0   Hypertension Hypertension asymptomatic, reasonably well controlled, and no significant medication side effects noted.  Medication(s): Indapamide 5 mg daily   Losartan 100 mg daily   Spironolactone 25 mg daily  BP Readings from Last 3 Encounters:  08/14/23 126/75  08/05/23 122/80  07/31/23 110/75   Lab Results  Component Value Date   CREATININE 1.00 07/04/2023   CREATININE 1.00 05/23/2023   CREATININE 0.88 04/12/2023   No results found for: "GFR"  Plan: Continue all antihypertensives at current dosages. Continue to work on nutrition plan to promote weight loss and improve BP control.   Vitamin D deficiency She is running low on her current supply of vitamin D ergocalciferol 50,000 units once weekly.  No nausea, no vomiting or muscle weakness or other side effects with ergocalciferol. Last vitamin D Lab Results  Component Value Date   VD25OH 65.8 06/06/2023   Plan : Refill ergocalciferol 50,000 units once weekly Low vitamin D levels can be associated with adiposity and may result in leptin resistance and weight gain. Also associated with fatigue.  Currently on vitamin D supplementation without any adverse effects such as nausea, vomiting or muscle weakness.   Meds ordered this encounter  Medications   Vitamin D, Ergocalciferol, (DRISDOL) 1.25 MG (50000 UNIT) CAPS capsule    Sig: Take 1 capsule (50,000 Units total) by mouth every 7 (seven) days.    Dispense:  5 capsule    Refill:  0   metFORMIN (GLUCOPHAGE) 500 MG tablet    Sig: Take 1 tablet (500 mg total) by mouth daily.    Dispense:  30 tablet    Refill:  0     Follow-up She [name] a follow-up appointment scheduled with Doctor Opalski at the  beginning of May and will see the current provider after returning from a trip to [place]. - Schedule follow-up appointment with the current provider on May 28th at 11:30 AM.  Vitals Temp: 98 F (36.7 C) BP: 126/75 Pulse Rate: 72 SpO2: 98 %   Anthropometric Measurements Height: 5\' 4"  (1.626 m) Weight: 233 lb (105.7 kg) BMI (Calculated): 39.97 Weight at Last Visit: 231 lb Weight Lost Since Last Visit: 0 Weight Gained Since Last Visit: 2 lb Starting Weight: 236 lb Total Weight Loss (lbs): 3 lb (1.361 kg) Peak Weight: 236 lb   Body Composition  Body Fat %: 47.6 % Fat Mass (lbs): 111.2 lbs Muscle Mass (lbs): 116.2 lbs Total Body Water (lbs): 79.2 lbs Visceral Fat Rating : 15   Other Clinical Data Fasting: no Labs: no Today's Visit #: 9 Starting Date: 02/28/23     ASSESSMENT AND PLAN:  Diet: Veronica [name] is currently in the action stage of change.  As such, her goal is to continue with weight loss efforts. She [name] agreed to Category 1 Plan.  Exercise: Veronica [name] [name] been instructed to work up to a goal of 150 minutes of combined cardio and strengthening exercise per week for weight loss and overall health benefits.   Behavior Modification:  We discussed the following Behavioral Modification Strategies today: increasing lean protein intake, decreasing simple carbohydrates, increasing vegetables, increase H2O intake, increase high fiber foods, no skipping meals, meal planning and cooking strategies, better snacking choices, emotional eating strategies , avoiding temptations, and planning for success. We discussed various medication options to help Veronica [name] with her weight loss efforts and we both agreed to start metformin for primary indication of prediabetes.  Return in about 3 weeks (around 09/04/2023).Aaron Aas She was informed of the importance of frequent follow up visits to maximize her success with intensive lifestyle modifications for her multiple health conditions.  Attestation  Statements:   Reviewed by clinician on day of visit: allergies, medications, problem list, medical history, surgical history, family history, social history, and previous encounter notes.   Time spent on visit including pre-visit chart review and post-visit care and charting was 28 minutes.    Brieonna Crutcher, PA-C

## 2023-08-14 ENCOUNTER — Encounter (INDEPENDENT_AMBULATORY_CARE_PROVIDER_SITE_OTHER): Payer: Self-pay | Admitting: Physician Assistant

## 2023-08-14 ENCOUNTER — Ambulatory Visit (INDEPENDENT_AMBULATORY_CARE_PROVIDER_SITE_OTHER): Admitting: Physician Assistant

## 2023-08-14 ENCOUNTER — Other Ambulatory Visit: Payer: Self-pay

## 2023-08-14 VITALS — BP 126/75 | HR 72 | Temp 98.0°F | Ht 64.0 in | Wt 233.0 lb

## 2023-08-14 DIAGNOSIS — R7303 Prediabetes: Secondary | ICD-10-CM

## 2023-08-14 DIAGNOSIS — Z6839 Body mass index (BMI) 39.0-39.9, adult: Secondary | ICD-10-CM

## 2023-08-14 DIAGNOSIS — E669 Obesity, unspecified: Secondary | ICD-10-CM | POA: Diagnosis not present

## 2023-08-14 DIAGNOSIS — E559 Vitamin D deficiency, unspecified: Secondary | ICD-10-CM

## 2023-08-14 DIAGNOSIS — I10 Essential (primary) hypertension: Secondary | ICD-10-CM

## 2023-08-14 MED ORDER — METFORMIN HCL 500 MG PO TABS: 500.0000 mg | ORAL_TABLET | Freq: Every day | ORAL | 0 refills | Status: DC

## 2023-08-14 MED ORDER — VITAMIN D (ERGOCALCIFEROL) 1.25 MG (50000 UNIT) PO CAPS: 50000.0000 [IU] | ORAL_CAPSULE | ORAL | 0 refills | Status: DC

## 2023-08-15 ENCOUNTER — Ambulatory Visit: Admitting: Licensed Clinical Social Worker

## 2023-08-29 ENCOUNTER — Ambulatory Visit (INDEPENDENT_AMBULATORY_CARE_PROVIDER_SITE_OTHER): Admitting: Family Medicine

## 2023-08-29 ENCOUNTER — Encounter (INDEPENDENT_AMBULATORY_CARE_PROVIDER_SITE_OTHER): Payer: Self-pay | Admitting: Family Medicine

## 2023-08-29 VITALS — BP 108/71 | HR 70 | Temp 97.6°F | Ht 64.0 in | Wt 235.0 lb

## 2023-08-29 DIAGNOSIS — Z6841 Body Mass Index (BMI) 40.0 and over, adult: Secondary | ICD-10-CM

## 2023-08-29 DIAGNOSIS — G43009 Migraine without aura, not intractable, without status migrainosus: Secondary | ICD-10-CM | POA: Diagnosis not present

## 2023-08-29 DIAGNOSIS — R7303 Prediabetes: Secondary | ICD-10-CM

## 2023-08-29 DIAGNOSIS — E669 Obesity, unspecified: Secondary | ICD-10-CM | POA: Diagnosis not present

## 2023-08-29 DIAGNOSIS — E559 Vitamin D deficiency, unspecified: Secondary | ICD-10-CM

## 2023-08-29 MED ORDER — VITAMIN D (ERGOCALCIFEROL) 1.25 MG (50000 UNIT) PO CAPS: 50000.0000 [IU] | ORAL_CAPSULE | ORAL | 0 refills | Status: DC

## 2023-08-29 NOTE — Progress Notes (Signed)
 Veronica [name], D.O.  ABFM, ABOM Specializing in Clinical Bariatric Medicine  Office located at: [address] [place], [place]  [address]   Assessment and Plan:   Medications Discontinued During This Encounter  Medication Reason   Vitamin D , Ergocalciferol , (DRISDOL ) 1.25 MG (50000 UNIT) CAPS capsule Reorder     Meds ordered this encounter  Medications   Vitamin D , Ergocalciferol , (DRISDOL ) 1.25 MG (50000 UNIT) CAPS capsule    Sig: Take 1 capsule (50,000 Units total) by mouth every 7 (seven) days.    Dispense:  5 capsule    Refill:  0    FOR THE DISEASE OF OBESITY:  BMI 40.0-44.9, adult (HCC) - current BMI 40.32 Obesity with starting BMI of 40.5 Assessment & Plan: Since last office visit on 08/14/2023 patient's  Muscle mass has increased by 1.2 lb. Fat mass has increased by 0.2 lb. Total body water has increased by 1 lb.  Counseling done with pt today on how various foods will affect these numbers and how to maximize success  Total lbs lost to date: + 1 lb Total weight loss percentage to date: + 0.42%    Recommended Dietary Goals [name] is currently in the action stage of change. As such, her goal is to continue weight management plan.  She has agreed to: continue current plan   Behavioral Intervention We discussed the following today: increasing lean protein intake to established goals and staying on track while traveling and vacationing  Additional resources provided today: Handout on Healthy Tuna Salad Recipe   Evidence-based interventions for health behavior change were utilized today including the discussion of self monitoring techniques, problem-solving barriers and SMART goal setting techniques.   Regarding patient's less desirable eating habits and patterns, we employed the technique of small changes.   Pt will specifically work on: n/a   Recommended Physical Activity Goals [name] has been advised to work up to 300-450 minutes of moderate intensity  aerobic activity a week and strengthening exercises 2-3 times per week for cardiovascular health, weight loss maintenance and preservation of muscle mass.   She has agreed to : continue to gradually increase the amount and intensity of exercise routine   Pharmacotherapy See Pre-DM note.    ASSOCIATED CONDITIONS ADDRESSED TODAY:  Prediabetes Assessment & Plan: Most recent A1c and fasting insulin : Lab Results  Component Value Date   HGBA1C 5.8 (H) 06/06/2023   HGBA1C 6.2 (H) 02/28/2023   HGBA1C 5.7 08/03/2022   INSULIN  27.6 (H) 02/28/2023   INSULIN  23.1 08/03/2022    LOV, my colleague Shawn wrote for Metformin  500 mg daily; pt has not started it yet. The prescription went through on 4/16.  However when she went to pick up her medications, they never gave her the Metformin  and pt acknowledges forgetting to ask about it. She agrees to contact her pharmacy and obtain drug and will start it.  START METFORMIN .  She understands to take half a tablet (250 mg) once daily initially, and increase to a full tablet after 4-6 days if tolerated.   R/B medication done today and all pt's concerns addressed.  Recommend she take it during lunch b/c most of her cravings and hunger are in the evenings. Continue eating plan.    Migraine without aura and without status migrainosus, not intractable Assessment & Plan: H/o migraine headaches. Sees Dr.Jaffe of neurology. Condition is very well controlled. Reports good compliance and tolerance of Topamax  50 mg daily.   Continue regimen; discussed that we can increase  her Topamax  in the future to help abate her cravings if needed as well.   Vitamin D  deficiency Assessment & Plan: Most recent VD:  Lab Results  Component Value Date   VD25OH 65.8 06/06/2023   VD25OH 34 01/11/2023   Pt is doing well on ergocalciferol  50,000 units once weekly.  No complaints.  Levels at goal and labs current  Continue regimen. Recheck periodically along wt loss journey.   Refill given today.    Follow up:   Return 09/25/2023 at 11:30 am with [place], Veronica Hilt, PA-C. She was informed of the importance of frequent follow up visits to maximize her success with intensive lifestyle modifications for her multiple health conditions.  Subjective:   Chief complaint: Obesity Veronica [name] is here to discuss her progress with her obesity treatment plan.   Interval History:  BRIANNY [name] is here for a follow up office visit. Since last OV on 08/14/2023, Veronica [name] is up 2 lbs. She is on the Category 1 Plan and states she is following her eating plan approximately 50% of the time. She states she is walking 30 minutes 5 days per week. She is planning a trip to [place]  in 2 weeks.   Pharmacotherapy that can aid with weight loss: She is currently taking Topamax  50 mg daily.   Review of Systems:  Pertinent positives were addressed with patient today. Reviewed by clinician on day of visit: allergies, medications, problem list, medical history, surgical history, family history, social history, and previous encounter notes.  Weight Summary and Biometrics   Weight Lost Since Last Visit: 0lb  Weight Gained Since Last Visit: 2lb   Vitals Temp: 97.6 F (36.4 C) BP: 108/71 Pulse Rate: 70 SpO2: 99 %   Anthropometric Measurements Height: 5\' 4"  (1.626 m) Weight: 235 lb (106.6 kg) BMI (Calculated): 40.32 Weight at Last Visit: 233lb Weight Lost Since Last Visit: 0lb Weight Gained Since Last Visit: 2lb Starting Weight: 236lb Total Weight Loss (lbs): 1 lb (0.454 kg) Peak Weight: 236lb   Body Composition  Body Fat %: 47.4 % Fat Mass (lbs): 111.4 lbs Muscle Mass (lbs): 117.4 lbs Total Body Water (lbs): 80.2 lbs Visceral Fat Rating : 15   Other Clinical Data Fasting: No Labs: No Today's Visit #: 10 Starting Date: 02/28/23   Objective:   PHYSICAL EXAM: Blood pressure 108/71, pulse 70, temperature 97.6 F (36.4 C), height 5\' 4"  (1.626 m), weight 235 lb (106.6  kg), SpO2 99%. Body mass index is 40.34 kg/m.  General: she is overweight, cooperative and in no acute distress. PSYCH: Has normal mood, affect and thought process.   HEENT: EOMI, sclerae are anicteric. Lungs: Normal breathing effort, no conversational dyspnea. Extremities: Moves * 4 Neurologic: A and O * 3, good insight  DIAGNOSTIC DATA REVIEWED: BMET    Component Value Date/Time   NA 140 07/04/2023 0943   K 3.9 07/04/2023 0943   CL 102 07/04/2023 0943   CO2 24 07/04/2023 0943   GLUCOSE 98 07/04/2023 0943   GLUCOSE 72 05/23/2023 1443   BUN 15 07/04/2023 0943   CREATININE 1.00 07/04/2023 0943   CREATININE 1.00 05/23/2023 1443   CALCIUM  9.6 07/04/2023 0943   GFRNONAA 64 04/15/2020 1121   GFRNONAA 87 06/13/2016 1436   GFRAA 74 04/15/2020 1121   GFRAA >89 06/13/2016 1436   Lab Results  Component Value Date   HGBA1C 5.8 (H) 06/06/2023   HGBA1C 6.2 (H) 02/11/2009   Lab Results  Component Value Date   INSULIN  27.6 (H) 02/28/2023  INSULIN  23.1 08/03/2022   Lab Results  Component Value Date   TSH 1.54 01/11/2023   CBC    Component Value Date/Time   WBC 6.5 07/04/2023 0943   WBC 6.8 05/23/2023 1443   RBC 4.66 07/04/2023 0943   RBC 4.72 05/23/2023 1443   HGB 12.8 07/04/2023 0943   HCT 40.1 07/04/2023 0943   PLT 371 07/04/2023 0943   MCV 86 07/04/2023 0943   MCH 27.5 07/04/2023 0943   MCH 26.9 (L) 05/23/2023 1443   MCHC 31.9 07/04/2023 0943   MCHC 31.4 (L) 05/23/2023 1443   RDW 13.1 07/04/2023 0943   Iron Studies    Component Value Date/Time   IRON 114 05/18/2008 0405   TIBC 300 05/18/2008 0405   FERRITIN 19 05/18/2008 0405   IRONPCTSAT 38 05/18/2008 0405   Lipid Panel     Component Value Date/Time   CHOL 153 02/28/2023 1110   TRIG 60 02/28/2023 1110   HDL 54 02/28/2023 1110   CHOLHDL 3.1 04/09/2022 1106   CHOLHDL 4.4 06/13/2016 1436   VLDL 30 06/13/2016 1436   LDLCALC 87 02/28/2023 1110   Hepatic Function Panel     Component Value Date/Time    PROT 7.5 07/04/2023 0943   ALBUMIN 4.1 07/04/2023 0943   AST 17 07/04/2023 0943   ALT 15 07/04/2023 0943   ALKPHOS 63 07/04/2023 0943   BILITOT 0.5 07/04/2023 0943   BILIDIR 3.7 (H) 07/28/2012 1200   IBILI 1.7 (H) 07/28/2012 1200      Component Value Date/Time   TSH 1.54 01/11/2023 1057   Nutritional Lab Results  Component Value Date   VD25OH 65.8 06/06/2023   VD25OH 34 01/11/2023    Attestations:   I, Special Puri, acting as a Stage manager for [employer], DO., have compiled all relevant documentation for today's office visit on behalf of Marceil Sensor, DO, while in the presence of [employer], DO.  I have reviewed the above documentation for accuracy and completeness, and I agree with the above. Veronica [name], D.O.  The 21st Century Cures Act was signed into law in 2016 which includes the topic of electronic health records.  This provides immediate access to information in MyChart.  This includes consultation notes, operative notes, office notes, lab results and pathology reports.  If you have any questions about what you read please let us  know at your next visit so we can discuss your concerns and take corrective action if need be.  We are right here with you.

## 2023-08-30 ENCOUNTER — Telehealth: Payer: Self-pay | Admitting: Family Medicine

## 2023-08-30 ENCOUNTER — Other Ambulatory Visit: Payer: Self-pay | Admitting: *Deleted

## 2023-08-30 ENCOUNTER — Encounter: Payer: Self-pay | Admitting: Family Medicine

## 2023-08-30 DIAGNOSIS — I10 Essential (primary) hypertension: Secondary | ICD-10-CM

## 2023-08-30 MED ORDER — SPIRONOLACTONE 25 MG PO TABS: 25.0000 mg | ORAL_TABLET | Freq: Every day | ORAL | 3 refills | Status: AC

## 2023-08-30 NOTE — Telephone Encounter (Signed)
 Attempted to call patient. Reached voicemail, left generic voicemail to call back.   Otho Blitz, MD  Family Medicine Teaching Service   - No signs of cancer--good news! - No pneumonia - does have signs of excess cholesterol--I recommend increasing her statin - She has mild changes in the lower lobes her of lungs--this ca be due to many causes, I recommend Pulmonary follow up  Otho Blitz, MD  [facility] Medicine Teaching Service

## 2023-09-18 ENCOUNTER — Ambulatory Visit: Admitting: Licensed Clinical Social Worker

## 2023-09-24 NOTE — Progress Notes (Unsigned)
 SUBJECTIVE: Discussed the use of AI scribe software for clinical note transcription with the patient, who gave verbal consent to proceed.  Chief Complaint: Obesity  Interim History: She is down 3 lbs since last visit.   Veronica [name] is here to discuss her progress with her obesity treatment plan. She is on the Category 1 Plan and states she is following her eating plan approximately 50 % of the time. She states she is exercising walking 30 minutes 7 times per week.  The patient is a 63 year old with obesity, prediabetes, and hypertension who presents for follow-up on her obesity treatment plan.  She has a history of prediabetes, vitamin D  deficiency, hypertension, and seasonal allergies. She is not taking metformin  for prediabetes, as she reports insurance did not approve. We will try to reorder metformin  today. She is on ergocalciferol  50,000 units once weekly for vitamin D  deficiency.  She has lost three pounds since her last visit and follows a category one diet plan approximately 50% of the time. She engages in physical activity by walking for 30 minutes seven days per week. She tends to snack on chips at work due to boredom but is considering healthier alternatives like yogurt, apples with peanut butter, and granola.Provided 100 calorie snack options hand out today.   She is on losartan  100 mg daily, indapamide  2.5 mg daily, and Aldactone  25 mg daily for hypertension. She is also on Lipitor 10 mg daily for hyperlipidemia.  She describes her stress level as manageable but mentions job-related stress due to her employer's delayed payments, which she believes is causing her headaches. She also experienced poor sleep during a recent trip to [place] , attributing it to the different environment. OBJECTIVE: Visit Diagnoses: Problem List Items Addressed This Visit     Prediabetes - Primary   Relevant Medications   metFORMIN  (GLUCOPHAGE ) 500 MG tablet   Vitamin D  deficiency   Relevant Medications    Vitamin D , Ergocalciferol , (DRISDOL ) 1.25 MG (50000 UNIT) CAPS capsule   Essential hypertension, benign (Chronic)   Other Visit Diagnoses       Stress         Obesity with starting BMI of 40.5       Relevant Medications   metFORMIN  (GLUCOPHAGE ) 500 MG tablet     BMI 39.0-39.9,adult Current BMI 39.9         Obesity Obesity management is ongoing with a 4-pound weight loss since the last visit. She adheres to a category one diet plan approximately 50% of the time and walks for 30 minutes daily. She experiences cravings for snacks, particularly at work, and is seeking healthier alternatives to chips. - Continue category one diet plan - Encourage daily walking for 30 minutes - Advise on healthier snack alternatives such as yogurt, apples with peanut butter, and granola - Provided 100 calorie snacks hand out - Encourage bringing personal snacks to work to avoid unhealthy options  Prediabetes Prediabetes management is ongoing. She has not started metformin  due to insurance issues but is focusing on diet and exercise. Metformin  prescription will be resubmitted to see if insurance will cover it.  Lab Results  Component Value Date   HGBA1C 5.8 (H) 06/06/2023   HGBA1C 6.2 (H) 02/28/2023   HGBA1C 5.7 08/03/2022   Lab Results  Component Value Date   MICROALBUR 1.27 11/28/2009   LDLCALC 87 02/28/2023   CREATININE 1.00 07/04/2023   INSULIN   Date Value Ref Range Status  02/28/2023 27.6 (H) 2.6 - 24.9 uIU/mL Final  ]Discussed  the importance of taking metformin  with food to avoid gastrointestinal side effects such as diarrhea. Metformin  is considered beneficial for prediabetes and has other potential uses, including antiaging. - Resubmit metformin  prescription - Continue working on nutrition plan to decrease simple carbohydrates, increase lean proteins and exercise to promote weight loss, improve glycemic control and prevent progression to Type 2 diabetes.     Hypertension Hypertension  well controlled, asymptomatic, and no significant medication side effects noted.  Medication(s): losartan  100 mg daily, indapamide  2.5 mg daily, and spironolactone  25 mg daily.  BP Readings from Last 3 Encounters:  09/25/23 129/74  08/29/23 108/71  08/14/23 126/75   Lab Results  Component Value Date   CREATININE 1.00 07/04/2023   CREATININE 1.00 05/23/2023   CREATININE 0.88 04/12/2023   No results found for: "GFR"  Plan: Continue all antihypertensives at current dosages. Continue to work on nutrition plan to promote weight loss and improve BP control.    Vitamin D  Deficiency Vitamin D  is at goal of 50.  Most recent vitamin D  level was 65.8. She is on  prescription ergocalciferol  50,000 IU weekly. No N/V or muscle weakness with Ergocalciferol .  Lab Results  Component Value Date   VD25OH 65.8 06/06/2023   VD25OH 34 01/11/2023    Plan: Continue and refill  prescription ergocalciferol  50,000 IU weekly Low vitamin D  levels can be associated with adiposity and may result in leptin resistance and weight gain. Also associated with fatigue.  Currently on vitamin D  supplementation without any adverse effects such as nausea, vomiting or muscle weakness.    Stress/Goals of Care Her father is not doing well, and the family has been advised not to resuscitate him if his condition worsens, based on their understanding of his prognosis and values regarding life-sustaining treatments.    Vitals Temp: 98.7 F (37.1 C) BP: 129/74 Pulse Rate: 71 SpO2: 99 %   Anthropometric Measurements Height: 5\' 4"  (1.626 m) Weight: 232 lb (105.2 kg) BMI (Calculated): 39.8 Weight at Last Visit: 235 lb Weight Lost Since Last Visit: 3 lb Weight Gained Since Last Visit: 0 Starting Weight: 236 lb Total Weight Loss (lbs): 4 lb (1.814 kg) Peak Weight: 236 lb   Body Composition  Body Fat %: 47.3 % Fat Mass (lbs): 109.8 lbs Muscle Mass (lbs): 116.2 lbs Total Body Water (lbs): 78.4 lbs Visceral  Fat Rating : 15   Other Clinical Data Fasting: No Labs: No Today's Visit #: 11 Starting Date: 02/28/23     ASSESSMENT AND PLAN:  Diet: Veronica [name] is currently in the action stage of change. As such, her goal is to continue with weight loss efforts. She has agreed to Category 1 Plan.  Exercise: Veronica [name] has been instructed to work up to a goal of 150 minutes of combined cardio and strengthening exercise per week, to try a geriatric exercise plan, and to continue exercising as is for weight loss and overall health benefits.   Behavior Modification:  We discussed the following Behavioral Modification Strategies today: increasing lean protein intake, decreasing simple carbohydrates, increasing vegetables, increase H2O intake, increase high fiber foods, no skipping meals, better snacking choices, avoiding temptations, and planning for success. We discussed various medication options to help Veronica [name] with her weight loss efforts and we both agreed to continue current treatment plan, continue to work on nutritional and behavioral strategies to promote weight loss.  .  Return in about 3 weeks (around 10/16/2023).Veronica [name] She was informed of the importance of frequent follow up visits to maximize her success  with intensive lifestyle modifications for her multiple health conditions.  Attestation Statements:   Reviewed by clinician on day of visit: allergies, medications, problem list, medical history, surgical history, family history, social history, and previous encounter notes.   Time spent on visit including pre-visit chart review and post-visit care and charting was 27 minutes.    Veronica Brose, PA-C

## 2023-09-25 ENCOUNTER — Encounter (INDEPENDENT_AMBULATORY_CARE_PROVIDER_SITE_OTHER): Payer: Self-pay

## 2023-09-25 ENCOUNTER — Encounter (INDEPENDENT_AMBULATORY_CARE_PROVIDER_SITE_OTHER): Payer: Self-pay | Admitting: Physician Assistant

## 2023-09-25 ENCOUNTER — Ambulatory Visit: Admitting: Licensed Clinical Social Worker

## 2023-09-25 ENCOUNTER — Ambulatory Visit (INDEPENDENT_AMBULATORY_CARE_PROVIDER_SITE_OTHER): Admitting: Physician Assistant

## 2023-09-25 VITALS — BP 129/74 | HR 71 | Temp 98.7°F | Ht 64.0 in | Wt 232.0 lb

## 2023-09-25 DIAGNOSIS — E559 Vitamin D deficiency, unspecified: Secondary | ICD-10-CM | POA: Diagnosis not present

## 2023-09-25 DIAGNOSIS — I10 Essential (primary) hypertension: Secondary | ICD-10-CM | POA: Diagnosis not present

## 2023-09-25 DIAGNOSIS — E669 Obesity, unspecified: Secondary | ICD-10-CM

## 2023-09-25 DIAGNOSIS — F439 Reaction to severe stress, unspecified: Secondary | ICD-10-CM | POA: Diagnosis not present

## 2023-09-25 DIAGNOSIS — R7303 Prediabetes: Secondary | ICD-10-CM | POA: Diagnosis not present

## 2023-09-25 DIAGNOSIS — J302 Other seasonal allergic rhinitis: Secondary | ICD-10-CM

## 2023-09-25 DIAGNOSIS — Z6839 Body mass index (BMI) 39.0-39.9, adult: Secondary | ICD-10-CM

## 2023-09-25 MED ORDER — METFORMIN HCL 500 MG PO TABS
500.0000 mg | ORAL_TABLET | Freq: Every day | ORAL | 0 refills | Status: DC
Start: 2023-09-25 — End: 2023-10-29

## 2023-09-25 MED ORDER — VITAMIN D (ERGOCALCIFEROL) 1.25 MG (50000 UNIT) PO CAPS
50000.0000 [IU] | ORAL_CAPSULE | ORAL | 0 refills | Status: DC
Start: 2023-09-25 — End: 2023-10-29

## 2023-10-01 ENCOUNTER — Ambulatory Visit: Admitting: Internal Medicine

## 2023-10-01 ENCOUNTER — Encounter: Payer: Self-pay | Admitting: Internal Medicine

## 2023-10-08 ENCOUNTER — Encounter (INDEPENDENT_AMBULATORY_CARE_PROVIDER_SITE_OTHER): Payer: Self-pay

## 2023-10-09 ENCOUNTER — Ambulatory Visit (INDEPENDENT_AMBULATORY_CARE_PROVIDER_SITE_OTHER): Admitting: Physician Assistant

## 2023-10-14 ENCOUNTER — Other Ambulatory Visit: Payer: Self-pay

## 2023-10-15 MED ORDER — FLUOXETINE HCL 20 MG PO CAPS: 20.0000 mg | ORAL_CAPSULE | Freq: Every day | ORAL | 3 refills | Status: AC

## 2023-10-22 ENCOUNTER — Encounter (INDEPENDENT_AMBULATORY_CARE_PROVIDER_SITE_OTHER): Payer: Self-pay

## 2023-10-23 ENCOUNTER — Ambulatory Visit: Admitting: Licensed Clinical Social Worker

## 2023-10-24 ENCOUNTER — Ambulatory Visit (INDEPENDENT_AMBULATORY_CARE_PROVIDER_SITE_OTHER): Admitting: Family Medicine

## 2023-10-24 NOTE — Progress Notes (Incomplete)
 Barnie DOROTHA Jenkins, DO, ABFM, ABOM Bariatric physician [address] [place], [place], [place] [address] Office: [phone]  /  Fax: [phone]     Initial Evaluation:  Veronica [name] was seen in clinic today to evaluate for obesity. She is interested in losing weight to improve overall health and reduce the risk of weight related complications. She presents today to review program treatment options, initial physical assessment, and evaluation.      She was referred by: {emreferby:28303}  When asked how has your weight affected you? She states: {EMWeightAffected:28305}  Contributing factors to her weight change: {EMcontributingfactors:28307}  Some associated conditions: {EMSomeConditions:28306}  Current nutrition plan: {EMNutritionplan:28309::None}  Current level of physical activity: {EMcurrentPA:28310::None}  Current or previous pharmacotherapy: {EM previousRx:28311}  Response to medication: {EMResponsetomedication:28312}   Barriers to weight loss that patient expresses a concern about today: {EMOBESITYBARRIERS:28841::none}.   Past Medical History:  Diagnosis Date   Anxiety    Bilateral swelling of feet    Chest pain    Chewing difficulty    Constipation    Daily headache    Depression    Diverticulitis    Fibromyalgia    G6PD deficiency    outside labs--low level, no longer on HCQ   Glucose 6 phosphatase deficiency (HCC)    High cholesterol    History of cholelithiasis    History of colon polyps    Hypertension    Joint pain    Mini stroke    Osteoarthritis    Prediabetes    Rheumatoid arthritis (HCC)    SLE (systemic lupus erythematosus) (HCC) 1998   SOB (shortness of breath)    Swallowing difficulty     Current Outpatient Medications  Medication Instructions   aspirin  EC 81 mg, Oral, Daily, Swallow whole.   atorvastatin  (LIPITOR) 10 mg, Oral, Daily   cetirizine  (ZYRTEC ) 10 mg, Oral, Daily   cyclobenzaprine  (FLEXERIL ) 5 mg, Oral, 3 times daily  PRN   FLUoxetine  (PROZAC ) 20 mg, Oral, Daily   hydroxychloroquine  (PLAQUENIL ) 200 mg, Oral, 2 times daily   indapamide  (LOZOL ) 2.5 mg, Oral, Daily   losartan  (COZAAR ) 100 mg, Oral, Daily   metFORMIN  (GLUCOPHAGE ) 500 mg, Oral, Daily   nystatin  (MYCOSTATIN /NYSTOP ) powder 1 Application, Topical, 3 times daily   pantoprazole  (PROTONIX ) 40 mg, Oral, Daily   polyethylene glycol powder (GLYCOLAX /MIRALAX ) 17 g, Oral, 2 times daily PRN   Prenatal Vit-Fe Fumarate-FA (PRENATAL VITAMINS PO) Take by mouth.   spironolactone  (ALDACTONE ) 25 mg, Oral, Daily   topiramate  (TOPAMAX ) 50 mg, Oral, Daily at bedtime   Vitamin D  (Ergocalciferol ) (DRISDOL ) 50,000 Units, Oral, Every 7 days     Allergies  Allergen Reactions   Amlodipine  Swelling    Leg edema   Tessalon  [Benzonatate ] Other (See Comments)    Lip swelling     Past Surgical History:  Procedure Laterality Date   ABDOMINAL HYSTERECTOMY  09/2008   CESAREAN SECTION  1992, 1995   CHOLECYSTECTOMY N/A 07/18/2012   Procedure: LAPAROSCOPIC CHOLECYSTECTOMY WITH INTRAOPERATIVE CHOLANGIOGRAM;  Surgeon: Camellia CHRISTELLA Blush, MD;  Location: [facility] OR;  Service: General;  Laterality: N/A;   COLONOSCOPY     ERCP N/A 07/29/2012   Procedure: ENDOSCOPIC RETROGRADE CHOLANGIOPANCREATOGRAPHY (ERCP);  Surgeon: Belvie JONETTA Just, MD;  Location: [facility];  Service: Endoscopy;  Laterality: N/A;  Dr. Just said he would start this PT arond 1330( AW)   INCISION AND DRAINAGE ABSCESS Right 12/02/2015   Procedure: INCISION AND DRAINAGE ABSCESS;  Surgeon: Lynwood Pina, MD;  Location: [facility] OR;  Service: General;  Laterality: Right;  Right axillary abscess   TUBAL LIGATION  1995     Family History  Problem Relation Age of Onset   Hypertension Mother    Diabetes Mother    Bladder Cancer Mother    Sleep apnea Mother    Hypertension Father    Kidney disease Father    Diabetes Sister    Multiple sclerosis Sister    Diabetes Brother    Hypertension Brother    Healthy Son    Healthy  Son      Objective:  There were no vitals taken for this visit. She was weighed on the bioimpedance scale: There is no height or weight on file to calculate BMI.  Visceral Fat %:***, Body Fat %:***  No data recorded No data recorded ***  No data recorded No data recorded No data recorded No data recorded   General: Well Developed, well nourished, and in no acute distress.  HEENT: Normocephalic, atraumatic; EOMI, sclerae are anicteric. Skin: Warm and dry, good turgor Chest:  Normal excursion, shape, no gross ABN Respiratory: No conversational dyspnea; speaking in full sentences NeuroM-Sk:  Normal gross ROM * 4 extremities  Psych: A and O *3, insight adequate, mood- full    Assessment and Plan:   FOR THE DISEASE OF OBESITY:  There are no diagnoses linked to this encounter. ***add obesity and bmi dx here only  We reviewed anthropometrics, biometrics, associated medical conditions and contributing factors with patient. Veronica [name] would benefit from a medically tailored reduced calorie nutrional plan based on their REE (resting energy expenditure), which will be determined by indirect calorimetry.  We will also assess for cardiometabolic risk and nutritional derangements via fasting labs at intake appointment.    Obesity Treatment / Action Plan:   she was weighed on the bioimpedance scale and results were discussed and documented in the synopsis.   Veronica [name] will complete provided nutritional and psychosocial assessment questionnaire before the next appointment.  she will be scheduled for indirect calorimetry to determine resting energy expenditure in a fasting state.  This will allow us  to create a reduced calorie, high-protein meal plan to promote loss of fat mass while preserving muscle mass.  We will also assess for cardiometabolic risk and nutritional derangements via an ECG and fasting serologies at her next appointment.  she was encouraged to work on amassing support  from family and friends to begin their weight loss journey.   Work on eliminating or reducing the presence of highly processed, poorly nutritious, calorie-dense foods in the home.   Obesity Education Performed Today:  Patient was counseled on nutritional approaches to weight loss and benefits of reducing processed foods and consuming plant-based foods and high quality protein as part of nutritional weight management program.   We discussed the importance of long term lifestyle changes which include nutrition, exercise and behavioral modifications as well as the importance of customizing this to her specific health and social needs.   We discussed the benefits of reaching a healthier weight to alleviate the symptoms of existing conditions and reduce the risks of the biomechanical, metabolic and psychological effects of obesity.  Was counseled on the health benefits of losing 5%-10% of total body weight.  Was counseled on our cognitive behavorial therapy program, lead by our bariatric psychologist, who focuses on emotional eating and creating positive behavorial change.  Was counseled on bariatric pharmacotherapy and how this may be used as an adjunct in their weight management    Veronica [name] appears to  be in the action stage of change and states they are ready to start intensive lifestyle modifications and behavioral modifications.  It was recommended that she follow up in the next 1-2 weeks to review the above steps, and to continue with treatment of their chronic disease state of obesity   FOR OTHER CONDITIONS RELATED TO THE DISEASE OF OBESITY:  There are no diagnoses linked to this encounter.   Attestations:   I, ***, acting as a medical scribe for Barnie Jenkins, DO., have compiled all relevant documentation for today's office visit on behalf of Barnie Jenkins, DO, while in the presence of [employer], DO.  I have spent *** minutes in the care of the patient today.  *** minutes  was spent in face to face counseling of the patient on the disease of obesity and what our program can do for their medical conditions as well as in preventing future diseases. I discussed the importance of comprehensive care in the treatment of obesity including mental well being and physical activity. *** minutes was spent on pre-chart review and additional post visit documentation.   I have reviewed the above documentation for accuracy and completeness, and I agree with the above. Barnie JINNY Jenkins, D.O.  The 21st Century Cures Act was signed into law in 2016 which includes the topic of electronic health records.  This provides immediate access to information in MyChart.  This includes consultation notes, operative notes, office notes, lab results and pathology reports.  If you have any questions about what you read please let us  know at your next visit so we can discuss your concerns and take corrective action if need be.  We are right here with you!

## 2023-10-28 NOTE — Progress Notes (Unsigned)
 NEUROLOGY FOLLOW UP OFFICE NOTE  Veronica [name] [id number]  Assessment/Plan:   Tension-type headache, not intractable  Hypertension History of TIA Paroxysmal extremity pain.  Unclear etiology.  Not consistent with a polyneuropathy, radiculopathy or myelopathy, or intracranial abnormality.  Not a typical side effect of topiramate .     1.Topiramate  50mg  at bedtime  2. Secondary stroke prevention as managed by PCP: - ASA 81mg  daily - Statin.  LDL goal less than 70 - Normotensive blood pressure - Hgb A1c goal less than 7 4. Monitor the paroxysmal pain.  If gets worse, contact me 5.  Otherwise, Follow up one year   Subjective:  Veronica [name] is a 63 year old right-handed woman with SLE, hypertension, and allergic rhinitis who follows up for TIA and headache.   UPDATE: Current medications for secondary stroke prevention:  ASA 81mg , atorvastatin  10mg , losartan   Headaches stable.  Occur once in awhile.     Current NSAIDS:  Naproxen  500mg   Current analgesics:  Tylenol  Current triptans:  none Current ergotamine:  none Current anti-emetic:  none Current muscle relaxants:  Flexeril   Current anti-anxiolytic:  none Current sleep aide:  none Current Antihypertensive medications:  Losartan , indapamide , spironolactone  Current Antidepressant medications:  fluoxetine  Current Anticonvulsant medications:  topiramate  50mg  at bedtime Current anti-CGRP:  none Current Vitamins/Herbal/Supplements:  none Current Antihistamines/Decongestants:  Zyrtec  Other therapy:  Vicks vapor rub to forehead Other medication:  Plaquenil    Caffeine:  No coffee.  Sometimes Coke Diet:  Drinks 3 bottles of 16.9 oz water daily.  1 can of Fanta Orange or [employer] Exercise:  no Depression:  no; Anxiety:  Increased work-related anxiety Other pain:  no Sleep hygiene:  Poor.  Diagnosed with mild OSA in 2019. Instructed to lose weight and follow up.  She reports having lost weight but sleep has not improved.      HISTORY:  She was seen in the ED at [facility] on 12/05/2019 for left sided facial droop and slurred speech.  She was on the phone when she started feeling lightheaded and noted slurred speech.  She called 911 and when EMS arrived, they noticed subtle left sided facial droop.  She had an associated severe pounding frontal headache.  No unilateral numbness or weakness of extremities. She presented as a code stroke.  Blood pressure initially 130 systolic but later 170.  No objective neurologic signs on exam in the ED (NIHSS 0).  CT and MRI of brain personally reviewed were unremarkable.  tPA was not administered due to resolution of symptoms.  She was discharged on ASA 81mg  daily.  Since then, she has had a daily headache.  She also has feeling of heat in her body and has increased nervousness and shakiness.  She feels off balance.  When she closes her eyes, she sees darkness that is moving quickly across her visual field.  Due to increased anxiety, she was started on Prozac .  Lipitor was increased from 10mg  to 40mg  daily.  CTA head and neck on 01/08/2020 showed no large vessel stenosis or occlusion.  In December 2021, she has had episodes of black outs in which she is standing and suddenly feels off balance, possibly loss of awareness for a second or so.  She also has been stuttering or have slurred speech.  Orthostatic vitals reportedly normal.  She was told that it could have been dehydration so she started increasing water intake and it has resolved.  05/24/2020 2D echocardiogram:  EF 60-65% with no cardiac source of emboli.  12 day Cardiac event monitor:  No afib or significant heart block  In 2023, she reported a mild left temporal throbbing headache.  Usually lasts a minute or two.  Occurs maybe once a week.  No associated nausea, vomiting, photophobia, phonophobia, osmophobia, visual disturbance, autonomic symptoms or numbness/weakness.   Started experiencing intermittent electric shock  sensation in the right shoulder, arm, leg or feet.  It is non-radiating.  It lasts for a second or two.  Not with any particular activity or position.  No neck pain.  No associated weakness or numbness.  Does not involve the left side.  Labs unremarkable, including B12 380, TSH 1.300, CBC and CMP.     She has history of headaches since 2012-2013, described as moderate bifrontal non-throbbing headache with slight dizziness.   She has a known partially empty sella on MRI of brain with and without contrast from 04/08/15 and MRI of brain without contrast on 07/12/11.  She has her eyes examined annually.  No significant issues.     Labs from July:  Hgb A1c 5.5; LDL 60 Labs from August:  SARS Coronvirus 2 negative; TSH 1.150   Past NSAIDS:  ibuprofen  Past analgesics:  Excedrin Past abortive triptans:  none Past abortive ergotamine:  none Past muscle relaxants:  none Past anti-emetic:  none Past antihypertensive medications:  none Past antidepressant medications:  none Past anticonvulsant medications:  none Past anti-CGRP:  none Past vitamins/Herbal/Supplements:  none Past antihistamines/decongestants:  none Other past therapies:  none     Sister has MS.  PAST MEDICAL HISTORY: Past Medical History:  Diagnosis Date   Anxiety    Bilateral swelling of feet    Chest pain    Chewing difficulty    Constipation    Daily headache    Depression    Diverticulitis    Fibromyalgia    G6PD deficiency    outside labs--low level, no longer on HCQ   Glucose 6 phosphatase deficiency (HCC)    High cholesterol    History of cholelithiasis    History of colon polyps    Hypertension    Joint pain    Mini stroke    Osteoarthritis    Prediabetes    Rheumatoid arthritis (HCC)    SLE (systemic lupus erythematosus) (HCC) 1998   SOB (shortness of breath)    Swallowing difficulty     MEDICATIONS: Current Outpatient Medications on File Prior to Visit  Medication Sig Dispense Refill   aspirin  EC  81 MG tablet Take 1 tablet (81 mg total) by mouth daily. Swallow whole. 30 tablet 11   atorvastatin  (LIPITOR) 10 MG tablet Take 1 tablet (10 mg total) by mouth daily. 90 tablet 3   cetirizine  (ZYRTEC ) 10 MG tablet Take 1 tablet (10 mg total) by mouth daily. 30 tablet 11   cyclobenzaprine  (FLEXERIL ) 5 MG tablet Take 1 tablet (5 mg total) by mouth 3 (three) times daily as needed for muscle spasms. 30 tablet 1   FLUoxetine  (PROZAC ) 20 MG capsule Take 1 capsule (20 mg total) by mouth daily. 90 capsule 3   hydroxychloroquine  (PLAQUENIL ) 200 MG tablet Take 1 tablet by mouth twice daily 180 tablet 0   indapamide  (LOZOL ) 2.5 MG tablet Take 1 tablet (2.5 mg total) by mouth daily. 90 tablet 2   losartan  (COZAAR ) 100 MG tablet Take 1 tablet (100 mg total) by mouth daily. 90 tablet 3   metFORMIN  (GLUCOPHAGE ) 500 MG tablet Take 1 tablet (500 mg total) by mouth daily. 30 tablet  0   nystatin  (MYCOSTATIN /NYSTOP ) powder Apply 1 Application topically 3 (three) times daily. 15 g 3   pantoprazole  (PROTONIX ) 40 MG tablet Take 1 tablet (40 mg total) by mouth daily. 60 tablet 3   polyethylene glycol powder (GLYCOLAX /MIRALAX ) 17 GM/SCOOP powder Take 17 g by mouth 2 (two) times daily as needed. 3350 g 1   Prenatal Vit-Fe Fumarate-FA (PRENATAL VITAMINS PO) Take by mouth.     spironolactone  (ALDACTONE ) 25 MG tablet Take 1 tablet (25 mg total) by mouth daily. 90 tablet 3   topiramate  (TOPAMAX ) 50 MG tablet Take 1 tablet (50 mg total) by mouth at bedtime. 90 tablet 3   Vitamin D , Ergocalciferol , (DRISDOL ) 1.25 MG (50000 UNIT) CAPS capsule Take 1 capsule (50,000 Units total) by mouth every 7 (seven) days. 5 capsule 0   No current facility-administered medications on file prior to visit.    ALLERGIES: Allergies  Allergen Reactions   Amlodipine  Swelling    Leg edema   Tessalon  [Benzonatate ] Other (See Comments)    Lip swelling    FAMILY HISTORY: Family History  Problem Relation Age of Onset   Hypertension Mother     Diabetes Mother    Bladder Cancer Mother    Sleep apnea Mother    Hypertension Father    Kidney disease Father    Diabetes Sister    Multiple sclerosis Sister    Diabetes Brother    Hypertension Brother    Healthy Son    Healthy Son       Objective:  Blood pressure 113/68, pulse 72, height 5' 4 (1.626 m), weight 238 lb (108 kg), SpO2 97%. General: No acute distress.  Patient appears well-groomed.   Head:  Normocephalic/atraumatic Neck:  Supple.  No paraspinal tenderness.  Full range of motion. Heart:  Regular rate and rhythm. Neuro:  Alert and oriented.  Speech fluent and not dysarthric.  Language intact.  CN II-XII intact.  Bulk and tone normal.  Muscle strength 5/5 throughout.  Sensation to pinprick and vibration intact.  Deep tendon reflexes 2+ throughout, toes downgoing.  Gait normal.  Romberg negative.      Juliene Dunnings, DO  CC: Margette Daring, MD

## 2023-10-29 ENCOUNTER — Encounter: Payer: Self-pay | Admitting: Neurology

## 2023-10-29 ENCOUNTER — Ambulatory Visit (INDEPENDENT_AMBULATORY_CARE_PROVIDER_SITE_OTHER): Admitting: Family Medicine

## 2023-10-29 ENCOUNTER — Ambulatory Visit (INDEPENDENT_AMBULATORY_CARE_PROVIDER_SITE_OTHER): Payer: 59 | Admitting: Neurology

## 2023-10-29 ENCOUNTER — Encounter (INDEPENDENT_AMBULATORY_CARE_PROVIDER_SITE_OTHER): Payer: Self-pay | Admitting: Family Medicine

## 2023-10-29 VITALS — BP 106/69 | HR 78 | Temp 98.6°F | Ht 64.0 in | Wt 235.0 lb

## 2023-10-29 VITALS — BP 113/68 | HR 72 | Ht 64.0 in | Wt 238.0 lb

## 2023-10-29 DIAGNOSIS — E559 Vitamin D deficiency, unspecified: Secondary | ICD-10-CM | POA: Diagnosis not present

## 2023-10-29 DIAGNOSIS — G44219 Episodic tension-type headache, not intractable: Secondary | ICD-10-CM | POA: Diagnosis not present

## 2023-10-29 DIAGNOSIS — R7303 Prediabetes: Secondary | ICD-10-CM | POA: Diagnosis not present

## 2023-10-29 DIAGNOSIS — F439 Reaction to severe stress, unspecified: Secondary | ICD-10-CM | POA: Diagnosis not present

## 2023-10-29 DIAGNOSIS — E669 Obesity, unspecified: Secondary | ICD-10-CM

## 2023-10-29 DIAGNOSIS — G479 Sleep disorder, unspecified: Secondary | ICD-10-CM

## 2023-10-29 DIAGNOSIS — Z6841 Body Mass Index (BMI) 40.0 and over, adult: Secondary | ICD-10-CM

## 2023-10-29 DIAGNOSIS — I10 Essential (primary) hypertension: Secondary | ICD-10-CM

## 2023-10-29 DIAGNOSIS — E538 Deficiency of other specified B group vitamins: Secondary | ICD-10-CM

## 2023-10-29 MED ORDER — METFORMIN HCL 500 MG PO TABS
500.0000 mg | ORAL_TABLET | Freq: Every day | ORAL | Status: DC
Start: 2023-10-29 — End: 2023-11-26

## 2023-10-29 MED ORDER — TOPIRAMATE 50 MG PO TABS: 50.0000 mg | ORAL_TABLET | Freq: Every day | ORAL | 3 refills | Status: AC

## 2023-10-29 MED ORDER — VITAMIN D (ERGOCALCIFEROL) 1.25 MG (50000 UNIT) PO CAPS: 50000.0000 [IU] | ORAL_CAPSULE | ORAL | 0 refills | Status: DC

## 2023-10-29 NOTE — Progress Notes (Signed)
 Office Visit Note  Patient: Veronica [name]             Date of Birth: 01/28/1961           MRN: [id number]             PCP: Delores Suzann HERO, MD Referring: Delores Suzann HERO, MD Visit Date: 11/06/2023 Occupation: @GUAROCC @  Subjective:  Left hip pain  History of Present Illness: Veronica [name] is a 63 y.o. female with systemic lupus, osteoarthritis and fibromyalgia syndrome.  She returns today after last visit in January 2025.  She states that her left trochanteric bursa improved after the injection in January.  The pain is returning now.  The pain is manageable currently.  She gives history of fatigue, dry mouth.  There is no history of oral ulcers, nasal ulcers, malar rash, photosensitivity, Raynaud's or lymphadenopathy.  He denies any history of inflammatory arthritis.  She denies any shortness of breath.  She continues to have some generalized pain from fibromyalgia.  She also has some discomfort in her knee joints which is manageable.  Activities of Daily Living:  Patient reports morning stiffness for 5 minutes.   Patient Denies nocturnal pain.  Difficulty dressing/grooming: Denies Difficulty climbing stairs: Reports Difficulty getting out of chair: Reports Difficulty using hands for taps, buttons, cutlery, and/or writing: Denies  Review of Systems  Constitutional:  Positive for fatigue.  HENT:  Positive for mouth dryness. Negative for mouth sores.   Eyes:  Negative for dryness.  Respiratory:  Negative for shortness of breath.   Cardiovascular:  Negative for chest pain and palpitations.  Gastrointestinal:  Negative for blood in stool, constipation and diarrhea.  Endocrine: Positive for increased urination.  Genitourinary:  Negative for involuntary urination.  Musculoskeletal:  Positive for joint pain, gait problem, joint pain and morning stiffness. Negative for joint swelling, myalgias, muscle weakness, muscle tenderness and myalgias.  Skin:  Negative for color change, rash,  hair loss and sensitivity to sunlight.  Allergic/Immunologic: Positive for susceptible to infections.  Neurological:  Negative for dizziness and headaches.  Hematological:  Negative for swollen glands.  Psychiatric/Behavioral:  Positive for depressed mood and sleep disturbance. The patient is not nervous/anxious.     PMFS History:  Patient Active Problem List   Diagnosis Date Noted   Vitamin D  deficiency 03/14/2023   OSA (obstructive sleep apnea) 02/28/2023   Major depressive disorder, recurrent episode, moderate (HCC) 01/08/2023   Class 2 severe obesity due to excess calories with serious comorbidity and body mass index (BMI) of 39.0 to 39.9 in adult [facility]) 12/20/2022   Esophageal dysphagia 04/15/2020   Mood disorder (HCC) 11/13/2019   Atherosclerosis of aorta (HCC) 07/15/2019   Prediabetes 07/13/2019   Chronic abdominal pain 07/13/2019   Generalized anxiety disorder 05/15/2019   Hyperlipidemia 06/14/2015   Insomnia 12/15/2012   Essential hypertension, benign 04/10/2012   SLE (systemic lupus erythematosus) (HCC) 04/10/2012    Past Medical History:  Diagnosis Date   Anxiety    Bilateral swelling of feet    Chest pain    Chewing difficulty    Constipation    Daily headache    Depression    Diverticulitis    Fibromyalgia    G6PD deficiency    outside labs--low level, no longer on HCQ   Glucose 6 phosphatase deficiency (HCC)    High cholesterol    History of cholelithiasis    History of colon polyps    Hypertension    Joint pain  Mini stroke    Osteoarthritis    Prediabetes    Rheumatoid arthritis (HCC)    SLE (systemic lupus erythematosus) (HCC) 1998   SOB (shortness of breath)    Swallowing difficulty     Family History  Problem Relation Age of Onset   Hypertension Mother    Diabetes Mother    Bladder Cancer Mother    Sleep apnea Mother    Hypertension Father    Kidney disease Father    Diabetes Sister    Multiple sclerosis Sister    Diabetes Brother     Hypertension Brother    Healthy Son    Healthy Son    Past Surgical History:  Procedure Laterality Date   ABDOMINAL HYSTERECTOMY  09/2008   CESAREAN SECTION  1992, 1995   CHOLECYSTECTOMY N/A 07/18/2012   Procedure: LAPAROSCOPIC CHOLECYSTECTOMY WITH INTRAOPERATIVE CHOLANGIOGRAM;  Surgeon: Camellia CHRISTELLA Blush, MD;  Location: [facility] OR;  Service: General;  Laterality: N/A;   COLONOSCOPY     ERCP N/A 07/29/2012   Procedure: ENDOSCOPIC RETROGRADE CHOLANGIOPANCREATOGRAPHY (ERCP);  Surgeon: Belvie JONETTA Just, MD;  Location: [facility];  Service: Endoscopy;  Laterality: N/A;  Dr. Just said he would start this PT arond 1330( AW)   INCISION AND DRAINAGE ABSCESS Right 12/02/2015   Procedure: INCISION AND DRAINAGE ABSCESS;  Surgeon: Lynwood Pina, MD;  Location: [facility] OR;  Service: General;  Laterality: Right;  Right axillary abscess   TUBAL LIGATION  1995   Social History   Social History Narrative   Pt lives with spouse. Some college.    Works 7 days on then 7 days off at place of work       Patient is right-handed. She lives with her husband in a one level home. She occasionally drinks soda. She does not exercise.    Immunization History  Administered Date(s) Administered   Influenza, Seasonal, Injecte, Preservative Fre 04/12/2023   Influenza,inj,Quad PF,6+ Mos 01/09/2013, 02/21/2015, 02/07/2016, 01/09/2017, 02/18/2018, 01/07/2019, 01/25/2020, 01/06/2021, 01/16/2022   Influenza-Unspecified 01/09/2013, 02/21/2015, 02/07/2016, 01/09/2017, 02/18/2018   PFIZER(Purple Top)SARS-COV-2 Vaccination 05/11/2019, 06/05/2019, 02/13/2020   PNEUMOCOCCAL CONJUGATE-20 01/30/2021   PPD Test 09/02/2012, 11/03/2013, 10/08/2014, 11/28/2015, 02/25/2017, 02/18/2018, 01/07/2019, 01/25/2020, 01/30/2021, 04/09/2022, 08/05/2023   Pfizer Covid-19 Vaccine Bivalent Booster 82yrs & up 05/22/2021   Pfizer(Comirnaty)Fall Seasonal Vaccine 12 years and older 04/09/2022, 04/12/2023   Tdap 09/02/2012     Objective: Vital Signs: BP 110/74  (BP Location: Left Arm, Patient Position: Sitting, Cuff Size: Normal)   Pulse 67   Resp 16   Ht 5' 4 (1.626 m)   Wt 238 lb (108 kg)   BMI 40.85 kg/m    Physical Exam Vitals and nursing note reviewed.  Constitutional:      Appearance: She is well-developed.  HENT:     Head: Normocephalic and atraumatic.  Eyes:     Conjunctiva/sclera: Conjunctivae normal.  Cardiovascular:     Rate and Rhythm: Normal rate and regular rhythm.     Heart sounds: Normal heart sounds.  Pulmonary:     Effort: Pulmonary effort is normal.     Breath sounds: Normal breath sounds.  Abdominal:     General: Bowel sounds are normal.     Palpations: Abdomen is soft.  Musculoskeletal:     Cervical back: Normal range of motion.  Lymphadenopathy:     Cervical: No cervical adenopathy.  Skin:    General: Skin is warm and dry.     Capillary Refill: Capillary refill takes less than 2 seconds.  Neurological:  Mental Status: She is alert and oriented to person, place, and time.  Psychiatric:        Behavior: Behavior normal.      Musculoskeletal Exam: Cervical spine with good range of motion.  She had no discomfort range of motion of her thoracic lumbar spine.  Shoulders, elbows, wrist, MCPs PIPs and DIPs were in good range of motion without any synovitis.  Hip joints and knee joints in good range of motion.  She had mild tenderness over left trochanteric region.  There was no tenderness over ankles or MTPs.  CDAI Exam: CDAI Score: -- Patient Global: --; Provider Global: -- Swollen: --; Tender: -- Joint Exam 11/06/2023   No joint exam has been documented for this visit   There is currently no information documented on the homunculus. Go to the Rheumatology activity and complete the homunculus joint exam.  Investigation: No additional findings.  Imaging: No results found.  Recent Labs: Lab Results  Component Value Date   WBC 6.5 07/04/2023   HGB 12.8 07/04/2023   PLT 371 07/04/2023   NA 140  07/04/2023   K 3.9 07/04/2023   CL 102 07/04/2023   CO2 24 07/04/2023   GLUCOSE 98 07/04/2023   BUN 15 07/04/2023   CREATININE 1.00 07/04/2023   BILITOT 0.5 07/04/2023   ALKPHOS 63 07/04/2023   AST 17 07/04/2023   ALT 15 07/04/2023   PROT 7.5 07/04/2023   ALBUMIN 4.1 07/04/2023   CALCIUM  9.6 07/04/2023   GFRAA 74 04/15/2020    Speciality Comments: PLQ Eye Exam: 02/20/2023 WNL @ [facility] Follow up in 1 year  Plaquenil  since 2000, methotrexate -GI side effects and fatigue  Procedures:  No procedures performed Allergies: Amlodipine  and Tessalon  [benzonatate ]   Assessment / Plan:     Visit Diagnoses: Other systemic lupus erythematosus with other organ involvement (HCC) - + ANA, +Ro, + RNP (previously +dsDNA and +RF)h/o arthritis, malar rash, pleural effusion.dxd 1998 Dr. Everlean later Dr. Leni and Mr. Lenor. -She continues to do well on hydroxychloroquine  200 mg twice daily.  She gives history of fatigue and dry mouth.  There is no history of oral ulcers, nasal ulcers, malar rash, Ro sensitivity, Raynaud's or lymphadenopathy.  No inflammatory arthritis was noted.  I will recheck labs today.  Plan: Protein / creatinine ratio, urine, ANA, Anti-DNA antibody, double-stranded, C3 and C4, Sedimentation rate  High risk medication use - Plaquenil  200 mg p.o. twice daily since 2000.  Plaquenil  eye examination February 20, 2023 at [facility].  Methotrexate -fatigue and GI side effects - Plan: CBC with Differential/Platelet, Comprehensive metabolic panel with GFR  G6PD deficiency  Pleuritis - 1998 and 2008 treated with prednisone  for 3 years in 1998 by Dr. Everlean  Primary osteoarthritis of both hands-joint protection muscle strengthening was discussed.  No synovitis was noted.  Carpal tunnel syndrome, right upper limb - Status post left carpal tunnel release.  Trochanteric bursitis, left hip-she had good response to cortisone injection given in January 2025.  She states the  pain is coming back now.  A handout on IT band stretches was given.  Chronic pain of both knees-doing better currently.  Pain in both feet -proper fitting shoes were advised.  Previous x-rays showed osteoarthritic changes.  Fibromyalgia-she continues to have some generalized pain and discomfort from fibromyalgia.  She takes Prozac  and Flexeril .  Need for regular exercise and stretching was discussed.  Other fatigue-related to insomnia and fibromyalgia.  Other insomnia-good sleep hygiene was discussed.  Elevated  CK - CK is mildly elevated, aldolase normal, myositis panel negative, HMG CR antibody negative  Prediabetes  Essential hypertension, benign-blood pressure was normal at 110/74.  Other medical problems listed as follows:  Dyslipidemia-she is on Lipitor 10 mg p.o. daily.  Dietary modifications and exercise was discussed.  Increased risk of heart disease with autoimmune disease was discussed.  Esophageal dysphagia  Mood disorder (HCC)  Vitamin D  deficiency-vitamin D  was 65.4 on October 29, 2023.  Family history of multiple sclerosis-Sister  Atherosclerosis of aorta (HCC)  Orders: Orders Placed This Encounter  Procedures   Protein / creatinine ratio, urine   CBC with Differential/Platelet   Comprehensive metabolic panel with GFR   ANA   Anti-DNA antibody, double-stranded   C3 and C4   Sedimentation rate   No orders of the defined types were placed in this encounter.    Follow-Up Instructions: Return in about 5 months (around 04/07/2024) for Systemic lupus, Osteoarthritis.   Maya Nash, MD  Note - This record has been created using Animal nutritionist.  Chart creation errors have been sought, but may not always  have been located. Such creation errors do not reflect on  the standard of medical care.

## 2023-10-29 NOTE — Progress Notes (Signed)
 Veronica [name], D.O.  ABFM, ABOM Specializing in Clinical Bariatric Medicine  Office located at: [address] [place], [place]  [address]   Assessment and Plan:   Orders Placed This Encounter  Procedures   VITAMIN D  25 Hydroxy (Vit-D Deficiency, Fractures)   Hemoglobin A1c   Vitamin B12    Medications Discontinued During This Encounter  Medication Reason   Vitamin D , Ergocalciferol , (DRISDOL ) 1.25 MG (50000 UNIT) CAPS capsule Reorder   metFORMIN  (GLUCOPHAGE ) 500 MG tablet Reorder     Meds ordered this encounter  Medications   Vitamin D , Ergocalciferol , (DRISDOL ) 1.25 MG (50000 UNIT) CAPS capsule    Sig: Take 1 capsule (50,000 Units total) by mouth every 7 (seven) days.    Dispense:  5 capsule    Refill:  0   metFORMIN  (GLUCOPHAGE ) 500 MG tablet    Sig: Take 1 tablet (500 mg total) by mouth daily. Start the med     Labs ordered today (A1c, vit D, vit B12) will be obtained at next OV.    FOR THE DISEASE OF OBESITY:  Assessment & Plan: Since last office visit on 5/28 patient's muscle mass has increased by 1 lb. Fat mass has increased by 2 lbs. Total body water has increased by 3 lbs.  Counseling done on how various foods will affect these numbers and how to maximize success  Total lbs lost to date: 1 lb Total weight loss percentage to date: -0.42%    Recommended Dietary Goals Saesha is currently in the action stage of change. As such, her goal is to continue weight management plan.  She has agreed to: continue current plan   Behavioral Intervention We discussed the following today: increasing lean protein intake to established goals, decreasing simple carbohydrates , avoiding skipping meals, increasing water intake , decreasing eating out or consumption of processed foods, and making healthy choices when eating convenient foods, and work on managing stress, creating time for self-care and relaxation  Additional resources provided today:  Handout on 4-7-8  Breathing Exercise and Handout for CAT 1 MP  Evidence-based interventions for health behavior change were utilized today including the discussion of self monitoring techniques, problem-solving barriers and SMART goal setting techniques.   Regarding patient's less desirable eating habits and patterns, we employed the technique of small changes.   Pt will specifically work on: aim for 7-9 hours of sleep and try the 4-7-8 breathing method   Recommended Physical Activity Goals Lillionna has been advised to work up to 300-450 minutes of moderate intensity aerobic activity a week and strengthening exercises 2-3 times per week for cardiovascular health, weight loss maintenance and preservation of muscle mass.   She has agreed to: Continue current level of physical activity  and Increase the intensity, frequency or duration of aerobic exercises     Pharmacotherapy We both agreed to: Continue with current nutritional and behavioral strategies and start Metformin     ASSOCIATED CONDITIONS ADDRESSED TODAY: Vitamin D  deficiency Assessment & Plan: Lab Results  Component Value Date   VD25OH 65.8 06/06/2023   VD25OH 34 01/11/2023   Last vit D was at goal. Has not been taking vit D supplementation. Advised pt to resume vitamin D  supplementation. Will recheck vit D at next OV.    Prediabetes Assessment & Plan: Lab Results  Component Value Date   HGBA1C 5.8 (H) 06/06/2023   HGBA1C 6.2 (H) 02/28/2023   HGBA1C 5.7 08/03/2022   INSULIN  27.6 (H) 02/28/2023   INSULIN  23.1 08/03/2022  She did not start Metformin  at her last OV, stating insurance initially did not cover full cost. She recently picked up metformin  from the pharmacy, stating insurance eventually approved Metformin . No refill required today. Patient reports increased hunger and cravings during the evening. Reviewed risks/benefits of metformin . Patient is advised to start a half tablet taken with lunch for optimal effect for evening  hunger/cravings. If tolerating well after about 4 days, she may increase to a full tablet once daily with lunch. Encouraged patient to avoid simple carbs/sugars and follow her prudent nutritional meal plan and an effort to avoid GI upset or other possible SE.     Stress Assessment & Plan: She endorses increased stress following the sudden passing of her father on 6/8. This has made it difficult for her to stay on plan and prioritize her own physical and mental health. She does note that she has the support from her siblings, family, and other loved ones during this time. Reviewed how increased stress can negatively affect moods and overall physical health. Encouraged pt to see a grief counselor and try various stress management strategies including meditation and the 4-7-8 breathing method. exercising at least 30 minutes per day.    Essential hypertension, benign Assessment & Plan: BP Readings from Last 3 Encounters:  10/29/23 106/69  10/29/23 113/68  09/25/23 129/74   Compliant with Indapamide  2.5 mg once weekly, Losartan  100 mg once daily, and Spironolactone  25 mg once daily. Tolerating well with no SE. Continue current antihypertensive regimen as prescribed. Encouraged patient to continue working towards following a heart-healthy diet via her nutritional meal plan. Continue to f/u with PCP as instructed by them.     Sleep difficulties Assessment & Plan:Patient has not been getting adequate amount of sleep per night and reports poor sleep quality. She said she's waking up every three hours, although she knows this is not new for her. She has tried going to bed early but states she ends up waking up around 2-3 AM as a result.   Discussed how poorly quality and in adequate hours of sleep per night may be related. Encouraged pt to work on improving sleep hygiene and reviewed strategies today. Aim for 7-9 hours per night. Encouraged pt to maintain a night routine. Will continue to monitor as  relates to her weight loss journey.    B12 deficiency Assessment & Plan: Lab Results  Component Value Date   VITAMINB12 456 06/06/2023   VITAMINB12 422 02/28/2023   VITAMINB12 380 08/03/2022   Not currently taking B12 supplementation; reports recently running out. Reviewed ideal B12 goal of 500 or greater. Reviewed how optimal B12 levels can improve moods, neurological function, and energy levels. Advised pt to resume supplementation.     Follow up:   Return for follow up with Shawn Rayburn PA 11/26/2023 @ 3:00 PM. She was informed of the importance of frequent follow up visits to maximize her success with intensive lifestyle modifications for her multiple health conditions.   Subjective:   Chief complaint: Obesity Kayron is here to discuss her progress with her obesity treatment plan. She is on the Category 1 Plan and states she is following her eating plan approximately 25% of the time. She states she is walking 30 minutes 5 days per week.  Interval History:  LAKE[employer] MITTELMAN is here for a follow up office visit. She last saw Shawn Rayburn PA on 5/28, since then she is up 3 lbs. She has not been following her meal plan as well  since her father suddenly passed on 10-10-2023. She reports friends and loved ones have been bringing her family food for the last few weeks and adds these foods were not always on plan. She does try to prioritize eating chicken.   Pharmacotherapy that aid with weight loss: She is currently taking no anti-obesity medication.   Review of Systems:  Pertinent positives were addressed with patient today.  Reviewed by clinician on day of visit: allergies, medications, problem list, medical history, surgical history, family history, social history, and previous encounter notes.  Weight Summary and Biometrics   Weight Lost Since Last Visit: 0lb  Weight Gained Since Last Visit: 3lb    Vitals Temp: 98.6 F (37 C) BP: 106/69 Pulse Rate: 78 SpO2: 99  %   Anthropometric Measurements Height: 5' 4 (1.626 m) Weight: 235 lb (106.6 kg) BMI (Calculated): 40.32 Weight at Last Visit: 238lb Weight Lost Since Last Visit: 0lb Weight Gained Since Last Visit: 3lb Starting Weight: 236lb Total Weight Loss (lbs): 1 lb (0.454 kg) Peak Weight: 236lb   Body Composition  Body Fat %: 47.5 % Fat Mass (lbs): 111.8 lbs Muscle Mass (lbs): 117.2 lbs Total Body Water (lbs): 81.4 lbs Visceral Fat Rating : 15   Other Clinical Data Fasting: No Labs: no Today's Visit #: 12 Starting Date: 02/28/23    Objective:   PHYSICAL EXAM: Blood pressure 106/69, pulse 78, temperature 98.6 F (37 C), height 5' 4 (1.626 m), weight 235 lb (106.6 kg), SpO2 99%. Body mass index is 40.34 kg/m.  General: she is overweight, cooperative and in no acute distress. PSYCH: Has normal mood, affect and thought process.   HEENT: EOMI, sclerae are anicteric. Lungs: Normal breathing effort, no conversational dyspnea. Extremities: Moves * 4 Neurologic: A and O * 3, good insight  DIAGNOSTIC DATA REVIEWED: BMET    Component Value Date/Time   NA 140 07/04/2023 0943   K 3.9 07/04/2023 0943   CL 102 07/04/2023 0943   CO2 24 07/04/2023 0943   GLUCOSE 98 07/04/2023 0943   GLUCOSE 72 05/23/2023 1443   BUN 15 07/04/2023 0943   CREATININE 1.00 07/04/2023 0943   CREATININE 1.00 05/23/2023 1443   CALCIUM  9.6 07/04/2023 0943   GFRNONAA 64 04/15/2020 1121   GFRNONAA 87 06/13/2016 1436   GFRAA 74 04/15/2020 1121   GFRAA >89 06/13/2016 1436   Lab Results  Component Value Date   HGBA1C 5.8 (H) 06/06/2023   HGBA1C 6.2 (H) 02/11/2009   Lab Results  Component Value Date   INSULIN  27.6 (H) 02/28/2023   INSULIN  23.1 08/03/2022   Lab Results  Component Value Date   TSH 1.54 01/11/2023   CBC    Component Value Date/Time   WBC 6.5 07/04/2023 0943   WBC 6.8 05/23/2023 1443   RBC 4.66 07/04/2023 0943   RBC 4.72 05/23/2023 1443   HGB 12.8 07/04/2023 0943   HCT 40.1  07/04/2023 0943   PLT 371 07/04/2023 0943   MCV 86 07/04/2023 0943   MCH 27.5 07/04/2023 0943   MCH 26.9 (L) 05/23/2023 1443   MCHC 31.9 07/04/2023 0943   MCHC 31.4 (L) 05/23/2023 1443   RDW 13.1 07/04/2023 0943   Iron Studies    Component Value Date/Time   IRON 114 05/18/2008 0405   TIBC 300 05/18/2008 0405   FERRITIN 19 05/18/2008 0405   IRONPCTSAT 38 05/18/2008 0405   Lipid Panel     Component Value Date/Time   CHOL 153 02/28/2023 1110   TRIG 60 02/28/2023 1110  HDL 54 02/28/2023 1110   CHOLHDL 3.1 04/09/2022 1106   CHOLHDL 4.4 06/13/2016 1436   VLDL 30 06/13/2016 1436   LDLCALC 87 02/28/2023 1110   Hepatic Function Panel     Component Value Date/Time   PROT 7.5 07/04/2023 0943   ALBUMIN 4.1 07/04/2023 0943   AST 17 07/04/2023 0943   ALT 15 07/04/2023 0943   ALKPHOS 63 07/04/2023 0943   BILITOT 0.5 07/04/2023 0943   BILIDIR 3.7 (H) 07/28/2012 1200   IBILI 1.7 (H) 07/28/2012 1200      Component Value Date/Time   TSH 1.54 01/11/2023 1057   Nutritional Lab Results  Component Value Date   VD25OH 65.8 06/06/2023   VD25OH 34 01/11/2023    Attestations:   I, Vernell Forest, acting as a medical scribe for Veronica Jenkins, DO., have compiled all relevant documentation for today's office visit on behalf of Veronica Jenkins, DO, while in the presence of [employer], DO.  I have reviewed the above documentation for accuracy and completeness, and I agree with the above. Veronica JINNY [name], D.O.  The 21st Century Cures Act was signed into law in 2016 which includes the topic of electronic health records.  This provides immediate access to information in MyChart.  This includes consultation notes, operative notes, office notes, lab results and pathology reports.  If you have any questions about what you read please let us  know at your next visit so we can discuss your concerns and take corrective action if need be.  We are right here with you.

## 2023-10-30 LAB — HEMOGLOBIN A1C
Est. average glucose Bld gHb Est-mCnc: 126 mg/dL
Hgb A1c MFr Bld: 6 % — ABNORMAL HIGH (ref 4.8–5.6)

## 2023-10-30 LAB — VITAMIN B12: Vitamin B-12: 661 pg/mL (ref 232–1245)

## 2023-10-30 LAB — VITAMIN D 25 HYDROXY (VIT D DEFICIENCY, FRACTURES): Vit D, 25-Hydroxy: 65.4 ng/mL (ref 30.0–100.0)

## 2023-11-06 ENCOUNTER — Ambulatory Visit: Payer: 59 | Attending: Rheumatology | Admitting: Rheumatology

## 2023-11-06 ENCOUNTER — Encounter: Payer: Self-pay | Admitting: Rheumatology

## 2023-11-06 VITALS — BP 110/74 | HR 67 | Resp 16 | Ht 64.0 in | Wt 238.0 lb

## 2023-11-06 DIAGNOSIS — G5601 Carpal tunnel syndrome, right upper limb: Secondary | ICD-10-CM | POA: Diagnosis present

## 2023-11-06 DIAGNOSIS — I10 Essential (primary) hypertension: Secondary | ICD-10-CM

## 2023-11-06 DIAGNOSIS — M25562 Pain in left knee: Secondary | ICD-10-CM | POA: Diagnosis present

## 2023-11-06 DIAGNOSIS — Z79899 Other long term (current) drug therapy: Secondary | ICD-10-CM | POA: Diagnosis present

## 2023-11-06 DIAGNOSIS — M25561 Pain in right knee: Secondary | ICD-10-CM

## 2023-11-06 DIAGNOSIS — M79672 Pain in left foot: Secondary | ICD-10-CM

## 2023-11-06 DIAGNOSIS — R748 Abnormal levels of other serum enzymes: Secondary | ICD-10-CM | POA: Diagnosis present

## 2023-11-06 DIAGNOSIS — E785 Hyperlipidemia, unspecified: Secondary | ICD-10-CM | POA: Diagnosis present

## 2023-11-06 DIAGNOSIS — I700 Atherosclerosis of aorta: Secondary | ICD-10-CM

## 2023-11-06 DIAGNOSIS — R1319 Other dysphagia: Secondary | ICD-10-CM

## 2023-11-06 DIAGNOSIS — M19041 Primary osteoarthritis, right hand: Secondary | ICD-10-CM

## 2023-11-06 DIAGNOSIS — R7303 Prediabetes: Secondary | ICD-10-CM

## 2023-11-06 DIAGNOSIS — M797 Fibromyalgia: Secondary | ICD-10-CM | POA: Diagnosis present

## 2023-11-06 DIAGNOSIS — M19042 Primary osteoarthritis, left hand: Secondary | ICD-10-CM | POA: Diagnosis present

## 2023-11-06 DIAGNOSIS — G8929 Other chronic pain: Secondary | ICD-10-CM

## 2023-11-06 DIAGNOSIS — M3219 Other organ or system involvement in systemic lupus erythematosus: Secondary | ICD-10-CM

## 2023-11-06 DIAGNOSIS — D75A Glucose-6-phosphate dehydrogenase (G6PD) deficiency without anemia: Secondary | ICD-10-CM

## 2023-11-06 DIAGNOSIS — Z820 Family history of epilepsy and other diseases of the nervous system: Secondary | ICD-10-CM | POA: Diagnosis present

## 2023-11-06 DIAGNOSIS — E559 Vitamin D deficiency, unspecified: Secondary | ICD-10-CM

## 2023-11-06 DIAGNOSIS — R5383 Other fatigue: Secondary | ICD-10-CM

## 2023-11-06 DIAGNOSIS — F39 Unspecified mood [affective] disorder: Secondary | ICD-10-CM | POA: Diagnosis present

## 2023-11-06 DIAGNOSIS — M7062 Trochanteric bursitis, left hip: Secondary | ICD-10-CM | POA: Diagnosis present

## 2023-11-06 DIAGNOSIS — G4709 Other insomnia: Secondary | ICD-10-CM

## 2023-11-06 DIAGNOSIS — R091 Pleurisy: Secondary | ICD-10-CM

## 2023-11-06 DIAGNOSIS — M79671 Pain in right foot: Secondary | ICD-10-CM | POA: Diagnosis present

## 2023-11-06 NOTE — Patient Instructions (Signed)
 Vaccines You are taking a medication(s) that can suppress your immune system.  The following immunizations are recommended: Flu annually Covid-19  Td/Tdap (tetanus, diphtheria, pertussis) every 10 years Pneumonia (Prevnar 15 then Pneumovax 23 at least 1 year apart.  Alternatively, can take Prevnar 20 without needing additional dose) Shingrix: 2 doses from 4 weeks to 6 months apart  Please check with your PCP to make sure you are up to date.   Iliotibial Band Syndrome Rehab Ask your health care provider which exercises are safe for you. Do exercises exactly as told by your provider and adjust them as told. It's normal to feel mild stretching, pulling, tightness, or discomfort as you do these exercises. Stop right away if you feel sudden pain or your pain gets a lot worse. Do not begin these exercises until told by your provider. Stretching and range-of-motion exercises These exercises warm up your muscles and joints. They also improve the movement and flexibility of your hip and pelvis. Quadriceps stretch, prone  Lie face down (prone) on a firm surface like a bed or padded floor. Bend your left / right knee. Reach back to hold your ankle or pant leg. If you can't reach your ankle or pant leg, use a belt looped around your foot and grab the belt instead. Gently pull your heel toward your butt. Your knee should not slide out to the side. You should feel a stretch in the front of your thigh and knee, also called the quadriceps. Hold this position for __________ seconds. Repeat __________ times. Complete this exercise __________ times a day. Iliotibial band stretch The iliotibial band is a strip of tissue that runs along the outside of your hip down to your knee. Lie on your side with your left / right leg on top. Bend both knees and grab your left / right ankle. Stretch out your bottom arm to help you balance. Slowly bring your top knee back so your thigh goes behind your back. Slowly lower  your top leg toward the floor until you feel a gentle stretch on the outside of your left / right hip and thigh. If you don't feel a stretch and your knee won't go farther, place the heel of your other foot on top of your knee and pull your knee down toward the floor with your foot. Hold this position for __________ seconds. Repeat __________ times. Complete this exercise __________ times a day. Strengthening exercises These exercises build strength and endurance in your hip and pelvis. Endurance means your muscles can keep working even when they're tired. Straight leg raises, side-lying This exercise strengthens the muscles that rotate the leg at the hip and move it away from your body. These muscles are called hip abductors. Lie on your side with your left / right leg on top. Lie so your head, shoulder, hip, and knee line up. You can bend your bottom knee to help you balance. Roll your hips slightly forward so they're stacked directly over each other. Your left / right knee should face forward. Tense the muscles in your outer thigh and hip. Lift your top leg 4-6 inches (10-15 cm) off the ground. Hold this position for __________ seconds. Slowly lower your leg back down to the starting position. Let your muscles fully relax before doing this exercise again. Repeat __________ times. Complete this exercise __________ times a day. Leg raises, prone This exercise strengthens the muscles that move the hips backward. These muscles are called hip extensors. Lie face down (prone) on  your bed or a firm surface. You can put a pillow under your hips for comfort and to support your lower back. Bend your left / right knee so your foot points straight up toward the ceiling. Keep the other leg straight and behind you. Squeeze your butt muscles. Lift your left / right thigh off the firm surface. Do not let your back arch. Tense your thigh muscle as hard as you can without having more knee pain. Hold this  position for __________ seconds. Slowly lower your leg to the starting position. Allow your leg to relax all the way. Repeat __________ times. Complete this exercise __________ times a day. Hip hike  Stand sideways on a bottom step. Place your feet so that your left / right leg is on the step, and the other foot is hanging off the side. If you need support for balance, hold onto a railing or wall. Keep your knees straight and your abdomen square, meaning your hips are level. Then, lift your left / right hip up toward the ceiling. Slowly let your leg that's hanging off the step lower towards the floor. Your foot should get closer to the ground. Do not lean or bend your knees during this movement. Repeat __________ times. Complete this exercise __________ times a day. This information is not intended to replace advice given to you by your health care provider. Make sure you discuss any questions you have with your health care provider. Document Revised: 06/29/2022 Document Reviewed: 06/29/2022 Elsevier Patient Education  2024 [employer].

## 2023-11-09 LAB — COMPREHENSIVE METABOLIC PANEL WITH GFR
AG Ratio: 1.2 (calc) (ref 1.0–2.5)
ALT: 15 U/L (ref 6–29)
AST: 22 U/L (ref 10–35)
Albumin: 4.1 g/dL (ref 3.6–5.1)
Alkaline phosphatase (APISO): 58 U/L (ref 37–153)
BUN/Creatinine Ratio: 15 (calc) (ref 6–22)
BUN: 16 mg/dL (ref 7–25)
CO2: 26 mmol/L (ref 20–32)
Calcium: 9.6 mg/dL (ref 8.6–10.4)
Chloride: 101 mmol/L (ref 98–110)
Creat: 1.06 mg/dL — ABNORMAL HIGH (ref 0.50–1.05)
Globulin: 3.5 g/dL (ref 1.9–3.7)
Glucose, Bld: 91 mg/dL (ref 65–99)
Potassium: 3.1 mmol/L — ABNORMAL LOW (ref 3.5–5.3)
Sodium: 137 mmol/L (ref 135–146)
Total Bilirubin: 0.5 mg/dL (ref 0.2–1.2)
Total Protein: 7.6 g/dL (ref 6.1–8.1)
eGFR: 60 mL/min/1.73m2 (ref >=60)

## 2023-11-09 LAB — C3 AND C4
C3 Complement: 143 mg/dL (ref 83–193)
C4 Complement: 23 mg/dL (ref 15–57)

## 2023-11-09 LAB — ANTI-NUCLEAR AB-TITER (ANA TITER)
ANA TITER: 1:160 {titer} — ABNORMAL HIGH
ANA Titer 1: 1:640 {titer} — ABNORMAL HIGH

## 2023-11-09 LAB — CBC WITH DIFFERENTIAL/PLATELET
Absolute Lymphocytes: 1896 {cells}/uL (ref 850–3900)
Absolute Monocytes: 540 {cells}/uL (ref 200–950)
Basophils Absolute: 30 {cells}/uL (ref 0–200)
Basophils Relative: 0.5 %
Eosinophils Absolute: 60 {cells}/uL (ref 15–500)
Eosinophils Relative: 1 %
HCT: 41.2 % (ref 35.0–45.0)
Hemoglobin: 13.4 g/dL (ref 11.7–15.5)
MCH: 28 pg (ref 27.0–33.0)
MCHC: 32.5 g/dL (ref 32.0–36.0)
MCV: 86 fL (ref 80.0–100.0)
MPV: 9.6 fL (ref 7.5–12.5)
Monocytes Relative: 9 %
Neutro Abs: 3474 {cells}/uL (ref 1500–7800)
Neutrophils Relative %: 57.9 %
Platelets: 359 Thousand/uL (ref 140–400)
RBC: 4.79 Million/uL (ref 3.80–5.10)
RDW: 12.2 % (ref 11.0–15.0)
Total Lymphocyte: 31.6 %
WBC: 6 Thousand/uL (ref 3.8–10.8)

## 2023-11-09 LAB — SEDIMENTATION RATE: Sed Rate: 14 mm/h (ref 0–30)

## 2023-11-09 LAB — ANTI-DNA ANTIBODY, DOUBLE-STRANDED: ds DNA Ab: 2 [IU]/mL

## 2023-11-09 LAB — PROTEIN / CREATININE RATIO, URINE
Creatinine, Urine: 182 mg/dL (ref 20–275)
Protein/Creat Ratio: 55 mg/g{creat} (ref 24–184)
Protein/Creatinine Ratio: 0.055 mg/mg{creat} (ref 0.024–0.184)
Total Protein, Urine: 10 mg/dL (ref 5–24)

## 2023-11-09 LAB — ANA: Anti Nuclear Antibody (ANA): POSITIVE — AB

## 2023-11-10 ENCOUNTER — Other Ambulatory Visit: Payer: Self-pay | Admitting: Rheumatology

## 2023-11-10 DIAGNOSIS — M329 Systemic lupus erythematosus, unspecified: Secondary | ICD-10-CM

## 2023-11-11 ENCOUNTER — Ambulatory Visit: Admitting: Licensed Clinical Social Worker

## 2023-11-11 ENCOUNTER — Ambulatory Visit: Payer: Self-pay | Admitting: Rheumatology

## 2023-11-11 NOTE — Telephone Encounter (Signed)
 Last Fill: 05/06/2023  Eye exam: 02/20/2023 WNL    Labs: 11/06/2023, CMP shows mildly elevated creatinine CBC normal   Next Visit: 05/06/2024  Last Visit: 11/06/2023  IK:Nuyzm systemic lupus erythematosus with other organ involvement   Current Dose per office note 11/06/2023: Plaquenil  200 mg p.o. twice daily   Okay to refill Plaquenil ?

## 2023-11-11 NOTE — Progress Notes (Signed)
 ANA 1: 640 NS, CMP shows mildly elevated creatinine, patient should increase water intake, urine protein creatinine ratio normal, CBC normal, double-stranded DNA negative, complements normal.  Labs do not indicate an autoimmune disease flare.  No change in treatment advised.

## 2023-11-26 ENCOUNTER — Ambulatory Visit (INDEPENDENT_AMBULATORY_CARE_PROVIDER_SITE_OTHER): Admitting: Physician Assistant

## 2023-11-26 ENCOUNTER — Encounter (INDEPENDENT_AMBULATORY_CARE_PROVIDER_SITE_OTHER): Payer: Self-pay | Admitting: Physician Assistant

## 2023-11-26 VITALS — BP 118/79 | HR 68 | Temp 98.4°F | Ht 64.0 in | Wt 230.0 lb

## 2023-11-26 DIAGNOSIS — R7303 Prediabetes: Secondary | ICD-10-CM | POA: Diagnosis not present

## 2023-11-26 DIAGNOSIS — I10 Essential (primary) hypertension: Secondary | ICD-10-CM | POA: Diagnosis not present

## 2023-11-26 DIAGNOSIS — F4321 Adjustment disorder with depressed mood: Secondary | ICD-10-CM | POA: Diagnosis not present

## 2023-11-26 DIAGNOSIS — E559 Vitamin D deficiency, unspecified: Secondary | ICD-10-CM | POA: Diagnosis not present

## 2023-11-26 DIAGNOSIS — E876 Hypokalemia: Secondary | ICD-10-CM

## 2023-11-26 DIAGNOSIS — E669 Obesity, unspecified: Secondary | ICD-10-CM

## 2023-11-26 DIAGNOSIS — Z6839 Body mass index (BMI) 39.0-39.9, adult: Secondary | ICD-10-CM

## 2023-11-26 MED ORDER — METFORMIN HCL 500 MG PO TABS: 500.0000 mg | ORAL_TABLET | Freq: Every day | ORAL | 0 refills | Status: DC

## 2023-11-26 MED ORDER — VITAMIN D (ERGOCALCIFEROL) 1.25 MG (50000 UNIT) PO CAPS: 50000.0000 [IU] | ORAL_CAPSULE | ORAL | 0 refills | Status: DC

## 2023-11-26 NOTE — Progress Notes (Signed)
 SUBJECTIVE: Discussed the use of AI scribe software for clinical note transcription with the patient, who gave verbal consent to proceed.  Chief Complaint: Obesity  Interim History: She is down 5 lbs since last visit.  Down 6 lbs overall TBW loss of 2.5% Muscle mass + 0.2 lbs Adipose mass - 5.4 lbs Cyrilla is here to discuss her progress with her obesity treatment plan. She is on the Category 1 Plan and states she is not following her eating plan approximately 60-75 % of the time. She states she is walking for exercise 30 minutes 3 times per week.  LACI FRENKEL is a 63 year old female who presents for follow-up of her obesity treatment plan.  She has not been following her obesity treatment plan as intended but has managed to lose five pounds. She reports a lack of appetite and irregular eating patterns following the recent loss of her father, and describes feeling depressed. She is scheduled to see a therapist soon.  She has a history of prediabetes and is currently taking metformin  500 mg every morning. Metformin  causes frequent urination and occasional diarrhea, particularly after consuming simple carbohydrates like potatoes. She avoids sugary and salty foods, including chips and sweetened beverages, but mentions that sweet tea is her 'downfall'.  She has a history of vitamin D  and B12 deficiency and is taking vitamin D  supplements without adverse effects. Recent lab work showed a low potassium level and slightly elevated creatinine, which was marginally increased from her baseline. No follow-up labs have been conducted since then.  She reports ongoing sleep difficulties, stating she is 'still not sleeping good'.  She works as a live-in Engineer, structural and finds her work helps keep her mind occupied. Her diet includes an egg in the morning, a [place] sandwich for lunch, and snacks on [place] yogurt and fruit, although she acknowledges not adhering strictly to her dietary  plan.  OBJECTIVE: Visit Diagnoses: Problem List Items Addressed This Visit     Prediabetes - Primary   Relevant Medications   metFORMIN  (GLUCOPHAGE ) 500 MG tablet   Vitamin D  deficiency   Relevant Medications   Vitamin D , Ergocalciferol , (DRISDOL ) 1.25 MG (50000 UNIT) CAPS capsule   Essential hypertension, benign (Chronic)   Other Visit Diagnoses       Grief         Hypokalemia       Relevant Orders   Basic Metabolic Panel (BMET)     Obesity with starting BMI of 40.5       Relevant Medications   metFORMIN  (GLUCOPHAGE ) 500 MG tablet     BMI 39.0-39.9,adult Current BMI 39.5         Obesity Kenyah Luba, a 63 year old female, is managing obesity with some success, having lost five pounds despite recent personal stressors, including her father's passing. She is partially adhering to her dietary plan, avoiding sugary and salty foods, and consuming [place] yogurt and fruits. - Continue current dietary plan with emphasis on balanced meals and avoiding long periods without eating. - Encourage continued avoidance of sugary and salty foods. - Reinforce the importance of maintaining a balanced diet despite personal stressors.  Prediabetes Nathanel is managing prediabetes with metformin , which she takes every morning. She reports it helps control cravings for sweet foods but experiences occasional diarrhea with simple carbohydrates. Her recent A1c was elevated, likely due to dietary changes after her father's passing. Lab Results  Component Value Date   HGBA1C 6.0 (H) 10/29/2023   Continue working on Engineer, technical sales  to decrease simple carbohydrates, increase lean proteins and exercise to promote weight loss, improve glycemic control and prevent progression to Type 2 diabetes.  - Continue metformin  500 mg daily in the morning. - Monitor blood sugar levels and dietary intake, especially avoiding simple carbohydrates. - Order lab tests to monitor kidney function and potassium levels due to  metformin  use.  Hypertension Hypertension asymptomatic, reasonably well controlled, and no significant medication side effects noted.  Medication(s): Lozol  2.5 mg daily   Losartan  100 mg daily   Spironolactone  25 mg daily.   BP Readings from Last 3 Encounters:  11/26/23 118/79  11/06/23 110/74  10/29/23 106/69   Lab Results  Component Value Date   CREATININE 1.06 (H) 11/06/2023   CREATININE 1.00 07/04/2023   CREATININE 1.00 05/23/2023   No results found for: GFR  Plan: Continue all antihypertensives at current dosages. Continue to work on nutrition plan to promote weight loss and improve BP control.  Recheck renal function and potassium today and address if needed.   Hypokalemia and mildly decreased kidney function Recent labs indicate low potassium levels and slightly elevated creatinine. She is on spironolactone , which should help retain potassium, but levels remain low. Further evaluation is needed. - Order lab tests to recheck potassium levels and kidney function. - If potassium remains low, refer to primary care physician for further management. - Monitor kidney function closely, especially in the context of metformin  use.  Depression/grief Nathanel is experiencing depression, likely exacerbated by her father's recent passing. She reports a lack of appetite and poor sleep. She is scheduled to see a therapist and prefers not to start new medications for depression at this time. No SI/HI or other concerns.  - Encourage continuation of therapy sessions for emotional support. - Monitor mental health status and consider further intervention if symptoms persist.  Vitamin D  deficiency Nathanel is on vitamin D  supplementation- Ergocalciferol  50,000 units once weekly without adverse effects and requires a prescription refill. Vitamin D  level is at goal of 50-70. Last vitamin D  Lab Results  Component Value Date   VD25OH 65.4 10/29/2023    - Refill vitamin D   prescription.-Ergocalciferol  50,000 units once weekly.  - Continue current vitamin D  supplementation regimen. Recheck vitamin D  level several times yearly to optimize supplementation.  Low vitamin D  levels can be associated with adiposity and may result in leptin resistance and weight gain. Also associated with fatigue.  Currently on vitamin D  supplementation without any adverse effects such as nausea, vomiting or muscle weakness.    Vitals Temp: 98.4 F (36.9 C) BP: 118/79 Pulse Rate: 68 SpO2: 100 %   Anthropometric Measurements Height: 5' 4 (1.626 m) Weight: 230 lb (104.3 kg) BMI (Calculated): 39.46 Weight at Last Visit: 235 lb Weight Lost Since Last Visit: 5 lb Weight Gained Since Last Visit: 0 Starting Weight: 236 lb Total Weight Loss (lbs): 6 lb (2.722 kg) Peak Weight: 236 lb   Body Composition  Body Fat %: 46.3 % Fat Mass (lbs): 106.4 lbs Muscle Mass (lbs): 117.4 lbs Total Body Water (lbs): 78 lbs Visceral Fat Rating : 15   Other Clinical Data Fasting: No Labs: Yes Today's Visit #: 13 Starting Date: 02/28/23     ASSESSMENT AND PLAN:  Diet: Lilou is currently in the action stage of change. As such, her goal is to continue with weight loss efforts. She has agreed to Category 1 Plan.  Exercise: Chrystle has been instructed to try a geriatric exercise plan and that some exercise is better than  none for weight loss and overall health benefits.   Behavior Modification:  We discussed the following Behavioral Modification Strategies today: increasing lean protein intake, decreasing simple carbohydrates, increasing vegetables, increase H2O intake, no skipping meals, meal planning and cooking strategies, emotional eating strategies , avoiding temptations, and planning for success. We discussed various medication options to help [facility] with her weight loss efforts and we both agreed to continue current treatment plan.  Return in about 4 weeks (around 12/24/2023).SABRA She  was informed of the importance of frequent follow up visits to maximize her success with intensive lifestyle modifications for her multiple health conditions.  Attestation Statements:   Reviewed by clinician on day of visit: allergies, medications, problem list, medical history, surgical history, family history, social history, and previous encounter notes.   Time spent on visit including pre-visit chart review and post-visit care and charting was 33 minutes.    Winefred Hillesheim, PA-C

## 2023-11-27 ENCOUNTER — Ambulatory Visit (INDEPENDENT_AMBULATORY_CARE_PROVIDER_SITE_OTHER): Payer: PRIVATE HEALTH INSURANCE | Admitting: Licensed Clinical Social Worker

## 2023-11-27 ENCOUNTER — Ambulatory Visit (INDEPENDENT_AMBULATORY_CARE_PROVIDER_SITE_OTHER): Payer: Self-pay | Admitting: Physician Assistant

## 2023-11-27 DIAGNOSIS — F411 Generalized anxiety disorder: Secondary | ICD-10-CM

## 2023-11-27 DIAGNOSIS — F331 Major depressive disorder, recurrent, moderate: Secondary | ICD-10-CM

## 2023-11-27 LAB — BASIC METABOLIC PANEL WITH GFR
BUN/Creatinine Ratio: 18 (ref 12–28)
BUN: 17 mg/dL (ref 8–27)
CO2: 22 mmol/L (ref 20–29)
Calcium: 9.8 mg/dL (ref 8.7–10.3)
Chloride: 101 mmol/L (ref 96–106)
Creatinine, Ser: 0.96 mg/dL (ref 0.57–1.00)
Glucose: 118 mg/dL — ABNORMAL HIGH (ref 70–99)
Potassium: 3.3 mmol/L — ABNORMAL LOW (ref 3.5–5.2)
Sodium: 138 mmol/L (ref 134–144)
eGFR: 68 mL/min/1.73 (ref >59)

## 2023-11-28 NOTE — Progress Notes (Signed)
 Veronica [name] Progress Note  Patient ID: Veronica [name], MRN: [id number]    Date: 11/27/2023  Time Spent: 0300 p - 0405 pm : 65 Minutes  Treatment Type: Individual Therapy.  Symptoms of anxiety and depression related to work stress and marriage. Most recently patient reports her father passed unexpectedly.    Mental Status Exam: Appearance:  Casual     Behavior: Appropriate  Motor: Normal  Speech/Language:  Clear and Coherent  Affect: Appropriate  Mood: normal  Thought process: normal  Thought content:   WNL  Sensory/Perceptual disturbances:   WNL  Orientation: oriented to person, place, time/date, situation, day of week, month of year, and year  Attention: Good  Concentration: Good  Memory: WNL  Fund of knowledge:  Good  Insight:   Good  Judgment:  Good  Impulse Control: Good    Risk Assessment: Danger to Self:  No Self-injurious Behavior: No Danger to Others: No Duty to Warn:no Physical Aggression / Violence:No  Access to Firearms a concern: No  Gang Involvement:No    Subjective:    Veronica [name] participated from office, located at [employer] with Clinician present. Veronica [name] consented to treatment.   Veronica [name] presented for her session reporting that she has been in a rough place. She reports that her father passed away and it was sudden and the family was not prepared for how quickly he declined. She reports that she has not been working as her boss has not been paying them. She reports that she hasn't been paid in several weeks and was recently laid off. Veronica [name] reports that her husband has now lost 2 more jobs and has not been working. She states that he lays in bed and drinks, and does drugs. She states he has no motivation and is always fussing or complaining about something. Veronica [name] identified that this is very unhealthy for her and she is not sure what to do. She states he refuses to leave the house and he said if she leaves he will  follow her. Patient reports that she is uncertain of what to do. Patient reports feeling trapped and unhappy.  Clinician provided support and encouragement via active listening and verbal encouragement. Clinician processed with patient concerns about the negative impact all of this is having on her overall wellness. Clinician encouraged patient to speak with her spouse about how she needs him due to her current situation at work and she feels she cannot rely on him to help her. Clinician and patient discussed and processed I statements and the importance of communication even when it's uncomfortable in order to reach a decision.  Veronica [name] was fully engaged in discussion during session and is motivated for treatment. Veronica [name] will utilize coping skills to decrease symptoms of anxiety and depression. atient is to use CBT, mindfulness and coping skills to help manage decrease symptoms associated with their diagnosis.Veronica [name] will reduce overall level, frequency, and intensity of the feelings of depression/grief and anxiety. Veronica [name] will continue to engage in bi weekly therapy. Treatment planning to be reviewed by 01/08/2024.    Interventions: Cognitive Behavioral Therapy, Dialectical Behavioral Therapy, Assertiveness/Communication, Mindfulness Meditation, Motivational Interviewing, and Solution-Oriented/Positive Psychology, grief.  Diagnosis: Generalized Anxiety Disorder, Major Depressive Disorder, recurrent moderate   Damien Junk MSW, LCSW/DATE 11/27/2023

## 2023-12-09 ENCOUNTER — Other Ambulatory Visit: Payer: Self-pay | Admitting: *Deleted

## 2023-12-09 DIAGNOSIS — S46812A Strain of other muscles, fascia and tendons at shoulder and upper arm level, left arm, initial encounter: Secondary | ICD-10-CM

## 2023-12-10 MED ORDER — CYCLOBENZAPRINE HCL 5 MG PO TABS: 5.0000 mg | ORAL_TABLET | Freq: Three times a day (TID) | ORAL | 1 refills | Status: DC | PRN

## 2023-12-18 ENCOUNTER — Ambulatory Visit: Payer: PRIVATE HEALTH INSURANCE | Admitting: Licensed Clinical Social Worker

## 2023-12-25 ENCOUNTER — Other Ambulatory Visit: Payer: Self-pay | Admitting: Physician Assistant

## 2023-12-25 ENCOUNTER — Encounter (INDEPENDENT_AMBULATORY_CARE_PROVIDER_SITE_OTHER): Payer: Self-pay | Admitting: Family Medicine

## 2023-12-25 ENCOUNTER — Ambulatory Visit (INDEPENDENT_AMBULATORY_CARE_PROVIDER_SITE_OTHER): Admitting: Family Medicine

## 2023-12-25 DIAGNOSIS — E669 Obesity, unspecified: Secondary | ICD-10-CM | POA: Diagnosis not present

## 2023-12-25 DIAGNOSIS — R7303 Prediabetes: Secondary | ICD-10-CM | POA: Diagnosis not present

## 2023-12-25 DIAGNOSIS — E559 Vitamin D deficiency, unspecified: Secondary | ICD-10-CM | POA: Diagnosis not present

## 2023-12-25 DIAGNOSIS — Z6839 Body mass index (BMI) 39.0-39.9, adult: Secondary | ICD-10-CM

## 2023-12-25 MED ORDER — METFORMIN HCL 500 MG PO TABS: 500.0000 mg | ORAL_TABLET | Freq: Every day | ORAL | 0 refills | Status: DC

## 2023-12-25 MED ORDER — VITAMIN D (ERGOCALCIFEROL) 1.25 MG (50000 UNIT) PO CAPS: 50000.0000 [IU] | ORAL_CAPSULE | ORAL | 0 refills | Status: DC

## 2023-12-25 NOTE — Progress Notes (Signed)
 Veronica [name], D.O.  ABFM, ABOM Specializing in Clinical Bariatric Medicine  Office located at: [address] [place], [place]  [address]   Assessment and Plan:   Medications Discontinued During This Encounter  Medication Reason   metFORMIN  (GLUCOPHAGE ) 500 MG tablet Reorder   Vitamin D , Ergocalciferol , (DRISDOL ) 1.25 MG (50000 UNIT) CAPS capsule Reorder    Meds ordered this encounter  Medications   metFORMIN  (GLUCOPHAGE ) 500 MG tablet    Sig: Take 1 tablet (500 mg total) by mouth daily.    Dispense:  30 tablet    Refill:  0   Vitamin D , Ergocalciferol , (DRISDOL ) 1.25 MG (50000 UNIT) CAPS capsule    Sig: Take 1 capsule (50,000 Units total) by mouth every 7 (seven) days.    Dispense:  5 capsule    Refill:  0      FOR THE DISEASE OF OBESITY:  Obesity with starting BMI of 40.5 BMI 39.0-39.9,adult Current BMI 39.29 Assessment & Plan: Since last office visit on 11/06/23 patient's muscle mass has decreased by 1.8 lbs. Fat mass has increased by 1.2 lbs. Total body water has decreased by 1 lbs.  Body fat % has decreased by 0.6 %. Counseling done on how various foods will affect these numbers and how to maximize success  Total lbs lost to date: 7 lbs Total weight loss percentage to date: -2.97 %   Recommended Dietary Goals Kamari is currently in the action stage of change. As such, her goal is to continue weight management plan.  Her RMR is at 994. Reviewed benefits of journaling including increasing her mindfulness of what she is eating on a daily basis and visualizing recommended portion sizes. Pt prefers to write it down instead of using apps.   She has agreed to: continue current plan CAT 3 MP and she will be start journaling her daily intake on paper.   Behavioral Intervention We discussed the following today: increasing lean protein intake to established goals, decreasing simple carbohydrates , increasing vegetables, avoiding skipping meals, and increasing water  intake , extensively reviewed the role of protein in weight management including how increasing lean protein may improve metabolism and build muscle mass. Benefits of journaling to increase her mindfulness to what she is eating on a daily basis.   Additional resources provided today: Physician provided patient with handouts and personalized instruction on tracking and journaling using Apps (or how to handwrite in notebook) and using logs provided  and Handout on Daily Food Journaling Log  Evidence-based interventions for health behavior change were utilized today including the discussion of self monitoring techniques, problem-solving barriers and SMART goal setting techniques.   Regarding patient's less desirable eating habits and patterns, we employed the technique of small changes.   Pt will specifically work on: Journal daily intake on paper and bring her log to her next OV.    Recommended Physical Activity Goals Naryiah has been advised to work up to 300-450 minutes of moderate intensity aerobic activity a week and strengthening exercises 2-3 times per week for cardiovascular health, weight loss maintenance and preservation of muscle mass.   She has agreed to: Continue current level of physical activity  and Increase physical activity in their day and reduce sedentary time (increase NEAT).   Pharmacotherapy We both agreed to: Continue with current nutritional and behavioral strategies and continue meds that aid in wt loss (Metformin  and Topamax )   ASSOCIATED CONDITIONS ADDRESSED TODAY:  Prediabetes Assessment & Plan: Lab Results  Component Value  Date   HGBA1C 6.0 (H) 10/29/2023   HGBA1C 5.8 (H) 06/06/2023   HGBA1C 6.2 (H) 02/28/2023   INSULIN  27.6 (H) 02/28/2023   INSULIN  23.1 08/03/2022   Reviewed last obtained labs: A1c worsening from 5.8 to 6.0. Started Metformin  at her LOV on 7/29. Currently on Metformin  500 mg once daily with good compliance/tolerating. No GI upset or adverse  side effects. Reports good clinical effect; hunger/cravings are well controlled. She adds that since eating healthy and starting Metformin , her favorite foods do not taste the same.  Reviewed benefits of Metformin  as it relates to improving control of hunger/cravings. Continue with current med regimen; will refill Metformin  with no dose changes. Avoid skipping meals. Work on following her nutritional meal plan, regularly exercising, and properly hydrating on a daily basis. Will continue monitoring.     Vitamin D  deficiency Assessment & Plan: Lab Results  Component Value Date   VD25OH 65.4 10/29/2023   VD25OH 65.8 06/06/2023   VD25OH 34 01/11/2023   Currently on ERGO 50K units once wkly. Good compliance/tolerance. No adverse SE or acute concerns in this regard.   Continue current supplementation regimen. Will refill ERGO today, no dose changes. Will continue monitoring.    Follow up:   Return for f/u on 01/20/2024 at 10:00 AM. She was informed of the importance of frequent follow up visits to maximize her success with intensive lifestyle modifications for her multiple health conditions.  Subjective:   Chief complaint: Obesity Zakeria is here to discuss her progress with her obesity treatment plan. She is on the Category 1 Plan and states she is following her eating plan approximately 50% of the time. She states she is walking 30 minutes 3 days per week.   Interval History:  AMIE COWENS is here for a follow up office visit. Since last OV on 11/26/23, she is down 1 lb. She has not been meeting her protein intake goal. She reports eating more fruits and whole foods as well as drinking adequate amounts of water daily.   She reports a decreased appetite. Reports skips dinner most days due to going to bed early, around 7 pm. She adds that she has been avoiding eating before 6 pm. Has not been drinking sweet tea since it is no longer tasting good. She has been experiencing grief recently  with her father's recent passing on 6/8. Her moods have overall improved, but she is aware it will become more difficult around his birthday in December.    Pharmacotherapy that aid with weight loss: She is currently taking Metformin  500 mg once daily and Topamax  50 mg once daily.    Review of Systems:  Pertinent positives were addressed with patient today.  Reviewed by clinician on day of visit: allergies, medications, problem list, medical history, surgical history, family history, social history, and previous encounter notes.  Weight Summary and Biometrics   Weight Lost Since Last Visit: 1lb  Weight Gained Since Last Visit: 0lb   Vitals Temp: 98.1 F (36.7 C) BP: 131/84 Pulse Rate: 68 SpO2: 98 %   Anthropometric Measurements Height: 5' 4 (1.626 m) Weight: 229 lb (103.9 kg) BMI (Calculated): 39.29 Weight at Last Visit: 230lb Weight Lost Since Last Visit: 1lb Weight Gained Since Last Visit: 0lb Starting Weight: 236lb Total Weight Loss (lbs): 7 lb (3.175 kg) Peak Weight: 236lb   Body Composition  Body Fat %: 46.9 % Fat Mass (lbs): 107.6 lbs Muscle Mass (lbs): 115.6 lbs Total Body Water (lbs): 77 lbs Visceral Fat Rating :  15   Other Clinical Data Fasting: yes Labs: no Today's Visit #: 14 Starting Date: 02/28/23    Objective:   PHYSICAL EXAM: Blood pressure 131/84, pulse 68, temperature 98.1 F (36.7 C), height 5' 4 (1.626 m), weight 229 lb (103.9 kg), SpO2 98%. Body mass index is 39.31 kg/m.  General: she is overweight, cooperative and in no acute distress. PSYCH: Has normal mood, affect and thought process.   HEENT: EOMI, sclerae are anicteric. Lungs: Normal breathing effort, no conversational dyspnea. Extremities: Moves * 4 Neurologic: A and O * 3, good insight  DIAGNOSTIC DATA REVIEWED: BMET    Component Value Date/Time   NA 138 11/26/2023 1537   K 3.3 (L) 11/26/2023 1537   CL 101 11/26/2023 1537   CO2 22 11/26/2023 1537   GLUCOSE 118  (H) 11/26/2023 1537   GLUCOSE 91 11/06/2023 1553   BUN 17 11/26/2023 1537   CREATININE 0.96 11/26/2023 1537   CREATININE 1.06 (H) 11/06/2023 1553   CALCIUM  9.8 11/26/2023 1537   GFRNONAA 64 04/15/2020 1121   GFRNONAA 87 06/13/2016 1436   GFRAA 74 04/15/2020 1121   GFRAA >89 06/13/2016 1436   Lab Results  Component Value Date   HGBA1C 6.0 (H) 10/29/2023   HGBA1C 6.2 (H) 02/11/2009   Lab Results  Component Value Date   INSULIN  27.6 (H) 02/28/2023   INSULIN  23.1 08/03/2022   Lab Results  Component Value Date   TSH 1.54 01/11/2023   CBC    Component Value Date/Time   WBC 6.0 11/06/2023 1553   RBC 4.79 11/06/2023 1553   HGB 13.4 11/06/2023 1553   HGB 12.8 07/04/2023 0943   HCT 41.2 11/06/2023 1553   HCT 40.1 07/04/2023 0943   PLT 359 11/06/2023 1553   PLT 371 07/04/2023 0943   MCV 86.0 11/06/2023 1553   MCV 86 07/04/2023 0943   MCH 28.0 11/06/2023 1553   MCHC 32.5 11/06/2023 1553   RDW 12.2 11/06/2023 1553   RDW 13.1 07/04/2023 0943   Iron Studies    Component Value Date/Time   IRON 114 05/18/2008 0405   TIBC 300 05/18/2008 0405   FERRITIN 19 05/18/2008 0405   IRONPCTSAT 38 05/18/2008 0405   Lipid Panel     Component Value Date/Time   CHOL 153 02/28/2023 1110   TRIG 60 02/28/2023 1110   HDL 54 02/28/2023 1110   CHOLHDL 3.1 04/09/2022 1106   CHOLHDL 4.4 06/13/2016 1436   VLDL 30 06/13/2016 1436   LDLCALC 87 02/28/2023 1110   Hepatic Function Panel     Component Value Date/Time   PROT 7.6 11/06/2023 1553   PROT 7.5 07/04/2023 0943   ALBUMIN 4.1 07/04/2023 0943   AST 22 11/06/2023 1553   ALT 15 11/06/2023 1553   ALKPHOS 63 07/04/2023 0943   BILITOT 0.5 11/06/2023 1553   BILITOT 0.5 07/04/2023 0943   BILIDIR 3.7 (H) 07/28/2012 1200   IBILI 1.7 (H) 07/28/2012 1200      Component Value Date/Time   TSH 1.54 01/11/2023 1057   Nutritional Lab Results  Component Value Date   VD25OH 65.4 10/29/2023   VD25OH 65.8 06/06/2023   VD25OH 34 01/11/2023     Attestations:   I, Vernell Forest, acting as a Stage manager for Veronica Jenkins, DO., have compiled all relevant documentation for today's office visit on behalf of Veronica Jenkins, DO, while in the presence of [employer], DO.  I have reviewed the above documentation for accuracy and completeness, and I agree with the above.  Veronica [name], D.O.  The 21st Century Cures Act was signed into law in 2016 which includes the topic of electronic health records.  This provides immediate access to information in MyChart.  This includes consultation notes, operative notes, office notes, lab results and pathology reports.  If you have any questions about what you read please let us  know at your next visit so we can discuss your concerns and take corrective action if need be.  We are right here with you.

## 2024-01-19 NOTE — Progress Notes (Deleted)
   SUBJECTIVE: Discussed the use of AI scribe software for clinical note transcription with the patient, who gave verbal consent to proceed.  Chief Complaint: Obesity  Interim History: ***  Veronica [name] is here to discuss her progress with her obesity treatment plan. She is on the {HWW Weight Loss Plan:210964005} and states she {CHL AMB IS/IS NOT:210130109} following her eating plan approximately *** % of the time. She states she {CHL AMB IS/IS NOT:210130109} exercising *** minutes *** times per week.   OBJECTIVE: Visit Diagnoses: Problem List Items Addressed This Visit     Prediabetes - Primary   Vitamin D  deficiency   Essential hypertension, benign (Chronic)   Other Visit Diagnoses       B12 deficiency         Sleep difficulties         Obesity with starting BMI of 40.5           No data recorded No data recorded No data recorded No data recorded   ASSESSMENT AND PLAN:  Diet: Veronica [name] {CHL AMB IS/IS NOT:210130109} currently in the action stage of change. As such, her goal is to {HWW Weight Loss Efforts:210964006}. She {HAS HAS WNU:81165} agreed to {HWW Weight Loss Plan:210964005}.  Exercise: Veronica [name] has been instructed {HWW Exercise:210964007} for weight loss and overall health benefits.   Behavior Modification:  We discussed the following Behavioral Modification Strategies today: {HWW Behavior Modification:210964008}. We discussed various medication options to help Veronica [name] with her weight loss efforts and we both agreed to ***.  No follow-ups on file.Veronica [name] She was informed of the importance of frequent follow up visits to maximize her success with intensive lifestyle modifications for her multiple health conditions.  Attestation Statements:   Reviewed by clinician on day of visit: allergies, medications, problem list, medical history, surgical history, family history, social history, and previous encounter notes.   Time spent on visit including pre-visit chart review and  post-visit care and charting was *** minutes.    Veronica Winbush, PA-C

## 2024-01-20 ENCOUNTER — Ambulatory Visit (INDEPENDENT_AMBULATORY_CARE_PROVIDER_SITE_OTHER): Admitting: Physician Assistant

## 2024-01-20 DIAGNOSIS — E538 Deficiency of other specified B group vitamins: Secondary | ICD-10-CM

## 2024-01-20 DIAGNOSIS — E559 Vitamin D deficiency, unspecified: Secondary | ICD-10-CM

## 2024-01-20 DIAGNOSIS — R7303 Prediabetes: Secondary | ICD-10-CM

## 2024-01-20 DIAGNOSIS — G479 Sleep disorder, unspecified: Secondary | ICD-10-CM

## 2024-01-20 DIAGNOSIS — I10 Essential (primary) hypertension: Secondary | ICD-10-CM

## 2024-01-21 ENCOUNTER — Encounter (INDEPENDENT_AMBULATORY_CARE_PROVIDER_SITE_OTHER): Payer: Self-pay | Admitting: Adult Health

## 2024-01-21 ENCOUNTER — Ambulatory Visit (INDEPENDENT_AMBULATORY_CARE_PROVIDER_SITE_OTHER): Admitting: Adult Health

## 2024-01-21 VITALS — BP 126/84 | HR 69 | Temp 99.1°F | Ht 64.0 in | Wt 226.0 lb

## 2024-01-21 DIAGNOSIS — I10 Essential (primary) hypertension: Secondary | ICD-10-CM | POA: Diagnosis not present

## 2024-01-21 DIAGNOSIS — Z6838 Body mass index (BMI) 38.0-38.9, adult: Secondary | ICD-10-CM | POA: Diagnosis not present

## 2024-01-21 DIAGNOSIS — R7303 Prediabetes: Secondary | ICD-10-CM

## 2024-01-21 DIAGNOSIS — E559 Vitamin D deficiency, unspecified: Secondary | ICD-10-CM | POA: Diagnosis not present

## 2024-01-21 DIAGNOSIS — Z6839 Body mass index (BMI) 39.0-39.9, adult: Secondary | ICD-10-CM

## 2024-01-21 MED ORDER — VITAMIN D (ERGOCALCIFEROL) 1.25 MG (50000 UNIT) PO CAPS: 50000.0000 [IU] | ORAL_CAPSULE | ORAL | 0 refills | Status: DC

## 2024-01-21 NOTE — Progress Notes (Signed)
 WEIGHT SUMMARY AND BIOMETRICS  Vitals Temp: 99.1 F (37.3 C) BP: 126/84 Pulse Rate: 69 SpO2: 99 %   Anthropometric Measurements Height: 5' 4 (1.626 m) Weight: 226 lb (102.5 kg) BMI (Calculated): 38.77 Weight at Last Visit: 229lb Weight Lost Since Last Visit: 3lb Weight Gained Since Last Visit: 0lb Starting Weight: 236lb Total Weight Loss (lbs): 10 lb (4.536 kg) Peak Weight: 236lb   Body Composition  Body Fat %: 46.2 % Fat Mass (lbs): 104.6 lbs Muscle Mass (lbs): 115.6 lbs Total Body Water (lbs): 76.2 lbs Visceral Fat Rating : 14   Other Clinical Data Fasting: yes Labs: no Today's Visit #: 15 Starting Date: 02/28/23    Chief Complaint:   OBESITY Bonnetta is here to discuss her progress with her obesity treatment plan.  She is on the the Category 1 Plan and states she is following her eating plan approximately 25 % of the time.  She states she is exercising Walking 30 minutes 3 times per week.  Interim History:   Stress-  Her husband stopped working after 01/16/2024 due to worsening bilateral knee and ankle pain. He has been working as a Investment banker, operational > 40 years She is the sole provider for she and her husband. 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  She is still grieving the passing her 61 year old father the is last summer.  She feels that her appetite is suppressed to due to stress, grief, and a hectic schedule.   Subjective:   1. Prediabetes Lab Results  Component Value Date   HGBA1C 6.0 (H) 10/29/2023   HGBA1C 5.8 (H) 06/06/2023   HGBA1C 6.2 (H) 02/28/2023     Latest Reference Range & Units 02/28/23 11:10  INSULIN  2.6 - 24.9 uIU/mL 27.6 (H)  (H): Data is abnormally high  She reports taking daily Metformin  500mg  with breakfast- she denies SE She provided the following food recall typical of a day: Breakfast: 2 eggs with one link of  sausage Lunch: greek yogurt Diner: Meat protein with vegetables Snacks: apples and/or grapes  2. Vitamin D  deficiency  Latest Reference Range & Units 01/11/23 10:57 06/06/23 14:30 10/29/23 14:49  Vitamin D , 25-Hydroxy 30.0 - 100.0 ng/mL 34 65.8 65.4   She is on weekly Ergocalciferol - denies N/V/Muscle Weakness  3. Essential hypertension, benign BP stable and at goal OV She denies CP with exertion She is on  aspirin  EC 81 MG tablet  indapamide  (LOZOL ) 2.5 MG tablet  atorvastatin  (LIPITOR) 10 MG tablet  losartan  (COZAAR ) 100 MG tablet  spironolactone  (ALDACTONE ) 25 MG tablet   Assessment/Plan:   1. Prediabetes Increase compliance on prescribed Cat 1 MP Hand outs provided to pt  2. Vitamin D  deficiency Refill - Vitamin D , Ergocalciferol , (DRISDOL ) 1.25 MG (50000 UNIT) CAPS capsule; Take 1 capsule (50,000 Units total) by mouth every 7 (seven) days.  Dispense: 5 capsule; Refill: 0  3. Essential hypertension, benign Increase compliance on prescribed Cat 1 MP Continue regular walking Continue  aspirin  EC 81 MG tablet  indapamide  (LOZOL ) 2.5 MG tablet  atorvastatin  (LIPITOR) 10 MG tablet  losartan  (COZAAR ) 100 MG tablet  spironolactone  (ALDACTONE ) 25 MG tablet   4. BMI 39.0-39.9,adult Current BMI 38.9 (Primary)  Tehila is not currently in the action stage of change. As such, her goal is to get back to weightloss efforts . She has agreed to the Category 1 Plan.  Exercise goals: All adults should avoid inactivity. Some physical activity is better than none, and adults who participate in any amount of physical activity gain some health benefits. Adults should also include muscle-strengthening activities that involve all major muscle groups on 2 or more days a week.  Behavioral modification strategies: increasing lean protein intake, decreasing simple carbohydrates, increasing vegetables, increasing water intake, no skipping meals, meal planning and cooking strategies, keeping  healthy foods in the home, ways to avoid boredom eating, planning for success, and decreasing junk food.  Gabrianna has agreed to follow-up with our clinic in 4 weeks. She was informed of the importance of frequent follow-up visits to maximize her success with intensive lifestyle modifications for her multiple health conditions.   Objective:   Blood pressure 126/84, pulse 69, temperature 99.1 F (37.3 C), height 5' 4 (1.626 m), weight 226 lb (102.5 kg), SpO2 99%. Body mass index is 38.79 kg/m.  General: Cooperative, alert, well developed, in no acute distress. HEENT: Conjunctivae and lids unremarkable. Cardiovascular: Regular rhythm.  Lungs: Normal work of breathing. Neurologic: No focal deficits.   Lab Results  Component Value Date   CREATININE 0.96 11/26/2023   BUN 17 11/26/2023   NA 138 11/26/2023   K 3.3 (L) 11/26/2023   CL 101 11/26/2023   CO2 22 11/26/2023   Lab Results  Component Value Date   ALT 15 11/06/2023   AST 22 11/06/2023   ALKPHOS 63 07/04/2023   BILITOT 0.5 11/06/2023   Lab Results  Component Value Date   HGBA1C 6.0 (H) 10/29/2023   HGBA1C 5.8 (H) 06/06/2023   HGBA1C 6.2 (H) 02/28/2023   HGBA1C 5.7 08/03/2022   HGBA1C 5.8 09/01/2021   Lab Results  Component Value Date   INSULIN  27.6 (H) 02/28/2023   INSULIN  23.1 08/03/2022   Lab Results  Component Value Date   TSH 1.54 01/11/2023   Lab Results  Component Value Date   CHOL 153 02/28/2023   HDL 54 02/28/2023   LDLCALC 87 02/28/2023   TRIG 60 02/28/2023   CHOLHDL 3.1 04/09/2022   Lab Results  Component Value Date   VD25OH 65.4 10/29/2023   VD25OH 65.8 06/06/2023   VD25OH 34 01/11/2023   Lab Results  Component Value Date   WBC 6.0 11/06/2023   HGB 13.4 11/06/2023   HCT 41.2 11/06/2023   MCV 86.0 11/06/2023   PLT 359 11/06/2023   Lab Results  Component Value Date   IRON 114 05/18/2008   TIBC 300 05/18/2008   FERRITIN 19 05/18/2008   Attestation Statements:   Reviewed by  clinician on day of visit: allergies, medications, problem list, medical history, surgical history, family history, social history, and previous encounter notes.  I have reviewed the above documentation for accuracy and completeness, and I agree with the above. -  Aryahi Denzler d. Zani Kyllonen, NP-C

## 2024-02-04 ENCOUNTER — Other Ambulatory Visit: Payer: Self-pay | Admitting: Physician Assistant

## 2024-02-04 DIAGNOSIS — M329 Systemic lupus erythematosus, unspecified: Secondary | ICD-10-CM

## 2024-02-04 NOTE — Telephone Encounter (Signed)
 Last Fill: 11/11/2023  Eye exam: 02/20/2023 WNL    Labs: 11/06/2023 ANA 1: 640 NS, CMP shows mildly elevated creatinine, patient should increase water intake, urine protein creatinine ratio normal, CBC normal, double-stranded DNA negative, complements normal. Labs do not indicate an autoimmune disease flare. No change in treatment advised.   Next Visit: 05/06/2024  Last Visit: 11/06/2023  IK:Nuyzm systemic lupus erythematosus with other organ involvement   Current Dose per office note 11/06/2023: Plaquenil  200 mg p.o. twice daily   Okay to refill Plaquenil ?

## 2024-02-17 ENCOUNTER — Ambulatory Visit (INDEPENDENT_AMBULATORY_CARE_PROVIDER_SITE_OTHER): Admitting: Family Medicine

## 2024-02-17 ENCOUNTER — Encounter: Payer: Self-pay | Admitting: Family Medicine

## 2024-02-17 ENCOUNTER — Ambulatory Visit
Admission: RE | Admit: 2024-02-17 | Discharge: 2024-02-17 | Disposition: A | Discharge status: Home / Self Care | Source: Ambulatory Visit | Attending: Family Medicine | Admitting: Family Medicine

## 2024-02-17 ENCOUNTER — Ambulatory Visit (HOSPITAL_COMMUNITY)
Admission: RE | Admit: 2024-02-17 | Discharge: 2024-02-17 | Disposition: A | Discharge status: Home / Self Care | Source: Ambulatory Visit | Attending: Family Medicine | Admitting: Family Medicine

## 2024-02-17 VITALS — BP 129/77 | HR 71 | Ht 64.0 in | Wt 230.8 lb

## 2024-02-17 DIAGNOSIS — I10 Essential (primary) hypertension: Secondary | ICD-10-CM | POA: Diagnosis not present

## 2024-02-17 DIAGNOSIS — F411 Generalized anxiety disorder: Secondary | ICD-10-CM | POA: Diagnosis not present

## 2024-02-17 DIAGNOSIS — E876 Hypokalemia: Secondary | ICD-10-CM | POA: Diagnosis not present

## 2024-02-17 DIAGNOSIS — Z0000 Encounter for general adult medical examination without abnormal findings: Secondary | ICD-10-CM | POA: Diagnosis not present

## 2024-02-17 DIAGNOSIS — R0789 Other chest pain: Secondary | ICD-10-CM | POA: Diagnosis not present

## 2024-02-17 DIAGNOSIS — E785 Hyperlipidemia, unspecified: Secondary | ICD-10-CM

## 2024-02-17 DIAGNOSIS — M25512 Pain in left shoulder: Secondary | ICD-10-CM

## 2024-02-17 LAB — D-DIMER, QUANTITATIVE: D-DIMER: <0.2 mg{FEU}/L (ref 0.00–0.49)

## 2024-02-17 NOTE — Assessment & Plan Note (Signed)
 Blood pressure is at goal.  She is tolerating her medications well.  BMP today.

## 2024-02-17 NOTE — Progress Notes (Signed)
 SUBJECTIVE:   CHIEF COMPLAINT: annual exam HPI:   Veronica [name] is a 63 y.o.  with history notable for HTN, SLE, prediabetes presenting for annual exam .   Discussed the use of AI scribe software for clinical note transcription with the patient, who gave verbal consent to proceed.  History of Present Illness Veronica [name] is a 63 year old female with lupus who presents with left shoulder pain and chest pain.  Left shoulder pain - Left shoulder pain present for the past couple of months - Pain primarily occurs with lifting her arm, rolling over at night, or reaching for objects - Hot showers have not provided significant relief - Takes Coricidin (contains Tylenol ) for general aches and pains - History of previous rotator cuff issues  Left-sided chest pain - Intermittent left-sided chest pain began about a week ago - No pain today - Last occurred several days ago  - Pain lasts a day or two at a time - Pain is not associated with exertion, breathing, or palpation - No difficulty breathing, leg swelling, or worsening of pain with walking - Has not taken any medication for chest pain - Chest pain is mild and left of sternum   Sleep disturbance and psychosocial stressors - Works two jobs, including as a Engineer, structural for elderly people - Started second job after primary job reduced her pay - Insufficient sleep due to busy schedule - Increased stress following recent death of her father from pneumonia and kidney disease   PERTINENT  PMH / PSH/Family/Social History : obesity, SLE on Plaquenil    OBJECTIVE:   BP 129/77   Pulse 71   Ht 5' 4 (1.626 m)   Wt 230 lb 12.8 oz (104.7 kg)   SpO2 99%   BMI 39.62 kg/m   Today's weight:  Last Weight  Most recent update: 02/17/2024 11:13 AM    Weight  104.7 kg (230 lb 12.8 oz)            Review of prior weights: Filed Weights   02/17/24 1112  Weight: 230 lb 12.8 oz (104.7 kg)    RRR Lungs clear bilaterally MSK  exam + painful arc on L  No TTP on exam Good strength + Pain with empty can and Neer's on L   ASSESSMENT/PLAN:   Assessment & Plan Essential hypertension, benign Hypokalemia Blood pressure is at goal.  She is tolerating her medications well.  BMP today. Generalized anxiety disorder Supportive listening provided related to recent family loss. Hyperlipidemia, unspecified hyperlipidemia type Lipid panel today Annual physical exam Discussed self-care, dietary habits and exercise.  She follows with healthy weight and wellness. Reports she had her mammogram at [facility]  Atypical chest pain High risk given SLE, HTN, history of possible TIA Discussed ED precautions she will proceed to the emergency room should the condition recur.   EKG today shows sinus rhythm with some possible left ventricular hypertrophy.  No acute ST or T wave changes.  Differential includes anginal equivalent, lupus flare, pulmonary embolism.  Chest x-ray ordered.  Sent stat D-dimer.  Also send CBC to evaluate for anemia as underlying cause.  Given her underlying risk factors I referred her to cardiology. Acute pain of left shoulder Suspect this is rotator cuff tear by history.  Will refer to sports medicine for ultrasound and possible injection.  Offered physical therapy and Voltaren  gel.  She declined at this time. This does seem unrelated to the biggest issue today, her chest pain  [facility] is unavailable today. Instructed to return for RN visit for vaccines.   If d dimer returns positive I specifically discussed she will need to proceed to the ED for CT for PE.   Suzann Daring, MD  Family Medicine Teaching Service  [facility]

## 2024-02-17 NOTE — Assessment & Plan Note (Signed)
 Lipid panel today

## 2024-02-17 NOTE — Assessment & Plan Note (Signed)
 Supportive listening provided related to recent family loss.

## 2024-02-17 NOTE — Patient Instructions (Signed)
 It was wonderful to see you today.  Please bring ALL of your medications with you to every visit.   Today we talked about:  - For your intermittent chest pain - I will message or call you today with results - An x-ray was ordered for you---you do not need an appointment to have this completed.  I recommend going to [facility] [address] [place]  If the results are normal,I will send you a letter  I will call you with results if anything is abnormal   If you have recurrent chest pain, please go to the Emergency Room  You will be called by Cardiology   For your shoulder I think this is a rotator cuff issue  I have referred you to Sports medicien  to further evaluate your concern. If you have not received a phone call about this appointment within 3-4 weeks, please call our office back at [phone]. Margit Dimes coordinates our referrals and can assist you in this.   **  Please follow up in 3 months   Thank you for choosing [facility].   Please call [phone] with any questions about today's appointment.  Please be sure to schedule follow up at the front  desk before you leave today.   Suzann Daring, MD  Family Medicine

## 2024-02-18 ENCOUNTER — Ambulatory Visit (INDEPENDENT_AMBULATORY_CARE_PROVIDER_SITE_OTHER): Admitting: Physician Assistant

## 2024-02-18 ENCOUNTER — Ambulatory Visit: Payer: Self-pay | Admitting: Family Medicine

## 2024-02-18 LAB — CBC
Hematocrit: 39.2 % (ref 34.0–46.6)
Hemoglobin: 12.7 g/dL (ref 11.1–15.9)
MCH: 28.2 pg (ref 26.6–33.0)
MCHC: 32.4 g/dL (ref 31.5–35.7)
MCV: 87 fL (ref 79–97)
Platelets: 367 x10E3/uL (ref 150–450)
RBC: 4.51 x10E6/uL (ref 3.77–5.28)
RDW: 12.2 % (ref 11.7–15.4)
WBC: 4.5 x10E3/uL (ref 3.4–10.8)

## 2024-02-18 LAB — LIPID PANEL
Chol/HDL Ratio: 3 ratio (ref 0.0–4.4)
Cholesterol, Total: 141 mg/dL (ref 100–199)
HDL: 47 mg/dL (ref >39)
LDL Chol Calc (NIH): 79 mg/dL (ref 0–99)
Triglycerides: 75 mg/dL (ref 0–149)
VLDL Cholesterol Cal: 15 mg/dL (ref 5–40)

## 2024-02-18 LAB — BASIC METABOLIC PANEL WITH GFR
BUN/Creatinine Ratio: 14 (ref 12–28)
BUN: 13 mg/dL (ref 8–27)
CO2: 27 mmol/L (ref 20–29)
Calcium: 9.9 mg/dL (ref 8.7–10.3)
Chloride: 102 mmol/L (ref 96–106)
Creatinine, Ser: 0.94 mg/dL (ref 0.57–1.00)
Glucose: 91 mg/dL (ref 70–99)
Potassium: 3.8 mmol/L (ref 3.5–5.2)
Sodium: 139 mmol/L (ref 134–144)
eGFR: 69 mL/min/1.73 (ref >59)

## 2024-02-26 ENCOUNTER — Ambulatory Visit (INDEPENDENT_AMBULATORY_CARE_PROVIDER_SITE_OTHER): Admitting: Physician Assistant

## 2024-02-26 ENCOUNTER — Encounter (INDEPENDENT_AMBULATORY_CARE_PROVIDER_SITE_OTHER): Payer: Self-pay | Admitting: Physician Assistant

## 2024-02-26 VITALS — BP 125/79 | HR 67 | Temp 98.4°F | Ht 64.0 in | Wt 227.0 lb

## 2024-02-26 DIAGNOSIS — E669 Obesity, unspecified: Secondary | ICD-10-CM

## 2024-02-26 DIAGNOSIS — I10 Essential (primary) hypertension: Secondary | ICD-10-CM

## 2024-02-26 DIAGNOSIS — R7303 Prediabetes: Secondary | ICD-10-CM | POA: Diagnosis not present

## 2024-02-26 DIAGNOSIS — R948 Abnormal results of function studies of other organs and systems: Secondary | ICD-10-CM | POA: Diagnosis not present

## 2024-02-26 DIAGNOSIS — Z6839 Body mass index (BMI) 39.0-39.9, adult: Secondary | ICD-10-CM

## 2024-02-26 DIAGNOSIS — E88819 Insulin resistance, unspecified: Secondary | ICD-10-CM

## 2024-02-26 DIAGNOSIS — E559 Vitamin D deficiency, unspecified: Secondary | ICD-10-CM

## 2024-02-26 DIAGNOSIS — E785 Hyperlipidemia, unspecified: Secondary | ICD-10-CM

## 2024-02-26 MED ORDER — METFORMIN HCL 500 MG PO TABS: 500.0000 mg | ORAL_TABLET | Freq: Two times a day (BID) | ORAL | 1 refills | Status: AC

## 2024-02-26 MED ORDER — VITAMIN D (ERGOCALCIFEROL) 1.25 MG (50000 UNIT) PO CAPS
50000.0000 [IU] | ORAL_CAPSULE | ORAL | 0 refills | Status: AC
Start: 2024-02-26 — End: ?

## 2024-02-26 NOTE — Progress Notes (Signed)
 SUBJECTIVE: Discussed the use of AI scribe software for clinical note transcription with the patient, who gave verbal consent to proceed.  Chief Complaint: Obesity  Interim History: She is up 1 lbs since her last visit.  Down 9 lbs overall  TBW loss of 3.8%  Muscle mass -1.2 lbs Adipose mass +2.2 lbs.   Veronica [name] is here to discuss her progress with her obesity treatment plan. She is on the Category 1 Plan and states she is following her eating plan approximately 25 % of the time. She states she is exercising walking 30 minutes 3 times per week. Veronica [name] is a 63 year old female who presents for follow-up of her obesity treatment plan.  She has experienced a weight gain of one pound since her last visit, resulting in a total weight loss of nine pounds. She is following a category one nutrition plan with approximately 25% compliance, focusing on whole foods, adequate protein, and sufficient water intake. She is not skipping meals and sleeps seven to nine hours nightly. She engages in physical activity by walking for thirty minutes three days per week.  She is taking metformin  500 mg daily for prediabetes- off-label use. She has experienced some diarrhea, which she associates with certain foods like salad and starchy foods, but not with sweets. She reported cravings for sweets like Tootsie Roll Pops and noted increased sugar intake recently.  She is on ergocalciferol  50,000 units once weekly for vitamin D  deficiency, taken on Thursdays. She is also on atorvastatin  10 mg daily for hyperlipidemia.  For hypertension and edema management, she takes losartan  100 mg daily, losartan  2.5 mg daily, and spironolactone  25 mg daily.  She works two jobs, including caregiving for two different clients, which impacts her meal timing and appetite. She typically eats eggs for breakfast and a salad with chicken wings for lunch, sometimes supplemented with Greek yogurt.  OBJECTIVE: Visit  Diagnoses: Problem List Items Addressed This Visit     Hyperlipidemia   Prediabetes   Relevant Medications   metFORMIN  (GLUCOPHAGE ) 500 MG tablet   Vitamin D  deficiency   Relevant Medications   Vitamin D , Ergocalciferol , (DRISDOL ) 1.25 MG (50000 UNIT) CAPS capsule   Essential hypertension, benign (Chronic)   Other Visit Diagnoses       Low basal metabolic rate    -  Primary     Insulin  resistance         Obesity with starting BMI of 40.5       Relevant Medications   metFORMIN  (GLUCOPHAGE ) 500 MG tablet     BMI 39.0-39.9,adult Current BMI 39.0         Obesity with very low metabolic rate She has gained one pound since the last visit, resulting in a total weight loss of nine pounds. She is following a category one nutrition plan with approximately 25% compliance, focusing on whole foods, adequate protein, and hydration. She is not skipping meals, sleeps 7-9 hours nightly, and walks for 30 minutes three days per week. Consumption of Tootsie Roll Pops may have contributed to the weight gain. Metformin  500 mg daily is being taken for prediabetes, which may aid in weight management. Increasing metformin  to 500 mg twice daily may help manage evening cravings. Very low REE- 994  - Increase metformin  to 500 mg twice daily, taking the second dose with the evening meal. - Continue current lifestyle modifications including diet and exercise. - Avoid sugary snacks like Tootsie Roll Pops and find healthier alternatives.  Prediabetes Currently  on metformin  500 mg daily. Increasing the dose to 500 mg twice daily may help manage evening cravings. She prefers tablet forms of medication. No GI side effects with metformin   Lab Results  Component Value Date   HGBA1C 6.0 (H) 10/29/2023   HGBA1C 5.8 (H) 06/06/2023   HGBA1C 6.2 (H) 02/28/2023   Lab Results  Component Value Date   MICROALBUR 1.27 11/28/2009   LDLCALC 79 02/17/2024   CREATININE 0.94 02/17/2024   INSULIN   Date Value Ref Range  Status  02/28/2023 27.6 (H) 2.6 - 24.9 uIU/mL Final  }HOMA IR of 6.2- significant elevation.  Plan:  Continue working on nutrition plan to decrease simple carbohydrates, increase lean proteins and exercise to promote weight loss, improve glycemic control and prevent progression to Type 2 diabetes.  - Increase metformin  to 500 mg twice daily, taking the second dose with the evening meal. - Monitor for persistent diarrhea or other side effects; if they occur, revert to once daily dosing. Meds ordered this encounter  Medications   metFORMIN  (GLUCOPHAGE ) 500 MG tablet    Sig: Take 1 tablet (500 mg total) by mouth 2 (two) times daily with a meal.    Dispense:  60 tablet    Refill:  1   Vitamin D , Ergocalciferol , (DRISDOL ) 1.25 MG (50000 UNIT) CAPS capsule    Sig: Take 1 capsule (50,000 Units total) by mouth every 7 (seven) days.    Dispense:  5 capsule    Refill:  0    Hypertension Blood pressure is well-controlled at 125/79 mmHg. She is taking losartan  100 mg daily, losartan  2.5 mg daily, and spironolactone  25 mg daily for hypertension and edema management. BP Readings from Last 3 Encounters:  02/26/24 125/79  02/17/24 129/77  01/21/24 126/84   Lab Results  Component Value Date   NA 139 02/17/2024   CL 102 02/17/2024   K 3.8 02/17/2024   CO2 27 02/17/2024   BUN 13 02/17/2024   CREATININE 0.94 02/17/2024   EGFR 69 02/17/2024   CALCIUM  9.9 02/17/2024   ALBUMIN 4.1 07/04/2023   GLUCOSE 91 02/17/2024   Continue to work on nutrition plan to promote weight loss and improve BP control.  - Continue current antihypertensive regimen.  Hyperlipidemia She is on atorvastatin  10 mg daily with no reported issues. Last lipids Lab Results  Component Value Date   CHOL 141 02/17/2024   HDL 47 02/17/2024   LDLCALC 79 02/17/2024   TRIG 75 02/17/2024   CHOLHDL 3.0 02/17/2024   Continue to work on nutrition plan -decreasing simple carbohydrates, increasing lean proteins, decreasing  saturated fats and cholesterol , avoiding trans fats and exercise as able to promote weight loss, improve lipids and decrease cardiovascular risks. - Continue atorvastatin  10 mg daily.  Vitamin D  Deficiency She is compliant with ergocalciferol  50,000 units once weekly, taken on Thursdays. No N/V or muscle weakness with Ergocalciferol .  Last vitamin D  Lab Results  Component Value Date   VD25OH 65.4 10/29/2023   Low vitamin D  levels can be associated with adiposity and may result in leptin resistance and weight gain. Also associated with fatigue.  Currently on vitamin D  supplementation without any adverse effects such as nausea, vomiting or muscle weakness.  - Continue/refill ergocalciferol  50,000 units once weekly.  General Health Maintenance Working two jobs may impact her eating schedule. Emphasized protein intake to prevent muscle loss during weight loss. Encouraged to focus on protein intake and avoid sugary snacks, with more protein at lunch and alternatives like yogurt. -  Focus on increasing protein intake, especially at lunch. - Avoid sugary snacks and find healthier alternatives.  Vitals Temp: 98.4 F (36.9 C) BP: 125/79 Pulse Rate: 67 SpO2: 99 %   Anthropometric Measurements Height: 5' 4 (1.626 m) Weight: 227 lb (103 kg) BMI (Calculated): 38.95 Weight at Last Visit: 226 lb Weight Lost Since Last Visit: 0 Weight Gained Since Last Visit: 1 lb Starting Weight: 236 lb Total Weight Loss (lbs): 9 lb (4.082 kg) Peak Weight: 236 lb   Body Composition  Body Fat %: 47 % Fat Mass (lbs): 106.8 lbs Muscle Mass (lbs): 114.4 lbs Total Body Water (lbs): 78 lbs Visceral Fat Rating : 15   Other Clinical Data Fasting: No Labs: No Today's Visit #: 16 Starting Date: 02/28/23     ASSESSMENT AND PLAN:  Diet: Marji is currently in the action stage of change. As such, her goal is to continue with weight loss efforts. She has agreed to Category 1 Plan.  Exercise: Courtnei  has been instructed to try a geriatric exercise plan and that some exercise is better than none for weight loss and overall health benefits.   Behavior Modification:  We discussed the following Behavioral Modification Strategies today: increasing lean protein intake, decreasing simple carbohydrates, increasing vegetables, increase H2O intake, increase high fiber foods, no skipping meals, meal planning and cooking strategies, better snacking choices, avoiding temptations, and planning for success. We discussed various medication options to help [facility] with her weight loss efforts and we both agreed to continue current treatment plan.  Return in about 3 weeks (around 03/18/2024).SABRA She was informed of the importance of frequent follow up visits to maximize her success with intensive lifestyle modifications for her multiple health conditions.  Attestation Statements:   Reviewed by clinician on day of visit: allergies, medications, problem list, medical history, surgical history, family history, social history, and previous encounter notes.   Time spent on visit including pre-visit chart review and post-visit care and charting was 30 minutes.    Carletha Dawn, PA-C

## 2024-03-13 NOTE — Progress Notes (Signed)
 [facility]:    Date:  03/16/2024   ID:  Veronica [name], DOB 1960-12-25, MRN [id number]  PCP:  Veronica Suzann HERO, MD  Cardiologist:  None Cardiology APP:  Veronica Manna E, PA-C     Referring MD: Veronica Suzann HERO, MD   Chief Complaint: chest pain   History of Present Illness:    Veronica [name] is a 63 y.o. female with a history of hypertension, hyperlipidemia, prediabetes, TIA in 11/2019, lupus, rheumatoid arthritis, fibromyalgia, G6PD deficiency, and anxiety/ depression who presents today as a new patient in the [facility] for further evaluation of chest pain.   Patient was seen in the ED in 11/2019 for possible TIA after presenting with left sided facial droop and slurred speech. Head CT and Brain MRI were unremarkable. She was subsequent seen by Neurology. Head/ neck CTA in 12/2019 showed no acute findings. Echo in 04/2020 showed LVEF of 60-65% with mild LVH, mild TR and mild PR.  Monitor in 04/2020 showed rare PAC/s PVCs but no atrial fibrillation or any other significant arrhythmias.   Repeat Echo in 05/2023 showed LVEF of 60-65% with moderate asymmetric LVH of the basal-septal segment and normal diastolic parameters, normal RV function, and no significant valvular disease.   She was seen by PCP in 01/2024 and reported intermittent left sided chest pain. EKG showed normal sinus rhythm, rate 71 bpm, with LVH but no acute ischemic changes. She was referred to Cardiology for further evaluation.   Patient initially reported that she was not sure why she was here.  However, upon further questioning, she reported that she was having intermittent chest pain for a couple weeks a while ago but could not remember exactly when this was.  She describes pleuritic pain at that time and states she was only having pain when breathing in and out. D-dimer in 01/2024 was negative.  She denies any other chest pain at that time and states pain has since completely resolved.  She  was under a lot of stress at that time as her father had recently died her husband had lost his job.  She also reports some left arm pain and describes this as a throbbing sensation of her whole arm.  It is worse with movement of her arm and states she mostly has it when she is sleeping.  She states that will wake her up from sleep and she has to hang her arm over the side of her bed.  She has a history of her rotator cuff injury on the right side and states this feels the exact same.  She is scheduled to see Ortho soon.  She also reports random very brief episodes of lightheadedness/dizziness that are not related to position changes.  She attributes this to dehydration.  She denies any palpitations, falls, or syncope.  [facility] and is trying to lose weight.  She denies any history of tobacco, alcohol, or drug use.  She also denies any family history of cardiovascular disease (CAD, CHF, CVA).   ROS: No shortness of breath, orthopnea, PND, lower extremity edema, palpitations, syncope.    EKGs/Labs/Other Studies Reviewed:    The following studies were reviewed:  Monitor 05/24/2020 to 06/05/2020: Patient had a min HR of 45 bpm, max HR of 107 bpm, and avg HR of 65 bpm. Predominant underlying rhythm was Sinus Rhythm.  Isolated SVEs were rare (<1.0%), SVE Couplets were rare (<1.0%), and SVE  Triplets were rare (<1.0%). Symptoms = 1 correlated with PAC. There were Isolated VEs were rare (<1.0%. No atrial fibrillation or significant heart block noted. _______________  Echocardiogram 05/16/2023: Impressions: 1. Left ventricular ejection fraction, by estimation, is 60 to 65%. The  left ventricle has normal function. The left ventricle has no regional  wall motion abnormalities. There is moderate asymmetric left ventricular  hypertrophy of the basal-septal  segment. Left ventricular diastolic parameters were normal.   2. Right ventricular  systolic function is normal. The right ventricular  size is normal. There is normal pulmonary artery systolic pressure. The  estimated right ventricular systolic pressure is 28.4 mmHg.   3. The mitral valve is normal in structure. No evidence of mitral valve  regurgitation. No evidence of mitral stenosis.   4. The aortic valve is tricuspid. Aortic valve regurgitation is not  visualized. No aortic stenosis is present.   5. The inferior vena cava is normal in size with greater than 50%  respiratory variability, suggesting right atrial pressure of 3 mmHg.   EKG:  EKG not ordered today. Recent EKG from 02/17/2024 was personally reviewed and showed normal sinus rhythm, rate 71 bpm, with LVH but no acute ST/ T changes.  Recent Labs: 11/06/2023: ALT 15 02/17/2024: BUN 13; Creatinine, Ser 0.94; Hemoglobin 12.7; Platelets 367; Potassium 3.8; Sodium 139  Recent Lipid Panel    Component Value Date/Time   CHOL 141 02/17/2024 1246   TRIG 75 02/17/2024 1246   HDL 47 02/17/2024 1246   CHOLHDL 3.0 02/17/2024 1246   CHOLHDL 4.4 06/13/2016 1436   VLDL 30 06/13/2016 1436   LDLCALC 79 02/17/2024 1246    Physical Exam:    Vital Signs: BP 118/78   Pulse 80   Ht 5' 4 (1.626 m)   Wt 232 lb 4.8 oz (105.4 kg)   SpO2 100%   BMI 39.87 kg/m     Wt Readings from Last 3 Encounters:  03/16/24 232 lb 4.8 oz (105.4 kg)  02/26/24 227 lb (103 kg)  02/17/24 230 lb 12.8 oz (104.7 kg)     General: 63 y.o. obese African-American female in no acute distress. HEENT: Normocephalic and atraumatic. Sclera clear.  Neck: Supple. No carotid bruits. No JVD. Heart: RRR. Distinct S1 and S2. No murmurs, gallops, or rubs.  Lungs: No increased work of breathing. Clear to ausculation bilaterally. No wheezes, rhonchi, or rales.  Extremities: No lower extremity edema.   Skin: Warm and dry. Neuro: No focal deficits. Psych: Normal affect. Responds appropriately.  Assessment:    1. Precordial pain   2. LVH (left  ventricular hypertrophy)   3. Primary hypertension   4. Hyperlipidemia, unspecified hyperlipidemia type   5. Prediabetes   6. Obesity (BMI 35.0-39.9 without comorbidity)     Plan:    Chest Pain Patient reports intermittent chest pain a few weeks ago that was pleuritic in nature.  D-dimer was negative. EKG at PCP's office in 02/17/2024 at PCP's office showed normal sinus rhythm with PVC but no acute ischemic changes.  - She denies any recurrent chest pain.  She does describe some left arm pain but this sounds musculoskeletal in nature. - Symptoms sound atypical but she has a multiple cardiovascular risk factors including hypertension, hyperlipidemia, prediabetes, and prior history of TIA.  Therefore, we will order a coronary CTA to rule out obstructive CAD.  Will provide a one-time dose of Lopressor 100 mg for patient to take 2 hours prior to CTA.  Renal function looked good on recent  lab work on 02/17/2024 (creatinine 0.94 and GFR 69).  Moderate LVH Echo in 05/2023 showed LVEF of 60-65% with moderate asymmetric LVH of the basal septal segment but normal diastolic parameters. - No signs or symptoms of CHF. - Suspect this is likely due to hypertension and she states she was diagnosed with this in her 52s.  She denies any family history of cardiomyopathy or sudden cardiac death.  Hypertension BP well controlled. - Continue current medications: Losartan  100mg  daily and Spironolactone  25mg  daily.   Hyperlipidemia Lipid panel in 01/2024: Total Cholesterol 141, Triglycerides 75, HDL 47, LDL 79.  - Continue Lipitor 10mg  daily.   Prediabetes Hemoglobin A1 6.0% in 10/2023.  - On Metformin .  - Management per PCP.   Obesity BMI 39.87.  - She is currently working with [facility].  Disposition: Follow up in 2 months after coronary CTA.   Signed, Aline FORBES Door, PA-C  03/16/2024 12:30 PM    [facility]

## 2024-03-15 NOTE — Progress Notes (Unsigned)
 SUBJECTIVE: Discussed the use of AI scribe software for clinical note transcription with the patient, who gave verbal consent to proceed.  Chief Complaint: Obesity  Interim History: She has maintained her weight since her last visit. Down 9 lbs overall TBW loss of 3.8%  Muscle mass + 0.4 lbs Adipose mass unchanged.   Veronica [name] is here to discuss her progress with her obesity treatment plan. She is on the Category 1 Plan and states she is following her eating plan approximately 50 % of the time. She states she is exercising walking 30 minutes walking 3 times per week.  Veronica [name] is a 63 year old female who presents for follow-up of her obesity treatment plan.  She has a history of prediabetes, insulin  resistance, hyperlipidemia, vitamin D  deficiency, and a low baseline metabolic rate. She has lost nine pounds over the past year,  but notes no significant changes recently.  She is eating more whole foods, ensuring adequate protein intake, and drinking sufficient water. She sleeps seven to nine hours per night and walks for thirty minutes three times a week.  She follows a category one plan approximately fifty percent of the time, but does note she is not very hungry in the evenings after working 2 jobs and does skip meals at times.  Her current medications include metformin  500 mg twice daily, topiramate  50 mg at bedtime, Plaquenil  for arthritis, ergocalciferol  50,000 units once weekly for vitamin D  deficiency, atorvastatin  10 mg daily for hyperlipidemia, losartan  100 mg daily, and indapamide  2.5 mg daily for blood pressure management.  She feels discouraged due to the slow progress in weight loss. Her appetite is often low, especially during lunch or dinner, due to her busy work schedule, which includes two jobs. She sometimes skips meals because she is not hungry and prefers to rest instead. She focuses on consuming protein-rich foods like chicken, fish, turkey, eggs, and occasionally  sausage and toast, but avoids hamburger due to diverticulitis.  She is concerned about not drinking enough water and tries to maintain hydration by drinking water throughout the day. She aims to increase her water intake to at least 64 ounces daily. She also includes yogurt and grapes in her diet, being cautious not to overconsume grapes.  She is curious about other medications to help promote weight loss. We discussed GLP-1/GIP medications.  No family history of medullary thyroid cancer. She works two jobs, which impacts her meal schedule and appetite. She engages in walking for exercise. No history of pancreatitis and no current gastrointestinal symptoms from medications.  OBJECTIVE: Visit Diagnoses: Problem List Items Addressed This Visit     Hyperlipidemia   Prediabetes   Vitamin D  deficiency   Other Visit Diagnoses       Low basal metabolic rate    -  Primary     Insulin  resistance         Obesity with starting BMI of 40.5         BMI 39.0-39.9,adult Current BMI 39.1          Obesity with low metabolic rate Obesity with a low metabolic rate of 005 calories per day, contributing to difficulty in weight loss. Current weight loss is 9 pounds, equating to a 3.8% reduction in body weight.  Discussed potential use of Wegovy  for weight loss, which could result in up to a 15% total body weight loss.  Concerns about appetite suppression with Wegovy  due to current low appetite and potential for further reduction in caloric  intake.  Discussed alternative medication, Zepbound, which may offer greater weight loss but at a higher cost.  Emphasized the importance of maintaining adequate caloric intake and protein consumption to support metabolism and muscle building. - Continue current lifestyle modifications including walking and dietary changes. - Consider protein shakes to ensure adequate protein intake. - Will reassess metabolism study early next year. - Will discuss potential use of Wegovy   or Zepbound if appetite and caloric intake improve.  Prediabetes and insulin  resistance Managed with metformin  500 mg twice daily. Current management is well-tolerated without gastrointestinal side effects. Discussed potential benefits of Wegovy  in improving insulin  resistance and prediabetes. A1c and insulin  levels not at goals.  Lab Results  Component Value Date   HGBA1C 6.0 (H) 10/29/2023   HGBA1C 5.8 (H) 06/06/2023   HGBA1C 6.2 (H) 02/28/2023   Lab Results  Component Value Date   MICROALBUR 1.27 11/28/2009   LDLCALC 79 02/17/2024   CREATININE 0.94 02/17/2024   INSULIN   Date Value Ref Range Status  02/28/2023 27.6 (H) 2.6 - 24.9 uIU/mL Final  ]Continue working on nutrition plan to decrease simple carbohydrates, increase lean proteins and exercise to promote weight loss, improve glycemic control and prevent progression to Type 2 diabetes.  - Continue metformin  500 mg twice daily.  Hyperlipidemia Managed with atorvastatin  10 mg daily. Discussed potential benefits of Wegovy  in addressing hyperlipidemia. No reported SE with statin Last lipids Lab Results  Component Value Date   CHOL 141 02/17/2024   HDL 47 02/17/2024   LDLCALC 79 02/17/2024   TRIG 75 02/17/2024   CHOLHDL 3.0 02/17/2024   Continue to work on nutrition plan -decreasing simple carbohydrates, increasing lean proteins, decreasing saturated fats and cholesterol , avoiding trans fats and exercise as able to promote weight loss, improve lipids and decrease cardiovascular risks. - Continue atorvastatin  10 mg daily.  Vitamin D  deficiency Managed with ergocalciferol  50,000 units once weekly. No N/V or muscle weakness with Ergocalciferol  Last vitamin D  Lab Results  Component Value Date   VD25OH 65.4 10/29/2023   Low vitamin D  levels can be associated with adiposity and may result in leptin resistance and weight gain. Also associated with fatigue.  Currently on vitamin D  supplementation without any adverse effects such  as nausea, vomiting or muscle weakness.  - Continue ergocalciferol  50,000 units once weekly. No refills needed.  Vitals Temp: 98.1 F (36.7 C) BP: 118/76 Pulse Rate: 66 SpO2: 100 %   Anthropometric Measurements Height: 5' 4 (1.626 m) Weight: 227 lb (103 kg) BMI (Calculated): 38.95 Weight at Last Visit: 227 lb Weight Lost Since Last Visit: 0 Weight Gained Since Last Visit: 0 Starting Weight: 236 lb Total Weight Loss (lbs): 9 lb (4.082 kg) Peak Weight: 236 lb   Body Composition  Body Fat %: 46.9 % Fat Mass (lbs): 106.8 lbs Muscle Mass (lbs): 114.8 lbs Total Body Water (lbs): 78.4 lbs Visceral Fat Rating : 15   Other Clinical Data Fasting: No Labs: No Today's Visit #: 17 Starting Date: 02/28/23     ASSESSMENT AND PLAN:  Diet: Veronica [name] is currently in the action stage of change. As such, her goal is to continue with weight loss efforts. She has agreed to Category 1 Plan.  Exercise: Veronica [name] has been instructed to work up to a goal of 150 minutes of combined cardio and strengthening exercise per week, to try a geriatric exercise plan, and try a chair exercise plan for weight loss and overall health benefits.   Behavior Modification:  We discussed the  following Behavioral Modification Strategies today: increasing lean protein intake, decreasing simple carbohydrates, increasing vegetables, increase H2O intake, increase high fiber foods, no skipping meals, meal planning and cooking strategies, holiday eating strategies, avoiding temptations, and planning for success. We discussed various medication options to help Veronica [name] with her weight loss efforts and we both agreed to continue metformin  for primary indication of prediabetes/insulin  resistance and continue to work on nutritional and behavioral strategies to promote weight loss.  .  Return in about 4 weeks (around 04/13/2024).Veronica [name] She was informed of the importance of frequent follow up visits to maximize her success with  intensive lifestyle modifications for her multiple health conditions.  Attestation Statements:   Reviewed by clinician on day of visit: allergies, medications, problem list, medical history, surgical history, family history, social history, and previous encounter notes.   Time spent on visit including pre-visit chart review and post-visit care and charting was 38 minutes.    Veronica Hollenkamp, PA-C

## 2024-03-16 ENCOUNTER — Ambulatory Visit (INDEPENDENT_AMBULATORY_CARE_PROVIDER_SITE_OTHER): Admitting: Physician Assistant

## 2024-03-16 ENCOUNTER — Ambulatory Visit: Attending: Student | Admitting: Student

## 2024-03-16 ENCOUNTER — Encounter: Payer: Self-pay | Admitting: Student

## 2024-03-16 ENCOUNTER — Encounter (INDEPENDENT_AMBULATORY_CARE_PROVIDER_SITE_OTHER): Payer: Self-pay | Admitting: Physician Assistant

## 2024-03-16 VITALS — BP 118/76 | HR 66 | Temp 98.1°F | Ht 64.0 in | Wt 227.0 lb

## 2024-03-16 VITALS — BP 118/78 | HR 80 | Ht 64.0 in | Wt 232.3 lb

## 2024-03-16 DIAGNOSIS — R7303 Prediabetes: Secondary | ICD-10-CM | POA: Diagnosis not present

## 2024-03-16 DIAGNOSIS — R948 Abnormal results of function studies of other organs and systems: Secondary | ICD-10-CM

## 2024-03-16 DIAGNOSIS — I10 Essential (primary) hypertension: Secondary | ICD-10-CM

## 2024-03-16 DIAGNOSIS — Z6839 Body mass index (BMI) 39.0-39.9, adult: Secondary | ICD-10-CM

## 2024-03-16 DIAGNOSIS — R072 Precordial pain: Secondary | ICD-10-CM | POA: Diagnosis not present

## 2024-03-16 DIAGNOSIS — I517 Cardiomegaly: Secondary | ICD-10-CM

## 2024-03-16 DIAGNOSIS — E669 Obesity, unspecified: Secondary | ICD-10-CM

## 2024-03-16 DIAGNOSIS — E88819 Insulin resistance, unspecified: Secondary | ICD-10-CM

## 2024-03-16 DIAGNOSIS — E785 Hyperlipidemia, unspecified: Secondary | ICD-10-CM | POA: Diagnosis not present

## 2024-03-16 DIAGNOSIS — E559 Vitamin D deficiency, unspecified: Secondary | ICD-10-CM

## 2024-03-16 MED ORDER — METOPROLOL TARTRATE 100 MG PO TABS: 100.0000 mg | ORAL_TABLET | Freq: Once | ORAL | 0 refills | Status: DC

## 2024-03-16 NOTE — Patient Instructions (Signed)
 Medication Instructions:  Your physician recommends that you continue on your current medications as directed. Please refer to the Current Medication list given to you today.  *If you need a refill on your cardiac medications before your next appointment, please call your pharmacy*  Lab Work: None ordered  If you have labs (blood work) drawn today and your tests are completely normal, you will receive your results only by: MyChart Message (if you have MyChart) OR A paper copy in the mail If you have any lab test that is abnormal or we need to change your treatment, we will call you to review the results.  Testing/Procedures: Your physician has requested that you have cardiac CT. Cardiac computed tomography (CT) is a painless test that uses an x-ray machine to take clear, detailed pictures of your heart. For further information please visit [email or web address] Please follow instruction sheet BELOW:    Your cardiac CT will be scheduled at one of the below locations:   [facility] [address] [place], [place] [address] [phone] (Severe contrast allergies only)  OR   [facility] [address] [place], [place] [address] [phone]  OR   [facility] [address] [place], [place] [address] [phone]  OR   [facility] Tower [address]  [place], [place] [address]  OR   [facility] [address] [place], [place] [phone]  If scheduled at [facility], please arrive at the [facility] and Children's Entrance (Entrance C2) of [facility] 30 minutes prior to test start time. You can use the FREE valet parking offered at entrance C (encouraged to control the heart rate for the test)  Proceed to the [facility] Radiology Department (first floor) to check-in and test prep.  All radiology patients and guests should use entrance C2 at [facility], accessed  from [facility], even though the hospital's physical address listed is [address]  If scheduled at the Heart and Vascular Tower at [address], please enter the parking lot using the [address] entrance and use the FREE valet service at the patient drop-off area. Enter the building and check-in with registration on the main floor.  If scheduled at [facility], please arrive to the Heart and Vascular Center 15 mins early for check-in and test prep.  There is spacious parking and easy access to the radiology department from the [facility] V[facility] entrance. Please enter here and check-in with the desk attendant.   If scheduled at [facility], please arrive 30 minutes early for check-in and test prep.  Please follow these instructions carefully (unless otherwise directed):  An IV will be required for this test and Nitroglycerin will be given.   On the Night Before the Test: Be sure to Drink plenty of water. Do not consume any caffeinated/decaffeinated beverages or chocolate 12 hours prior to your test. Do not take any antihistamines 12 hours prior to your test.    On the Day of the Test: Drink plenty of water until 1 hour prior to the test. Do not eat any food 1 hour prior to test. You may take your regular medications prior to the test.  Take metoprolol (Lopressor) 100 MG  two hours prior to test. THIS HAS BEEN SENT TO [id number]  HOLD  Spironolactone  Patients who wear a continuous glucose monitor MUST remove the device prior to scanning. FEMALES- please wear  underwire-free bra if available, avoid dresses & tight clothing       After the Test: Drink plenty of water. After receiving IV contrast, you may experience a mild flushed feeling. This is normal. On occasion, you may experience a mild rash up to 24 hours after the test. This is not dangerous. If this occurs, you can take Benadryl  25 mg, Zyrtec , Claritin, or  Allegra  and increase your fluid intake. (Patients taking Tikosyn should avoid Benadryl , and may take Zyrtec , Claritin, or Allegra ) If you experience trouble breathing, this can be serious. If it is severe call 911 IMMEDIATELY. If it is mild, please call our office.  We will call to schedule your test 2-4 weeks out understanding that some insurance companies will need an authorization prior to the service being performed.   For more information and frequently asked questions, please visit our website : [email or web address]  For non-scheduling related questions, please contact the cardiac imaging nurse navigator should you have any questions/concerns: Cardiac Imaging Nurse Navigators Direct Office Dial: [phone]   For scheduling needs, including cancellations and rescheduling, please call Brittany, [phone].   Follow-Up: At [facility], you and your health needs are our priority.  As part of our continuing mission to provide you with exceptional heart care, our providers are all part of one team.  This team includes your primary Cardiologist (physician) and Advanced Practice Providers or APPs (Physician Assistants and Nurse Practitioners) who all work together to provide you with the care you need, when you need it.  Your next appointment:   2 month(s)  Provider:   Callie Goodrich, PA-C          We recommend signing up for the patient portal called MyChart.  Sign up information is provided on this After Visit Summary.  MyChart is used to connect with patients for Virtual Visits (Telemedicine).  Patients are able to view lab/test results, encounter notes, upcoming appointments, etc.  Non-urgent messages can be sent to your provider as well.   To learn more about what you can do with MyChart, go to [email or web address].   Other Instructions

## 2024-03-23 ENCOUNTER — Other Ambulatory Visit: Payer: Self-pay | Admitting: *Deleted

## 2024-03-23 DIAGNOSIS — S46812A Strain of other muscles, fascia and tendons at shoulder and upper arm level, left arm, initial encounter: Secondary | ICD-10-CM

## 2024-03-23 MED ORDER — CYCLOBENZAPRINE HCL 5 MG PO TABS: 5.0000 mg | ORAL_TABLET | Freq: Three times a day (TID) | ORAL | 1 refills | Status: AC | PRN

## 2024-03-30 ENCOUNTER — Ambulatory Visit: Admitting: Family Medicine

## 2024-03-30 ENCOUNTER — Other Ambulatory Visit: Payer: Self-pay | Admitting: *Deleted

## 2024-03-30 VITALS — BP 118/73 | Ht 64.0 in | Wt 229.0 lb

## 2024-03-30 DIAGNOSIS — I10 Essential (primary) hypertension: Secondary | ICD-10-CM

## 2024-03-30 DIAGNOSIS — M25512 Pain in left shoulder: Secondary | ICD-10-CM

## 2024-03-30 MED ORDER — MELOXICAM 15 MG PO TABS: 15.0000 mg | ORAL_TABLET | Freq: Every day | ORAL | 1 refills | Status: AC

## 2024-03-30 MED ORDER — INDAPAMIDE 2.5 MG PO TABS: 2.5000 mg | ORAL_TABLET | Freq: Every day | ORAL | 2 refills | Status: AC

## 2024-03-30 NOTE — Patient Instructions (Signed)
 You have rotator cuff impingement Try to avoid painful activities (overhead activities, lifting with extended arm) as much as possible. Meloxicam  15mg  daily with food for pain and inflammation - take for 7 days then as needed. Can take tylenol  in addition to this. Subacromial injection may be beneficial to help with pain and to decrease inflammation. Consider physical therapy with transition to home exercise program. Do home exercise program with theraband and scapular stabilization exercises daily 3 sets of 10 once a day. If not improving at follow-up we will consider further imaging, injection, physical therapy, and/or nitro patches. Follow up with me in 6 weeks but call me sooner if you're struggling (or send mychart message).

## 2024-03-30 NOTE — Progress Notes (Unsigned)
 PCP: Veronica Suzann HERO, MD  Patient is a 63 y.o. female here for left shoulder pain.  HPI  Left shoulder pain For approximately the past month, the patient has been having left shoulder pain. She is not sure when the pain started or if it was gradual or sudden, but she first noticed it when reaching up to get something. She has no history of left shoulder injuries or surgeries. The pain is aching and dull. It is worse at night, it wakes her up.  She sleeps on the opposite (right) side as the left side hurts for sleeping. Does experience some relief with stretching of the arm by hanging it off the bed. Patient denies weakness of her LLE, tingling, numbness, and pain radiating down the arm. She has been taking acetaminophen  with some relief. Patient works as an engineer, manufacturing. Patient reports history of prior right rotator cuff injury, for which she received an injection.  Past Medical History:  Diagnosis Date   Anxiety    Bilateral swelling of feet    Chest pain    Chewing difficulty    Constipation    Daily headache    Depression    Diverticulitis    Fibromyalgia    G6PD deficiency    outside labs--low level, no longer on HCQ   Glucose 6 phosphatase deficiency (HCC)    High cholesterol    History of cholelithiasis    History of colon polyps    Hypertension    Joint pain    Mini stroke    Osteoarthritis    Prediabetes    Rheumatoid arthritis (HCC)    SLE (systemic lupus erythematosus) (HCC) 1998   SOB (shortness of breath)    Swallowing difficulty     Current Outpatient Medications on File Prior to Visit  Medication Sig Dispense Refill   aspirin  EC 81 MG tablet Take 1 tablet (81 mg total) by mouth daily. Swallow whole. 30 tablet 11   atorvastatin  (LIPITOR) 10 MG tablet Take 1 tablet (10 mg total) by mouth daily. 90 tablet 3   cetirizine  (ZYRTEC ) 10 MG tablet Take 1 tablet (10 mg total) by mouth daily. 30 tablet 11   cyclobenzaprine  (FLEXERIL ) 5 MG tablet Take 1  tablet (5 mg total) by mouth 3 (three) times daily as needed for muscle spasms. 30 tablet 1   FLUoxetine  (PROZAC ) 20 MG capsule Take 1 capsule (20 mg total) by mouth daily. 90 capsule 3   hydroxychloroquine  (PLAQUENIL ) 200 MG tablet Take 1 tablet by mouth twice daily 180 tablet 0   losartan  (COZAAR ) 100 MG tablet Take 1 tablet (100 mg total) by mouth daily. 90 tablet 3   metFORMIN  (GLUCOPHAGE ) 500 MG tablet Take 1 tablet (500 mg total) by mouth 2 (two) times daily with a meal. 60 tablet 1   metoprolol  tartrate (LOPRESSOR ) 100 MG tablet Take 1 tablet (100 mg total) by mouth once for 1 dose. Take 90-120 minutes prior to scan. Hold for SBP less than 110. 1 tablet 0   nystatin  (MYCOSTATIN /NYSTOP ) powder Apply 1 Application topically 2 (two) times daily.     pantoprazole  (PROTONIX ) 40 MG tablet Take 1 tablet by mouth once daily 60 tablet 0   polyethylene glycol powder (GLYCOLAX /MIRALAX ) 17 GM/SCOOP powder Take 17 g by mouth 2 (two) times daily as needed. 3350 g 1   Prenatal Vit-Fe Fumarate-FA (PRENATAL VITAMINS PO) Take by mouth.     spironolactone  (ALDACTONE ) 25 MG tablet Take 1 tablet (25 mg total) by mouth daily. 90  tablet 3   topiramate  (TOPAMAX ) 50 MG tablet Take 1 tablet (50 mg total) by mouth at bedtime. 90 tablet 3   Vitamin D , Ergocalciferol , (DRISDOL ) 1.25 MG (50000 UNIT) CAPS capsule Take 1 capsule (50,000 Units total) by mouth every 7 (seven) days. 5 capsule 0   No current facility-administered medications on file prior to visit.    Past Surgical History:  Procedure Laterality Date   ABDOMINAL HYSTERECTOMY  09/2008   CESAREAN SECTION  1992, 1995   CHOLECYSTECTOMY N/A 07/18/2012   Procedure: LAPAROSCOPIC CHOLECYSTECTOMY WITH INTRAOPERATIVE CHOLANGIOGRAM;  Surgeon: Camellia CHRISTELLA Blush, MD;  Location: [facility] OR;  Service: General;  Laterality: N/A;   COLONOSCOPY     ERCP N/A 07/29/2012   Procedure: ENDOSCOPIC RETROGRADE CHOLANGIOPANCREATOGRAPHY (ERCP);  Surgeon: Belvie JONETTA Just, MD;  Location: [facility];  Service: Endoscopy;  Laterality: N/A;  Dr. Just said he would start this PT arond 1330( AW)   INCISION AND DRAINAGE ABSCESS Right 12/02/2015   Procedure: INCISION AND DRAINAGE ABSCESS;  Surgeon: Lynwood Pina, MD;  Location: [facility] OR;  Service: General;  Laterality: Right;  Right axillary abscess   TUBAL LIGATION  1995    Allergies  Allergen Reactions   Amlodipine  Swelling    Leg edema   Tessalon  [Benzonatate ] Other (See Comments)    Lip swelling    BP 118/73   Ht 5' 4 (1.626 m)   Wt 229 lb (103.9 kg)   BMI 39.31 kg/m       No data to display              No data to display              Objective:  Physical Exam:  Gen: NAD, comfortable in exam room  Location: Left shoulder - Inspection: No visible deformity or edema - Palpation: No anterior bicep tendon TTP, no tenderness along scapula, no bony step-offs - ROM: Full passive ROM with internal rotation, external rotation, abduction, adduction; painful arc at top of abduction range - Strength: 5/5 with internal/external rotation as well as abduction/adduction, some pain with resistance - Special Tests: Positive Hawkins, negative empty can, negative Yergason's - Neurovascular: 2+ radial pulses distally, well perfused and intact sensation  Assessment and Plan:  Left shoulder impingement: Impingement with positive Hawkins, no evidence of rotator cuff tear given completely normal strength with resistance testing.  No evidence of radiculopathy to suggest cervical spinal pathology or nerve impingement. - Meloxicam  15 mg daily x 7 days, then as needed - Acetaminophen  as needed - Call back to schedule appointment if desired subacromial injection, discussed risks and benefits - Home exercise program scapular stabilization, handout and teaching provided - Formal PT possible if home exercise program not satisfactory - Follow-up in 6 weeks or sooner if needed

## 2024-03-31 ENCOUNTER — Encounter: Payer: Self-pay | Admitting: Family Medicine

## 2024-04-02 ENCOUNTER — Encounter (HOSPITAL_COMMUNITY): Payer: Self-pay

## 2024-04-06 ENCOUNTER — Ambulatory Visit (HOSPITAL_COMMUNITY): Admission: RE | Admit: 2024-04-06 | Discharge: 2024-04-06 | Attending: Student

## 2024-04-06 ENCOUNTER — Other Ambulatory Visit: Payer: Self-pay | Admitting: Cardiology

## 2024-04-06 ENCOUNTER — Ambulatory Visit (HOSPITAL_COMMUNITY)
Admission: RE | Admit: 2024-04-06 | Discharge: 2024-04-06 | Disposition: A | Source: Ambulatory Visit | Attending: Cardiology

## 2024-04-06 DIAGNOSIS — I2510 Atherosclerotic heart disease of native coronary artery without angina pectoris: Secondary | ICD-10-CM | POA: Diagnosis not present

## 2024-04-06 DIAGNOSIS — R072 Precordial pain: Secondary | ICD-10-CM

## 2024-04-06 DIAGNOSIS — R931 Abnormal findings on diagnostic imaging of heart and coronary circulation: Secondary | ICD-10-CM

## 2024-04-06 MED ORDER — NITROGLYCERIN 0.4 MG SL SUBL
0.8000 mg | SUBLINGUAL_TABLET | Freq: Once | SUBLINGUAL | Status: AC
  Administered 2024-04-06: 0.8 mg via SUBLINGUAL

## 2024-04-06 MED ORDER — IOHEXOL 350 MG/ML SOLN
100.0000 mL | Freq: Once | INTRAVENOUS | Status: AC | PRN
  Administered 2024-04-06: 100 mL via INTRAVENOUS

## 2024-04-07 ENCOUNTER — Ambulatory Visit: Payer: Self-pay | Admitting: Student

## 2024-04-09 ENCOUNTER — Ambulatory Visit: Payer: PRIVATE HEALTH INSURANCE | Admitting: Licensed Clinical Social Worker

## 2024-04-09 DIAGNOSIS — F331 Major depressive disorder, recurrent, moderate: Secondary | ICD-10-CM

## 2024-04-09 DIAGNOSIS — F411 Generalized anxiety disorder: Secondary | ICD-10-CM

## 2024-04-09 NOTE — Progress Notes (Addendum)
 [facility] Counselor/Therapist Progress Note  Patient ID: Veronica [name], MRN: [id number]    Date: 04/09/2024  Time Spent: 0301  pm - 0400 pm : 59 Minutes  Treatment Type: Individual Therapy and update treatment plan  Symptoms of anxiety and depression related to work stress and marriage. Most recently patient reports her father passed unexpectedly.    Mental Status Exam: Appearance:  Casual     Behavior: Appropriate  Motor: Normal  Speech/Language:  Clear and Coherent  Affect: Appropriate  Mood: normal  Thought process: normal  Thought content:   WNL  Sensory/Perceptual disturbances:   WNL  Orientation: oriented to person, place, time/date, situation, day of week, month of year, and year  Attention: Good  Concentration: Good  Memory: WNL  Fund of knowledge:  Good  Insight:   Good  Judgment:  Good  Impulse Control: Good    Risk Assessment: Danger to Self:  No Self-injurious Behavior: No Danger to Others: No Duty to Warn:no Physical Aggression / Violence:No  Access to Firearms a concern: No  Gang Involvement:No    Subjective:    Veronica [name] participated from office, located at [employer] with Clinician present. Veronica [name] consented to treatment.   Veronica [name] presented for her session stating that she is in a bad place. She states that her father passed and she doesn't feel she has even grieved his passing. She states that she has been stressed at home with finances and her husbands lack of concern over their situation. She states that she hasn't worked since he quit his job in early summer. She states that she  has been carrying the financial burden and he continues to take money to drink, get high and lay around. Patient states she feels she is in a situation and she is uncertain of how to move forward. Patient states that she has told him things have to improve or she will leave and sell the  house. His response was concern for where he will live.    Clinician actively listened and processed with patient her concerns. Clinician validated patients concerns and feelings. Clinician discussed with patient the emotional and physical toll that her husbands lack of support is taking on her. We role played a variety of conversations that could be ideal in assisting in communicating with her spouse.   Veronica [name] was fully engaged in discussion during session and is motivated for treatment. Veronica [name] will utilize coping skills to decrease symptoms of anxiety and depression. atient is to use CBT, mindfulness and coping skills to help manage decrease symptoms associated with their diagnosis.Veronica [name] will reduce overall level, frequency, and intensity of the feelings of depression/grief and anxiety. Veronica [name] will continue to engage in bi weekly therapy. Treatment planning to be reviewed by 04/09/2025.     Interventions: Cognitive Behavioral Therapy, Dialectical Behavioral Therapy, Assertiveness/Communication, Mindfulness Meditation, Motivational Interviewing, and Solution-Oriented/Positive Psychology, grief.   Diagnosis: Generalized Anxiety Disorder, Major Depressive Disorder, recurrent moderate   Treatment Plan:    Client Abilities/Strengths: I am very helpful, organized, I enjoy doing things with my family.     Support System: Sister-Angela   Client Treatment Preferences Cognitive Behavioral Therapy   Client Statement of Needs I want to not be depressed and not be emotional.     Treatment Level Every other week   Symptoms: Depression and anxiety   Goals: Improve coping skills to deal with depression and anxiety   Target Date: 04/09/2025 Frequency:bi weekly  Progress: 0 Modality: individual  Therapist will provide referrals for additional resources as appropriate.  Therapist will provide psycho-education regarding anxiety and depression related to her  diagnosis and corresponding treatment approaches and interventions. Licensed Clinical  Social Worker, Damien Junk, LCSW will support the patient's ability to achieve the goals identified. will employ CBT, BA, Problem-solving, Solution Focused, Mindfulness,  coping skills, & other evidenced-based practices will be used to promote progress towards healthy functioning to help manage decrease symptoms associated with her diagnosis.   Reduce overall level, frequency, and intensity of the feelings of depression, anxiety and panic evidenced by decreased from 6 to 7 days/week to 0 to 1 days/week per client report for at least 3 consecutive months. Verbally express understanding of the relationship between feelings of depression, anxiety and their impact on thinking patterns and behaviors. Verbalize an understanding of the role that distorted thinking plays in creating fears, excessive worry, and ruminations.             Veronica [name] participated in the creation of the treatment plan.   Damien Junk MSW, LCSW 04/09/2024   _________________________________________

## 2024-04-12 NOTE — Progress Notes (Unsigned)
 SUBJECTIVE: Discussed the use of AI scribe software for clinical note transcription with the patient, who gave verbal consent to proceed.  Chief Complaint: Obesity  Interim History: She is up 1 lb since her last visit.  Down 8 lbs overall   Veronica [name] is here to discuss her progress with her obesity treatment plan. She is on the Category 1 Plan and states she is following her eating plan approximately 25 % of the time. She states she is exercising walking 30 minutes 3 times per week.  Veronica [name] is a 63 year old female with obesity who presents for follow-up of her obesity treatment plan.  She has experienced a slight weight gain of one pound since her last visit, which she attributes to holiday indulgence. She finds it challenging to lose weight during the holiday season and is trying her best. She occasionally skips lunch due to lack of appetite and a busy work schedule involving multiple jobs. She is considering incorporating protein shakes into her diet to maintain muscle mass and meet her protein needs.  She has a history of prediabetes and is currently taking metformin  500 mg twice daily, with no issues reported. Her hypertension is managed with indapamide  2.5 mg daily, losartan  100 mg daily, and spironolactone  25 mg daily. She manages hyperlipidemia with atorvastatin  10 mg daily without any reported issues.  She has systemic lupus erythematosus and is on hydroxychloroquine  200 mg twice daily, with no current issues. She has follow up with Rheumatology soon. She experiences knee pain, which she attributes to her weight, and hopes that weight loss will alleviate this symptom.  She has a history of vitamin D  deficiency and requires a refill for her vitamin D  supplement. OBJECTIVE: Visit Diagnoses: Problem List Items Addressed This Visit     Hyperlipidemia   Prediabetes - Primary   Vitamin D  deficiency   Relevant Medications   Vitamin D , Ergocalciferol , (DRISDOL ) 1.25 MG (50000  UNIT) CAPS capsule   Essential hypertension, benign (Chronic)   SLE (systemic lupus erythematosus) (HCC) (Chronic)   Other Visit Diagnoses       Insulin  resistance         Obesity with starting BMI of 40.5         BMI 39.0-39.9,adult Current BMI 39.2          Obesity Management is ongoing with a focus on adipose mass loss and muscle maintenance. She has gained 0.4 pounds adipose mass since the last visit. Skipping meals, particularly lunch, due to a busy schedule. Emphasis on maintaining muscle mass to prevent energy storage as fat. - Encouraged consistent meal intake and protein consumption. - Recommended protein shakes to supplement protein intake rather than completely skipping, aiming for 75 grams of protein daily. - Advised on the importance of maintaining muscle mass to aid in weight loss.  Prediabetes with insulin  resistance Managed with metformin  500 mg twice daily. No current issues reported with medication.no Gi upset.  Lab Results  Component Value Date   HGBA1C 6.0 (H) 10/29/2023   HGBA1C 5.8 (H) 06/06/2023   HGBA1C 6.2 (H) 02/28/2023   Lab Results  Component Value Date   MICROALBUR 1.27 11/28/2009   LDLCALC 79 02/17/2024   CREATININE 0.94 02/17/2024   INSULIN   Date Value Ref Range Status  02/28/2023 27.6 (H) 2.6 - 24.9 uIU/mL Final  ]Continue working on nutrition plan to decrease simple carbohydrates, increase lean proteins and exercise to promote weight loss, improve glycemic control and prevent progression to Type 2 diabetes.  -  Continue metformin  500 mg twice daily.- no refills needed.   Hypertension Well-controlled with current medication regimen. Lozol  2.5 mg daily, losartan  100 mg daily, spironolactone  25 mg daily.  Blood pressure reading today was 119/70 mmHg. Weight loss is anticipated to further aid in blood pressure management and potentially reduce medication needs. BP Readings from Last 3 Encounters:  04/13/24 119/70  04/06/24 (!) 154/82  03/30/24  118/73   Lab Results  Component Value Date   NA 139 02/17/2024   CL 102 02/17/2024   K 3.8 02/17/2024   CO2 27 02/17/2024   BUN 13 02/17/2024   CREATININE 0.94 02/17/2024   EGFR 69 02/17/2024   CALCIUM  9.9 02/17/2024   ALBUMIN 4.1 07/04/2023   GLUCOSE 91 02/17/2024   Continue to work on nutrition plan to promote weight loss and improve BP control.  - Continue current antihypertensive medications: indapamide  2.5 mg daily, losartan  100 mg daily, and spironolactone  25 mg daily. - Encouraged weight loss to potentially reduce medication needs.  Hyperlipidemia Managed with atorvastatin  10 mg daily. No current issues reported with medication. Lab Results  Component Value Date   CHOL 141 02/17/2024   CHOL 153 02/28/2023   CHOL 122 04/09/2022   Lab Results  Component Value Date   HDL 47 02/17/2024   HDL 54 02/28/2023   HDL 40 04/09/2022   Lab Results  Component Value Date   LDLCALC 79 02/17/2024   LDLCALC 87 02/28/2023   LDLCALC 62 04/09/2022   Lab Results  Component Value Date   TRIG 75 02/17/2024   TRIG 60 02/28/2023   TRIG 111 04/09/2022   Lab Results  Component Value Date   CHOLHDL 3.0 02/17/2024   CHOLHDL 3.1 04/09/2022   CHOLHDL 2.8 04/07/2021   No results found for: LDLDIRECT LDL at goal. HDL just above goal. Trig at goal. Continue to work on nutrition plan -decreasing simple carbohydrates, increasing lean proteins, decreasing saturated fats and cholesterol , avoiding trans fats and exercise as able to promote weight loss, improve lipids and decrease cardiovascular risks. - Continue atorvastatin  10 mg daily.  Systemic lupus erythematosus Managed with hydroxychloroquine  200 mg daily. No current issues reported with medication. Discussion with lupus specialist in January regarding potential discontinuation of hydroxychloroquine  due to reduced lupus activity per patient's reportDr. . - Continue hydroxychloroquine  200 mg daily. - Follow up with Dr, Dolphus  Rheumatology specialist in January to discuss potential medication adjustments.  Vitamin D  Deficiency Vitamin D  is at goal of 50.  Most recent vitamin D  level was 65.4. She is on  prescription ergocalciferol  50,000 IU weekly. No N/V or muscle weakness with Ergocalciferol .  Lab Results  Component Value Date   VD25OH 65.4 10/29/2023   VD25OH 65.8 06/06/2023   VD25OH 34 01/11/2023    Plan: Continue and refill  prescription ergocalciferol  50,000 IU weekly Low vitamin D  levels can be associated with adiposity and may result in leptin resistance and weight gain. Also associated with fatigue.  Currently on vitamin D  supplementation without any adverse effects such as nausea, vomiting or muscle weakness.  Recheck level over the next 1-2 months to optimize supplementation  Meds ordered this encounter  Medications   Vitamin D , Ergocalciferol , (DRISDOL ) 1.25 MG (50000 UNIT) CAPS capsule    Sig: Take 1 capsule (50,000 Units total) by mouth every 7 (seven) days.    Dispense:  5 capsule    Refill:  0    Vitals Temp: 98 F (36.7 C) BP: 119/70 Pulse Rate: 74 SpO2: 99 %  Anthropometric Measurements Height: 5' 4 (1.626 m) Weight: 228 lb (103.4 kg) BMI (Calculated): 39.12 Weight at Last Visit: 227 lb Weight Lost Since Last Visit: 0 Weight Gained Since Last Visit: 1 lb Starting Weight: 236 lb Total Weight Loss (lbs): 8 lb (3.629 kg) Peak Weight: 236 lb   Body Composition  Body Fat %: 47 % Fat Mass (lbs): 107.2 lbs Muscle Mass (lbs): 114.8 lbs Total Body Water (lbs): 78.6 lbs Visceral Fat Rating : 15   Other Clinical Data Fasting: No Labs: No Today's Visit #: 18 Starting Date: 02/28/23     ASSESSMENT AND PLAN:  Diet: Veronica [name] is currently in the action stage of change. As such, her goal is to continue with weight loss efforts. She has agreed to Category 1 Plan.  Exercise: Veronica [name] has been instructed to work up to a goal of 150 minutes of combined cardio and strengthening  exercise per week for weight loss and overall health benefits.   Behavior Modification:  We discussed the following Behavioral Modification Strategies today: increasing lean protein intake, decreasing simple carbohydrates, increasing vegetables, increase H2O intake, increase high fiber foods, no skipping meals, meal planning and cooking strategies, holiday eating strategies, avoiding temptations, and planning for success. We discussed various medication options to help [facility] with her weight loss efforts and we both agreed to continue metformin  for primary indication of prediabetes.  Return in about 5 weeks (around 05/18/2024).Veronica [name] She was informed of the importance of frequent follow up visits to maximize her success with intensive lifestyle modifications for her multiple health conditions.  Attestation Statements:   Reviewed by clinician on day of visit: allergies, medications, problem list, medical history, surgical history, family history, social history, and previous encounter notes.   Time spent on visit including pre-visit chart review and post-visit care and charting was 43 minutes.    Veronica Linzy, PA-C

## 2024-04-13 ENCOUNTER — Ambulatory Visit (INDEPENDENT_AMBULATORY_CARE_PROVIDER_SITE_OTHER): Admitting: Physician Assistant

## 2024-04-13 ENCOUNTER — Encounter (INDEPENDENT_AMBULATORY_CARE_PROVIDER_SITE_OTHER): Payer: Self-pay | Admitting: Physician Assistant

## 2024-04-13 VITALS — BP 119/70 | HR 74 | Temp 98.0°F | Ht 64.0 in | Wt 228.0 lb

## 2024-04-13 DIAGNOSIS — E559 Vitamin D deficiency, unspecified: Secondary | ICD-10-CM

## 2024-04-13 DIAGNOSIS — G43009 Migraine without aura, not intractable, without status migrainosus: Secondary | ICD-10-CM

## 2024-04-13 DIAGNOSIS — I10 Essential (primary) hypertension: Secondary | ICD-10-CM

## 2024-04-13 DIAGNOSIS — M3219 Other organ or system involvement in systemic lupus erythematosus: Secondary | ICD-10-CM

## 2024-04-13 DIAGNOSIS — E88819 Insulin resistance, unspecified: Secondary | ICD-10-CM

## 2024-04-13 DIAGNOSIS — Z6839 Body mass index (BMI) 39.0-39.9, adult: Secondary | ICD-10-CM

## 2024-04-13 DIAGNOSIS — E669 Obesity, unspecified: Secondary | ICD-10-CM

## 2024-04-13 DIAGNOSIS — R7303 Prediabetes: Secondary | ICD-10-CM

## 2024-04-13 DIAGNOSIS — E785 Hyperlipidemia, unspecified: Secondary | ICD-10-CM

## 2024-04-13 DIAGNOSIS — M329 Systemic lupus erythematosus, unspecified: Secondary | ICD-10-CM

## 2024-04-13 MED ORDER — VITAMIN D (ERGOCALCIFEROL) 1.25 MG (50000 UNIT) PO CAPS: 50000.0000 [IU] | ORAL_CAPSULE | ORAL | 0 refills | Status: AC

## 2024-04-14 ENCOUNTER — Encounter: Payer: Self-pay | Admitting: Physician Assistant

## 2024-04-14 ENCOUNTER — Ambulatory Visit: Admitting: Physician Assistant

## 2024-04-14 VITALS — BP 105/67

## 2024-04-14 DIAGNOSIS — L918 Other hypertrophic disorders of the skin: Secondary | ICD-10-CM

## 2024-04-14 NOTE — Patient Instructions (Signed)

## 2024-04-14 NOTE — Progress Notes (Signed)
° °  New Patient Visit   Subjective  Veronica [name] is a 63 y.o. female NEW PATIENT who presents for the following: Skin tags of neck and left chest. They seem to be growing . The ones on her neck get irritated by her necklaces.    The following portions of the chart were reviewed this encounter and updated as appropriate: medications, allergies, medical history  Review of Systems:  No other skin or systemic complaints except as noted in HPI or Assessment and Plan.  Objective  Well appearing patient in no apparent distress; mood and affect are within normal limits.   A focused examination was performed of the following areas: Neck, chest   Relevant exam findings are noted in the Assessment and Plan.    Assessment & Plan     Acrochordons (Skin Tags) - Fleshy, skin-colored pedunculated papules - Benign appearing.  - Observe. - If desired, they can be removed with an in office procedure that is not covered by insurance. Advised patient fee is $200/15 tags. - Please call the clinic if you notice any new or changing lesions.   ACROCHORDON    Return if symptoms worsen or fail to improve.  I, Roseline Hutchinson, CMA, am acting as scribe for Reginald Mangels K, PA-C .   Documentation: I have reviewed the above documentation for accuracy and completeness, and I agree with the above.  Veronica Currington K, PA-C

## 2024-04-27 NOTE — Progress Notes (Deleted)
 "  Office Visit Note  Patient: Veronica [name]             Date of Birth: December 09, 1960           MRN: [id number]             PCP: Delores Suzann HERO, MD Referring: Delores Suzann HERO, MD Visit Date: 05/06/2024 Occupation: Data Unavailable  Subjective:  No chief complaint on file.   History of Present Illness: Veronica [name] is a 63 y.o. female ***     Activities of Daily Living:  Patient reports morning stiffness for *** {minute/hour:19697}.   Patient {ACTIONS;DENIES/REPORTS:21021675::Denies} nocturnal pain.  Difficulty dressing/grooming: {ACTIONS;DENIES/REPORTS:21021675::Denies} Difficulty climbing stairs: {ACTIONS;DENIES/REPORTS:21021675::Denies} Difficulty getting out of chair: {ACTIONS;DENIES/REPORTS:21021675::Denies} Difficulty using hands for taps, buttons, cutlery, and/or writing: {ACTIONS;DENIES/REPORTS:21021675::Denies}  No Rheumatology ROS completed.   PMFS History:  Patient Active Problem List   Diagnosis Date Noted   Vitamin D  deficiency 03/14/2023   OSA (obstructive sleep apnea) 02/28/2023   Major depressive disorder, recurrent episode, moderate (HCC) 01/08/2023   Class 2 severe obesity due to excess calories with serious comorbidity and body mass index (BMI) of 39.0 to 39.9 in adult 12/20/2022   Esophageal dysphagia 04/15/2020   Mood disorder 11/13/2019   Atherosclerosis of aorta 07/15/2019   Prediabetes 07/13/2019   Chronic abdominal pain 07/13/2019   Generalized anxiety disorder 05/15/2019   Hyperlipidemia 06/14/2015   Insomnia 12/15/2012   Essential hypertension, benign 04/10/2012   SLE (systemic lupus erythematosus) (HCC) 04/10/2012    Past Medical History:  Diagnosis Date   Anxiety    Bilateral swelling of feet    Chest pain    Chewing difficulty    Constipation    Daily headache    Depression    Diverticulitis    Fibromyalgia    G6PD deficiency    outside labs--low level, no longer on HCQ   Glucose 6 phosphatase deficiency (HCC)     High cholesterol    History of cholelithiasis    History of colon polyps    Hypertension    Joint pain    Mini stroke    Osteoarthritis    Prediabetes    Rheumatoid arthritis (HCC)    SLE (systemic lupus erythematosus) (HCC) 1998   SOB (shortness of breath)    Swallowing difficulty     Family History  Problem Relation Age of Onset   Hypertension Mother    Diabetes Mother    Bladder Cancer Mother    Sleep apnea Mother    Hypertension Father    Kidney disease Father    Diabetes Sister    Multiple sclerosis Sister    Diabetes Brother    Hypertension Brother    Healthy Son    Healthy Son    Past Surgical History:  Procedure Laterality Date   ABDOMINAL HYSTERECTOMY  09/2008   CESAREAN SECTION  1992, 1995   CHOLECYSTECTOMY N/A 07/18/2012   Procedure: LAPAROSCOPIC CHOLECYSTECTOMY WITH INTRAOPERATIVE CHOLANGIOGRAM;  Surgeon: Camellia HERO Blush, MD;  Location: [facility] OR;  Service: General;  Laterality: N/A;   COLONOSCOPY     ERCP N/A 07/29/2012   Procedure: ENDOSCOPIC RETROGRADE CHOLANGIOPANCREATOGRAPHY (ERCP);  Surgeon: Belvie JONETTA Just, MD;  Location: [facility];  Service: Endoscopy;  Laterality: N/A;  Dr. Just said he would start this PT arond 1330( AW)   INCISION AND DRAINAGE ABSCESS Right 12/02/2015   Procedure: INCISION AND DRAINAGE ABSCESS;  Surgeon: Lynwood Pina, MD;  Location: [facility] OR;  Service: General;  Laterality: Right;  Right axillary  abscess   TUBAL LIGATION  1995   Social History[1] Social History   Social History Narrative   Pt lives with spouse. Some college.    Works 7 days on then 7 days off at place of work       Patient is right-handed. She lives with her husband in a one level home. She occasionally drinks soda. She does not exercise.      Immunization History  Administered Date(s) Administered   Influenza, Seasonal, Injecte, Preservative Fre 04/12/2023   Influenza,inj,Quad PF,6+ Mos 01/09/2013, 02/21/2015, 02/07/2016, 01/09/2017, 02/18/2018, 01/07/2019,  01/25/2020, 01/06/2021, 01/16/2022   Influenza-Unspecified 01/09/2013, 02/21/2015, 02/07/2016, 01/09/2017, 02/18/2018   PFIZER(Purple Top)SARS-COV-2 Vaccination 05/11/2019, 06/05/2019, 02/13/2020   PNEUMOCOCCAL CONJUGATE-20 01/30/2021   PPD Test 09/02/2012, 11/03/2013, 10/08/2014, 11/28/2015, 02/25/2017, 02/18/2018, 01/07/2019, 01/25/2020, 01/30/2021, 04/09/2022, 08/05/2023   Pfizer Covid-19 Vaccine Bivalent Booster 48yrs & up 05/22/2021   Pfizer(Comirnaty)Fall Seasonal Vaccine 12 years and older 04/09/2022, 04/12/2023   Tdap 09/02/2012     Objective: Vital Signs: There were no vitals taken for this visit.   Physical Exam   Musculoskeletal Exam: ***  CDAI Exam: CDAI Score: -- Patient Global: --; Provider Global: -- Swollen: --; Tender: -- Joint Exam 05/06/2024   No joint exam has been documented for this visit   There is currently no information documented on the homunculus. Go to the Rheumatology activity and complete the homunculus joint exam.  Investigation: No additional findings.  Imaging: CT CORONARY MORPH W/CTA COR W/SCORE W/CA W/CM &/OR WO/CM Addendum Date: 04/14/2024 ADDENDUM REPORT: 04/14/2024 00:02 EXAM: OVER-READ INTERPRETATION  CT CHEST The following report is an over-read performed by radiologist Dr. Oneil Devonshire of [facility] on 04/14/2024. This over-read does not include interpretation of cardiac or coronary anatomy or pathology. The coronary calcium  score/coronary CTA interpretation by the cardiologist is attached. COMPARISON:  08/07/2023 FINDINGS: Cardiovascular: Scattered atherosclerotic calcification is noted without aneurysmal dilatation. No dissection is noted. No pulmonary emboli are seen. Mediastinum/Nodes: There are no enlarged lymph nodes within the visualized mediastinum. Lungs/Pleura: There is no pleural effusion. The visualized lungs appear clear. Upper abdomen: No significant findings in the visualized upper abdomen. Musculoskeletal/Chest  wall: No chest wall mass or suspicious osseous findings within the visualized chest. IMPRESSION: Aortic Atherosclerosis (ICD10-I70.0). Electronically Signed   By: Oneil Devonshire M.D.   On: 04/14/2024 00:02   Result Date: 04/14/2024 HISTORY: Chest pain, nonspecific EXAM: Cardiac/Coronary  CT PROTOCOL: A non-contrast, gated CT scan was obtained with axial slices of 2.5 mm through the heart for calcium  scoring. Calcium  scoring was performed using the Agatston method. A 120 kV prospective, gated, contrast cardiac CT scan was obtained. Gantry rotation speed was 230 msec and collimation was 0.63 mm. Two sublingual nitroglycerin  tablets (0.8 mg) were given. The 3D data set was reconstructed with motion correction for the best systolic or diastolic phase. Images were analyzed on a dedicated workstation using MPR, MIP, and VRT modes. The patient received 95 cc of contrast. FINDINGS: Image quality: Average. Artifact: Single to noise and misregistration Coronary calcium  score is 230, which places the patient in the 95th percentile for age and sex matched control. Coronary arteries: Normal coronary origins.  Right dominance. Left Main Coronary Artery: Trifurcates into left anterior descending artery (LAD), left circumflex artery (LCX), and ramus intermedius (RI). Left main is patent. Left Anterior Descending Artery: Normal caliber vessel, reaches the apex, gives off 2 diagonal branches. Minimal calcified plaque (<24%) at the ostium, Mild mixed plaque (25-49%) at proximal to mid LAD, mid to apical  segments patent. First Diagonal branch: Moderate stenosis (50-69%) at the proximal D1, otherwise patent Second Diagonal branch: Patent Ramus intermedius: Normal caliber and size, patent. Left Circumflex Artery: Patent. Right Coronary Artery: RCA is patent with minimal luminal irregularities due to soft plaque. Aorta: Normal size, 36 mm at the mid ascending aorta (level of the PA bifurcation) measured double oblique. Aortic  atherosclerosis. Aortic Valve: Native valve, trileaflet aortic valve, no significant calcification. Mitral valve: Native valve, no significant mitral annular calcification. Other findings: Normal pulmonary vein drainage into the left atrium. Normal left atrial appendage without thrombus. Dilatation of main pulmonary artery, 32 mm, may indicate increased pulmonary pressures. Please see separate report from [facility] for non-cardiac findings. IMPRESSION: 1. Coronary calcium  score of 230. This was 95th percentile for age and sex matched control. 2. Normal coronary origins with right dominance. 3. CAD-RADS 3 Moderate non-obstructive CAD. 4. CT FFR will be performed and reported separately to evaluate D1. 5. Aortic atherosclerosis. 6. Dilatation of main pulmonary artery, 32 mm, may indicate increased pulmonary pressures. RECOMMENDATION: Consider symptom-guided anti-ischemic pharmacotherapy as well as risk factor modification per guideline directed care. Additional analysis with CT FFR will be submitted. Electronically Signed: By: Madonna Large On: 04/06/2024 21:41   CT CORONARY FFR DATA PREP & FLUID ANALYSIS Result Date: 04/06/2024 EXAM: CT FFR ANALYSIS CLINICAL DATA:  Chest pain, nonspecific FINDINGS: FFRct analysis was performed on the original cardiac CT angiogram dataset. Diagrammatic representation of the FFRct analysis is provided in a separate PDF document in PACS. This dictation was created using the PDF document and an interactive 3D model of the results. 3D model is not available in the EMR/PACS. Normal FFR range is >0.80. Indeterminate (grey) zone is 0.76-0.80. FFR delta of 0.13 is considered significant. 1. Left Main: FFR = 1.0 2. LAD: Proximal FFR = 0.99, mid FFR = 0.96, Distal FFR = 0.91 3. D1: Proximal FFR = 0.96, distal FFR = 0.86 4. D2: Not modeled. 5. RI: Proximal FFR = 0.98, Distal FFR = 0.91 6. LCX: Proximal FFR = 0.99, mid FFR = 0.88, distal FFR = 0.83 7. RCA: Proximal FFR = 0.98, mid  FFR =0.90, Distal FFR = 0.82 IMPRESSION: 1.  CT FFR analysis showed no significant stenosis. RECOMMENDATIONS: Guideline-directed medical therapy and aggressive risk factor modification for secondary prevention of coronary artery disease. Electronically Signed   By: Madonna Large   On: 04/06/2024 21:58    Recent Labs: Lab Results  Component Value Date   WBC 4.5 02/17/2024   HGB 12.7 02/17/2024   PLT 367 02/17/2024   NA 139 02/17/2024   K 3.8 02/17/2024   CL 102 02/17/2024   CO2 27 02/17/2024   GLUCOSE 91 02/17/2024   BUN 13 02/17/2024   CREATININE 0.94 02/17/2024   BILITOT 0.5 11/06/2023   ALKPHOS 63 07/04/2023   AST 22 11/06/2023   ALT 15 11/06/2023   PROT 7.6 11/06/2023   ALBUMIN 4.1 07/04/2023   CALCIUM  9.9 02/17/2024   GFRAA 74 04/15/2020    Speciality Comments: PLQ Eye Exam: 02/20/2023 WNL @ [facility] Follow up in 1 year  Plaquenil  since 2000, methotrexate -GI side effects and fatigue  Procedures:  No procedures performed Allergies: Amlodipine  and Tessalon  [benzonatate ]   Assessment / Plan:     Visit Diagnoses: No diagnosis found.  Orders: No orders of the defined types were placed in this encounter.  No orders of the defined types were placed in this encounter.   Face-to-face time spent with patient  was *** minutes. Greater than 50% of time was spent in counseling and coordination of care.  Follow-Up Instructions: No follow-ups on file.   Daved JAYSON Gavel, CMA  Note - This record has been created using Animal nutritionist.  Chart creation errors have been sought, but may not always  have been located. Such creation errors do not reflect on  the standard of medical care.    [1]  Social History Tobacco Use   Smoking status: Never    Passive exposure: Current   Smokeless tobacco: Never  Vaping Use   Vaping status: Never Used  Substance Use Topics   Alcohol use: No   Drug use: No   "

## 2024-05-02 NOTE — Progress Notes (Unsigned)
 "  Cardiology Office Note:    Date:  05/02/2024   ID:  Veronica [name], DOB 1961/03/05, MRN [id number]  PCP:  Delores Suzann HERO, MD  Cardiologist:  None Cardiology APP:  Cedrik Heindl E, PA-C { Click to update primary MD,subspecialty MD or APP then REFRESH:1}    Referring MD: Delores Suzann HERO, MD   Chief Complaint: follow-up of chest pain  History of Present Illness:    Veronica [name] is a 64 y.o. female with a history of moderate non-obstructive CAD noted on coronary CTA in 03/2024, moderate LVH, hypertension, hyperlipidemia, prediabetes, TIA in 11/2019, lupus, rheumatoid arthritis, fibromyalgia, G6PD deficiency, and anxiety/ depression who presents today for follow-up of chest pain.   Patient was seen in the ED in 11/2019 for possible TIA after presenting with left sided facial droop and slurred speech. Head CT and Brain MRI were unremarkable. She was subsequently seen by Neurology. Head/ neck CTA in 12/2019 showed no acute findings. Echo in 04/2020 showed LVEF of 60-65% with mild LVH, mild TR and mild PR. Monitor in 04/2020 showed rare PAC/s PVCs but no atrial fibrillation or any other significant arrhythmias. Repeat Echo in 05/2023 showed LVEF of 60-65% with moderate asymmetric LVH of the basal-septal segment and normal diastolic parameters, normal RV function, and no significant valvular disease.   She was referred to Cardiology and seen by me in the [facility] in 02/2024 at which time she reported a couple of weeks of intermittent chest pain. Coronary CTA was ordered and showed a coronary calcium  score of 230 (95th percentile for age and sex) and moderate non-obstructive CAD as well as dilatation of main pulmonary artery (measuring 32 mm) which may indicate increase pulmonary artery pressures. FFR was negative.   Patient presents today for follow-up. ***  Non-Obstructive CAD Recent coronary CTA in 03/2024 showed coronary calcium  score of 230 (95th percentile for age and sex) and  moderate non-obstructive CAD as well as dilatation of main pulmonary artery (measuring 32 mm) which may indicate increase pulmonary artery pressures.  - No recurrent chest pain. *** - Continue Aspirin  81mg  daily.  - Will increase Lipitor to 20mg  daily ***.  - In regards to dilatation of main pulmonary artery, ***  Moderate LVH Echo in 05/2023 showed LVEF of 60-65% with moderate asymmetric LVH of the basal septal segment but normal diastolic parameters. - No signs or symptoms of CHF. - Suspect this is likely due to hypertension and she states she was diagnosed with this in her 90s.  She denies any family history of cardiomyopathy or sudden cardiac death.   Hypertension BP well controlled. - Continue current medications: Losartan  100mg  daily and Spironolactone  25mg  daily.    Hyperlipidemia Lipid panel in 01/2024: Total Cholesterol 141, Triglycerides 75, HDL 47, LDL 79. LDL goal <70. - Currently on Lipitor 10mg  daily. Will increase to 20mg  daily to try to get LDL at goal. *** - Will repeat lipid panel and LFTs in 3 months. ***   Prediabetes Hemoglobin A1 6.0% in 10/2023.  - On Metformin .  - Management per PCP.    Obesity BMI 39.87.  - She is [facility] working with [facility].  EKGs/Labs/Other Studies Reviewed:    The following studies were reviewed:  Monitor 05/24/2020 to 06/05/2020: Patient had a min HR of 45 bpm, max HR of 107 bpm, and avg HR of 65 bpm. Predominant underlying rhythm was Sinus Rhythm.  Isolated SVEs were rare (<1.0%), SVE Couplets were rare (<1.0%),  and SVE Triplets were rare (<1.0%). Symptoms = 1 correlated with PAC. There were Isolated VEs were rare (<1.0%. No atrial fibrillation or significant heart block noted. _______________   Echocardiogram 05/16/2023: Impressions: 1. Left ventricular ejection fraction, by estimation, is 60 to 65%. The  left ventricle has normal function. The left ventricle has no regional  wall motion  abnormalities. There is moderate asymmetric left ventricular  hypertrophy of the basal-septal  segment. Left ventricular diastolic parameters were normal.   2. Right ventricular systolic function is normal. The right ventricular  size is normal. There is normal pulmonary artery systolic pressure. The  estimated right ventricular systolic pressure is 28.4 mmHg.   3. The mitral valve is normal in structure. No evidence of mitral valve  regurgitation. No evidence of mitral stenosis.   4. The aortic valve is tricuspid. Aortic valve regurgitation is not  visualized. No aortic stenosis is present.   5. The inferior vena cava is normal in size with greater than 50%  respiratory variability, suggesting right atrial pressure of 3 mmHg.  _______________  Coronary CTA 04/06/2024: Impressions: 1. Coronary calcium  score of 230. This was 95th percentile for age and sex matched control. 2. Normal coronary origins with right dominance. 3. CAD-RADS 3 Moderate non-obstructive CAD. 4. CT FFR will be performed and reported separately to evaluate D1. 5. Aortic atherosclerosis. 6. Dilatation of main pulmonary artery, 32 mm, may indicate increased pulmonary pressures.  FFR Summary: 1. CT FFR analysis showed no significant stenosis.   EKG:  EKG not ordered today.   Recent Labs: 11/06/2023: ALT 15 02/17/2024: BUN 13; Creatinine, Ser 0.94; Hemoglobin 12.7; Platelets 367; Potassium 3.8; Sodium 139  Recent Lipid Panel    Component Value Date/Time   CHOL 141 02/17/2024 1246   TRIG 75 02/17/2024 1246   HDL 47 02/17/2024 1246   CHOLHDL 3.0 02/17/2024 1246   CHOLHDL 4.4 06/13/2016 1436   VLDL 30 06/13/2016 1436   LDLCALC 79 02/17/2024 1246    Physical Exam:    Vital Signs: There were no vitals taken for this visit.    Wt Readings from Last 3 Encounters:  04/13/24 228 lb (103.4 kg)  03/30/24 229 lb (103.9 kg)  03/16/24 227 lb (103 kg)     General: 64 y.o. female in no acute distress. HEENT:  Normocephalic and atraumatic. Sclera clear.  Neck: Supple. No carotid bruits. No JVD. Heart: *** RRR. Distinct S1 and S2. No murmurs, gallops, or rubs.  Lungs: No increased work of breathing. Clear to ausculation bilaterally. No wheezes, rhonchi, or rales.  Abdomen: Soft, non-distended, and non-tender to palpation.  Extremities: No lower extremity edema.  Radial and distal pedal pulses 2+ and equal bilaterally. Skin: Warm and dry. Neuro: No focal deficits. Psych: Normal affect. Responds appropriately.   Assessment:    No diagnosis found.  Plan:     Disposition: Follow up in ***   Signed, Daleah Coulson E Misty Foutz, PA-C  05/02/2024 11:31 AM    [facility] "

## 2024-05-06 ENCOUNTER — Ambulatory Visit: Payer: PRIVATE HEALTH INSURANCE | Admitting: Rheumatology

## 2024-05-06 DIAGNOSIS — R1319 Other dysphagia: Secondary | ICD-10-CM

## 2024-05-06 DIAGNOSIS — R5383 Other fatigue: Secondary | ICD-10-CM

## 2024-05-06 DIAGNOSIS — E559 Vitamin D deficiency, unspecified: Secondary | ICD-10-CM

## 2024-05-06 DIAGNOSIS — I700 Atherosclerosis of aorta: Secondary | ICD-10-CM

## 2024-05-06 DIAGNOSIS — I10 Essential (primary) hypertension: Secondary | ICD-10-CM

## 2024-05-06 DIAGNOSIS — Z79899 Other long term (current) drug therapy: Secondary | ICD-10-CM

## 2024-05-06 DIAGNOSIS — Z820 Family history of epilepsy and other diseases of the nervous system: Secondary | ICD-10-CM

## 2024-05-06 DIAGNOSIS — F39 Unspecified mood [affective] disorder: Secondary | ICD-10-CM

## 2024-05-06 DIAGNOSIS — M19042 Primary osteoarthritis, left hand: Secondary | ICD-10-CM

## 2024-05-06 DIAGNOSIS — M797 Fibromyalgia: Secondary | ICD-10-CM

## 2024-05-06 DIAGNOSIS — G4709 Other insomnia: Secondary | ICD-10-CM

## 2024-05-06 DIAGNOSIS — G8929 Other chronic pain: Secondary | ICD-10-CM

## 2024-05-06 DIAGNOSIS — M7062 Trochanteric bursitis, left hip: Secondary | ICD-10-CM

## 2024-05-06 DIAGNOSIS — M3219 Other organ or system involvement in systemic lupus erythematosus: Secondary | ICD-10-CM

## 2024-05-06 DIAGNOSIS — R7303 Prediabetes: Secondary | ICD-10-CM

## 2024-05-06 DIAGNOSIS — G5601 Carpal tunnel syndrome, right upper limb: Secondary | ICD-10-CM

## 2024-05-06 DIAGNOSIS — D75A Glucose-6-phosphate dehydrogenase (G6PD) deficiency without anemia: Secondary | ICD-10-CM

## 2024-05-06 DIAGNOSIS — R091 Pleurisy: Secondary | ICD-10-CM

## 2024-05-06 DIAGNOSIS — M79671 Pain in right foot: Secondary | ICD-10-CM

## 2024-05-06 DIAGNOSIS — E785 Hyperlipidemia, unspecified: Secondary | ICD-10-CM

## 2024-05-06 DIAGNOSIS — R748 Abnormal levels of other serum enzymes: Secondary | ICD-10-CM

## 2024-05-11 ENCOUNTER — Ambulatory Visit (INDEPENDENT_AMBULATORY_CARE_PROVIDER_SITE_OTHER): Admitting: Family Medicine

## 2024-05-11 ENCOUNTER — Ambulatory Visit: Attending: Student | Admitting: Student

## 2024-05-11 VITALS — BP 138/78 | Ht 64.0 in | Wt 229.0 lb

## 2024-05-11 DIAGNOSIS — M25812 Other specified joint disorders, left shoulder: Secondary | ICD-10-CM | POA: Diagnosis not present

## 2024-05-11 NOTE — Progress Notes (Signed)
 "  PCP: Veronica Suzann HERO, MD  Patient is a 64 y.o. female here for L shoulder pain.  HPI - L shoulder pain that started in Nov 2025, she was seen at the beginning on Dec 2025 and prescribed Meloxicam  burst followed by as needed use. However, she only took it as needed.  - Pain still occurs with laying on the left, but pain is still occasional with movement and dull - Reaching up doesn't hurt as much after taking the Meloxicam  as needed - Denies any numbness/tingling down her arm - Home exercises performed 3x/week - In-home caregiver 7x/week  Past Medical History:  Diagnosis Date   Anxiety    Bilateral swelling of feet    Chest pain    Chewing difficulty    Constipation    Daily headache    Depression    Diverticulitis    Fibromyalgia    G6PD deficiency    outside labs--low level, no longer on HCQ   Glucose 6 phosphatase deficiency (HCC)    High cholesterol    History of cholelithiasis    History of colon polyps    Hypertension    Joint pain    Mini stroke    Osteoarthritis    Prediabetes    Rheumatoid arthritis (HCC)    SLE (systemic lupus erythematosus) (HCC) 1998   SOB (shortness of breath)    Swallowing difficulty     Medications Ordered Prior to Encounter[1]  Past Surgical History:  Procedure Laterality Date   ABDOMINAL HYSTERECTOMY  09/2008   CESAREAN SECTION  1992, 1995   CHOLECYSTECTOMY N/A 07/18/2012   Procedure: LAPAROSCOPIC CHOLECYSTECTOMY WITH INTRAOPERATIVE CHOLANGIOGRAM;  Surgeon: Camellia [name] Blush, MD;  Location: [facility] OR;  Service: General;  Laterality: N/A;   COLONOSCOPY     ERCP N/A 07/29/2012   Procedure: ENDOSCOPIC RETROGRADE CHOLANGIOPANCREATOGRAPHY (ERCP);  Surgeon: Belvie JONETTA Just, MD;  Location: [facility];  Service: Endoscopy;  Laterality: N/A;  Dr. Just said he would start this PT arond 1330( AW)   INCISION AND DRAINAGE ABSCESS Right 12/02/2015   Procedure: INCISION AND DRAINAGE ABSCESS;  Surgeon: Lynwood Pina, MD;  Location: [facility] OR;  Service:  General;  Laterality: Right;  Right axillary abscess   TUBAL LIGATION  1995    Allergies[2]  BP 138/78   Ht 5' 4 (1.626 m)   Wt 229 lb (103.9 kg)   BMI 39.31 kg/m       No data to display              No data to display              Objective:  Physical Exam:  Shoulder exam Gen: NAD, comfortable in exam room Inspection: No visible lesions, edema, erythema, overlying skin changes  Palpation: No TTP over AC joint or clavicle  ROM: Full passive ROM with flexion, extension, internal rotation external rotation, and abduction Special Tests: Negative Neer's, negative Hawkins, negative empty can, and negative full can Strength 5/5 rotator cuff strength NVI  Assessment and Plan:   Assessment & Plan Impingement of left shoulder Left shoulder impingement has improved with meloxicam  as needed.  Discussed continuing home exercises as recommended at least 3 times a week for about 4-6 more weeks and meloxicam  as needed. - If pain has improves, can continue home exercises and meloxicam  as needed - If symptoms worsen, advised returning to consider formal PT vs injections - Follow-up as needed  Tahlor Berenguer, DO [facility]     [1]  Current Outpatient Medications on File Prior to Visit  Medication Sig Dispense Refill   aspirin  EC 81 MG tablet Take 1 tablet (81 mg total) by mouth daily. Swallow whole. 30 tablet 11   atorvastatin  (LIPITOR) 10 MG tablet Take 1 tablet (10 mg total) by mouth daily. 90 tablet 3   cetirizine  (ZYRTEC ) 10 MG tablet Take 1 tablet (10 mg total) by mouth daily. 30 tablet 11   cyclobenzaprine  (FLEXERIL ) 5 MG tablet Take 1 tablet (5 mg total) by mouth 3 (three) times daily as needed for muscle spasms. 30 tablet 1   FLUoxetine  (PROZAC ) 20 MG capsule Take 1 capsule (20 mg total) by mouth daily. 90 capsule 3   hydroxychloroquine  (PLAQUENIL ) 200 MG tablet Take 1 tablet by mouth twice daily 180 tablet 0   indapamide  (LOZOL ) 2.5 MG tablet Take 1  tablet (2.5 mg total) by mouth daily. 90 tablet 2   losartan  (COZAAR ) 100 MG tablet Take 1 tablet (100 mg total) by mouth daily. 90 tablet 3   meloxicam  (MOBIC ) 15 MG tablet Take 1 tablet (15 mg total) by mouth daily. 30 tablet 1   metFORMIN  (GLUCOPHAGE ) 500 MG tablet Take 1 tablet (500 mg total) by mouth 2 (two) times daily with a meal. 60 tablet 1   nystatin  (MYCOSTATIN /NYSTOP ) powder Apply 1 Application topically 2 (two) times daily.     pantoprazole  (PROTONIX ) 40 MG tablet Take 1 tablet by mouth once daily 60 tablet 0   polyethylene glycol powder (GLYCOLAX /MIRALAX ) 17 GM/SCOOP powder Take 17 g by mouth 2 (two) times daily as needed. 3350 g 1   Prenatal Vit-Fe Fumarate-FA (PRENATAL VITAMINS PO) Take by mouth.     spironolactone  (ALDACTONE ) 25 MG tablet Take 1 tablet (25 mg total) by mouth daily. 90 tablet 3   topiramate  (TOPAMAX ) 50 MG tablet Take 1 tablet (50 mg total) by mouth at bedtime. 90 tablet 3   Vitamin D , Ergocalciferol , (DRISDOL ) 1.25 MG (50000 UNIT) CAPS capsule Take 1 capsule (50,000 Units total) by mouth every 7 (seven) days. 5 capsule 0   No current facility-administered medications on file prior to visit.  [2]  Allergies Allergen Reactions   Amlodipine  Swelling    Leg edema   Tessalon  [Benzonatate ] Other (See Comments)    Lip swelling   "

## 2024-05-13 ENCOUNTER — Ambulatory Visit: Payer: PRIVATE HEALTH INSURANCE | Admitting: Licensed Clinical Social Worker

## 2024-05-20 ENCOUNTER — Ambulatory Visit (INDEPENDENT_AMBULATORY_CARE_PROVIDER_SITE_OTHER): Admitting: Physician Assistant

## 2024-06-05 NOTE — Progress Notes (Unsigned)
" ° ° °  SUBJECTIVE:   Chief compliant/HPI: annual examination  Veronica [name] is a 64 y.o. who presents today for an annual exam.    History tabs reviewed and updated.   Review of systems form reviewed and notable for none.   OBJECTIVE:   There were no vitals taken for this visit.  HEENT: EOMI. Sclera without injection or icterus. MMM. External auditory canal examined and WNL. TM normal appearance, no erythema or bulging. Neck: Supple.  Cardiac: Regular rate and rhythm. Normal S1/S2. No murmurs, rubs, or gallops appreciated. Lungs: Clear bilaterally to ascultation.  Abdomen: Normoactive bowel sounds. No tenderness to deep or light palpation. No rebound or guarding.   Psych: Pleasant and appropriate    ASSESSMENT/PLAN:   Assessment & Plan Annual physical exam    Annual Examination  See AVS for age appropriate recommendations  PHQ score ***, reviewed and discussed.  BP reviewed and at goal ***.  Asked about intimate partner violence and resources given as appropriate  Advance directives discussion ***  Considered the following items based upon USPSTF recommendations: Diabetes screening: {FMCANNUALORDERED:33692} HIV testing:{FMCANNUALORDERED:33692} Hepatitis C: {FMCANNUALORDERED:33692} Hepatitis B:{FMCANNUALORDERED:33692} Syphilis if at high risk: {FMCANNUALORDERED:33692} GC/CT {GC/CT screening :23818} Lipid panel (nonfasting or fasting) discussed based upon AHA recommendations and {FMCLIPID:33694}.  Consider repeat every 4-6 years.  Reviewed risk factors for latent tuberculosis and {not indicated/requested/declined:14582} Osteoporosis screening considered based upon risk of fracture from [facility] calculator. Major osteoporotic fracture risk is ***%. DEXA {ordered not order:23822}.   Cancer Screening Discussion  Cervical cancer screening: {PAPTYPE:23819} Breast cancer screening: {FMCLIPID:33694} Discussed family history, BRCA testing {not  indicated/requested/declined:14582}. Tool used to risk stratify was ***.  Lung cancer screening:{FMCLUNGCANCERSCREENIN:33695}.  See documentation below regarding indications/risks/benefits.  Colorectal cancer screening: {crcscreen:23821::discussed, colonoscopy ordered}.  Vaccinations ***.   Follow up in 1 *** year or sooner if indicated.  MyChart Activation: {MYCHARTLIST:32522}  Suzann CHRISTELLA Daring, MD [facility]   "

## 2024-06-08 ENCOUNTER — Encounter: Admitting: Family Medicine

## 2024-06-08 DIAGNOSIS — Z0000 Encounter for general adult medical examination without abnormal findings: Secondary | ICD-10-CM

## 2024-06-15 ENCOUNTER — Ambulatory Visit (INDEPENDENT_AMBULATORY_CARE_PROVIDER_SITE_OTHER): Admitting: Physician Assistant

## 2024-06-29 ENCOUNTER — Ambulatory Visit: Admitting: Rheumatology

## 2024-10-28 ENCOUNTER — Ambulatory Visit: Payer: PRIVATE HEALTH INSURANCE | Admitting: Neurology
